# Patient Record
Sex: Male | Born: 1935 | Hispanic: Yes | Marital: Married | Smoking: Never smoker
Health system: Southern US, Community
[De-identification: ages and names within clinical notes are randomized; demographics above are authoritative.]

## PROBLEM LIST (undated history)

## (undated) DIAGNOSIS — I509 Heart failure, unspecified: Secondary | ICD-10-CM

## (undated) DIAGNOSIS — N189 Chronic kidney disease, unspecified: Secondary | ICD-10-CM

## (undated) DIAGNOSIS — E871 Hypo-osmolality and hyponatremia: Secondary | ICD-10-CM

## (undated) DIAGNOSIS — I10 Essential (primary) hypertension: Secondary | ICD-10-CM

## (undated) DIAGNOSIS — I639 Cerebral infarction, unspecified: Secondary | ICD-10-CM

## (undated) HISTORY — PX: CARDIAC SURGERY: SHX584

## (undated) HISTORY — PX: BRAIN SURGERY: SHX531

## (undated) HISTORY — DX: Heart failure, unspecified: I50.9

## (undated) HISTORY — DX: Chronic kidney disease, unspecified: N18.9

## (undated) HISTORY — DX: Hypo-osmolality and hyponatremia: E87.1

---

## 2004-12-16 ENCOUNTER — Emergency Department: Payer: Self-pay | Admitting: Emergency Medicine

## 2005-06-05 ENCOUNTER — Emergency Department: Payer: Self-pay | Admitting: Emergency Medicine

## 2007-07-04 ENCOUNTER — Inpatient Hospital Stay: Payer: Self-pay | Admitting: Internal Medicine

## 2007-07-04 ENCOUNTER — Other Ambulatory Visit: Payer: Self-pay

## 2007-08-23 ENCOUNTER — Encounter: Payer: Self-pay | Admitting: Nurse Practitioner

## 2007-09-06 ENCOUNTER — Encounter: Payer: Self-pay | Admitting: Nurse Practitioner

## 2007-10-07 ENCOUNTER — Encounter: Payer: Self-pay | Admitting: Nurse Practitioner

## 2008-01-18 ENCOUNTER — Inpatient Hospital Stay: Payer: Self-pay | Admitting: Specialist

## 2008-01-18 ENCOUNTER — Other Ambulatory Visit: Payer: Self-pay

## 2008-03-29 ENCOUNTER — Ambulatory Visit: Payer: Self-pay | Admitting: Vascular Surgery

## 2008-04-04 ENCOUNTER — Inpatient Hospital Stay: Payer: Self-pay | Admitting: Vascular Surgery

## 2008-09-30 IMAGING — CR DG CHEST 2V
1 series · 2 of 2 positions shown · non-contrast
Comparison: none

REASON FOR EXAM: Hypoxia
COMMENTS:

[Series 1: view not recorded · 0.17mm/px · 2 of 2 slices shown]
[im 1/2]
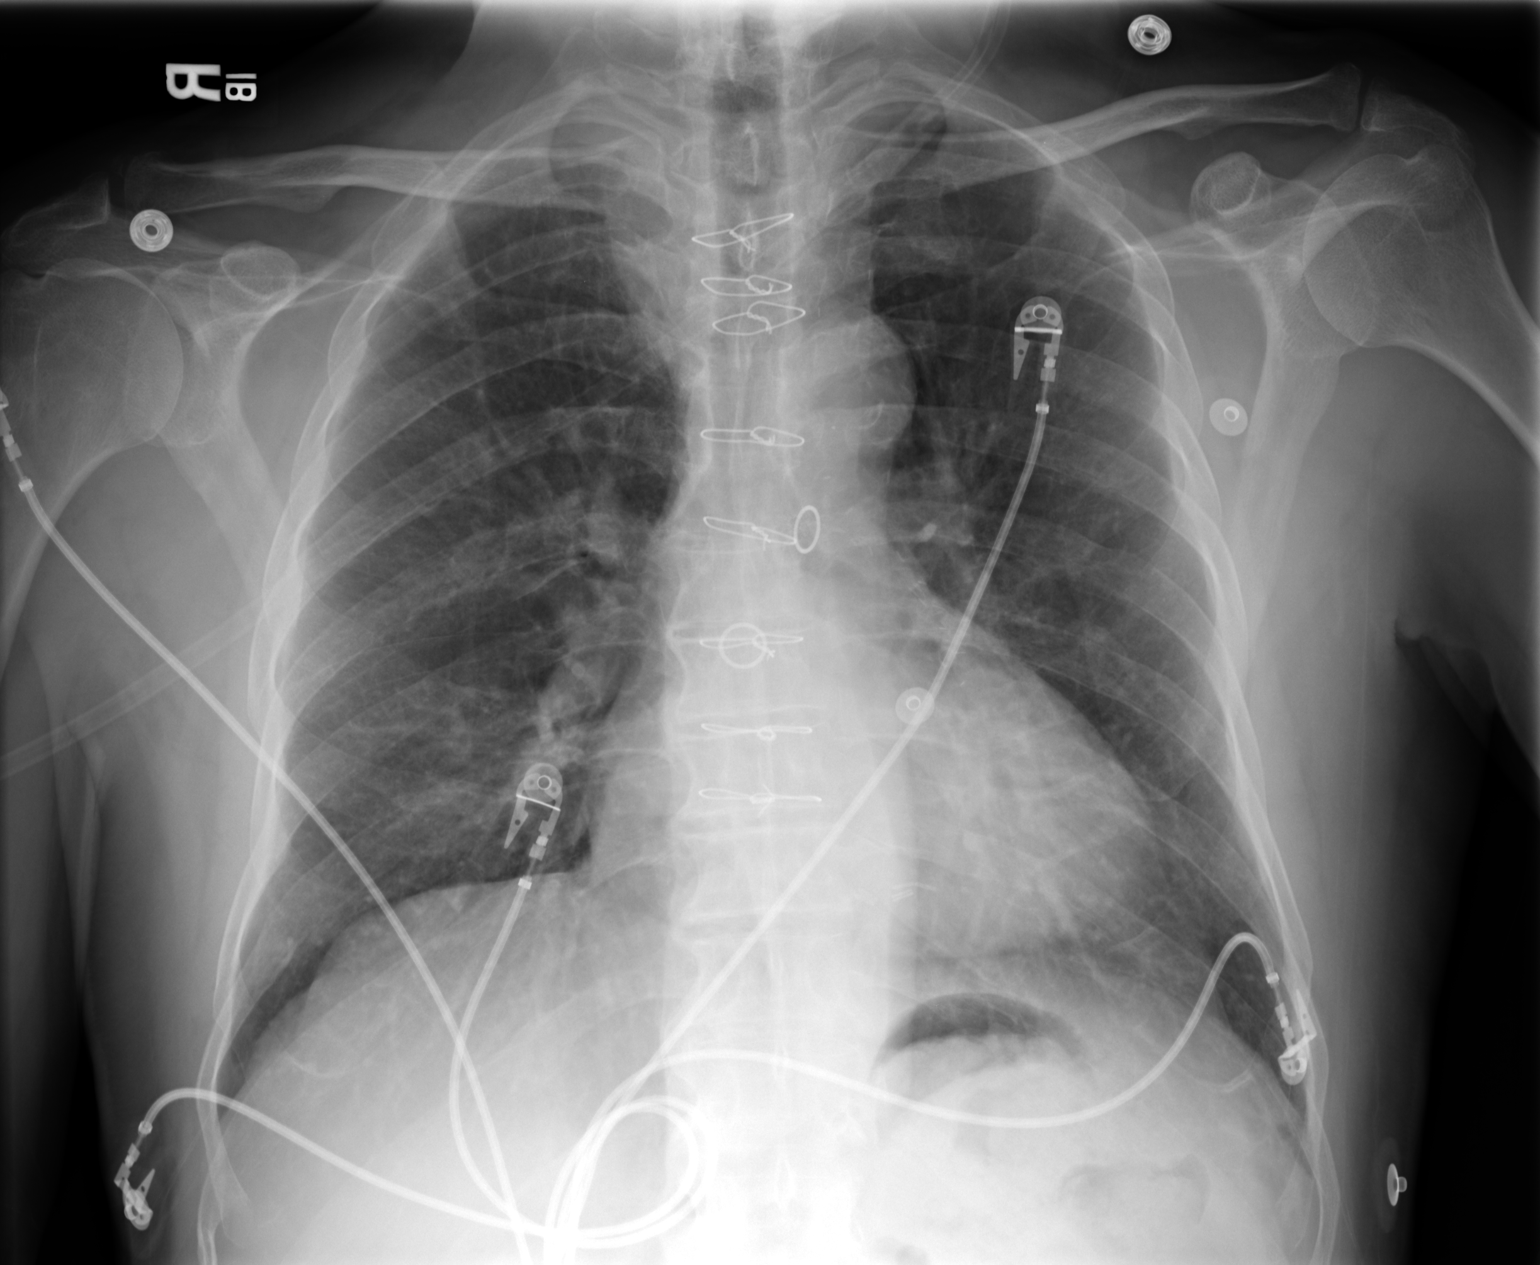
[im 2/2]
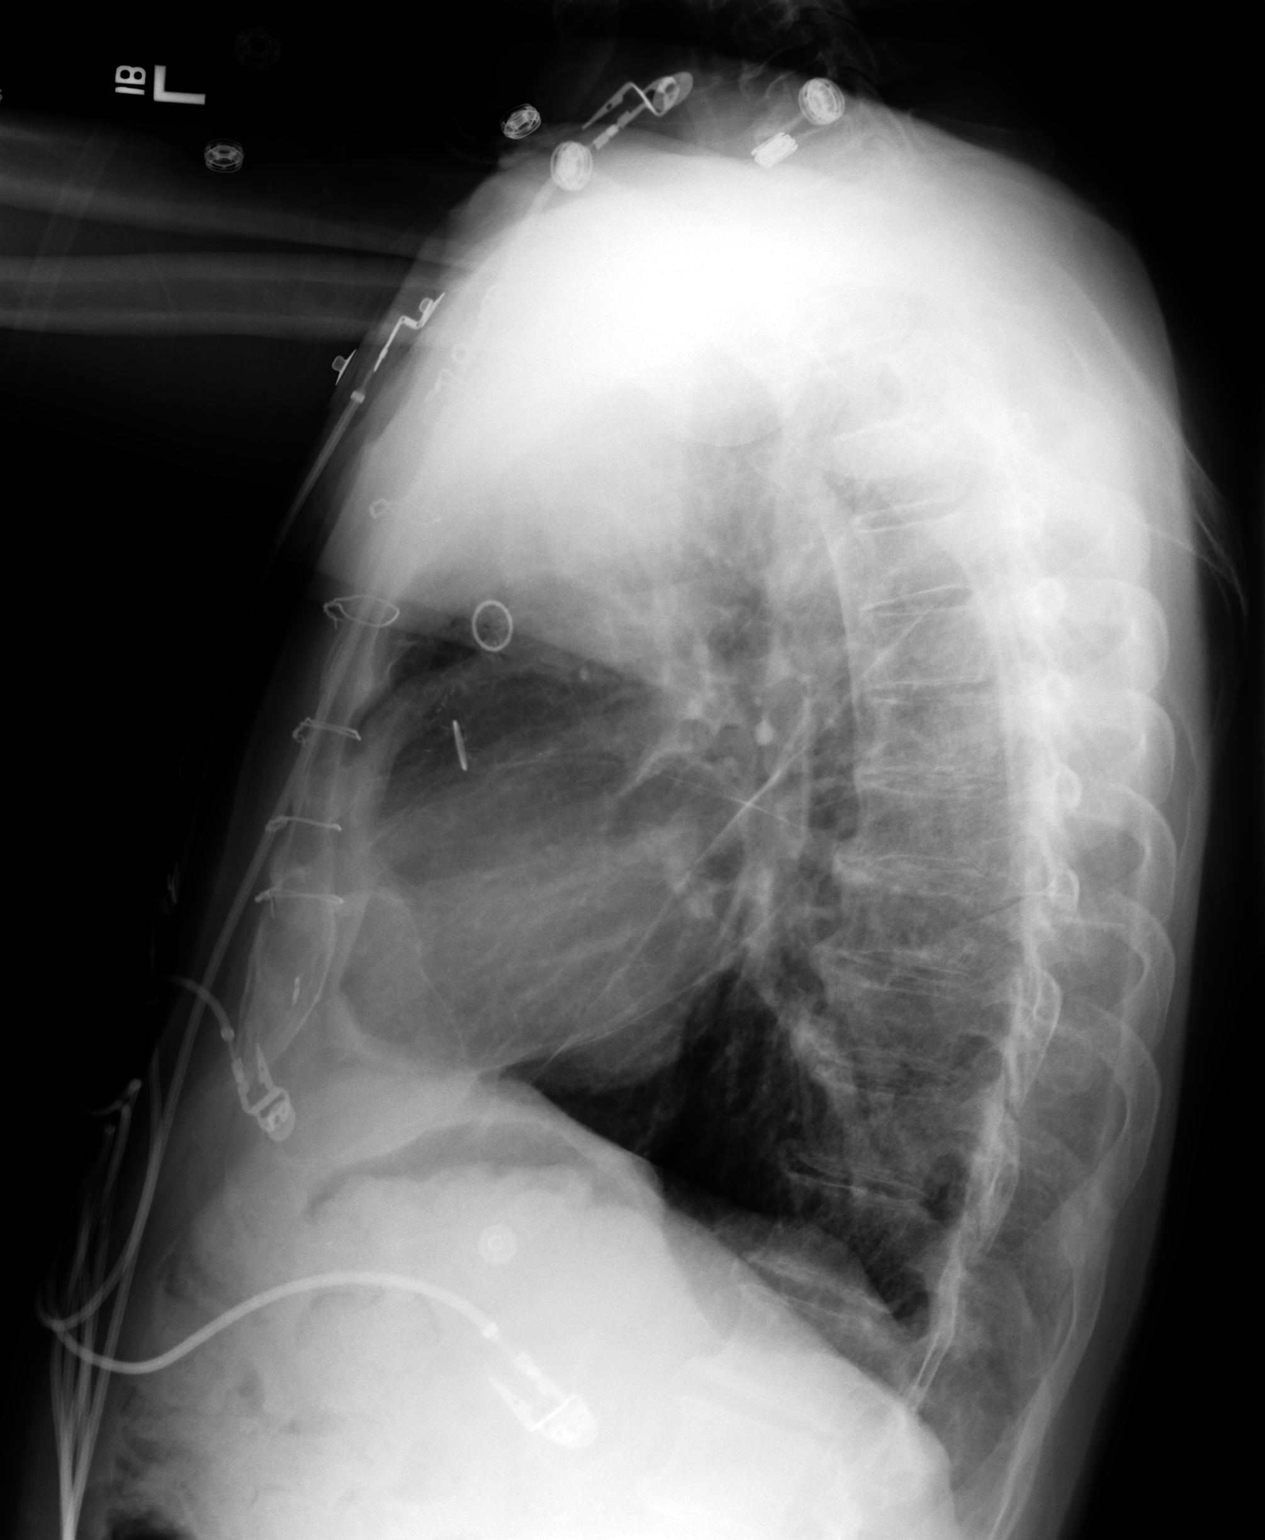

[2 of 2 positions shown; findings below may reference images not displayed]

PROCEDURE:     DXR - DXR CHEST PA (OR AP) AND LATERAL  - April 06, 2008  [DATE]

RESULT:     Comparison is made to a chest x-ray of 04/05/2008.

CABG changes are present. Cardiac monitoring electrodes are present. There
is no infiltrate, edema or effusion. There is no pneumothorax. There is
hyperinflation suggestive of COPD.
IMPRESSION: COPD. No acute cardiopulmonary disease.

## 2015-08-01 ENCOUNTER — Emergency Department
Admission: EM | Admit: 2015-08-01 | Discharge: 2015-08-01 | Disposition: A | Discharge status: Home / Self Care | Payer: Medicare Other | Attending: Emergency Medicine | Admitting: Emergency Medicine

## 2015-08-01 ENCOUNTER — Encounter: Payer: Self-pay | Admitting: *Deleted

## 2015-08-01 DIAGNOSIS — Z79899 Other long term (current) drug therapy: Secondary | ICD-10-CM | POA: Diagnosis not present

## 2015-08-01 DIAGNOSIS — F329 Major depressive disorder, single episode, unspecified: Secondary | ICD-10-CM | POA: Diagnosis not present

## 2015-08-01 DIAGNOSIS — I2510 Atherosclerotic heart disease of native coronary artery without angina pectoris: Secondary | ICD-10-CM

## 2015-08-01 DIAGNOSIS — Z599 Problem related to housing and economic circumstances, unspecified: Secondary | ICD-10-CM

## 2015-08-01 DIAGNOSIS — F419 Anxiety disorder, unspecified: Secondary | ICD-10-CM

## 2015-08-01 DIAGNOSIS — F32A Depression, unspecified: Secondary | ICD-10-CM

## 2015-08-01 DIAGNOSIS — F321 Major depressive disorder, single episode, moderate: Secondary | ICD-10-CM | POA: Diagnosis not present

## 2015-08-01 DIAGNOSIS — Z598 Other problems related to housing and economic circumstances: Secondary | ICD-10-CM

## 2015-08-01 DIAGNOSIS — I10 Essential (primary) hypertension: Secondary | ICD-10-CM | POA: Insufficient documentation

## 2015-08-01 DIAGNOSIS — R45851 Suicidal ideations: Secondary | ICD-10-CM | POA: Diagnosis present

## 2015-08-01 HISTORY — DX: Cerebral infarction, unspecified: I63.9

## 2015-08-01 HISTORY — DX: Essential (primary) hypertension: I10

## 2015-08-01 LAB — URINE DRUG SCREEN, QUALITATIVE (ARMC ONLY)
Amphetamines, Ur Screen: NOT DETECTED
BARBITURATES, UR SCREEN: NOT DETECTED
BENZODIAZEPINE, UR SCRN: NOT DETECTED
CANNABINOID 50 NG, UR ~~LOC~~: NOT DETECTED
Cocaine Metabolite,Ur ~~LOC~~: NOT DETECTED
MDMA (Ecstasy)Ur Screen: NOT DETECTED
Methadone Scn, Ur: NOT DETECTED
Opiate, Ur Screen: NOT DETECTED
Phencyclidine (PCP) Ur S: NOT DETECTED
TRICYCLIC, UR SCREEN: NOT DETECTED

## 2015-08-01 LAB — COMPREHENSIVE METABOLIC PANEL
ALT: 24 U/L (ref 17–63)
AST: 29 U/L (ref 15–41)
Albumin: 3.6 g/dL (ref 3.5–5.0)
Alkaline Phosphatase: 93 U/L (ref 38–126)
Anion gap: 8 (ref 5–15)
BUN: 12 mg/dL (ref 6–20)
CALCIUM: 8.5 mg/dL — AB (ref 8.9–10.3)
CHLORIDE: 105 mmol/L (ref 101–111)
CO2: 26 mmol/L (ref 22–32)
CREATININE: 0.96 mg/dL (ref 0.61–1.24)
Glucose, Bld: 168 mg/dL — ABNORMAL HIGH (ref 65–99)
Potassium: 4 mmol/L (ref 3.5–5.1)
Sodium: 139 mmol/L (ref 135–145)
TOTAL PROTEIN: 6.9 g/dL (ref 6.5–8.1)
Total Bilirubin: 0.6 mg/dL (ref 0.3–1.2)

## 2015-08-01 LAB — SALICYLATE LEVEL

## 2015-08-01 LAB — CBC
HCT: 41.7 % (ref 40.0–52.0)
Hemoglobin: 13.8 g/dL (ref 13.0–18.0)
MCH: 28.5 pg (ref 26.0–34.0)
MCHC: 33 g/dL (ref 32.0–36.0)
MCV: 86.2 fL (ref 80.0–100.0)
PLATELETS: 164 10*3/uL (ref 150–440)
RBC: 4.84 MIL/uL (ref 4.40–5.90)
RDW: 13.9 % (ref 11.5–14.5)
WBC: 6.6 10*3/uL (ref 3.8–10.6)

## 2015-08-01 LAB — ACETAMINOPHEN LEVEL: Acetaminophen (Tylenol), Serum: <10 ug/mL — ABNORMAL LOW (ref 10–30)

## 2015-08-01 LAB — ETHANOL

## 2015-08-01 MED ORDER — MIRTAZAPINE 15 MG PO TABS
15.0000 mg | ORAL_TABLET | Freq: Every day | ORAL | Status: DC
Start: 2015-08-01 — End: 2021-03-11

## 2015-08-01 NOTE — ED Notes (Signed)
Patient with no complaints at this time. Respirations even and unlabored. Skin warm/dry. Discharge instructions reviewed with patient at this time. Patient given opportunity to voice concerns/ask questions. Patient discharged at this time and left Emergency Department with steady gait.   

## 2015-08-01 NOTE — ED Provider Notes (Signed)
[facility] Emergency Department Provider Note  ____________________________________________  Time seen: Approximately 340 PM  I have reviewed the triage vital signs and the nursing notes.   HISTORY  Chief Complaint Suicidal  Patient is Spanish speaking only. Interpreter used.  HPI Daniel Bolton is a 81 y.o. male history of stroke, CAD and hypertension who is presenting today for anxiety and depression.He states that the root of his depression is that he doesn't think he has enough money. He also says that his family does not check in on him very much. He denies any hallucinations. When asked about his suicidality he gives a long description of his anxiety but does not address the suicidality directly. He denies any illicit drug use or drinking. He says that he also has trouble sleeping .  Past Medical History  Diagnosis Date  . Stroke ([place])   . Hypertension     Patient Active Problem List   Diagnosis Date Noted  . Moderate major depression, single episode ([place]) 08/01/2015  . Coronary artery disease 08/01/2015  . Financial problems 08/01/2015    Past Surgical History  Procedure Laterality Date  . Cardiac surgery      Current Outpatient Rx  Name  Route  Sig  Dispense  Refill  . mirtazapine (REMERON) 15 MG tablet   Oral   Take 1 tablet (15 mg total) by mouth at bedtime.   30 tablet   0     Allergies Review of patient's allergies indicates no known allergies.  No family history on file.  Social History Social History  Substance Use Topics  . Smoking status: Never Smoker   . Smokeless tobacco: None  . Alcohol Use: No    Review of Systems Constitutional: No fever/chills Eyes: No visual changes. ENT: No sore throat. Cardiovascular: Denies chest pain. Respiratory: Denies shortness of breath. Gastrointestinal: No abdominal pain.  No nausea, no vomiting.  No diarrhea.  No constipation. Genitourinary: Negative for  dysuria. Musculoskeletal: Negative for back pain. Skin: Negative for rash. Neurological: Negative for headaches, focal weakness or numbness.  10-point ROS otherwise negative.  ____________________________________________   PHYSICAL EXAM:  VITAL SIGNS: ED Triage Vitals  Enc Vitals Group     BP 08/01/15 1453 139/62 mmHg     Pulse Rate 08/01/15 1453 61     Resp 08/01/15 1453 16     Temp 08/01/15 1453 97.8 F (36.6 C)     Temp Source 08/01/15 1453 Oral     SpO2 08/01/15 1453 97 %     Weight --      Height --      Head Cir --      Peak Flow --      Pain Score --      Pain Loc --      Pain Edu? --      Excl. in [place]? --     Constitutional: Alert and oriented. Well appearing and in no acute distress. Eyes: Conjunctivae are normal. PERRL. EOMI. Head: Atraumatic. Nose: No congestion/rhinnorhea. Mouth/Throat: Mucous membranes are moist.  Oropharynx non-erythematous. Neck: No stridor.   Cardiovascular: Normal rate, regular rhythm. Grossly normal heart sounds.  Good peripheral circulation. Respiratory: Normal respiratory effort.  No retractions. Lungs CTAB. Gastrointestinal: Soft and nontender. No distention. No abdominal bruits. No CVA tenderness. Musculoskeletal: No lower extremity tenderness nor edema.  No joint effusions. Neurologic:  Normal speech and language. No gross focal neurologic deficits are appreciated. No gait instability. Skin:  Skin is warm, dry and  intact. No rash noted. Psychiatric: Mood and affect are normal. Speech and behavior are normal.  ____________________________________________   LABS (all labs ordered are listed, but only abnormal results are displayed)  Labs Reviewed  COMPREHENSIVE METABOLIC PANEL - Abnormal; Notable for the following:    Glucose, Bld 168 (*)    Calcium 8.5 (*)    All other components within normal limits  ACETAMINOPHEN LEVEL - Abnormal; Notable for the following:    Acetaminophen (Tylenol), Serum <10 (*)    All other  components within normal limits  ETHANOL  SALICYLATE LEVEL  CBC  URINE DRUG SCREEN, QUALITATIVE ([facility] ONLY)   ____________________________________________  EKG   ____________________________________________  RADIOLOGY   ____________________________________________   PROCEDURES   ____________________________________________   INITIAL IMPRESSION / ASSESSMENT AND PLAN / ED COURSE  Pertinent labs & imaging results that were available during my care of the patient were reviewed by me and considered in my medical decision making (see chart for details).  ----------------------------------------- 7:30 PM on 08/01/2015 -----------------------------------------  Patient has been seen and evaluated by the psychiatrist. The patient has no suicidal intent or homicidal intent at this time. He remains calm and without any acute psychosis. He'll be given a prescription for mirtazapine and follow-up with [facility]. Patient understands the plan and is one to comply. ____________________________________________   FINAL CLINICAL IMPRESSION(S) / ED DIAGNOSES  Anxiety and depression.    Orbie Pyo, MD 08/01/15 [phone]

## 2015-08-01 NOTE — ED Notes (Signed)

## 2015-08-01 NOTE — Consult Note (Signed)
[facility] Face-to-Face Psychiatry Consult   Reason for Consult:  Consult requested for this 81 year old man who came to the emergency room stating that he was very depressed Referring Physician:  Clearnce Hasten Patient Identification: Daniel Bolton MRN:  [id number] Principal Diagnosis: Moderate major depression, single episode [facility]) Diagnosis:   Patient Active Problem List   Diagnosis Date Noted  . Moderate major depression, single episode ([place]) [F32.1] 08/01/2015  . Coronary artery disease [I25.10] 08/01/2015  . Financial problems [Z59.8] 08/01/2015    Total Time spent with patient: 1 hour  Subjective:   Daniel Bolton is a 81 y.o. male patient admitted with "I am just feeling very bad".  HPI:  Information from the patient and the chart. Patient was interviewed with the assistance of a hospital employed Daniel Bolton language interpreter. Chart reviewed. Labs reviewed. Vital signs reviewed. This 81 year old man came to the emergency room stating that he was feeling depressed. He says he's been feeling down for about 15 days. He is sleeping poorly at night waking up frequently. Appetite has been normal. He is having trouble concentrating and not enjoying his usual daytime activities. He says he has had thoughts about killing himself but has not had any plan or intention and has never had any serious intent to harm himself. His primary stress is that he owes quite a bit of money and feels that it will be impossible for him to pay it. He doesn't go into the details of it but he seems very overwhelmed by this debt. He is not getting any psychiatric help right now. Daniel Bolton social history  Social history: Patient lives by himself. Speaks little or no [place]. Not currently employed. He says he has children who live nearby but apparently he has not seen fit to talk with them about his problems  Medical history: History of coronary artery disease. Status post bypass operation. Takes medicine for high blood  pressure and cholesterol.  Substance abuse history: Patient denies any use of alcohol or drugs area drank until about 20 years ago but never drank abusively.  Family history: Says that his father was also very depressed and nervous.  Past Psychiatric History: No past psychiatric history. Never been in a psychiatric hospital. Never been prescribed any medicine for psychiatric illness never seen a psychiatric provider. Doesn't describe previous episodes of depression  Risk to Self: Is patient at risk for suicide?: Yes Risk to Others:   Prior Inpatient Therapy:   Prior Outpatient Therapy:    Past Medical History:  Past Medical History  Diagnosis Date  . Stroke ([place])   . Hypertension     Past Surgical History  Procedure Laterality Date  . Cardiac surgery     Family History: No family history on file. Family Psychiatric  History: As noted above patient says that his father had depression as well does not report a family history of suicide Social History:  History  Alcohol Use No     History  Drug Use No    Social History   Social History  . Marital Status: Married    Spouse Name: N/A  . Number of Children: N/A  . Years of Education: N/A   Social History Main Topics  . Smoking status: Never Smoker   . Smokeless tobacco: None  . Alcohol Use: No  . Drug Use: No  . Sexual Activity: Not Asked   Other Topics Concern  . None   Social History Narrative  . None   Additional Social History:  Allergies:  No Known Allergies  Labs:  Results for orders placed or performed during the hospital encounter of 08/01/15 (from the past 48 hour(s))  Comprehensive metabolic panel     Status: Abnormal   Collection Time: 08/01/15  2:56 PM  Result Value Ref Range   Sodium 139 135 - 145 mmol/L   Potassium 4.0 3.5 - 5.1 mmol/L   Chloride 105 101 - 111 mmol/L   CO2 26 22 - 32 mmol/L   Glucose, Bld 168 (H) 65 - 99 mg/dL   BUN 12 6 - 20 mg/dL    Creatinine, Ser 0.96 0.61 - 1.24 mg/dL   Calcium 8.5 (L) 8.9 - 10.3 mg/dL   Total Protein 6.9 6.5 - 8.1 g/dL   Albumin 3.6 3.5 - 5.0 g/dL   AST 29 15 - 41 U/L   ALT 24 17 - 63 U/L   Alkaline Phosphatase 93 38 - 126 U/L   Total Bilirubin 0.6 0.3 - 1.2 mg/dL   GFR calc non Af Amer >60 >60 mL/min   GFR calc Af Amer >60 >60 mL/min    Comment: (NOTE) The eGFR has been calculated using the CKD EPI equation. This calculation has not been validated in all clinical situations. eGFR's persistently <60 mL/min signify possible Chronic Kidney Disease.    Anion gap 8 5 - 15  Ethanol (ETOH)     Status: None   Collection Time: 08/01/15  2:56 PM  Result Value Ref Range   Alcohol, Ethyl (B) <5 <5 mg/dL    Comment:        LOWEST DETECTABLE LIMIT FOR SERUM ALCOHOL IS 5 mg/dL FOR MEDICAL PURPOSES ONLY   Salicylate level     Status: None   Collection Time: 08/01/15  2:56 PM  Result Value Ref Range   Salicylate Lvl <5.4 2.8 - 30.0 mg/dL  Acetaminophen level     Status: Abnormal   Collection Time: 08/01/15  2:56 PM  Result Value Ref Range   Acetaminophen (Tylenol), Serum <10 (L) 10 - 30 ug/mL    Comment:        THERAPEUTIC CONCENTRATIONS VARY SIGNIFICANTLY. A RANGE OF 10-30 ug/mL MAY BE AN EFFECTIVE CONCENTRATION FOR MANY PATIENTS. HOWEVER, SOME ARE BEST TREATED AT CONCENTRATIONS OUTSIDE THIS RANGE. ACETAMINOPHEN CONCENTRATIONS >150 ug/mL AT 4 HOURS AFTER INGESTION AND >50 ug/mL AT 12 HOURS AFTER INGESTION ARE OFTEN ASSOCIATED WITH TOXIC REACTIONS.   CBC     Status: None   Collection Time: 08/01/15  2:56 PM  Result Value Ref Range   WBC 6.6 3.8 - 10.6 K/uL   RBC 4.84 4.40 - 5.90 MIL/uL   Hemoglobin 13.8 13.0 - 18.0 g/dL   HCT 41.7 40.0 - 52.0 %   MCV 86.2 80.0 - 100.0 fL   MCH 28.5 26.0 - 34.0 pg   MCHC 33.0 32.0 - 36.0 g/dL   RDW 13.9 11.5 - 14.5 %   Platelets 164 150 - 440 K/uL  Urine Drug Screen, Qualitative ([facility] only)     Status: None   Collection Time: 08/01/15  2:56 PM   Result Value Ref Range   Tricyclic, Ur Screen NONE DETECTED NONE DETECTED   Amphetamines, Ur Screen NONE DETECTED NONE DETECTED   MDMA (Ecstasy)Ur Screen NONE DETECTED NONE DETECTED   Cocaine Metabolite,Ur [place] NONE DETECTED NONE DETECTED   Opiate, Ur Screen NONE DETECTED NONE DETECTED   Phencyclidine (PCP) Ur S NONE DETECTED NONE DETECTED   Cannabinoid 50 Ng, Ur [place] NONE DETECTED NONE DETECTED   Barbiturates,  Ur Screen NONE DETECTED NONE DETECTED   Benzodiazepine, Ur Scrn NONE DETECTED NONE DETECTED   Methadone Scn, Ur NONE DETECTED NONE DETECTED    Comment: (NOTE) 387  Tricyclics, urine               Cutoff 1000 ng/mL 200  Amphetamines, urine             Cutoff 1000 ng/mL 300  MDMA (Ecstasy), urine           Cutoff 500 ng/mL 400  Cocaine Metabolite, urine       Cutoff 300 ng/mL 500  Opiate, urine                   Cutoff 300 ng/mL 600  Phencyclidine (PCP), urine      Cutoff 25 ng/mL 700  Cannabinoid, urine              Cutoff 50 ng/mL 800  Barbiturates, urine             Cutoff 200 ng/mL 900  Benzodiazepine, urine           Cutoff 200 ng/mL 1000 Methadone, urine                Cutoff 300 ng/mL 1100 1200 The urine drug screen provides only a preliminary, unconfirmed 1300 analytical test result and should not be used for non-medical 1400 purposes. Clinical consideration and professional judgment should 1500 be applied to any positive drug screen result due to possible 1600 interfering substances. A more specific alternate chemical method 1700 must be used in order to obtain a confirmed analytical result.  1800 Gas chromato graphy / mass spectrometry (GC/MS) is the preferred 1900 confirmatory method.     No current facility-administered medications for this encounter.   Current Outpatient Prescriptions  Medication Sig Dispense Refill  . mirtazapine (REMERON) 15 MG tablet Take 1 tablet (15 mg total) by mouth at bedtime. 30 tablet 0    Musculoskeletal: Strength & Muscle Tone:  within normal limits Gait & Station: normal Patient leans: N/A  Psychiatric Specialty Exam: Review of Systems  Constitutional: Negative.   HENT: Negative.   Eyes: Negative.   Respiratory: Negative.   Cardiovascular: Negative.   Gastrointestinal: Negative.   Musculoskeletal: Negative.   Skin: Negative.   Neurological: Negative.   Psychiatric/Behavioral: Positive for depression and suicidal ideas. Negative for hallucinations, memory loss and substance abuse. The patient is nervous/anxious and has insomnia.     Blood pressure 139/62, pulse 61, temperature 97.8 F (36.6 C), temperature source Oral, resp. rate 16, SpO2 97 %.There is no height or weight on file to calculate BMI.  General Appearance: Disheveled  Eye Sport and exercise psychologist::  Fair  Speech:  Normal Rate  Volume:  Normal  Mood:  Dysphoric  Affect:  Anxious  Thought Process:  Goal Directed  Orientation:  Full (Time, Place, and Person)  Thought Content:  Negative  Suicidal Thoughts:  Yes.  without intent/plan  Homicidal Thoughts:  No  Memory:  Immediate;   Fair Recent;   Fair Remote;   Fair  Judgement:  Fair  Insight:  Fair  Psychomotor Activity:  Normal  Concentration:  Fair  Recall:  [employer] of Knowledge:Fair  Language: Fair  Akathisia:  No  Handed:  Right  AIMS (if indicated):     Assets:  Communication Skills Desire for Improvement Housing  ADL's:  Intact  Cognition: WNL  Sleep:      Treatment Plan Summary: Medication management and Plan 81 year old  man who presents with multiple symptoms of depression for the last 2 weeks. Suicidal thoughts but is very clear that he has no intention or plan of acting on them. No sign of psychosis. Symptoms could be a major depression or could be more of a adjustment disorder. In any case patient does not appear to meet commitment criteria and does not require inpatient hospitalization. I proposed to him that I give him a prescription for mirtazapine 15 mg at night to help with his  sleep and mood. Patient was educated to stay on the medicine regularly. Furthermore he will be referred to a local mental health provider where he can get assistance in the Spanish language. Patient expressed understanding of the plan. Encouragement and supportive counseling completed. Case reviewed with emergency room provider.  Disposition: No evidence of imminent risk to self or others at present.   Patient does not meet criteria for psychiatric inpatient admission. Supportive therapy provided about ongoing stressors. Discussed crisis plan, support from social network, calling 911, coming to the Emergency Department, and calling Suicide Hotline.  Kynli Chou 08/01/2015 7:25 PM

## 2015-08-01 NOTE — ED Notes (Signed)
Assessment completed with interpreter present

## 2015-08-01 NOTE — ED Notes (Signed)
BEHAVIORAL HEALTH ROUNDING Patient sleeping: No. Patient alert and oriented: yes Behavior appropriate: Yes.  ; If no, describe:  Nutrition and fluids offered: yes Toileting and hygiene offered: Yes  Sitter present: q15 minute observations and security camera monitoring Law enforcement present: Yes  ODS  

## 2015-08-01 NOTE — ED Notes (Signed)
Interpreter paged.

## 2015-08-01 NOTE — ED Notes (Signed)
md Clapacs is consulting at this time  [place] interpreter is at the bedside also

## 2015-08-01 NOTE — ED Notes (Signed)
Supper provided along with an extra drink  Pt observed with no unusual behavior  Appropriate to stimulation  No verbalized needs or concerns at this time  NAD assessed  Continue to monitor 

## 2015-08-01 NOTE — ED Notes (Signed)
Pt reports depression, sad for several days, possibly because he owes money and doesn't have money to pay back to bank. Has had suicidal thoughts, but denies plan at this time.

## 2015-08-01 NOTE — ED Notes (Signed)
He has ambulated to the BR

## 2016-12-28 ENCOUNTER — Emergency Department: Payer: Medicare Other

## 2016-12-28 ENCOUNTER — Emergency Department
Admission: EM | Admit: 2016-12-28 | Discharge: 2016-12-29 | Disposition: A | Discharge status: Home / Self Care | Payer: Medicare Other | Attending: Emergency Medicine | Admitting: Emergency Medicine

## 2016-12-28 ENCOUNTER — Encounter: Payer: Self-pay | Admitting: Emergency Medicine

## 2016-12-28 DIAGNOSIS — J111 Influenza due to unidentified influenza virus with other respiratory manifestations: Secondary | ICD-10-CM | POA: Diagnosis not present

## 2016-12-28 DIAGNOSIS — I10 Essential (primary) hypertension: Secondary | ICD-10-CM | POA: Insufficient documentation

## 2016-12-28 DIAGNOSIS — J189 Pneumonia, unspecified organism: Secondary | ICD-10-CM | POA: Insufficient documentation

## 2016-12-28 DIAGNOSIS — I2510 Atherosclerotic heart disease of native coronary artery without angina pectoris: Secondary | ICD-10-CM | POA: Insufficient documentation

## 2016-12-28 DIAGNOSIS — Z79899 Other long term (current) drug therapy: Secondary | ICD-10-CM | POA: Insufficient documentation

## 2016-12-28 DIAGNOSIS — R509 Fever, unspecified: Secondary | ICD-10-CM

## 2016-12-28 LAB — CBC WITH DIFFERENTIAL/PLATELET
Basophils Absolute: 0 10*3/uL (ref 0–0.1)
Basophils Relative: 0 %
Eosinophils Absolute: 0 10*3/uL (ref 0–0.7)
Eosinophils Relative: 0 %
HEMATOCRIT: 40.6 % (ref 40.0–52.0)
Hemoglobin: 13.7 g/dL (ref 13.0–18.0)
Lymphocytes Relative: 11 %
Lymphs Abs: 1.1 10*3/uL (ref 1.0–3.6)
MCH: 28.2 pg (ref 26.0–34.0)
MCHC: 33.8 g/dL (ref 32.0–36.0)
MCV: 83.4 fL (ref 80.0–100.0)
MONO ABS: 0.7 10*3/uL (ref 0.2–1.0)
MONOS PCT: 7 %
NEUTROS ABS: 8.1 10*3/uL — AB (ref 1.4–6.5)
Neutrophils Relative %: 82 %
Platelets: 175 10*3/uL (ref 150–440)
RBC: 4.87 MIL/uL (ref 4.40–5.90)
RDW: 14.4 % (ref 11.5–14.5)
WBC: 9.9 10*3/uL (ref 3.8–10.6)

## 2016-12-28 LAB — LACTIC ACID, PLASMA: Lactic Acid, Venous: 1.9 mmol/L (ref 0.5–1.9)

## 2016-12-28 MED ORDER — IPRATROPIUM-ALBUTEROL 0.5-2.5 (3) MG/3ML IN SOLN
3.0000 mL | Freq: Once | RESPIRATORY_TRACT | Status: AC
  Administered 2016-12-28: 3 mL via RESPIRATORY_TRACT
  Filled 2016-12-28: qty 3

## 2016-12-28 MED ORDER — ACETAMINOPHEN 500 MG PO TABS
1000.0000 mg | ORAL_TABLET | Freq: Once | ORAL | Status: AC
  Administered 2016-12-28: 1000 mg via ORAL
  Filled 2016-12-28: qty 2

## 2016-12-28 MED ORDER — HYDROCOD POLST-CPM POLST ER 10-8 MG/5ML PO SUER
5.0000 mL | Freq: Once | ORAL | Status: AC
  Administered 2016-12-28: 5 mL via ORAL
  Filled 2016-12-28: qty 5

## 2016-12-28 NOTE — ED Provider Notes (Signed)
[facility] Emergency Department Provider Note   ____________________________________________   First MD Initiated Contact with Patient 12/28/16 2318     (approximate)  I have reviewed the triage vital signs and the nursing notes.   HISTORY  Chief Complaint Fever  History obtained via Spanish interpreter  HPI Daniel Bolton is a 83 y.o. male brought to the ED from home via EMS with a chief complaint of fever. Patient reports a one-day history of fever, chills, myalgias, cough, posttussive emesis. Family member had flu earlier in the week. Denies associated headache, neck pain, chest pain, shortness of breath, abdominal pain, diarrhea. Denies recent travel or trauma. Nothing makes his symptoms better or worse.   Past Medical History:  Diagnosis Date  . Hypertension   . Stroke [facility])     Patient Active Problem List   Diagnosis Date Noted  . Moderate major depression, single episode ([place]) 08/01/2015  . Coronary artery disease 08/01/2015  . Financial problems 08/01/2015    Past Surgical History:  Procedure Laterality Date  . CARDIAC SURGERY      Prior to Admission medications   Medication Sig Start Date End Date Taking? Authorizing Provider  amoxicillin (AMOXIL) 500 MG capsule Take 1 capsule (500 mg total) by mouth 3 (three) times daily. 12/29/16   Paulette Blanch, MD  azithromycin (ZITHROMAX) 250 MG tablet Take 1 tablet (250 mg total) by mouth daily. 12/29/16   Paulette Blanch, MD  chlorpheniramine-HYDROcodone (TUSSIONEX PENNKINETIC ER) 10-8 MG/5ML SUER Take 5 mLs by mouth 2 (two) times daily. 12/29/16   Paulette Blanch, MD  mirtazapine (REMERON) 15 MG tablet Take 1 tablet (15 mg total) by mouth at bedtime. 08/01/15   Gonzella Lex, MD  oseltamivir (TAMIFLU) 75 MG capsule Take 1 capsule (75 mg total) by mouth 2 (two) times daily. 12/29/16   Paulette Blanch, MD    Allergies Patient has no known allergies.  No family history on file.  Social  History Social History  Substance Use Topics  . Smoking status: Never Smoker  . Smokeless tobacco: Never Used  . Alcohol use No    Review of Systems  Constitutional: Positive for fever/chills. Eyes: No visual changes. ENT: No sore throat. Cardiovascular: Denies chest pain. Respiratory: Positive for cough. Denies shortness of breath. Gastrointestinal: No abdominal pain.  Positive for posttussive emesis.  No diarrhea.  No constipation. Genitourinary: Negative for dysuria. Musculoskeletal: Negative for back pain. Skin: Negative for rash. Neurological: Negative for headaches, focal weakness or numbness.  10-point ROS otherwise negative.  ____________________________________________   PHYSICAL EXAM:  VITAL SIGNS: ED Triage Vitals  Enc Vitals Group     BP 12/28/16 2259 (!) 165/68     Pulse Rate 12/28/16 2259 94     Resp 12/28/16 2259 16     Temp 12/28/16 2259 (!) 101.6 F (38.7 C)     Temp Source 12/28/16 2259 Oral     SpO2 12/28/16 2259 95 %     Weight --      Height --      Head Circumference --      Peak Flow --      Pain Score 12/28/16 2304 0     Pain Loc --      Pain Edu? --      Excl. in [place]? --     Constitutional: Alert and oriented. Weak-appearing and in no acute distress. Eyes: Conjunctivae are normal. PERRL. EOMI. Head: Atraumatic. Nose: No congestion/rhinnorhea. Mouth/Throat: Mucous membranes  are moist.  Oropharynx non-erythematous. Neck: No stridor.  Supple neck without meningismus. Cardiovascular: Normal rate, regular rhythm. Grossly normal heart sounds.  Good peripheral circulation. Respiratory: Normal respiratory effort.  No retractions. Lungs diminished bibasilarly. Gastrointestinal: Soft and nontender. No distention. No abdominal bruits. No CVA tenderness. Musculoskeletal: No lower extremity tenderness nor edema.  No joint effusions. Neurologic:  Normal speech and language. No gross focal neurologic deficits are appreciated. No gait  instability. Skin:  Skin is warm, dry and intact. No rash noted. No petechiae. Psychiatric: Mood and affect are normal. Speech and behavior are normal.  ____________________________________________   LABS (all labs ordered are listed, but only abnormal results are displayed)  Labs Reviewed  CBC WITH DIFFERENTIAL/PLATELET - Abnormal; Notable for the following:       Result Value   Neutro Abs 8.1 (*)    All other components within normal limits  BASIC METABOLIC PANEL - Abnormal; Notable for the following:    Sodium 133 (*)    Glucose, Bld 156 (*)    Creatinine, Ser 1.28 (*)    Calcium 8.4 (*)    GFR calc non Af Amer 51 (*)    GFR calc Af Amer 59 (*)    All other components within normal limits  TROPONIN I - Abnormal; Notable for the following:    Troponin I 0.03 (*)    All other components within normal limits  URINALYSIS, COMPLETE (UACMP) WITH MICROSCOPIC - Abnormal; Notable for the following:    Color, Urine YELLOW (*)    APPearance CLEAR (*)    Hgb urine dipstick SMALL (*)    Protein, ur 100 (*)    Squamous Epithelial / LPF 0-5 (*)    All other components within normal limits  INFLUENZA PANEL BY PCR (TYPE A & B) - Abnormal; Notable for the following:    Influenza A By PCR POSITIVE (*)    All other components within normal limits  CULTURE, BLOOD (ROUTINE X 2)  CULTURE, BLOOD (ROUTINE X 2)  URINE CULTURE  LACTIC ACID, PLASMA  LACTIC ACID, PLASMA   ____________________________________________  EKG  ED ECG REPORT I, Khoen Genet J, the attending physician, personally viewed and interpreted this ECG.   Date: 12/28/2016  EKG Time: 2301  Rate: 94  Rhythm: normal EKG, normal sinus rhythm  Axis: Normal  Intervals:none  ST&T Change: Nonspecific  ____________________________________________  RADIOLOGY  Chest 2 view interpreted per Dr. Radene Knee: Peribronchial thickening noted. Bibasilar airspace opacities raise  question for pneumonia, given the patient's symptoms.  Cardiomegaly.   ____________________________________________   PROCEDURES  Procedure(s) performed: None  Procedures  Critical Care performed: No  ____________________________________________   INITIAL IMPRESSION / ASSESSMENT AND PLAN / ED COURSE  Pertinent labs & imaging results that were available during my care of the patient were reviewed by me and considered in my medical decision making (see chart for details).  83 year old male who presents with fever, body aches, cough and posttussive emesis. Symptoms clinically are consistent with community-acquired pneumonia. Patient does not meet sepsis criteria. Will initiate IV fluid resuscitation, nebulizer treatments, screening labwork including influenza swab, chest x-ray and reassess.  Clinical Course as of Dec 30 614  Mon Dec 29, 2016  0046 IV Rocephin and azithromycin ordered for community-acquired pneumonia seen on chest x-ray. Patient denies TB exposure. Awaiting influenza swab. Noted lactic acid is normal.  [JS]  0345 Patient ambulated with pulse oximeter and remained at 95%. He is not tachypneic. Will recheck temperature and repeat troponin.  [JS]  6568  Patient afebrile. Repeat troponin unchanged. Will discharge home with prescriptions for Tamiflu and antibiotics, and patient will follow-up with his PCP in 2 days. Discussed with patient and family and given strict return precautions. All verbalize understanding and agree with plan of care.  [JS]    Clinical Course User Index [JS] Paulette Blanch, MD     ____________________________________________   FINAL CLINICAL IMPRESSION(S) / ED DIAGNOSES  Final diagnoses:  Fever, unspecified fever cause  Community acquired pneumonia, unspecified laterality  Influenza      NEW MEDICATIONS STARTED DURING THIS VISIT:  New Prescriptions   AMOXICILLIN (AMOXIL) 500 MG CAPSULE    Take 1 capsule (500 mg total) by mouth 3 (three) times daily.   AZITHROMYCIN (ZITHROMAX) 250 MG TABLET     Take 1 tablet (250 mg total) by mouth daily.   CHLORPHENIRAMINE-HYDROCODONE (TUSSIONEX PENNKINETIC ER) 10-8 MG/5ML SUER    Take 5 mLs by mouth 2 (two) times daily.   OSELTAMIVIR (TAMIFLU) 75 MG CAPSULE    Take 1 capsule (75 mg total) by mouth 2 (two) times daily.     Note:  This document was prepared using Dragon voice recognition software and may include unintentional dictation errors.    Paulette Blanch, MD 12/29/16 [phone]

## 2016-12-28 NOTE — ED Triage Notes (Signed)
Fever that started yesterday. Family member had the flu earlier in the week. Febrile. vss otherwise stable

## 2016-12-28 NOTE — ED Notes (Signed)
Interpreter at bedside during triage

## 2016-12-29 DIAGNOSIS — J189 Pneumonia, unspecified organism: Secondary | ICD-10-CM | POA: Diagnosis not present

## 2016-12-29 LAB — URINALYSIS, COMPLETE (UACMP) WITH MICROSCOPIC
Bacteria, UA: NONE SEEN
Bilirubin Urine: NEGATIVE
GLUCOSE, UA: NEGATIVE mg/dL
Ketones, ur: NEGATIVE mg/dL
Leukocytes, UA: NEGATIVE
NITRITE: NEGATIVE
PROTEIN: 100 mg/dL — AB
Specific Gravity, Urine: 1.018 (ref 1.005–1.030)
pH: 5 (ref 5.0–8.0)

## 2016-12-29 LAB — TROPONIN I
Troponin I: 0.03 ng/mL (ref <0.03)
Troponin I: 0.03 ng/mL (ref <0.03)

## 2016-12-29 LAB — BASIC METABOLIC PANEL
Anion gap: 9 (ref 5–15)
BUN: 16 mg/dL (ref 6–20)
CALCIUM: 8.4 mg/dL — AB (ref 8.9–10.3)
CO2: 23 mmol/L (ref 22–32)
CREATININE: 1.28 mg/dL — AB (ref 0.61–1.24)
Chloride: 101 mmol/L (ref 101–111)
GFR calc Af Amer: 59 mL/min — ABNORMAL LOW (ref >60)
GFR calc non Af Amer: 51 mL/min — ABNORMAL LOW (ref >60)
Glucose, Bld: 156 mg/dL — ABNORMAL HIGH (ref 65–99)
Potassium: 3.6 mmol/L (ref 3.5–5.1)
Sodium: 133 mmol/L — ABNORMAL LOW (ref 135–145)

## 2016-12-29 LAB — INFLUENZA PANEL BY PCR (TYPE A & B)
Influenza A By PCR: POSITIVE — AB
Influenza B By PCR: NEGATIVE

## 2016-12-29 MED ORDER — DEXTROSE 5 % IV SOLN: 1.0000 g | Freq: Once | INTRAVENOUS | Status: DC

## 2016-12-29 MED ORDER — IBUPROFEN 600 MG PO TABS
600.0000 mg | ORAL_TABLET | Freq: Once | ORAL | Status: AC
  Administered 2016-12-29: 600 mg via ORAL

## 2016-12-29 MED ORDER — AZITHROMYCIN 500 MG IV SOLR
500.0000 mg | Freq: Once | INTRAVENOUS | Status: AC
  Administered 2016-12-29: 500 mg via INTRAVENOUS
  Filled 2016-12-29: qty 500

## 2016-12-29 MED ORDER — IBUPROFEN 600 MG PO TABS
ORAL_TABLET | ORAL | Status: AC
  Administered 2016-12-29: 600 mg via ORAL
  Filled 2016-12-29: qty 1

## 2016-12-29 MED ORDER — CEFTRIAXONE SODIUM-DEXTROSE 1-3.74 GM-% IV SOLR
1.0000 g | Freq: Once | INTRAVENOUS | Status: AC
  Administered 2016-12-29: 1 g via INTRAVENOUS
  Filled 2016-12-29: qty 50

## 2016-12-29 MED ORDER — HYDROCOD POLST-CPM POLST ER 10-8 MG/5ML PO SUER: 5.0000 mL | Freq: Two times a day (BID) | ORAL | 0 refills | Status: DC

## 2016-12-29 MED ORDER — AZITHROMYCIN 250 MG PO TABS: 250.0000 mg | ORAL_TABLET | Freq: Every day | ORAL | 0 refills | Status: DC

## 2016-12-29 MED ORDER — IBUPROFEN 400 MG PO TABS
200.0000 mg | ORAL_TABLET | Freq: Once | ORAL | Status: AC
  Administered 2016-12-29: 200 mg via ORAL
  Filled 2016-12-29: qty 1

## 2016-12-29 MED ORDER — OSELTAMIVIR PHOSPHATE 75 MG PO CAPS: 75.0000 mg | ORAL_CAPSULE | Freq: Two times a day (BID) | ORAL | 0 refills | Status: DC

## 2016-12-29 MED ORDER — AMOXICILLIN 500 MG PO CAPS: 500.0000 mg | ORAL_CAPSULE | Freq: Three times a day (TID) | ORAL | 0 refills | Status: DC

## 2016-12-29 NOTE — ED Notes (Signed)
Pt. Was walked around in room, pt. O2 sats stayed greater than 94.

## 2016-12-29 NOTE — ED Notes (Signed)
Pt. States he started to have fever, chills and cough that started last night.  Pt. States roommate started having symptoms before him.

## 2016-12-29 NOTE — Discharge Instructions (Signed)
1. Take Tamiflu as prescribed for influenza. 2. Take antibiotics as prescribed (azithromycin 250 mg daily 4 days, amoxicillin 500 mg 3 times daily 7 days). 3. You may take cough medicine as needed (Tussionex). 4. Return to the ER for worsening symptoms, persistent vomiting, difficulty breathing or other concerns.

## 2016-12-29 NOTE — ED Notes (Signed)
Used interpreter on a stick to review d/c instructions with patient and family. Reviewed d/c instructions, follow-up care, and prescriptions with patient. Pt verbalized understanding

## 2016-12-30 LAB — URINE CULTURE: Culture: NO GROWTH

## 2017-01-03 LAB — CULTURE, BLOOD (ROUTINE X 2)
CULTURE: NO GROWTH
CULTURE: NO GROWTH
SPECIAL REQUESTS: ADEQUATE
SPECIAL REQUESTS: ADEQUATE

## 2019-06-24 DIAGNOSIS — N400 Enlarged prostate without lower urinary tract symptoms: Secondary | ICD-10-CM | POA: Diagnosis not present

## 2019-06-24 DIAGNOSIS — H269 Unspecified cataract: Secondary | ICD-10-CM | POA: Diagnosis not present

## 2019-06-24 DIAGNOSIS — N529 Male erectile dysfunction, unspecified: Secondary | ICD-10-CM | POA: Diagnosis not present

## 2019-06-24 DIAGNOSIS — R200 Anesthesia of skin: Secondary | ICD-10-CM | POA: Diagnosis not present

## 2019-06-24 DIAGNOSIS — I10 Essential (primary) hypertension: Secondary | ICD-10-CM | POA: Diagnosis not present

## 2019-06-24 DIAGNOSIS — Z0000 Encounter for general adult medical examination without abnormal findings: Secondary | ICD-10-CM | POA: Diagnosis not present

## 2019-06-24 DIAGNOSIS — E785 Hyperlipidemia, unspecified: Secondary | ICD-10-CM | POA: Diagnosis not present

## 2019-06-24 DIAGNOSIS — I2510 Atherosclerotic heart disease of native coronary artery without angina pectoris: Secondary | ICD-10-CM | POA: Diagnosis not present

## 2019-06-24 DIAGNOSIS — E538 Deficiency of other specified B group vitamins: Secondary | ICD-10-CM | POA: Diagnosis not present

## 2019-08-10 DIAGNOSIS — I6521 Occlusion and stenosis of right carotid artery: Secondary | ICD-10-CM | POA: Diagnosis not present

## 2019-08-10 DIAGNOSIS — I10 Essential (primary) hypertension: Secondary | ICD-10-CM | POA: Diagnosis not present

## 2019-08-10 DIAGNOSIS — I25810 Atherosclerosis of coronary artery bypass graft(s) without angina pectoris: Secondary | ICD-10-CM | POA: Diagnosis not present

## 2019-08-10 DIAGNOSIS — E782 Mixed hyperlipidemia: Secondary | ICD-10-CM | POA: Diagnosis not present

## 2020-01-25 DIAGNOSIS — I25810 Atherosclerosis of coronary artery bypass graft(s) without angina pectoris: Secondary | ICD-10-CM | POA: Diagnosis not present

## 2020-01-25 DIAGNOSIS — Z7902 Long term (current) use of antithrombotics/antiplatelets: Secondary | ICD-10-CM | POA: Diagnosis not present

## 2020-01-25 DIAGNOSIS — E782 Mixed hyperlipidemia: Secondary | ICD-10-CM | POA: Diagnosis not present

## 2020-01-25 DIAGNOSIS — Z79899 Other long term (current) drug therapy: Secondary | ICD-10-CM | POA: Diagnosis not present

## 2020-01-25 DIAGNOSIS — Z87891 Personal history of nicotine dependence: Secondary | ICD-10-CM | POA: Diagnosis not present

## 2020-01-25 DIAGNOSIS — I10 Essential (primary) hypertension: Secondary | ICD-10-CM | POA: Diagnosis not present

## 2020-01-25 DIAGNOSIS — N401 Enlarged prostate with lower urinary tract symptoms: Secondary | ICD-10-CM | POA: Diagnosis not present

## 2020-01-25 DIAGNOSIS — N3943 Post-void dribbling: Secondary | ICD-10-CM | POA: Diagnosis not present

## 2020-01-25 DIAGNOSIS — E538 Deficiency of other specified B group vitamins: Secondary | ICD-10-CM | POA: Diagnosis not present

## 2020-02-06 DIAGNOSIS — I10 Essential (primary) hypertension: Secondary | ICD-10-CM | POA: Diagnosis not present

## 2020-02-06 DIAGNOSIS — I6521 Occlusion and stenosis of right carotid artery: Secondary | ICD-10-CM | POA: Diagnosis not present

## 2020-02-06 DIAGNOSIS — E782 Mixed hyperlipidemia: Secondary | ICD-10-CM | POA: Diagnosis not present

## 2020-02-06 DIAGNOSIS — I25810 Atherosclerosis of coronary artery bypass graft(s) without angina pectoris: Secondary | ICD-10-CM | POA: Diagnosis not present

## 2020-03-16 DIAGNOSIS — H40013 Open angle with borderline findings, low risk, bilateral: Secondary | ICD-10-CM | POA: Diagnosis not present

## 2020-03-16 DIAGNOSIS — H35039 Hypertensive retinopathy, unspecified eye: Secondary | ICD-10-CM | POA: Diagnosis not present

## 2020-03-16 DIAGNOSIS — H2513 Age-related nuclear cataract, bilateral: Secondary | ICD-10-CM | POA: Diagnosis not present

## 2020-04-20 ENCOUNTER — Other Ambulatory Visit
Admission: RE | Admit: 2020-04-20 | Discharge: 2020-04-20 | Disposition: A | Discharge status: Home / Self Care | Payer: Medicare HMO | Source: Ambulatory Visit | Attending: Pediatrics | Admitting: Pediatrics

## 2020-04-20 DIAGNOSIS — R05 Cough: Secondary | ICD-10-CM | POA: Diagnosis not present

## 2020-04-20 DIAGNOSIS — R9389 Abnormal findings on diagnostic imaging of other specified body structures: Secondary | ICD-10-CM | POA: Diagnosis not present

## 2020-04-20 DIAGNOSIS — Z03818 Encounter for observation for suspected exposure to other biological agents ruled out: Secondary | ICD-10-CM | POA: Diagnosis not present

## 2020-04-20 DIAGNOSIS — J40 Bronchitis, not specified as acute or chronic: Secondary | ICD-10-CM | POA: Diagnosis not present

## 2020-04-20 DIAGNOSIS — I517 Cardiomegaly: Secondary | ICD-10-CM | POA: Diagnosis not present

## 2020-04-20 DIAGNOSIS — R7989 Other specified abnormal findings of blood chemistry: Secondary | ICD-10-CM | POA: Diagnosis not present

## 2020-04-20 DIAGNOSIS — I10 Essential (primary) hypertension: Secondary | ICD-10-CM | POA: Diagnosis not present

## 2020-04-20 LAB — BRAIN NATRIURETIC PEPTIDE: B Natriuretic Peptide: 1006.7 pg/mL — ABNORMAL HIGH (ref 0.0–100.0)

## 2020-04-25 DIAGNOSIS — E782 Mixed hyperlipidemia: Secondary | ICD-10-CM | POA: Diagnosis not present

## 2020-04-25 DIAGNOSIS — I25810 Atherosclerosis of coronary artery bypass graft(s) without angina pectoris: Secondary | ICD-10-CM | POA: Diagnosis not present

## 2020-04-25 DIAGNOSIS — I6521 Occlusion and stenosis of right carotid artery: Secondary | ICD-10-CM | POA: Diagnosis not present

## 2020-04-25 DIAGNOSIS — I10 Essential (primary) hypertension: Secondary | ICD-10-CM | POA: Diagnosis not present

## 2020-06-22 DIAGNOSIS — I25810 Atherosclerosis of coronary artery bypass graft(s) without angina pectoris: Secondary | ICD-10-CM | POA: Diagnosis not present

## 2020-08-08 DIAGNOSIS — I6521 Occlusion and stenosis of right carotid artery: Secondary | ICD-10-CM | POA: Diagnosis not present

## 2020-08-08 DIAGNOSIS — I25810 Atherosclerosis of coronary artery bypass graft(s) without angina pectoris: Secondary | ICD-10-CM | POA: Diagnosis not present

## 2020-08-08 DIAGNOSIS — I10 Essential (primary) hypertension: Secondary | ICD-10-CM | POA: Diagnosis not present

## 2020-08-08 DIAGNOSIS — E782 Mixed hyperlipidemia: Secondary | ICD-10-CM | POA: Diagnosis not present

## 2020-09-25 ENCOUNTER — Encounter: Payer: Self-pay | Admitting: Emergency Medicine

## 2020-09-25 ENCOUNTER — Other Ambulatory Visit: Payer: Self-pay

## 2020-09-25 ENCOUNTER — Emergency Department
Admission: EM | Admit: 2020-09-25 | Discharge: 2020-09-25 | Disposition: A | Discharge status: Home / Self Care | Payer: Medicare HMO | Attending: Emergency Medicine | Admitting: Emergency Medicine

## 2020-09-25 DIAGNOSIS — J392 Other diseases of pharynx: Secondary | ICD-10-CM | POA: Insufficient documentation

## 2020-09-25 DIAGNOSIS — Z5321 Procedure and treatment not carried out due to patient leaving prior to being seen by health care provider: Secondary | ICD-10-CM | POA: Insufficient documentation

## 2020-09-25 DIAGNOSIS — R059 Cough, unspecified: Secondary | ICD-10-CM | POA: Insufficient documentation

## 2020-09-25 DIAGNOSIS — I10 Essential (primary) hypertension: Secondary | ICD-10-CM | POA: Diagnosis not present

## 2020-09-25 LAB — CBC
HCT: 41 % (ref 39.0–52.0)
Hemoglobin: 13.5 g/dL (ref 13.0–17.0)
MCH: 28 pg (ref 26.0–34.0)
MCHC: 32.9 g/dL (ref 30.0–36.0)
MCV: 85.1 fL (ref 80.0–100.0)
Platelets: 146 10*3/uL — ABNORMAL LOW (ref 150–400)
RBC: 4.82 MIL/uL (ref 4.22–5.81)
RDW: 13.7 % (ref 11.5–15.5)
WBC: 7.6 10*3/uL (ref 4.0–10.5)
nRBC: 0 % (ref 0.0–0.2)

## 2020-09-25 LAB — BASIC METABOLIC PANEL
Anion gap: 9 (ref 5–15)
BUN: 30 mg/dL — ABNORMAL HIGH (ref 8–23)
CO2: 24 mmol/L (ref 22–32)
Calcium: 8.9 mg/dL (ref 8.9–10.3)
Chloride: 104 mmol/L (ref 98–111)
Creatinine, Ser: 2.52 mg/dL — ABNORMAL HIGH (ref 0.61–1.24)
GFR, Estimated: 24 mL/min — ABNORMAL LOW (ref >60)
Glucose, Bld: 94 mg/dL (ref 70–99)
Potassium: 4.2 mmol/L (ref 3.5–5.1)
Sodium: 137 mmol/L (ref 135–145)

## 2020-09-25 LAB — TROPONIN I (HIGH SENSITIVITY)
Troponin I (High Sensitivity): 34 ng/L — ABNORMAL HIGH (ref <18)
Troponin I (High Sensitivity): 37 ng/L — ABNORMAL HIGH (ref <18)

## 2020-09-25 NOTE — ED Triage Notes (Signed)
Patient to ER for c/o HTN and cough. Patient is unsure of how high BP is. Denies chest pain. +Itching in throat. Denies fevers.

## 2020-09-25 NOTE — ED Notes (Signed)
Pt left Luanne offered pt food and drink. Pt stated he was hungry and was leaving.

## 2020-10-01 DIAGNOSIS — I517 Cardiomegaly: Secondary | ICD-10-CM | POA: Diagnosis not present

## 2020-10-01 DIAGNOSIS — I10 Essential (primary) hypertension: Secondary | ICD-10-CM | POA: Diagnosis not present

## 2020-10-01 DIAGNOSIS — J811 Chronic pulmonary edema: Secondary | ICD-10-CM | POA: Diagnosis not present

## 2020-10-01 DIAGNOSIS — R059 Cough, unspecified: Secondary | ICD-10-CM | POA: Diagnosis not present

## 2020-10-19 DIAGNOSIS — I10 Essential (primary) hypertension: Secondary | ICD-10-CM | POA: Diagnosis not present

## 2020-10-19 DIAGNOSIS — R059 Cough, unspecified: Secondary | ICD-10-CM | POA: Diagnosis not present

## 2020-10-24 DIAGNOSIS — R059 Cough, unspecified: Secondary | ICD-10-CM | POA: Diagnosis not present

## 2020-10-24 DIAGNOSIS — R29898 Other symptoms and signs involving the musculoskeletal system: Secondary | ICD-10-CM | POA: Diagnosis not present

## 2020-10-24 DIAGNOSIS — I10 Essential (primary) hypertension: Secondary | ICD-10-CM | POA: Diagnosis not present

## 2020-10-30 DIAGNOSIS — R748 Abnormal levels of other serum enzymes: Secondary | ICD-10-CM | POA: Diagnosis not present

## 2020-10-30 DIAGNOSIS — R29898 Other symptoms and signs involving the musculoskeletal system: Secondary | ICD-10-CM | POA: Diagnosis not present

## 2020-11-06 DIAGNOSIS — I10 Essential (primary) hypertension: Secondary | ICD-10-CM | POA: Diagnosis not present

## 2020-11-06 DIAGNOSIS — R748 Abnormal levels of other serum enzymes: Secondary | ICD-10-CM | POA: Diagnosis not present

## 2020-11-06 DIAGNOSIS — K759 Inflammatory liver disease, unspecified: Secondary | ICD-10-CM | POA: Diagnosis not present

## 2020-11-19 DIAGNOSIS — I10 Essential (primary) hypertension: Secondary | ICD-10-CM | POA: Diagnosis not present

## 2020-11-19 DIAGNOSIS — R630 Anorexia: Secondary | ICD-10-CM | POA: Diagnosis not present

## 2020-12-11 ENCOUNTER — Other Ambulatory Visit: Payer: Self-pay

## 2020-12-11 ENCOUNTER — Emergency Department: Payer: Medicare HMO

## 2020-12-11 ENCOUNTER — Emergency Department
Admission: EM | Admit: 2020-12-11 | Discharge: 2020-12-11 | Disposition: A | Discharge status: Home / Self Care | Payer: Medicare HMO | Attending: Emergency Medicine | Admitting: Emergency Medicine

## 2020-12-11 ENCOUNTER — Encounter: Payer: Self-pay | Admitting: Emergency Medicine

## 2020-12-11 DIAGNOSIS — R1012 Left upper quadrant pain: Secondary | ICD-10-CM | POA: Insufficient documentation

## 2020-12-11 DIAGNOSIS — K920 Hematemesis: Secondary | ICD-10-CM | POA: Diagnosis not present

## 2020-12-11 DIAGNOSIS — Z8673 Personal history of transient ischemic attack (TIA), and cerebral infarction without residual deficits: Secondary | ICD-10-CM | POA: Insufficient documentation

## 2020-12-11 DIAGNOSIS — K2961 Other gastritis with bleeding: Secondary | ICD-10-CM | POA: Diagnosis not present

## 2020-12-11 DIAGNOSIS — K2900 Acute gastritis without bleeding: Secondary | ICD-10-CM

## 2020-12-11 DIAGNOSIS — I10 Essential (primary) hypertension: Secondary | ICD-10-CM | POA: Diagnosis not present

## 2020-12-11 DIAGNOSIS — N289 Disorder of kidney and ureter, unspecified: Secondary | ICD-10-CM | POA: Diagnosis not present

## 2020-12-11 DIAGNOSIS — K2960 Other gastritis without bleeding: Secondary | ICD-10-CM | POA: Diagnosis not present

## 2020-12-11 DIAGNOSIS — N189 Chronic kidney disease, unspecified: Secondary | ICD-10-CM

## 2020-12-11 DIAGNOSIS — R531 Weakness: Secondary | ICD-10-CM | POA: Diagnosis not present

## 2020-12-11 DIAGNOSIS — N179 Acute kidney failure, unspecified: Secondary | ICD-10-CM | POA: Diagnosis not present

## 2020-12-11 LAB — URINALYSIS, COMPLETE (UACMP) WITH MICROSCOPIC
Bacteria, UA: NONE SEEN
Bilirubin Urine: NEGATIVE
Glucose, UA: NEGATIVE mg/dL
Hgb urine dipstick: NEGATIVE
Ketones, ur: 5 mg/dL — AB
Leukocytes,Ua: NEGATIVE
Nitrite: NEGATIVE
Protein, ur: NEGATIVE mg/dL
Specific Gravity, Urine: 1.005 (ref 1.005–1.030)
Squamous Epithelial / LPF: NONE SEEN (ref 0–5)
pH: 5 (ref 5.0–8.0)

## 2020-12-11 LAB — CBC
HCT: 34.2 % — ABNORMAL LOW (ref 39.0–52.0)
Hemoglobin: 11.3 g/dL — ABNORMAL LOW (ref 13.0–17.0)
MCH: 28.2 pg (ref 26.0–34.0)
MCHC: 33 g/dL (ref 30.0–36.0)
MCV: 85.3 fL (ref 80.0–100.0)
Platelets: 182 10*3/uL (ref 150–400)
RBC: 4.01 MIL/uL — ABNORMAL LOW (ref 4.22–5.81)
RDW: 15.3 % (ref 11.5–15.5)
WBC: 6.8 10*3/uL (ref 4.0–10.5)
nRBC: 0 % (ref 0.0–0.2)

## 2020-12-11 LAB — COMPREHENSIVE METABOLIC PANEL
ALT: 27 U/L (ref 0–44)
AST: 32 U/L (ref 15–41)
Albumin: 3.4 g/dL — ABNORMAL LOW (ref 3.5–5.0)
Alkaline Phosphatase: 121 U/L (ref 38–126)
Anion gap: 9 (ref 5–15)
BUN: 49 mg/dL — ABNORMAL HIGH (ref 8–23)
CO2: 22 mmol/L (ref 22–32)
Calcium: 8.9 mg/dL (ref 8.9–10.3)
Chloride: 102 mmol/L (ref 98–111)
Creatinine, Ser: 2.55 mg/dL — ABNORMAL HIGH (ref 0.61–1.24)
GFR, Estimated: 24 mL/min — ABNORMAL LOW (ref >60)
Glucose, Bld: 102 mg/dL — ABNORMAL HIGH (ref 70–99)
Potassium: 4.4 mmol/L (ref 3.5–5.1)
Sodium: 133 mmol/L — ABNORMAL LOW (ref 135–145)
Total Bilirubin: 0.7 mg/dL (ref 0.3–1.2)
Total Protein: 7.3 g/dL (ref 6.5–8.1)

## 2020-12-11 LAB — PROTIME-INR
INR: 1.1 (ref 0.8–1.2)
Prothrombin Time: 13.3 seconds (ref 11.4–15.2)

## 2020-12-11 LAB — LIPASE, BLOOD: Lipase: 32 U/L (ref 11–51)

## 2020-12-11 LAB — CK: Total CK: 140 U/L (ref 49–397)

## 2020-12-11 MED ORDER — IOHEXOL 9 MG/ML PO SOLN
500.0000 mL | ORAL | Status: AC
  Administered 2020-12-11 (×2): 500 mL via ORAL

## 2020-12-11 MED ORDER — FAMOTIDINE 40 MG PO TABS: 40.0000 mg | ORAL_TABLET | Freq: Every evening | ORAL | 1 refills | Status: DC

## 2020-12-11 MED ORDER — FUROSEMIDE 10 MG/ML IJ SOLN
20.0000 mg | Freq: Once | INTRAMUSCULAR | Status: AC
  Administered 2020-12-11: 20 mg via INTRAVENOUS
  Filled 2020-12-11: qty 4

## 2020-12-11 MED ORDER — SODIUM CHLORIDE 0.9 % IV BOLUS
1000.0000 mL | Freq: Once | INTRAVENOUS | Status: AC
  Administered 2020-12-11: 1000 mL via INTRAVENOUS

## 2020-12-11 MED ORDER — ALUM & MAG HYDROXIDE-SIMETH 200-200-20 MG/5ML PO SUSP
15.0000 mL | Freq: Once | ORAL | Status: AC
  Administered 2020-12-11: 15 mL via ORAL
  Filled 2020-12-11: qty 30

## 2020-12-11 MED ORDER — FAMOTIDINE 20 MG PO TABS
20.0000 mg | ORAL_TABLET | Freq: Once | ORAL | Status: AC
  Administered 2020-12-11: 20 mg via ORAL
  Filled 2020-12-11: qty 1

## 2020-12-11 MED ORDER — SUCRALFATE 1 GM/10ML PO SUSP: 1.0000 g | Freq: Four times a day (QID) | ORAL | 1 refills | Status: DC

## 2020-12-11 NOTE — ED Notes (Signed)
Pt to ED with family member stating that last night he vomited "pure blood" 4 times and then after that was "spitting blood" but not vomiting. Denies blood in stool. Not vomiting at this time. Pt states that before hematemesis started last night, he accidentally drank 2 sips of rubbing alcohol by accident because he mistook it for water and after this was when hematemesis started. Pt has medication bottles with him. Significant medications noted: clopidrogel '75mg'$  1 tab/day, furosemide, ramipril, metoprolol.  Pt in NAD at this time.

## 2020-12-11 NOTE — ED Notes (Signed)
Pt ate [place] sandwich tray and drank water. Denies difficulty swallowing or pain.

## 2020-12-11 NOTE — ED Triage Notes (Signed)
Triage through video interpretor.  Patient states "I feel a little bit shaky but I usually take pills for sleep."  States last night took pills for sleep and took a sip of water, that was alcohol, not water, so patient tried to spit out pills.  This morning he woke up feeling shaky and patient tried to vomit, but couldn't and then noticed to be 'spitting up blood'.  Denies c/o pain.  AAOx3.  Skin warm and dry. NAD  Patient is HOH.

## 2020-12-11 NOTE — ED Provider Notes (Signed)
[facility] Emergency Department Provider Note   ____________________________________________   Event Date/Time   First MD Initiated Contact with Patient 12/11/20 1203     (approximate)  I have reviewed the triage vital signs and the nursing notes.   HISTORY  Chief Complaint Hematemesis  Spanish interpreter video utilized  HPI Daniel Bolton is a 87 y.o. male here for evaluation as he spit up small amounts of blood this morning   Reports he took a sleep medicine last night and normally drinks water with it, but he had a bottle of 70% isopropyl alcohol at the bedside when she accidentally took about 1 or 2 sips out of and swallowed.  He went to bed and slept, this morning woke up and felt like he had a little bit of discomfort in his stomach slight nausea and he spit up a small amount of saliva and a couple small dots of blood.  This is gone away he feels better now symptoms improving.  He has felt a little bit fatigued as of late, and has been seeing his doctor for concerns with his kidneys.  Sees Dr. Rosalene Billings  No recent illnesses.  No fevers or chills.  No chest pain or trouble breathing.  Symptoms of discomfort in his stomach seem to largely have improved.  No vomiting.  No diarrhea.  No black or bloody stools.  Past Medical History:  Diagnosis Date  . Hypertension   . Stroke [facility])     Patient Active Problem List   Diagnosis Date Noted  . Moderate major depression, single episode ([place]) 08/01/2015  . Coronary artery disease 08/01/2015  . Financial problems 08/01/2015    Past Surgical History:  Procedure Laterality Date  . BRAIN SURGERY    . CARDIAC SURGERY      Prior to Admission medications   Medication Sig Start Date End Date Taking? Authorizing Provider  amoxicillin (AMOXIL) 500 MG capsule Take 1 capsule (500 mg total) by mouth 3 (three) times daily. 12/29/16   Paulette Blanch, MD  azithromycin (ZITHROMAX) 250 MG tablet Take 1  tablet (250 mg total) by mouth daily. 12/29/16   Paulette Blanch, MD  chlorpheniramine-HYDROcodone (TUSSIONEX PENNKINETIC ER) 10-8 MG/5ML SUER Take 5 mLs by mouth 2 (two) times daily. 12/29/16   Paulette Blanch, MD  mirtazapine (REMERON) 15 MG tablet Take 1 tablet (15 mg total) by mouth at bedtime. 08/01/15   Clapacs, Madie Reno, MD  oseltamivir (TAMIFLU) 75 MG capsule Take 1 capsule (75 mg total) by mouth 2 (two) times daily. 12/29/16   Paulette Blanch, MD    Allergies Patient has no known allergies.  No family history on file.  Social History Social History   Tobacco Use  . Smoking status: Never Smoker  . Smokeless tobacco: Never Used  Substance Use Topics  . Alcohol use: No  . Drug use: No    Review of Systems Constitutional: No fever/chills Eyes: No visual changes. ENT: No sore throat.  Had a small amount of blood in his saliva when he spit up a little bit this morning.  That is improved. Cardiovascular: Denies chest pain. Respiratory: Denies shortness of breath. Gastrointestinal: No abdominal pain.  Had some abdominal pain in his left upper abdomen.  Seems to be improved.  Patient reports he is feeling pretty well right now would actually like to eat something feels like he could go have a hamburger. Genitourinary: Negative for dysuria. Musculoskeletal: Negative for back pain. Skin: Negative for  rash. Neurological: Negative for headaches, areas of focal weakness or numbness.    ____________________________________________   PHYSICAL EXAM:  VITAL SIGNS: ED Triage Vitals  Enc Vitals Group     BP 12/11/20 1000 (!) 180/71     Pulse Rate 12/11/20 1000 61     Resp 12/11/20 1000 14     Temp 12/11/20 1000 (!) 97.5 F (36.4 C)     Temp Source 12/11/20 1000 Oral     SpO2 12/11/20 1000 99 %     Weight 12/11/20 0956 139 lb 15.9 oz (63.5 kg)     Height 12/11/20 0956 6' 0.5" (1.842 m)     Head Circumference --      Peak Flow --      Pain Score 12/11/20 0956 0     Pain Loc --      Pain  Edu? --      Excl. in [place]? --     Constitutional: Alert and oriented. Well appearing and in no acute distress. Eyes: Conjunctivae are normal. Head: Atraumatic. Nose: No congestion/rhinnorhea. Mouth/Throat: Mucous membranes are moist.  No erythema or lesions noted in the oral cavity or posterior pharynx.  No trouble swallowing.  No edema. Neck: No stridor.  Cardiovascular: Normal rate, regular rhythm. Grossly normal heart sounds.  Good peripheral circulation. Respiratory: Normal respiratory effort.  No retractions. Lungs CTAB. Gastrointestinal: Soft and he does report a mild tenderness in the left upper quadrant.  There is no rebound or guarding.  No distention.. No distention. Musculoskeletal: No lower extremity tenderness nor edema. Neurologic:  Normal speech and language. No gross focal neurologic deficits are appreciated.  Skin:  Skin is warm, dry and intact. No rash noted. Psychiatric: Mood and affect are normal. Speech and behavior are normal.  ____________________________________________   LABS (all labs ordered are listed, but only abnormal results are displayed)  Labs Reviewed  COMPREHENSIVE METABOLIC PANEL - Abnormal; Notable for the following components:      Result Value   Sodium 133 (*)    Glucose, Bld 102 (*)    BUN 49 (*)    Creatinine, Ser 2.55 (*)    Albumin 3.4 (*)    GFR, Estimated 24 (*)    All other components within normal limits  CBC - Abnormal; Notable for the following components:   RBC 4.01 (*)    Hemoglobin 11.3 (*)    HCT 34.2 (*)    All other components within normal limits  URINALYSIS, COMPLETE (UACMP) WITH MICROSCOPIC - Abnormal; Notable for the following components:   Color, Urine STRAW (*)    APPearance CLEAR (*)    Ketones, ur 5 (*)    All other components within normal limits  LIPASE, BLOOD  PROTIME-INR  CK   ____________________________________________  EKG   ____________________________________________  RADIOLOGY  CT imaging  ordered due to the what appears to be some mild AKI.  Have ordered this to further evaluate for obstructive pathology, not so much because of concern regarding his left upper abdominal tenderness which I suspect is mild gastritis.   ____________________________________________   PROCEDURES  Procedure(s) performed: None  Procedures  Critical Care performed: No  ____________________________________________   INITIAL IMPRESSION / ASSESSMENT AND PLAN / ED COURSE  Pertinent labs & imaging results that were available during my care of the patient were reviewed by me and considered in my medical decision making (see chart for details).   Patient is for evaluation of spitting up a small amount of blood this morning.  He does not describe vomiting any large amounts of blood or hematemesis.  I suspect he may have had some mild gastritis associated with drinking a couple sips of isopropyl alcohol.  He is fully awake and alert without distress feels improved wants to eat a hamburger now.  Does have some mild AKI, and I have reviewed his records and he has on previous checks had elevated creatinines, though this downtrend it somewhat through his primary care notes.  He evidently recently had some rhabdo myelolysis as well.  We will check a CT scan to evaluate for obstructive acute renal pathology, but suspect patient likely will be able to be discharged home with careful return precautions after accidental ingestion of small amount of isopropyl alcohol.  Clinical Course as of 12/11/20 1503  Tue Dec 11, 2020  1205 Interpreter paged.  Patient ambulatory without distress at this time [MQ]  1251 Video interprester utilized throughout [MQ]    Clinical Course User Index [MQ] Delman Kitten, MD   No cardiac or pulmonary symptoms.  Normal mental status.  He is in no distress.  Symptoms largely improved   ----------------------------------------- 3:02 PM on  12/11/2020 -----------------------------------------  Thus far patient's work-up very reassuring.  He does have a mild elevation of his creatinine 2.5 approximating his previous check in the [facility] health system, but elevated compared to his last PCP check.  Will hydrate, awaiting CT to evaluate for acute intra-abdominal pathology or obstructive pathology/renal disease.  Ongoing care assigned to Dr. Leory Plowman her, follow-up on pending CT imaging and patient reassessment.  Suspect likely be able to be discharged thereafter if reassuring work-up to follow-up with primary care. ____________________________________________   FINAL CLINICAL IMPRESSION(S) / ED DIAGNOSES  Final diagnoses:  Other acute gastritis, presence of bleeding unspecified  Acute on chronic renal insufficiency        Note:  This document was prepared using Dragon voice recognition software and may include unintentional dictation errors       Delman Kitten, MD 12/11/20 1504

## 2020-12-11 NOTE — ED Notes (Signed)
EDP at bedside examining and questioning pt. Friend at bedside with pt.

## 2020-12-17 DIAGNOSIS — R059 Cough, unspecified: Secondary | ICD-10-CM | POA: Diagnosis not present

## 2020-12-17 DIAGNOSIS — K2971 Gastritis, unspecified, with bleeding: Secondary | ICD-10-CM | POA: Diagnosis not present

## 2021-01-02 DIAGNOSIS — I5022 Chronic systolic (congestive) heart failure: Secondary | ICD-10-CM | POA: Diagnosis not present

## 2021-01-09 ENCOUNTER — Other Ambulatory Visit: Payer: Self-pay | Admitting: Nephrology

## 2021-01-09 DIAGNOSIS — I509 Heart failure, unspecified: Secondary | ICD-10-CM | POA: Diagnosis not present

## 2021-01-09 DIAGNOSIS — R609 Edema, unspecified: Secondary | ICD-10-CM | POA: Diagnosis not present

## 2021-01-09 DIAGNOSIS — N184 Chronic kidney disease, stage 4 (severe): Secondary | ICD-10-CM | POA: Diagnosis not present

## 2021-01-09 DIAGNOSIS — D631 Anemia in chronic kidney disease: Secondary | ICD-10-CM | POA: Diagnosis not present

## 2021-01-09 DIAGNOSIS — R809 Proteinuria, unspecified: Secondary | ICD-10-CM

## 2021-01-09 DIAGNOSIS — E785 Hyperlipidemia, unspecified: Secondary | ICD-10-CM | POA: Diagnosis not present

## 2021-01-09 DIAGNOSIS — I639 Cerebral infarction, unspecified: Secondary | ICD-10-CM | POA: Diagnosis not present

## 2021-01-09 DIAGNOSIS — R8290 Unspecified abnormal findings in urine: Secondary | ICD-10-CM

## 2021-01-09 DIAGNOSIS — I10 Essential (primary) hypertension: Secondary | ICD-10-CM | POA: Diagnosis not present

## 2021-01-09 DIAGNOSIS — D638 Anemia in other chronic diseases classified elsewhere: Secondary | ICD-10-CM

## 2021-01-14 DIAGNOSIS — I5022 Chronic systolic (congestive) heart failure: Secondary | ICD-10-CM | POA: Diagnosis not present

## 2021-01-17 DIAGNOSIS — E782 Mixed hyperlipidemia: Secondary | ICD-10-CM | POA: Diagnosis not present

## 2021-01-17 DIAGNOSIS — N400 Enlarged prostate without lower urinary tract symptoms: Secondary | ICD-10-CM | POA: Diagnosis not present

## 2021-01-17 DIAGNOSIS — I25810 Atherosclerosis of coronary artery bypass graft(s) without angina pectoris: Secondary | ICD-10-CM | POA: Diagnosis not present

## 2021-01-17 DIAGNOSIS — I10 Essential (primary) hypertension: Secondary | ICD-10-CM | POA: Diagnosis not present

## 2021-03-10 ENCOUNTER — Emergency Department: Payer: Medicare HMO

## 2021-03-10 ENCOUNTER — Inpatient Hospital Stay
Admission: EM | Admit: 2021-03-10 | Discharge: 2021-03-14 | DRG: 291 | Disposition: A | Discharge status: Home / Self Care | Payer: Medicare HMO | Attending: Family Medicine | Admitting: Family Medicine

## 2021-03-10 DIAGNOSIS — N1832 Chronic kidney disease, stage 3b: Secondary | ICD-10-CM | POA: Diagnosis present

## 2021-03-10 DIAGNOSIS — I5041 Acute combined systolic (congestive) and diastolic (congestive) heart failure: Secondary | ICD-10-CM

## 2021-03-10 DIAGNOSIS — I130 Hypertensive heart and chronic kidney disease with heart failure and stage 1 through stage 4 chronic kidney disease, or unspecified chronic kidney disease: Principal | ICD-10-CM | POA: Diagnosis present

## 2021-03-10 DIAGNOSIS — Z951 Presence of aortocoronary bypass graft: Secondary | ICD-10-CM

## 2021-03-10 DIAGNOSIS — I083 Combined rheumatic disorders of mitral, aortic and tricuspid valves: Secondary | ICD-10-CM | POA: Diagnosis present

## 2021-03-10 DIAGNOSIS — I161 Hypertensive emergency: Secondary | ICD-10-CM | POA: Diagnosis present

## 2021-03-10 DIAGNOSIS — Z79899 Other long term (current) drug therapy: Secondary | ICD-10-CM

## 2021-03-10 DIAGNOSIS — J9601 Acute respiratory failure with hypoxia: Secondary | ICD-10-CM | POA: Diagnosis present

## 2021-03-10 DIAGNOSIS — Z20822 Contact with and (suspected) exposure to covid-19: Secondary | ICD-10-CM | POA: Diagnosis present

## 2021-03-10 DIAGNOSIS — I2510 Atherosclerotic heart disease of native coronary artery without angina pectoris: Secondary | ICD-10-CM | POA: Diagnosis present

## 2021-03-10 DIAGNOSIS — Z7902 Long term (current) use of antithrombotics/antiplatelets: Secondary | ICD-10-CM

## 2021-03-10 DIAGNOSIS — I5023 Acute on chronic systolic (congestive) heart failure: Secondary | ICD-10-CM | POA: Diagnosis present

## 2021-03-10 DIAGNOSIS — Z23 Encounter for immunization: Secondary | ICD-10-CM

## 2021-03-10 DIAGNOSIS — J449 Chronic obstructive pulmonary disease, unspecified: Secondary | ICD-10-CM | POA: Diagnosis present

## 2021-03-10 DIAGNOSIS — N179 Acute kidney failure, unspecified: Secondary | ICD-10-CM | POA: Diagnosis present

## 2021-03-10 DIAGNOSIS — I160 Hypertensive urgency: Secondary | ICD-10-CM | POA: Diagnosis present

## 2021-03-10 DIAGNOSIS — D631 Anemia in chronic kidney disease: Secondary | ICD-10-CM | POA: Diagnosis present

## 2021-03-10 DIAGNOSIS — D649 Anemia, unspecified: Secondary | ICD-10-CM | POA: Diagnosis present

## 2021-03-10 DIAGNOSIS — N184 Chronic kidney disease, stage 4 (severe): Secondary | ICD-10-CM | POA: Diagnosis present

## 2021-03-10 DIAGNOSIS — Z8673 Personal history of transient ischemic attack (TIA), and cerebral infarction without residual deficits: Secondary | ICD-10-CM

## 2021-03-10 DIAGNOSIS — E876 Hypokalemia: Secondary | ICD-10-CM | POA: Diagnosis not present

## 2021-03-10 DIAGNOSIS — I509 Heart failure, unspecified: Secondary | ICD-10-CM | POA: Diagnosis not present

## 2021-03-10 DIAGNOSIS — I5043 Acute on chronic combined systolic (congestive) and diastolic (congestive) heart failure: Secondary | ICD-10-CM | POA: Diagnosis present

## 2021-03-10 MED ORDER — FUROSEMIDE 10 MG/ML IJ SOLN
40.0000 mg | Freq: Once | INTRAMUSCULAR | Status: AC
  Administered 2021-03-11: 40 mg via INTRAVENOUS
  Filled 2021-03-10: qty 4

## 2021-03-10 NOTE — ED Triage Notes (Signed)
Cough and sob x 1 day, general weakness, cardiac history

## 2021-03-11 ENCOUNTER — Encounter: Payer: Self-pay | Admitting: Internal Medicine

## 2021-03-11 DIAGNOSIS — I161 Hypertensive emergency: Secondary | ICD-10-CM | POA: Diagnosis present

## 2021-03-11 DIAGNOSIS — J449 Chronic obstructive pulmonary disease, unspecified: Secondary | ICD-10-CM | POA: Diagnosis present

## 2021-03-11 DIAGNOSIS — N184 Chronic kidney disease, stage 4 (severe): Secondary | ICD-10-CM | POA: Diagnosis present

## 2021-03-11 DIAGNOSIS — Z8673 Personal history of transient ischemic attack (TIA), and cerebral infarction without residual deficits: Secondary | ICD-10-CM | POA: Diagnosis not present

## 2021-03-11 DIAGNOSIS — N1832 Chronic kidney disease, stage 3b: Secondary | ICD-10-CM | POA: Diagnosis present

## 2021-03-11 DIAGNOSIS — E876 Hypokalemia: Secondary | ICD-10-CM | POA: Diagnosis not present

## 2021-03-11 DIAGNOSIS — I5023 Acute on chronic systolic (congestive) heart failure: Secondary | ICD-10-CM

## 2021-03-11 DIAGNOSIS — Z951 Presence of aortocoronary bypass graft: Secondary | ICD-10-CM | POA: Diagnosis not present

## 2021-03-11 DIAGNOSIS — Z7902 Long term (current) use of antithrombotics/antiplatelets: Secondary | ICD-10-CM | POA: Diagnosis not present

## 2021-03-11 DIAGNOSIS — D631 Anemia in chronic kidney disease: Secondary | ICD-10-CM | POA: Diagnosis present

## 2021-03-11 DIAGNOSIS — I130 Hypertensive heart and chronic kidney disease with heart failure and stage 1 through stage 4 chronic kidney disease, or unspecified chronic kidney disease: Secondary | ICD-10-CM | POA: Diagnosis present

## 2021-03-11 DIAGNOSIS — J9601 Acute respiratory failure with hypoxia: Secondary | ICD-10-CM | POA: Diagnosis present

## 2021-03-11 DIAGNOSIS — I160 Hypertensive urgency: Secondary | ICD-10-CM | POA: Diagnosis present

## 2021-03-11 DIAGNOSIS — I509 Heart failure, unspecified: Secondary | ICD-10-CM | POA: Diagnosis present

## 2021-03-11 DIAGNOSIS — I5043 Acute on chronic combined systolic (congestive) and diastolic (congestive) heart failure: Secondary | ICD-10-CM | POA: Diagnosis present

## 2021-03-11 DIAGNOSIS — Z79899 Other long term (current) drug therapy: Secondary | ICD-10-CM | POA: Diagnosis not present

## 2021-03-11 DIAGNOSIS — I2510 Atherosclerotic heart disease of native coronary artery without angina pectoris: Secondary | ICD-10-CM | POA: Diagnosis present

## 2021-03-11 DIAGNOSIS — Z23 Encounter for immunization: Secondary | ICD-10-CM | POA: Diagnosis present

## 2021-03-11 DIAGNOSIS — D649 Anemia, unspecified: Secondary | ICD-10-CM | POA: Diagnosis not present

## 2021-03-11 DIAGNOSIS — Z20822 Contact with and (suspected) exposure to covid-19: Secondary | ICD-10-CM | POA: Diagnosis present

## 2021-03-11 DIAGNOSIS — I083 Combined rheumatic disorders of mitral, aortic and tricuspid valves: Secondary | ICD-10-CM | POA: Diagnosis present

## 2021-03-11 DIAGNOSIS — N179 Acute kidney failure, unspecified: Secondary | ICD-10-CM | POA: Diagnosis present

## 2021-03-11 LAB — BASIC METABOLIC PANEL
Anion gap: 7 (ref 5–15)
Anion gap: 7 (ref 5–15)
BUN: 32 mg/dL — ABNORMAL HIGH (ref 8–23)
BUN: 35 mg/dL — ABNORMAL HIGH (ref 8–23)
CO2: 25 mmol/L (ref 22–32)
CO2: 26 mmol/L (ref 22–32)
Calcium: 8.1 mg/dL — ABNORMAL LOW (ref 8.9–10.3)
Calcium: 8.5 mg/dL — ABNORMAL LOW (ref 8.9–10.3)
Chloride: 102 mmol/L (ref 98–111)
Chloride: 104 mmol/L (ref 98–111)
Creatinine, Ser: 1.46 mg/dL — ABNORMAL HIGH (ref 0.61–1.24)
Creatinine, Ser: 1.55 mg/dL — ABNORMAL HIGH (ref 0.61–1.24)
GFR, Estimated: 44 mL/min — ABNORMAL LOW (ref >60)
GFR, Estimated: 47 mL/min — ABNORMAL LOW (ref >60)
Glucose, Bld: 107 mg/dL — ABNORMAL HIGH (ref 70–99)
Glucose, Bld: 121 mg/dL — ABNORMAL HIGH (ref 70–99)
Potassium: 3.5 mmol/L (ref 3.5–5.1)
Potassium: 3.9 mmol/L (ref 3.5–5.1)
Sodium: 135 mmol/L (ref 135–145)
Sodium: 136 mmol/L (ref 135–145)

## 2021-03-11 LAB — CBC WITH DIFFERENTIAL/PLATELET
Abs Immature Granulocytes: 0.04 10*3/uL (ref 0.00–0.07)
Basophils Absolute: 0.1 10*3/uL (ref 0.0–0.1)
Basophils Relative: 1 %
Eosinophils Absolute: 0.2 10*3/uL (ref 0.0–0.5)
Eosinophils Relative: 2 %
HCT: 35.1 % — ABNORMAL LOW (ref 39.0–52.0)
Hemoglobin: 11.3 g/dL — ABNORMAL LOW (ref 13.0–17.0)
Immature Granulocytes: 0 %
Lymphocytes Relative: 16 %
Lymphs Abs: 1.8 10*3/uL (ref 0.7–4.0)
MCH: 28.2 pg (ref 26.0–34.0)
MCHC: 32.2 g/dL (ref 30.0–36.0)
MCV: 87.5 fL (ref 80.0–100.0)
Monocytes Absolute: 0.9 10*3/uL (ref 0.1–1.0)
Monocytes Relative: 8 %
Neutro Abs: 7.9 10*3/uL — ABNORMAL HIGH (ref 1.7–7.7)
Neutrophils Relative %: 73 %
Platelets: 180 10*3/uL (ref 150–400)
RBC: 4.01 MIL/uL — ABNORMAL LOW (ref 4.22–5.81)
RDW: 13.7 % (ref 11.5–15.5)
WBC: 10.8 10*3/uL — ABNORMAL HIGH (ref 4.0–10.5)
nRBC: 0 % (ref 0.0–0.2)

## 2021-03-11 LAB — CBC
HCT: 32.6 % — ABNORMAL LOW (ref 39.0–52.0)
Hemoglobin: 10.8 g/dL — ABNORMAL LOW (ref 13.0–17.0)
MCH: 28.9 pg (ref 26.0–34.0)
MCHC: 33.1 g/dL (ref 30.0–36.0)
MCV: 87.2 fL (ref 80.0–100.0)
Platelets: 187 10*3/uL (ref 150–400)
RBC: 3.74 MIL/uL — ABNORMAL LOW (ref 4.22–5.81)
RDW: 13.9 % (ref 11.5–15.5)
WBC: 11.8 10*3/uL — ABNORMAL HIGH (ref 4.0–10.5)
nRBC: 0 % (ref 0.0–0.2)

## 2021-03-11 LAB — PROCALCITONIN: Procalcitonin: <0.1 ng/mL

## 2021-03-11 LAB — RESP PANEL BY RT-PCR (FLU A&B, COVID) ARPGX2
Influenza A by PCR: NEGATIVE
Influenza B by PCR: NEGATIVE
SARS Coronavirus 2 by RT PCR: NEGATIVE

## 2021-03-11 LAB — MAGNESIUM: Magnesium: 1.8 mg/dL (ref 1.7–2.4)

## 2021-03-11 LAB — BRAIN NATRIURETIC PEPTIDE: B Natriuretic Peptide: 2626.3 pg/mL — ABNORMAL HIGH (ref 0.0–100.0)

## 2021-03-11 LAB — TSH: TSH: 1.41 u[IU]/mL (ref 0.350–4.500)

## 2021-03-11 LAB — TROPONIN I (HIGH SENSITIVITY)
Troponin I (High Sensitivity): 46 ng/L — ABNORMAL HIGH (ref <18)
Troponin I (High Sensitivity): 51 ng/L — ABNORMAL HIGH (ref <18)
Troponin I (High Sensitivity): 57 ng/L — ABNORMAL HIGH (ref <18)

## 2021-03-11 MED ORDER — HEPARIN SODIUM (PORCINE) 5000 UNIT/ML IJ SOLN
5000.0000 [IU] | Freq: Three times a day (TID) | INTRAMUSCULAR | Status: DC
  Administered 2021-03-11 – 2021-03-14 (×10): 5000 [IU] via SUBCUTANEOUS
  Filled 2021-03-11 (×10): qty 1

## 2021-03-11 MED ORDER — ACETAMINOPHEN 650 MG RE SUPP: 650.0000 mg | Freq: Four times a day (QID) | RECTAL | Status: DC | PRN

## 2021-03-11 MED ORDER — ATORVASTATIN CALCIUM 20 MG PO TABS
40.0000 mg | ORAL_TABLET | Freq: Every day | ORAL | Status: DC
  Administered 2021-03-11 – 2021-03-14 (×4): 40 mg via ORAL
  Filled 2021-03-11 (×3): qty 2

## 2021-03-11 MED ORDER — FUROSEMIDE 10 MG/ML IJ SOLN
60.0000 mg | Freq: Two times a day (BID) | INTRAMUSCULAR | Status: DC
  Administered 2021-03-11 – 2021-03-12 (×3): 60 mg via INTRAVENOUS
  Filled 2021-03-11 (×2): qty 8
  Filled 2021-03-11: qty 6

## 2021-03-11 MED ORDER — CLOPIDOGREL BISULFATE 75 MG PO TABS
75.0000 mg | ORAL_TABLET | Freq: Every day | ORAL | Status: DC
  Administered 2021-03-11 – 2021-03-14 (×4): 75 mg via ORAL
  Filled 2021-03-11 (×4): qty 1

## 2021-03-11 MED ORDER — NITROGLYCERIN IN D5W 200-5 MCG/ML-% IV SOLN
0.0000 ug/min | INTRAVENOUS | Status: DC
  Administered 2021-03-11: 5 ug/min via INTRAVENOUS
  Administered 2021-03-11: 40 ug/min via INTRAVENOUS
  Filled 2021-03-11 (×2): qty 250

## 2021-03-11 MED ORDER — RAMIPRIL 2.5 MG PO CAPS
2.5000 mg | ORAL_CAPSULE | Freq: Two times a day (BID) | ORAL | Status: DC
  Administered 2021-03-11 – 2021-03-14 (×6): 2.5 mg via ORAL
  Filled 2021-03-11 (×8): qty 1

## 2021-03-11 MED ORDER — RAMIPRIL 2.5 MG PO CAPS
2.5000 mg | ORAL_CAPSULE | Freq: Every day | ORAL | Status: DC
  Administered 2021-03-11: 2.5 mg via ORAL
  Filled 2021-03-11: qty 1

## 2021-03-11 MED ORDER — ALBUTEROL SULFATE (2.5 MG/3ML) 0.083% IN NEBU: 2.5000 mg | INHALATION_SOLUTION | Freq: Four times a day (QID) | RESPIRATORY_TRACT | Status: DC | PRN

## 2021-03-11 MED ORDER — METOPROLOL SUCCINATE ER 50 MG PO TB24
50.0000 mg | ORAL_TABLET | Freq: Every day | ORAL | Status: DC
  Administered 2021-03-11 – 2021-03-14 (×4): 50 mg via ORAL
  Filled 2021-03-11 (×4): qty 1

## 2021-03-11 MED ORDER — ASPIRIN EC 81 MG PO TBEC
81.0000 mg | DELAYED_RELEASE_TABLET | Freq: Every day | ORAL | Status: DC
  Administered 2021-03-11 – 2021-03-14 (×4): 81 mg via ORAL
  Filled 2021-03-11 (×4): qty 1

## 2021-03-11 MED ORDER — POTASSIUM CHLORIDE CRYS ER 20 MEQ PO TBCR
40.0000 meq | EXTENDED_RELEASE_TABLET | Freq: Once | ORAL | Status: AC
  Administered 2021-03-11: 40 meq via ORAL

## 2021-03-11 MED ORDER — ALBUTEROL SULFATE HFA 108 (90 BASE) MCG/ACT IN AERS: 2.0000 | INHALATION_SPRAY | Freq: Four times a day (QID) | RESPIRATORY_TRACT | Status: DC | PRN

## 2021-03-11 MED ORDER — HYDRALAZINE HCL 25 MG PO TABS
25.0000 mg | ORAL_TABLET | Freq: Three times a day (TID) | ORAL | Status: DC
  Administered 2021-03-11 – 2021-03-14 (×9): 25 mg via ORAL
  Filled 2021-03-11 (×9): qty 1

## 2021-03-11 MED ORDER — ACETAMINOPHEN 325 MG PO TABS: 650.0000 mg | ORAL_TABLET | Freq: Four times a day (QID) | ORAL | Status: DC | PRN

## 2021-03-11 NOTE — Progress Notes (Signed)
[facility] NAME: Roth Hopf    MR#:  [id number]  DATE OF BIRTH:  Jan 09, 1934  SUBJECTIVE:   Via Spanish video interpreter. Patient son in the room came in with increasing shortness of breath for one day along with leg swelling. Patient sitting at the edge of the bed eating his lunch. Tells me he feels a lot better. His sats are hundred percent on 4 L nasal cannula oxygen. Denies any chest pain. REVIEW OF SYSTEMS:   Review of Systems  Constitutional: Negative for chills, fever and weight loss.  HENT: Negative for ear discharge, ear pain and nosebleeds.   Eyes: Negative for blurred vision, pain and discharge.  Respiratory: Positive for cough and shortness of breath. Negative for sputum production, wheezing and stridor.   Cardiovascular: Positive for leg swelling. Negative for chest pain, palpitations, orthopnea and PND.  Gastrointestinal: Negative for abdominal pain, diarrhea, nausea and vomiting.  Genitourinary: Negative for frequency and urgency.  Musculoskeletal: Negative for back pain and joint pain.  Neurological: Negative for sensory change, speech change, focal weakness and weakness.  Psychiatric/Behavioral: Negative for depression and hallucinations. The patient is not nervous/anxious.    Tolerating Diet:yes Tolerating PT: self ambulatory  DRUG ALLERGIES:  No Known Allergies  VITALS:  Blood pressure (!) 155/63, pulse 64, temperature 98.8 F (37.1 C), temperature source Oral, resp. rate (!) 28, SpO2 97 %.  PHYSICAL EXAMINATION:   Physical Exam  GENERAL:  87 y.o.-year-old patient lying in the bed with no acute distress.  HEENT: Head atraumatic, normocephalic. Oropharynx and nasopharynx clear.  NECK:  Supple, + jugular venous distention. No thyroid enlargement, no tenderness.  LUNGS: Normal breath sounds bilaterally, no wheezing, ++ rales No use of accessory muscles of respiration.  CARDIOVASCULAR: S1, S2  normal. No murmurs, rubs, or gallops.  ABDOMEN: Soft, nontender, nondistended. Bowel sounds present. No organomegaly or mass.  EXTREMITIES: No cyanosis, clubbing or edema b/l.    NEUROLOGIC: non focal PSYCHIATRIC:  patient is alert and oriented x 3.  SKIN: per RN  LABORATORY PANEL:  CBC Recent Labs  Lab 03/11/21 0517  WBC 11.8*  HGB 10.8*  HCT 32.6*  PLT 187    Chemistries  Recent Labs  Lab 03/11/21 0517  NA 136  K 3.5  CL 104  CO2 25  GLUCOSE 107*  BUN 32*  CREATININE 1.46*  CALCIUM 8.1*  MG 1.8   Cardiac Enzymes No results for input(s): TROPONINI in the last 168 hours. RADIOLOGY:  DG Chest Portable 1 View  Result Date: 03/11/2021 CLINICAL DATA:  Cough and shortness of breath. EXAM: PORTABLE CHEST 1 VIEW COMPARISON:  December 28, 2016 FINDINGS: Multiple sternal wires are seen. Moderate to marked severity areas of atelectasis and/or infiltrate are seen within the bilateral lung bases and mid left lung. A small right pleural effusion is present. No pneumothorax is identified. The cardiac silhouette is mildly enlarged. Moderate severity calcification of the aortic arch is seen. Degenerative changes seen throughout the thoracic spine. IMPRESSION: 1. Cardiomegaly with moderate to marked severity areas of bilateral atelectasis and/or infiltrate. 2. Small right pleural effusion. Electronically Signed   By: Virgina Norfolk M.D.   On: 03/11/2021 00:03   ASSESSMENT AND PLAN:  Amadi Sibayan is a 87 y.o. male with history of CAD status post CABG, history of chronic systolic heart failure last EF measured in April 2022 had shown improved EF with history of chronic kidney disease stage IV and anemia and  COPD presents to the ER with complaints of increasing shortness of breath since last night.  Acute on chronic diastolic CHF  --last EF measured was 50 to 55% with moderate aortic/mitral/tricuspid valve regurgitation done in April 2022 at [facility] cardiology clinic. --  Patient has been  given Lasix 40 mg IV I have placed patient on 60 mg IV every 12 and patient is also on nitroglycerin infusion.  -- Closely follow intake output metabolic panel daily weights.  -- Patient on ACE inhibitor.  Hypertensive urgency likely contributing to patient's symptoms for which patient has been started on nitroglycerin infusion.  -- Patient is also on Lasix.   --On ramipril and metoprolol as outpatient which will be continued.   --6/6--add po hydralazine and try wean nitro gtt down  Chest pain history of CAD status post CABG.   --Will trend cardiac markers -- continue antiplatelet agents statins and beta-blockers for now.   --At the time of my exam patient chest pain-free.  Chronically disease stage IV -- creatinine appears to be at baseline (1.5).  Closely monitor given that patient is on IV Lasix and on ACE inhibitor. -- Consider nephrology consultation if needed  Normocytic normochromic anemia likely from renal disease hemoglobin appears to be at baseline when compared to recent past.  Follow CBC.  History of COPD presently not actively wheezing.      Procedures: Family communication : son in the room Consults : CODE STATUS: full DVT Prophylaxis : heparin Level of care: Progressive Cardiac Status is: Inpatient  Remains inpatient appropriate because:Inpatient level of care appropriate due to severity of illness   Dispo: The patient is from: Home              Anticipated d/c is to: Home              Patient currently is not medically stable to d/c.   Difficult to place patient No        TOTAL TIME TAKING CARE OF THIS PATIENT: *35 minutes.  >50% time spent on counselling and coordination of care  Note: This dictation was prepared with Dragon dictation along with smaller phrase technology. Any transcriptional errors that result from this process are unintentional.  Fritzi Mandes M.D    [facility]   CC: Primary care physician; [name], MDPatient  ID: Edgardo Roys, male   DOB: 1934/06/17, 87 y.o.   MRN: [id number]

## 2021-03-11 NOTE — ED Notes (Signed)
Per MD will titrate nitro down

## 2021-03-11 NOTE — H&P (Signed)
History and Physical    Daniel Bolton [id number] DOB: 03-20-1934 DOA: 03/10/2021  PCP: [name], MD  Patient coming from: Home.  Chief Complaint: Shortness of breath.  Spanish translation used.  HPI: Daniel Bolton is a 87 y.o. male with history of CAD status post CABG, history of chronic systolic heart failure last EF measured in April 2022 had shown improved EF with history of chronic kidney disease stage IV and anemia and COPD presents to the ER with complaints of increasing shortness of breath since last night.  Patient states he has been compliant with his medications.  I do not see any diuretic in his medication box.  Patient's shortness of breath started acutely last night and has been associated with some retrosternal chest pressure.  Has been having some nonproductive cough and shortness of breath is present even at rest increased on lying down and movements.  Has bilateral lower extremity edema.  ED Course: In the ER patient is found to have bilateral lower extremity edema with elevated JVD and hypoxic requiring 2 L oxygen with chest x-ray shows pleural effusion with possible infiltrates procalcitonin levels were negative and COVID test was negative.  BNP was 2600, high sensitive troponin was 46.  Blood pressure systolic was more than [id number].  Patient was started on Lasix 40 mg IV and nitroglycerin infusion.  On exam patient is still short of breath I placed patient on an additional 60 of Lasix.  Review of Systems: As per HPI, rest all negative.   Past Medical History:  Diagnosis Date  . Hypertension   . Stroke [facility])     Past Surgical History:  Procedure Laterality Date  . BRAIN SURGERY    . CARDIAC SURGERY       reports that he has never smoked. He has never used smokeless tobacco. He reports that he does not drink alcohol and does not use drugs.  No Known Allergies  History reviewed. No pertinent family history.  Prior to Admission medications    Medication Sig Start Date End Date Taking? Authorizing Provider  albuterol (VENTOLIN HFA) 108 (90 Base) MCG/ACT inhaler Inhale 2 puffs into the lungs every 6 (six) hours as needed for wheezing or shortness of breath.   Yes [provider]  clopidogrel (PLAVIX) 75 MG tablet Take 75 mg by mouth daily.   Yes [provider]  furosemide (LASIX) 20 MG tablet Take 20 mg by mouth daily.   Yes [provider]  metoprolol succinate (TOPROL-XL) 50 MG 24 hr tablet Take 50 mg by mouth daily. Take with or immediately following a meal.   Yes [provider]  ramipril (ALTACE) 2.5 MG capsule Take 2.5 mg by mouth daily.   Yes [provider]  simvastatin (ZOCOR) 80 MG tablet Take 80 mg by mouth daily.   Yes [provider]    Physical Exam: Constitutional: Moderately built and nourished. Vitals:   03/10/21 2333 03/11/21 0125 03/11/21 0130  BP: (!) 201/106 (!) 222/98 (!) 191/101  Pulse: 80 88 80  Resp: 20 (!) 28 (!) 21  Temp: 98.8 F (37.1 C)    TempSrc: Oral    SpO2: 96% 94% 92%   Eyes: Anicteric no pallor. ENMT: No discharge from the ears eyes nose and mouth. Neck: Elevated JVD no mass felt. Respiratory: No rhonchi or crepitations. Cardiovascular: S1-S2 heard. Abdomen: Soft nontender bowel sounds present. Musculoskeletal: Bilateral lower extremity edema present. Skin: No rash. Neurologic: Alert awake oriented to time place and person.  Moves all extremities. Psychiatric: Appears normal.  Normal affect.   Labs on Admission: I have personally reviewed following labs and imaging studies  CBC: Recent Labs  Lab 03/10/21 2342  WBC 10.8*  NEUTROABS 7.9*  HGB 11.3*  HCT 35.1*  MCV 87.5  PLT 99991111   Basic Metabolic Panel: Recent Labs  Lab 03/10/21 2342  NA 135  K 3.9  CL 102  CO2 26  GLUCOSE 121*  BUN 35*  CREATININE 1.55*  CALCIUM 8.5*   GFR: CrCl cannot be calculated (Unknown ideal weight.). Liver Function Tests: No results  for input(s): AST, ALT, ALKPHOS, BILITOT, PROT, ALBUMIN in the last 168 hours. No results for input(s): LIPASE, AMYLASE in the last 168 hours. No results for input(s): AMMONIA in the last 168 hours. Coagulation Profile: No results for input(s): INR, PROTIME in the last 168 hours. Cardiac Enzymes: No results for input(s): CKTOTAL, CKMB, CKMBINDEX, TROPONINI in the last 168 hours. BNP (last 3 results) No results for input(s): PROBNP in the last 8760 hours. HbA1C: No results for input(s): HGBA1C in the last 72 hours. CBG: No results for input(s): GLUCAP in the last 168 hours. Lipid Profile: No results for input(s): CHOL, HDL, LDLCALC, TRIG, CHOLHDL, LDLDIRECT in the last 72 hours. Thyroid Function Tests: No results for input(s): TSH, T4TOTAL, FREET4, T3FREE, THYROIDAB in the last 72 hours. Anemia Panel: No results for input(s): VITAMINB12, FOLATE, FERRITIN, TIBC, IRON, RETICCTPCT in the last 72 hours. Urine analysis:    Component Value Date/Time   COLORURINE STRAW (A) 12/11/2020 1015   APPEARANCEUR CLEAR (A) 12/11/2020 1015   LABSPEC 1.005 12/11/2020 1015   PHURINE 5.0 12/11/2020 1015   GLUCOSEU NEGATIVE 12/11/2020 1015   HGBUR NEGATIVE 12/11/2020 1015   BILIRUBINUR NEGATIVE 12/11/2020 1015   KETONESUR 5 (A) 12/11/2020 1015   PROTEINUR NEGATIVE 12/11/2020 1015   NITRITE NEGATIVE 12/11/2020 1015   LEUKOCYTESUR NEGATIVE 12/11/2020 1015   Sepsis Labs: '@LABRCNTIP'$ (procalcitonin:4,lacticidven:4) ) Recent Results (from the past 240 hour(s))  Resp Panel by RT-PCR (Flu A&B, Covid) Nasopharyngeal Swab     Status: None   Collection Time: 03/10/21 11:42 PM   Specimen: Nasopharyngeal Swab; Nasopharyngeal(NP) swabs in vial transport medium  Result Value Ref Range Status   SARS Coronavirus 2 by RT PCR NEGATIVE NEGATIVE Final    Comment: (NOTE) SARS-CoV-2 target nucleic acids are NOT DETECTED.  The SARS-CoV-2 RNA is generally detectable in upper respiratory specimens during the acute  phase of infection. The lowest concentration of SARS-CoV-2 viral copies this assay can detect is 138 copies/mL. A negative result does not preclude SARS-Cov-2 infection and should not be used as the sole basis for treatment or other patient management decisions. A negative result may occur with  improper specimen collection/handling, submission of specimen other than nasopharyngeal swab, presence of viral mutation(s) within the areas targeted by this assay, and inadequate number of viral copies(<138 copies/mL). A negative result must be combined with clinical observations, patient history, and epidemiological information. The expected result is Negative.  Fact Sheet for Patients:  EntrepreneurPulse.com.au  Fact Sheet for Healthcare Providers:  IncredibleEmployment.be  This test is no t yet approved or cleared by the [place] FDA and  has been authorized for detection and/or diagnosis of SARS-CoV-2 by FDA under an Emergency Use Authorization (EUA). This EUA will remain  in effect (meaning this test can be used) for the duration of the COVID-19 declaration under Section 564(b)(1) of the Act, 21 U.S.C.section 360bbb-3(b)(1), unless the authorization is terminated  or revoked sooner.  Influenza A by PCR NEGATIVE NEGATIVE Final   Influenza B by PCR NEGATIVE NEGATIVE Final    Comment: (NOTE) The Xpert Xpress SARS-CoV-2/FLU/RSV plus assay is intended as an aid in the diagnosis of influenza from Nasopharyngeal swab specimens and should not be used as a sole basis for treatment. Nasal washings and aspirates are unacceptable for Xpert Xpress SARS-CoV-2/FLU/RSV testing.  Fact Sheet for Patients: EntrepreneurPulse.com.au  Fact Sheet for Healthcare Providers: IncredibleEmployment.be  This test is not yet approved or cleared by the [place] FDA and has been authorized for detection and/or diagnosis of  SARS-CoV-2 by FDA under an Emergency Use Authorization (EUA). This EUA will remain in effect (meaning this test can be used) for the duration of the COVID-19 declaration under Section 564(b)(1) of the Act, 21 U.S.C. section 360bbb-3(b)(1), unless the authorization is terminated or revoked.  Performed at [facility], [address], [place], [place] [address]      Radiological Exams on Admission: DG Chest Portable 1 View  Result Date: 03/11/2021 CLINICAL DATA:  Cough and shortness of breath. EXAM: PORTABLE CHEST 1 VIEW COMPARISON:  December 28, 2016 FINDINGS: Multiple sternal wires are seen. Moderate to marked severity areas of atelectasis and/or infiltrate are seen within the bilateral lung bases and mid left lung. A small right pleural effusion is present. No pneumothorax is identified. The cardiac silhouette is mildly enlarged. Moderate severity calcification of the aortic arch is seen. Degenerative changes seen throughout the thoracic spine. IMPRESSION: 1. Cardiomegaly with moderate to marked severity areas of bilateral atelectasis and/or infiltrate. 2. Small right pleural effusion. Electronically Signed   By: Virgina Norfolk M.D.   On: 03/11/2021 00:03    EKG: Independently reviewed.  Normal sinus with PVCs.  Assessment/Plan Principal Problem:   Acute on chronic systolic CHF (congestive heart failure) (HCC) Active Problems:   Hypertensive urgency   CKD (chronic kidney disease), stage IV (HCC)   Normochromic normocytic anemia   Acute CHF (congestive heart failure) ([place])    1. Acute on chronic CHF last EF measured was 50 to 55% with moderate aortic/mitral/tricuspid valve regurgitation done in April 2022 at [facility] cardiology clinic.  Patient has been given Lasix 40 mg IV I have placed patient on 60 mg IV every 12 and patient is also on nitroglycerin infusion.  Closely follow intake output metabolic panel daily weights.  Patient on ACE inhibitor. 2. Hypertensive urgency likely  contributing to patient's symptoms for which patient has been started on nitroglycerin infusion.  Patient is also on Lasix.  On ramipril and metoprolol as outpatient which will be continued.  Follow blood pressure trends. 3. Chest pain history of CAD status post CABG.  Will trend cardiac markers continue antiplatelet agents statins and beta-blockers for now.  At the time of my exam patient chest pain-free. 4. Chronically disease stage IV creatinine appears to be at baseline.  Closely monitor given that patient is on IV Lasix and on ACE inhibitor. 5. Normocytic normochromic anemia likely from renal disease hemoglobin appears to be at baseline when compared to recent past.  Follow CBC. 6. History of COPD presently not actively wheezing. 7. Recent accidental ingestion of isopropyl alcohol about 2 months ago.  Given that patient has hypertensive urgency with CHF symptoms will need close monitoring for further management and inpatient status.   DVT prophylaxis: Heparin. Code Status: Full code. Family Communication: Patient's son. Disposition Plan: Home. Consults called: We will consult cardiology. Admission status: Inpatient.   Rise Patience MD [facility] Pager  [place] [phone].  If 7PM-7AM, please contact night-coverage [email or web address] Password [email or web address]  03/11/2021, 1:58 AM

## 2021-03-11 NOTE — ED Provider Notes (Signed)
[facility] Emergency Department Provider Note  ____________________________________________  Time seen: Approximately 1:31 AM  I have reviewed the triage vital signs and the nursing notes.   HISTORY  Chief Complaint Shortness of Breath (Sob, cough x 1 day )   HPI Daniel Bolton is a 87 y.o. male with a history of hypertension, CHF with EF of 50 to 55%, CAD on Plavix who presents for evaluation of shortness of breath.  Patient has had several days of progressively worsening orthopnea, bilateral lower extremity swelling, cough productive of clear phlegm.  Over the last 24 hours has had progressively worsening shortness of breath.  He complains of feeling very weak.  No fever or chills, no sore throat, no chest pain, no vomiting or diarrhea.  He endorses compliance with his medications.   Past Medical History:  Diagnosis Date  . Hypertension   . Stroke [facility])     Patient Active Problem List   Diagnosis Date Noted  . Acute on chronic systolic CHF (congestive heart failure) ([place]) 03/11/2021  . Moderate major depression, single episode ([place]) 08/01/2015  . Coronary artery disease 08/01/2015  . Financial problems 08/01/2015    Past Surgical History:  Procedure Laterality Date  . BRAIN SURGERY    . CARDIAC SURGERY      Prior to Admission medications   Medication Sig Start Date End Date Taking? Authorizing Provider  albuterol (VENTOLIN HFA) 108 (90 Base) MCG/ACT inhaler Inhale 2 puffs into the lungs every 6 (six) hours as needed for wheezing or shortness of breath.   Yes [provider]  clopidogrel (PLAVIX) 75 MG tablet Take 75 mg by mouth daily.   Yes [provider]  furosemide (LASIX) 20 MG tablet Take 20 mg by mouth daily.   Yes [provider]  metoprolol succinate (TOPROL-XL) 50 MG 24 hr tablet Take 50 mg by mouth daily. Take with or immediately following a meal.   Yes [provider]  ramipril (ALTACE) 2.5 MG  capsule Take 2.5 mg by mouth daily.   Yes [provider]  simvastatin (ZOCOR) 80 MG tablet Take 80 mg by mouth daily.   Yes [provider]    Allergies Patient has no known allergies.  History reviewed. No pertinent family history.  Social History Social History   Tobacco Use  . Smoking status: Never Smoker  . Smokeless tobacco: Never Used  Substance Use Topics  . Alcohol use: No  . Drug use: No    Review of Systems  Constitutional: Negative for fever. Eyes: Negative for visual changes. ENT: Negative for sore throat. Neck: No neck pain  Cardiovascular: Negative for chest pain. + orthopnea Respiratory: + shortness of breath, cough Gastrointestinal: Negative for abdominal pain, vomiting or diarrhea. Genitourinary: Negative for dysuria. Musculoskeletal: Negative for back pain. + b/l leg swelling Skin: Negative for rash. Neurological: Negative for headaches, weakness or numbness. Psych: No SI or HI  ____________________________________________   PHYSICAL EXAM:  VITAL SIGNS: ED Triage Vitals [03/10/21 2333]  Enc Vitals Group     BP (!) 201/106     Pulse Rate 80     Resp 20     Temp 98.8 F (37.1 C)     Temp Source Oral     SpO2 96 %     Weight      Height      Head Circumference      Peak Flow      Pain Score 0     Pain  Loc      Pain Edu?      Excl. in [place]?     Constitutional: Alert and oriented, mild respiratory distress.  HEENT:      Head: Normocephalic and atraumatic.         Eyes: Conjunctivae are normal. Sclera is non-icteric.       Mouth/Throat: Mucous membranes are moist.       Neck: Supple with no signs of meningismus. Cardiovascular: Regular rate and rhythm. No murmurs, gallops, or rubs. 2+ symmetrical distal pulses are present in all extremities.  Respiratory: Increased work of breathing, hypoxic to 87% on room air requiring 2 L of oxygen, crackles bilaterally with some expiratory wheezes on the left.  Gastrointestinal: Soft,  non tender, and non distended with positive bowel sounds. No rebound or guarding. Genitourinary: No CVA tenderness. Musculoskeletal: 2+ pitting edema bilaterally Neurologic: Normal speech and language. Face is symmetric. Moving all extremities. No gross focal neurologic deficits are appreciated. Skin: Skin is warm, dry and intact. No rash noted. Psychiatric: Mood and affect are normal. Speech and behavior are normal.  ____________________________________________   LABS (all labs ordered are listed, but only abnormal results are displayed)  Labs Reviewed  CBC WITH DIFFERENTIAL/PLATELET - Abnormal; Notable for the following components:      Result Value   WBC 10.8 (*)    RBC 4.01 (*)    Hemoglobin 11.3 (*)    HCT 35.1 (*)    Neutro Abs 7.9 (*)    All other components within normal limits  BASIC METABOLIC PANEL - Abnormal; Notable for the following components:   Glucose, Bld 121 (*)    BUN 35 (*)    Creatinine, Ser 1.55 (*)    Calcium 8.5 (*)    GFR, Estimated 44 (*)    All other components within normal limits  TROPONIN I (HIGH SENSITIVITY) - Abnormal; Notable for the following components:   Troponin I (High Sensitivity) 46 (*)    All other components within normal limits  RESP PANEL BY RT-PCR (FLU A&B, COVID) ARPGX2  BRAIN NATRIURETIC PEPTIDE  PROCALCITONIN  TROPONIN I (HIGH SENSITIVITY)   ____________________________________________  EKG  ED ECG REPORT I, Rudene Re, the attending physician, personally viewed and interpreted this ECG.  Sinus rhythm with a rate of 76, frequent PVCs, LVH with no ST elevations. ____________________________________________  RADIOLOGY  I have personally reviewed the images performed during this visit and I agree with the Radiologist's read.   Interpretation by Radiologist:  DG Chest Portable 1 View  Result Date: 03/11/2021 CLINICAL DATA:  Cough and shortness of breath. EXAM: PORTABLE CHEST 1 VIEW COMPARISON:  December 28, 2016  FINDINGS: Multiple sternal wires are seen. Moderate to marked severity areas of atelectasis and/or infiltrate are seen within the bilateral lung bases and mid left lung. A small right pleural effusion is present. No pneumothorax is identified. The cardiac silhouette is mildly enlarged. Moderate severity calcification of the aortic arch is seen. Degenerative changes seen throughout the thoracic spine. IMPRESSION: 1. Cardiomegaly with moderate to marked severity areas of bilateral atelectasis and/or infiltrate. 2. Small right pleural effusion. Electronically Signed   By: Virgina Norfolk M.D.   On: 03/11/2021 00:03     ____________________________________________   PROCEDURES  Procedure(s) performed:yes .1-3 Lead EKG Interpretation Performed by: Rudene Re, MD Authorized by: Rudene Re, MD     Interpretation: abnormal     ECG rate assessment: normal     Rhythm: sinus rhythm     Ectopy: PVCs  Conduction: normal     Critical Care performed: yes  CRITICAL CARE Performed by: Rudene Re  ?  Total critical care time: 35 min  Critical care time was exclusive of separately billable procedures and treating other patients.  Critical care was necessary to treat or prevent imminent or life-threatening deterioration.  Critical care was time spent personally by me on the following activities: development of treatment plan with patient and/or surrogate as well as nursing, discussions with consultants, evaluation of patient's response to treatment, examination of patient, obtaining history from patient or surrogate, ordering and performing treatments and interventions, ordering and review of laboratory studies, ordering and review of radiographic studies, pulse oximetry and re-evaluation of patient's condition.  ____________________________________________   INITIAL IMPRESSION / ASSESSMENT AND PLAN / ED COURSE  87 y.o. male with a history of hypertension, CHF with EF  of 50 to 55%, CAD on Plavix who presents for evaluation of shortness of breath.  Patient arrives in mild respiratory distress, hypoxic requiring 2 L of oxygen, looks grossly volume overloaded with elevated JVD, pitting edema, crackles bilaterally and wheezing.  Differential diagnosis includes acute CHF exacerbation, flash pulmonary edema, CAD, hypertensive emergency, pneumonia, flu, COVID.  Chest x-ray visualized by me showing cardiomegaly with edema and effusion, confirmed by radiology.  COVID and flu negative.  Troponin slightly elevated at 46 but not too far from patient's baseline which is usually in the mid to upper 30s.  He continues to deny chest pain and EKG shows no signs of acute ischemia.  Creatinine seems to be improved when compared to prior blood work from March at 1.55.  Patient given 40 mg of IV Lasix.  Started on nitro drip for severely elevated blood pressure to help reduce afterload.  BNP and procalcitonin are pending.  If Pro-Cal is elevated will cover with antibiotics as well.  Will admit to the hospitalist service for acute respiratory failure from CHF exacerbation and hypertensive emergency.  Patient placed on telemetry for close monitoring of cardiorespiratory status.  History gathered from patient and he is family member who is at bedside.  Plan discussed with both of them.     _____________________________________________ Please note:  Patient was evaluated in Emergency Department today for the symptoms described in the history of present illness. Patient was evaluated in the context of the global COVID-19 pandemic, which necessitated consideration that the patient might be at risk for infection with the SARS-CoV-2 virus that causes COVID-19. Institutional protocols and algorithms that pertain to the evaluation of patients at risk for COVID-19 are in a state of rapid change based on information released by regulatory bodies including the CDC and federal and state organizations.  These policies and algorithms were followed during the patient's care in the ED.  Some ED evaluations and interventions may be delayed as a result of limited staffing during the pandemic.   [place] Controlled Substance Database was reviewed by me. ____________________________________________   FINAL CLINICAL IMPRESSION(S) / ED DIAGNOSES   Final diagnoses:  Acute respiratory failure with hypoxia (HCC)  Acute on chronic congestive heart failure, unspecified heart failure type [facility])  Hypertensive emergency      NEW MEDICATIONS STARTED DURING THIS VISIT:  ED Discharge Orders    None       Note:  This document was prepared using Dragon voice recognition software and may include unintentional dictation errors.    Alfred Levins, [place], MD 03/11/21 [phone]

## 2021-03-11 NOTE — ED Notes (Signed)
Pt assisted to the bathroom.

## 2021-03-12 ENCOUNTER — Other Ambulatory Visit: Payer: Self-pay

## 2021-03-12 MED ORDER — FUROSEMIDE 10 MG/ML IJ SOLN
20.0000 mg | Freq: Two times a day (BID) | INTRAMUSCULAR | Status: DC
  Administered 2021-03-13: 20 mg via INTRAVENOUS
  Filled 2021-03-12: qty 2

## 2021-03-12 MED ORDER — PNEUMOCOCCAL VAC POLYVALENT 25 MCG/0.5ML IJ INJ
0.5000 mL | INJECTION | INTRAMUSCULAR | Status: AC
  Administered 2021-03-13: 0.5 mL via INTRAMUSCULAR
  Filled 2021-03-12: qty 0.5

## 2021-03-12 NOTE — Progress Notes (Signed)
CSW consulted for heart failure screen, completed by heart failure RN on this day.   CSW will continue to follow for potential discharge planning needs.   [place], [place]

## 2021-03-12 NOTE — Progress Notes (Signed)
[facility] [place] NAME: Daniel Bolton    MR#:  [id number]  DATE OF BIRTH:  15-Dec-1933  SUBJECTIVE:   Via Spanish video interpreter. Patient feels a whole lot better. He is breathing easily. His Nitro drip is weaned off. Blood pressure much improved. Complains of urinating a lot and cramping in his legs. REVIEW OF SYSTEMS:   Review of Systems  Constitutional: Negative for chills, fever and weight loss.  HENT: Negative for ear discharge, ear pain and nosebleeds.   Eyes: Negative for blurred vision, pain and discharge.  Respiratory: Positive for cough and shortness of breath. Negative for sputum production, wheezing and stridor.   Cardiovascular: Positive for leg swelling. Negative for chest pain, palpitations, orthopnea and PND.  Gastrointestinal: Negative for abdominal pain, diarrhea, nausea and vomiting.  Genitourinary: Negative for frequency and urgency.  Musculoskeletal: Negative for back pain and joint pain.  Neurological: Negative for sensory change, speech change, focal weakness and weakness.  Psychiatric/Behavioral: Negative for depression and hallucinations. The patient is not nervous/anxious.    Tolerating Diet:yes Tolerating PT: self ambulatory  DRUG ALLERGIES:  No Known Allergies  VITALS:  Blood pressure (!) 133/57, pulse (!) 59, temperature 97.7 F (36.5 C), resp. rate 17, weight 54.8 kg, SpO2 98 %.  PHYSICAL EXAMINATION:   Physical Exam  GENERAL:  87 y.o.-year-old patient lying in the bed with no acute distress.  HEENT: Head atraumatic, normocephalic. Oropharynx and nasopharynx clear.  NECK:  Supple, no  jugular venous distention. No thyroid enlargement, no tenderness.  LUNGS: Normal breath sounds bilaterally, no wheezing, + rales No use of accessory muscles of respiration.  CARDIOVASCULAR: S1, S2 normal. No murmurs, rubs, or gallops.  ABDOMEN: Soft, nontender, nondistended. Bowel sounds present. No  organomegaly or mass.  EXTREMITIES: No cyanosis, clubbing or edema b/l.    NEUROLOGIC: non focal PSYCHIATRIC:  patient is alert and oriented x 3.  SKIN: per RN  LABORATORY PANEL:  CBC Recent Labs  Lab 03/11/21 0517  WBC 11.8*  HGB 10.8*  HCT 32.6*  PLT 187    Chemistries  Recent Labs  Lab 03/11/21 0517  NA 136  K 3.5  CL 104  CO2 25  GLUCOSE 107*  BUN 32*  CREATININE 1.46*  CALCIUM 8.1*  MG 1.8   Cardiac Enzymes No results for input(s): TROPONINI in the last 168 hours. RADIOLOGY:  DG Chest Portable 1 View  Result Date: 03/11/2021 CLINICAL DATA:  Cough and shortness of breath. EXAM: PORTABLE CHEST 1 VIEW COMPARISON:  December 28, 2016 FINDINGS: Multiple sternal wires are seen. Moderate to marked severity areas of atelectasis and/or infiltrate are seen within the bilateral lung bases and mid left lung. A small right pleural effusion is present. No pneumothorax is identified. The cardiac silhouette is mildly enlarged. Moderate severity calcification of the aortic arch is seen. Degenerative changes seen throughout the thoracic spine. IMPRESSION: 1. Cardiomegaly with moderate to marked severity areas of bilateral atelectasis and/or infiltrate. 2. Small right pleural effusion. Electronically Signed   By: Virgina Norfolk M.D.   On: 03/11/2021 00:03   ASSESSMENT AND PLAN:  Daniel Bolton is a 87 y.o. male with history of CAD status post CABG, history of chronic systolic heart failure last EF measured in April 2022 had shown improved EF with history of chronic kidney disease stage IV and anemia and COPD presents to the ER with complaints of increasing shortness of breath since last night.  Acute on chronic diastolic CHF  --  last EF measured was 50 to 55% with moderate aortic/mitral/tricuspid valve regurgitation done in April 2022 at [facility] cardiology clinic. --   now off nitroglycerin infusion.  -- IV Lasix 60 BID-- still 20 mg IV BID since patient is improved clinically having a  lot of leg cramps -- Closely follow intake output metabolic panel daily weights.  -- Patient on ACE inhibitor.  Hypertensive urgency likely contributing to patient's symptoms for which patient has been started on nitroglycerin infusion.  -- Patient is also on Lasix.   --On ramipril and metoprolol as outpatient which will be continued.   --6/6--add po hydralazine and try wean nitro gtt down --6/7-- Nitro drip off. Blood pressure much improved  Chest pain history of CAD status post CABG.   -- continue antiplatelet agents statins and beta-blockers for now.   --At the time of my exam patient chest pain-free.  Chronically disease stage IV -- creatinine appears to be at baseline (1.5).  Closely monitor given that patient is on IV Lasix and on ACE inhibitor. -- Consider nephrology consultation if needed  Normocytic normochromic anemia likely from renal disease hemoglobin appears to be at baseline when compared to recent past.  Follow CBC.  History of COPD presently not actively wheezing.    Procedures: Family communication : son 6/6 Consults : none CODE STATUS: full DVT Prophylaxis : heparin subcu Level of care: Progressive Cardiac Status is: Inpatient  Remains inpatient appropriate because:Inpatient level of care appropriate due to severity of illness   Dispo: The patient is from: Home              Anticipated d/c is to: Home              Patient currently is not medically stable to d/c.   Difficult to place patient No   Overall clinically improving. Discharge tomorrow with oral diuretics and cardiac meds if continues to show improvement.     TOTAL TIME TAKING CARE OF THIS PATIENT: *35 minutes.  >50% time spent on counselling and coordination of care  Note: This dictation was prepared with Dragon dictation along with smaller phrase technology. Any transcriptional errors that result from this process are unintentional.  Fritzi Mandes M.D    [facility]    CC: Primary care physician; [name], MDPatient ID: Daniel Bolton, male   DOB: 05-04-1934, 87 y.o.   MRN: [id number]

## 2021-03-12 NOTE — Consult Note (Signed)
   Heart Failure Nurse Navigator Note  HFpEF 50-55%  He presented to the ED with complaints of increasing SOB and lower extremity edema.  Co morbidities:  Coronary artery disease/CABG Chronic kidney disease stage IV Anemia COPD  Labs:  Sodium 136, potassium 3.5, chloride 104, CO2 25, BUN 32, creatinine 1.46 Intake 240 mL Output 2450 mL Weight 54.8 kg Blood pressure 133/57  Assessment:  General- he is awake and alert,c/o  leg cramps, lying supine.  Cardiac-heart tones are regular.  Chest-  Musculoskeletal-  No lower extremity edema noted.     Initial meeting with patient today.  Through Spanish interpreter [phone] was able to speak with patient today.  At that visit he complained about leg cramps, needing  to be redirected several times and as he wanted to discuss his insurance with [facility] and wanting  more services to help him home.  He states that he tried to change a lightbulb in the bathroom the other day and fell between the stool and bath tub.  He states that he lives alone, in a  mobile home.  And he does the cooking.  States that he eats things like pork and chicken.  He does admit to using salt at the table and through the interpreter told him that that should be something that should be removed from the table.  States that he takes his medications as ordered.  Spoke with patients nurse regarding him needing to be redirected and she states it is better when family members are present as they keep him on track.  He was given the living with heart failure handbook,(spanish version)  instructed on follow up appointment in the outpatient heart failure clinic.  Pricilla Riffle RN [facility]

## 2021-03-12 NOTE — Plan of Care (Signed)

## 2021-03-12 NOTE — Plan of Care (Signed)
  Problem: Clinical Measurements: Goal: Will remain free from infection Outcome: Progressing   Problem: Activity: Goal: Risk for activity intolerance will decrease Outcome: Progressing   Problem: Coping: Goal: Level of anxiety will decrease Outcome: Progressing   

## 2021-03-13 LAB — BASIC METABOLIC PANEL
Anion gap: 11 (ref 5–15)
BUN: 36 mg/dL — ABNORMAL HIGH (ref 8–23)
CO2: 26 mmol/L (ref 22–32)
Calcium: 8.5 mg/dL — ABNORMAL LOW (ref 8.9–10.3)
Chloride: 97 mmol/L — ABNORMAL LOW (ref 98–111)
Creatinine, Ser: 1.44 mg/dL — ABNORMAL HIGH (ref 0.61–1.24)
GFR, Estimated: 48 mL/min — ABNORMAL LOW (ref >60)
Glucose, Bld: 85 mg/dL (ref 70–99)
Potassium: 3.1 mmol/L — ABNORMAL LOW (ref 3.5–5.1)
Sodium: 134 mmol/L — ABNORMAL LOW (ref 135–145)

## 2021-03-13 LAB — CBC
HCT: 31.7 % — ABNORMAL LOW (ref 39.0–52.0)
Hemoglobin: 10.6 g/dL — ABNORMAL LOW (ref 13.0–17.0)
MCH: 28.4 pg (ref 26.0–34.0)
MCHC: 33.4 g/dL (ref 30.0–36.0)
MCV: 85 fL (ref 80.0–100.0)
Platelets: 175 10*3/uL (ref 150–400)
RBC: 3.73 MIL/uL — ABNORMAL LOW (ref 4.22–5.81)
RDW: 13.8 % (ref 11.5–15.5)
WBC: 6.4 10*3/uL (ref 4.0–10.5)
nRBC: 0 % (ref 0.0–0.2)

## 2021-03-13 MED ORDER — POTASSIUM CHLORIDE CRYS ER 20 MEQ PO TBCR
40.0000 meq | EXTENDED_RELEASE_TABLET | Freq: Two times a day (BID) | ORAL | Status: DC
  Administered 2021-03-13 – 2021-03-14 (×2): 40 meq via ORAL
  Filled 2021-03-13 (×2): qty 2

## 2021-03-13 MED ORDER — FUROSEMIDE 10 MG/ML IJ SOLN
60.0000 mg | Freq: Two times a day (BID) | INTRAMUSCULAR | Status: DC
  Administered 2021-03-13: 60 mg via INTRAVENOUS
  Filled 2021-03-13: qty 6

## 2021-03-13 MED ORDER — POTASSIUM CHLORIDE CRYS ER 20 MEQ PO TBCR: 20.0000 meq | EXTENDED_RELEASE_TABLET | Freq: Two times a day (BID) | ORAL | Status: DC

## 2021-03-13 MED ORDER — POTASSIUM CHLORIDE CRYS ER 20 MEQ PO TBCR
40.0000 meq | EXTENDED_RELEASE_TABLET | Freq: Once | ORAL | Status: AC
  Administered 2021-03-13: 40 meq via ORAL
  Filled 2021-03-13: qty 2

## 2021-03-13 NOTE — Progress Notes (Signed)
[facility] PROGRESS NOTE    Daniel Bolton  [id number] DOB: 10/28/1933 DOA: 03/10/2021 PCP: [name], MD      Brief Narrative:  [name] is a 87 y.o. M with CHF EF 35-40% recently recovered to 50%, CKD IIIb baseline Cr 1.6-1.9, hx stroke without residuals, and HTN who presented with     Past Medical History:  Diagnosis Date  . Hypertension   . Stroke [facility])       Assessment & Plan:  Acute on chronic systolic and diastolic CHF Prior Q000111Q, most recent echo EF up to 50%, also grade I DD.  Here presented with shortness of breath, leg swelling, cardiomegaly on chest x-ray, and bilateral infiltrates as well as pleural effusion.  Started on IV Lasix, diuresed only 600 yesterday, net negative 2.8L.  Cr stable, K lowering. Today, desatted to 85% with ambulating and had severe dyspnea with walking just 20 feet.  -Continue Furosemide incresae back to 60 mg IV twice a day  -Start K supplement -Strict I/Os, daily weights, telemetry  -Daily monitoring renal function   -Continue ACEi, hydralazine, metoprolol   Cerebrovascular disease, secondary prevention Hypertension Hypertensive urgency With CHF and severe range hypertension, blood pressure controlled, blood pressure is now normal. -Continue Plavix, aspirin, statin - Continue ramipril, metoprolol, hydralazine  CKD IIIb CKD IV ruled out, his more recent elevated Creatinines were transient, his baseline appears to be 1.5-1.9  Anemia of chronic kidney disease Hemoglobin stable  hypoKalemia - Supplement potassium       Disposition: Status is: Inpatient  Remains inpatient appropriate because:IV treatments appropriate due to intensity of illness or inability to take PO   Dispo: The patient is from: Home              Anticipated d/c is to: Home              Patient currently is not medically stable to d/c.   Difficult to place patient No    Admitted Due to CHF flare, now  improving but still very symptomatic.  Continue IV Lasix   Level of care: Med-Surg       MDM: The below labs and imaging reports were reviewed and summarized above.  Medication management as above.  Sev exac chron illness, acute illness (CHF)   DVT prophylaxis: heparin injection 5,000 Units Start: 03/11/21 0600  Code Status: FULL Family Communication: None present           Subjective: Still short of breath.  Leg swelling resolved.  Orthopnea better.  No confusion, no fever, no chest pain.  Objective: Vitals:   03/13/21 1033 03/13/21 1058 03/13/21 1200 03/13/21 1402  BP:   (!) 135/57 (!) 120/45  Pulse: 61 (!) 58 (!) 58 (!) 57  Resp:   16   Temp:   97.7 F (36.5 C)   TempSrc:      SpO2: 99% 99% 98% 95%  Weight:        Intake/Output Summary (Last 24 hours) at 03/13/2021 1458 Last data filed at 03/13/2021 1408 Gross per 24 hour  Intake 1334.51 ml  Output 1150 ml  Net 184.51 ml   Filed Weights   03/12/21 0500 03/13/21 0500  Weight: 54.8 kg 55.8 kg    Examination: General appearance:  adult male, alert and in no acute distress.   HEENT: Anicteric, conjunctiva pink, lids and lashes normal. No nasal deformity, discharge, epistaxis.   Skin: Warm and dry.  no jaundice.  No suspicious rashes or lesions.  Cardiac: RRR, nl Q000111Q, soft systolic murmur.  Capillary refill is brisk.  JVP normal.  No LE edema.  Radial pulses 2+ and symmetric. Respiratory: Normal respiratory rate and rhythm.  Rales at bases.  No wheezing. Abdomen: Abdomen soft.  No TTP. No ascites, distension, hepatosplenomegaly.   MSK: No deformities or effusions. Neuro: Awake and alert.  EOMI, moves all extremities. Speech fluent.    Psych: Sensorium intact and responding to questions, attention normal. Affect normal.  Judgment and insight appear normal.    Data Reviewed: I have personally reviewed following labs and imaging studies:  CBC: Recent Labs  Lab 03/10/21 2342 03/11/21 0517  03/13/21 0517  WBC 10.8* 11.8* 6.4  NEUTROABS 7.9*  --   --   HGB 11.3* 10.8* 10.6*  HCT 35.1* 32.6* 31.7*  MCV 87.5 87.2 85.0  PLT 180 187 0000000   Basic Metabolic Panel: Recent Labs  Lab 03/10/21 2342 03/11/21 0517 03/13/21 0517  NA 135 136 134*  K 3.9 3.5 3.1*  CL 102 104 97*  CO2 '26 25 26  '$ GLUCOSE 121* 107* 85  BUN 35* 32* 36*  CREATININE 1.55* 1.46* 1.44*  CALCIUM 8.5* 8.1* 8.5*  MG  --  1.8  --    GFR: Estimated Creatinine Clearance: 29.6 mL/min (A) (by C-G formula based on SCr of 1.44 mg/dL (H)). Liver Function Tests: No results for input(s): AST, ALT, ALKPHOS, BILITOT, PROT, ALBUMIN in the last 168 hours. No results for input(s): LIPASE, AMYLASE in the last 168 hours. No results for input(s): AMMONIA in the last 168 hours. Coagulation Profile: No results for input(s): INR, PROTIME in the last 168 hours. Cardiac Enzymes: No results for input(s): CKTOTAL, CKMB, CKMBINDEX, TROPONINI in the last 168 hours. BNP (last 3 results) No results for input(s): PROBNP in the last 8760 hours. HbA1C: No results for input(s): HGBA1C in the last 72 hours. CBG: No results for input(s): GLUCAP in the last 168 hours. Lipid Profile: No results for input(s): CHOL, HDL, LDLCALC, TRIG, CHOLHDL, LDLDIRECT in the last 72 hours. Thyroid Function Tests: Recent Labs    03/11/21 0517  TSH 1.410   Anemia Panel: No results for input(s): VITAMINB12, FOLATE, FERRITIN, TIBC, IRON, RETICCTPCT in the last 72 hours. Urine analysis:    Component Value Date/Time   COLORURINE STRAW (A) 12/11/2020 1015   APPEARANCEUR CLEAR (A) 12/11/2020 1015   LABSPEC 1.005 12/11/2020 1015   PHURINE 5.0 12/11/2020 1015   GLUCOSEU NEGATIVE 12/11/2020 1015   HGBUR NEGATIVE 12/11/2020 1015   BILIRUBINUR NEGATIVE 12/11/2020 1015   KETONESUR 5 (A) 12/11/2020 1015   PROTEINUR NEGATIVE 12/11/2020 1015   NITRITE NEGATIVE 12/11/2020 1015   LEUKOCYTESUR NEGATIVE 12/11/2020 1015   Sepsis  Labs: '@LABRCNTIP'$ (procalcitonin:4,lacticacidven:4)  ) Recent Results (from the past 240 hour(s))  Resp Panel by RT-PCR (Flu A&B, Covid) Nasopharyngeal Swab     Status: None   Collection Time: 03/10/21 11:42 PM   Specimen: Nasopharyngeal Swab; Nasopharyngeal(NP) swabs in vial transport medium  Result Value Ref Range Status   SARS Coronavirus 2 by RT PCR NEGATIVE NEGATIVE Final    Comment: (NOTE) SARS-CoV-2 target nucleic acids are NOT DETECTED.  The SARS-CoV-2 RNA is generally detectable in upper respiratory specimens during the acute phase of infection. The lowest concentration of SARS-CoV-2 viral copies this assay can detect is 138 copies/mL. A negative result does not preclude SARS-Cov-2 infection and should not be used as the sole basis for treatment or other patient management decisions. A negative result may occur with  improper  specimen collection/handling, submission of specimen other than nasopharyngeal swab, presence of viral mutation(s) within the areas targeted by this assay, and inadequate number of viral copies(<138 copies/mL). A negative result must be combined with clinical observations, patient history, and epidemiological information. The expected result is Negative.  Fact Sheet for Patients:  EntrepreneurPulse.com.au  Fact Sheet for Healthcare Providers:  IncredibleEmployment.be  This test is no t yet approved or cleared by the [place] FDA and  has been authorized for detection and/or diagnosis of SARS-CoV-2 by FDA under an Emergency Use Authorization (EUA). This EUA will remain  in effect (meaning this test can be used) for the duration of the COVID-19 declaration under Section 564(b)(1) of the Act, 21 U.S.C.section 360bbb-3(b)(1), unless the authorization is terminated  or revoked sooner.       Influenza A by PCR NEGATIVE NEGATIVE Final   Influenza B by PCR NEGATIVE NEGATIVE Final    Comment: (NOTE) The Xpert  Xpress SARS-CoV-2/FLU/RSV plus assay is intended as an aid in the diagnosis of influenza from Nasopharyngeal swab specimens and should not be used as a sole basis for treatment. Nasal washings and aspirates are unacceptable for Xpert Xpress SARS-CoV-2/FLU/RSV testing.  Fact Sheet for Patients: EntrepreneurPulse.com.au  Fact Sheet for Healthcare Providers: IncredibleEmployment.be  This test is not yet approved or cleared by the [place] FDA and has been authorized for detection and/or diagnosis of SARS-CoV-2 by FDA under an Emergency Use Authorization (EUA). This EUA will remain in effect (meaning this test can be used) for the duration of the COVID-19 declaration under Section 564(b)(1) of the Act, 21 U.S.C. section 360bbb-3(b)(1), unless the authorization is terminated or revoked.  Performed at [facility], [address] [place], [place] [address]          Radiology Studies: No results found.      Scheduled Meds: . aspirin EC  81 mg Oral Daily  . atorvastatin  40 mg Oral Daily  . clopidogrel  75 mg Oral Daily  . furosemide  20 mg Intravenous BID  . heparin  5,000 Units Subcutaneous Q8H  . hydrALAZINE  25 mg Oral Q8H  . metoprolol succinate  50 mg Oral Q breakfast  . [START ON 03/14/2021] potassium chloride  20 mEq Oral BID  . ramipril  2.5 mg Oral BID   Continuous Infusions:   LOS: 2 days    Time spent: 35 minutes    Edwin Dada, MD [facility] 03/13/2021, 2:58 PM     Please page though [place] or Epic secure chat:  For [employer], Adult nurse

## 2021-03-14 DIAGNOSIS — D649 Anemia, unspecified: Secondary | ICD-10-CM

## 2021-03-14 DIAGNOSIS — N184 Chronic kidney disease, stage 4 (severe): Secondary | ICD-10-CM

## 2021-03-14 LAB — BASIC METABOLIC PANEL
Anion gap: 9 (ref 5–15)
BUN: 39 mg/dL — ABNORMAL HIGH (ref 8–23)
CO2: 26 mmol/L (ref 22–32)
Calcium: 8.4 mg/dL — ABNORMAL LOW (ref 8.9–10.3)
Chloride: 100 mmol/L (ref 98–111)
Creatinine, Ser: 1.7 mg/dL — ABNORMAL HIGH (ref 0.61–1.24)
GFR, Estimated: 39 mL/min — ABNORMAL LOW (ref >60)
Glucose, Bld: 87 mg/dL (ref 70–99)
Potassium: 3.9 mmol/L (ref 3.5–5.1)
Sodium: 135 mmol/L (ref 135–145)

## 2021-03-14 LAB — CBC
HCT: 32.2 % — ABNORMAL LOW (ref 39.0–52.0)
Hemoglobin: 10.8 g/dL — ABNORMAL LOW (ref 13.0–17.0)
MCH: 28.5 pg (ref 26.0–34.0)
MCHC: 33.5 g/dL (ref 30.0–36.0)
MCV: 85 fL (ref 80.0–100.0)
Platelets: 191 10*3/uL (ref 150–400)
RBC: 3.79 MIL/uL — ABNORMAL LOW (ref 4.22–5.81)
RDW: 13.8 % (ref 11.5–15.5)
WBC: 6.5 10*3/uL (ref 4.0–10.5)
nRBC: 0 % (ref 0.0–0.2)

## 2021-03-14 MED ORDER — HYDRALAZINE HCL 25 MG PO TABS
25.0000 mg | ORAL_TABLET | Freq: Three times a day (TID) | ORAL | 0 refills | Status: DC
Start: 2021-03-14 — End: 2021-04-14

## 2021-03-14 MED ORDER — HYDRALAZINE HCL 25 MG PO TABS: 25.0000 mg | ORAL_TABLET | Freq: Three times a day (TID) | ORAL | 3 refills | Status: DC

## 2021-03-14 MED ORDER — ASPIRIN 81 MG PO TBEC: 81.0000 mg | DELAYED_RELEASE_TABLET | Freq: Every day | ORAL | 11 refills | Status: DC

## 2021-03-14 MED ORDER — FUROSEMIDE 20 MG PO TABS: 20.0000 mg | ORAL_TABLET | Freq: Every day | ORAL | 0 refills | Status: DC

## 2021-03-14 NOTE — Discharge Summary (Signed)
Physician Discharge Summary  Daniel Bolton [place] [id number] DOB: 05/18/1934 DOA: 03/10/2021  PCP: [name], MD  Admit date: 03/10/2021 Discharge date: 03/14/2021  Admitted From: Home  Disposition:  Home   Recommendations for Outpatient Follow-up:  Follow up with Dr. Ubaldo Glassing or HF clinic in 1-2 weeks Please obtain BMP in one week     Home Health: None  Equipment/Devices: None new  Discharge Condition: Good  CODE STATUS: FULL Diet recommendation: Cardiac, low sodium  Brief/Interim Summary: Daniel Bolton is a 87 y.o. M with CHF EF 35-40% recently recovered to 50%, CAD s/p CABG, CKD IIIb baseline Cr 1.6-1.9, hx stroke without residuals, and HTN who presented with 1 to 2 days increase shortness of breath, as well as nonproductive cough, and chest pressure and lower extremity edema.     PRINCIPAL HOSPITAL DIAGNOSIS: Hypertensive emergency causing acute on chronic systolic and diastolic CHF    Discharge Diagnoses:   Acute on chronic systolic and diastolic CHF Prior Q000111Q, most recent echo EF up to 50%, also grade I DD.   Here presented with shortness of breath, leg swelling, cardiomegaly on chest x-ray, and bilateral infiltrates as well as pleural effusion.   Started on IV Lasix, diuresed 3.4L to weight 55.2kg.  Cr slight bump and IV Lasix stopped.   Discharged to resume home diuretics, antihypertensives.  Get BMP in 1 week.      Cerebrovascular disease, secondary prevention Hypertension Hypertensive emergency He presented with severe range hypertension, symptoms of CHF.  Blood pressure was controlled, he was resumed on ramipril, metoprolol, and started on new hydralazine and his blood pressures were well controlled.    Plavix and statin continued.  Aspirin started.    CKD IIIb CKD IV ruled out, his more recent elevated Creatinines were transient, his baseline appears to be 1.5-1.9   Anemia of chronic kidney disease No change from baseline.    Hypokalemia Repleted             Discharge Instructions  Discharge Instructions     (HEART FAILURE PATIENTS) Call MD:  Anytime you have any of the following symptoms: 1) 3 pound weight gain in 24 hours or 5 pounds in 1 week 2) shortness of breath, with or without a dry hacking cough 3) swelling in the hands, feet or stomach 4) if you have to sleep on extra pillows at night in order to breathe.   Complete by: As directed    AMB referral to CHF clinic   Complete by: As directed    Diet - low sodium heart healthy   Complete by: As directed    Discharge instructions   Complete by: As directed    From Dr. Loleta Books: You were admitted for heart failure and severe high blood pressure. You should take the new blood pressure medicine hydralazine 25 mg three times daily  You should continue your normal metoprolol and ramipril  Go see Dr. Ubaldo Glassing as soon as you can get an appointment  Weigh yourself every day.    If your weight ever increases more than 5 lbs from your normal weight, call Dr. Ubaldo Glassing right away.  Start taking baby aspirin, this is good for your heart.   Increase activity slowly   Complete by: As directed       Allergies as of 03/14/2021   No Known Allergies      Medication List     TAKE these medications    albuterol 108 (90 Base) MCG/ACT inhaler Commonly known as: VENTOLIN HFA Inhale  2 puffs into the lungs every 6 (six) hours as needed for wheezing or shortness of breath.   aspirin 81 MG EC tablet Take 1 tablet (81 mg total) by mouth daily. Swallow whole.   clopidogrel 75 MG tablet Commonly known as: PLAVIX Take 75 mg by mouth daily.   furosemide 20 MG tablet Commonly known as: LASIX Take 20 mg by mouth daily.   hydrALAZINE 25 MG tablet Commonly known as: APRESOLINE Take 1 tablet (25 mg total) by mouth every 8 (eight) hours.   metoprolol succinate 50 MG 24 hr tablet Commonly known as: TOPROL-XL Take 50 mg by mouth daily. Take with or immediately  following a meal.   ramipril 2.5 MG capsule Commonly known as: ALTACE Take 2.5 mg by mouth daily.   simvastatin 80 MG tablet Commonly known as: ZOCOR Take 80 mg by mouth daily.        Follow-up Information     Fath, Javier Docker, MD. Schedule an appointment as soon as possible for a visit in 1 week(s).   Specialty: Cardiology Contact information: [place] [address] [phone]                No Known Allergies     Procedures/Studies: DG Chest Portable 1 View  Result Date: 03/11/2021 CLINICAL DATA:  Cough and shortness of breath. EXAM: PORTABLE CHEST 1 VIEW COMPARISON:  December 28, 2016 FINDINGS: Multiple sternal wires are seen. Moderate to marked severity areas of atelectasis and/or infiltrate are seen within the bilateral lung bases and mid left lung. A small right pleural effusion is present. No pneumothorax is identified. The cardiac silhouette is mildly enlarged. Moderate severity calcification of the aortic arch is seen. Degenerative changes seen throughout the thoracic spine. IMPRESSION: 1. Cardiomegaly with moderate to marked severity areas of bilateral atelectasis and/or infiltrate. 2. Small right pleural effusion. Electronically Signed   By: Virgina Norfolk M.D.   On: 03/11/2021 00:03      Subjective: Patient still has a dry cough, no headache, chest pain, significant dyspnea with exertion, leg swelling, fever.  Discharge Exam: Vitals:   03/13/21 1947 03/14/21 0620  BP: (!) 154/59 (!) 140/56  Pulse: 65 (!) 58  Resp: 18 18  Temp: 98.1 F (36.7 C) 98.9 F (37.2 C)  SpO2: 93% 91%   Vitals:   03/13/21 1636 03/13/21 1642 03/13/21 1947 03/14/21 0620  BP:  (!) 160/60 (!) 154/59 (!) 140/56  Pulse: (!) 56 (!) 59 65 (!) 58  Resp:  (!) '21 18 18  '$ Temp:   98.1 F (36.7 C) 98.9 F (37.2 C)  TempSrc:      SpO2: 96% 91% 93% 91%  Weight:    55.2 kg  Height:    6' (1.829 m)    General: Pt is alert, awake, not in acute  distress Cardiovascular: RRR, nl S1-S2, no murmurs appreciated.   No LE edema.   Respiratory: Normal respiratory rate and rhythm.  CTAB without rales or wheezes. Abdominal: Abdomen soft and non-tender.  No distension or HSM.   Neuro/Psych: Strength symmetric in upper and lower extremities.  Judgment and insight appear normal.   The results of significant diagnostics from this hospitalization (including imaging, microbiology, ancillary and laboratory) are listed below for reference.     Microbiology: Recent Results (from the past 240 hour(s))  Resp Panel by RT-PCR (Flu A&B, Covid) Nasopharyngeal Swab     Status: None   Collection Time: 03/10/21 11:42 PM   Specimen: Nasopharyngeal Swab; Nasopharyngeal(NP)  swabs in vial transport medium  Result Value Ref Range Status   SARS Coronavirus 2 by RT PCR NEGATIVE NEGATIVE Final    Comment: (NOTE) SARS-CoV-2 target nucleic acids are NOT DETECTED.  The SARS-CoV-2 RNA is generally detectable in upper respiratory specimens during the acute phase of infection. The lowest concentration of SARS-CoV-2 viral copies this assay can detect is 138 copies/mL. A negative result does not preclude SARS-Cov-2 infection and should not be used as the sole basis for treatment or other patient management decisions. A negative result may occur with  improper specimen collection/handling, submission of specimen other than nasopharyngeal swab, presence of viral mutation(s) within the areas targeted by this assay, and inadequate number of viral copies(<138 copies/mL). A negative result must be combined with clinical observations, patient history, and epidemiological information. The expected result is Negative.  Fact Sheet for Patients:  EntrepreneurPulse.com.au  Fact Sheet for Healthcare Providers:  IncredibleEmployment.be  This test is no t yet approved or cleared by the [place] FDA and  has been authorized for  detection and/or diagnosis of SARS-CoV-2 by FDA under an Emergency Use Authorization (EUA). This EUA will remain  in effect (meaning this test can be used) for the duration of the COVID-19 declaration under Section 564(b)(1) of the Act, 21 U.S.C.section 360bbb-3(b)(1), unless the authorization is terminated  or revoked sooner.       Influenza A by PCR NEGATIVE NEGATIVE Final   Influenza B by PCR NEGATIVE NEGATIVE Final    Comment: (NOTE) The Xpert Xpress SARS-CoV-2/FLU/RSV plus assay is intended as an aid in the diagnosis of influenza from Nasopharyngeal swab specimens and should not be used as a sole basis for treatment. Nasal washings and aspirates are unacceptable for Xpert Xpress SARS-CoV-2/FLU/RSV testing.  Fact Sheet for Patients: EntrepreneurPulse.com.au  Fact Sheet for Healthcare Providers: IncredibleEmployment.be  This test is not yet approved or cleared by the [place] FDA and has been authorized for detection and/or diagnosis of SARS-CoV-2 by FDA under an Emergency Use Authorization (EUA). This EUA will remain in effect (meaning this test can be used) for the duration of the COVID-19 declaration under Section 564(b)(1) of the Act, 21 U.S.C. section 360bbb-3(b)(1), unless the authorization is terminated or revoked.  Performed at [facility], [address] [place], [place] [address]      Labs: BNP (last 3 results) Recent Labs    04/20/20 1515 03/10/21 2342  BNP 1,006.7* 123456*   Basic Metabolic Panel: Recent Labs  Lab 03/10/21 2342 03/11/21 0517 03/13/21 0517 03/14/21 0615  NA 135 136 134* 135  K 3.9 3.5 3.1* 3.9  CL 102 104 97* 100  CO2 '26 25 26 26  '$ GLUCOSE 121* 107* 85 87  BUN 35* 32* 36* 39*  CREATININE 1.55* 1.46* 1.44* 1.70*  CALCIUM 8.5* 8.1* 8.5* 8.4*  MG  --  1.8  --   --    Liver Function Tests: No results for input(s): AST, ALT, ALKPHOS, BILITOT, PROT, ALBUMIN in the last 168  hours. No results for input(s): LIPASE, AMYLASE in the last 168 hours. No results for input(s): AMMONIA in the last 168 hours. CBC: Recent Labs  Lab 03/10/21 2342 03/11/21 0517 03/13/21 0517 03/14/21 0615  WBC 10.8* 11.8* 6.4 6.5  NEUTROABS 7.9*  --   --   --   HGB 11.3* 10.8* 10.6* 10.8*  HCT 35.1* 32.6* 31.7* 32.2*  MCV 87.5 87.2 85.0 85.0  PLT 180 187 175 191   Cardiac Enzymes: No results for input(s): CKTOTAL,  CKMB, CKMBINDEX, TROPONINI in the last 168 hours. BNP: Invalid input(s): POCBNP CBG: No results for input(s): GLUCAP in the last 168 hours. D-Dimer No results for input(s): DDIMER in the last 72 hours. Hgb A1c No results for input(s): HGBA1C in the last 72 hours. Lipid Profile No results for input(s): CHOL, HDL, LDLCALC, TRIG, CHOLHDL, LDLDIRECT in the last 72 hours. Thyroid function studies No results for input(s): TSH, T4TOTAL, T3FREE, THYROIDAB in the last 72 hours.  Invalid input(s): FREET3 Anemia work up No results for input(s): VITAMINB12, FOLATE, FERRITIN, TIBC, IRON, RETICCTPCT in the last 72 hours. Urinalysis    Component Value Date/Time   COLORURINE STRAW (A) 12/11/2020 1015   APPEARANCEUR CLEAR (A) 12/11/2020 1015   LABSPEC 1.005 12/11/2020 1015   PHURINE 5.0 12/11/2020 1015   GLUCOSEU NEGATIVE 12/11/2020 1015   HGBUR NEGATIVE 12/11/2020 1015   [place] NEGATIVE 12/11/2020 1015   KETONESUR 5 (A) 12/11/2020 1015   PROTEINUR NEGATIVE 12/11/2020 1015   NITRITE NEGATIVE 12/11/2020 1015   LEUKOCYTESUR NEGATIVE 12/11/2020 1015   Sepsis Labs Invalid input(s): PROCALCITONIN,  WBC,  LACTICIDVEN Microbiology Recent Results (from the past 240 hour(s))  Resp Panel by RT-PCR (Flu A&B, Covid) Nasopharyngeal Swab     Status: None   Collection Time: 03/10/21 11:42 PM   Specimen: Nasopharyngeal Swab; Nasopharyngeal(NP) swabs in vial transport medium  Result Value Ref Range Status   SARS Coronavirus 2 by RT PCR NEGATIVE NEGATIVE Final    Comment:  (NOTE) SARS-CoV-2 target nucleic acids are NOT DETECTED.  The SARS-CoV-2 RNA is generally detectable in upper respiratory specimens during the acute phase of infection. The lowest concentration of SARS-CoV-2 viral copies this assay can detect is 138 copies/mL. A negative result does not preclude SARS-Cov-2 infection and should not be used as the sole basis for treatment or other patient management decisions. A negative result may occur with  improper specimen collection/handling, submission of specimen other than nasopharyngeal swab, presence of viral mutation(s) within the areas targeted by this assay, and inadequate number of viral copies(<138 copies/mL). A negative result must be combined with clinical observations, patient history, and epidemiological information. The expected result is Negative.  Fact Sheet for Patients:  EntrepreneurPulse.com.au  Fact Sheet for Healthcare Providers:  IncredibleEmployment.be  This test is no t yet approved or cleared by the [place] FDA and  has been authorized for detection and/or diagnosis of SARS-CoV-2 by FDA under an Emergency Use Authorization (EUA). This EUA will remain  in effect (meaning this test can be used) for the duration of the COVID-19 declaration under Section 564(b)(1) of the Act, 21 U.S.C.section 360bbb-3(b)(1), unless the authorization is terminated  or revoked sooner.       Influenza A by PCR NEGATIVE NEGATIVE Final   Influenza B by PCR NEGATIVE NEGATIVE Final    Comment: (NOTE) The Xpert Xpress SARS-CoV-2/FLU/RSV plus assay is intended as an aid in the diagnosis of influenza from Nasopharyngeal swab specimens and should not be used as a sole basis for treatment. Nasal washings and aspirates are unacceptable for Xpert Xpress SARS-CoV-2/FLU/RSV testing.  Fact Sheet for Patients: EntrepreneurPulse.com.au  Fact Sheet for Healthcare  Providers: IncredibleEmployment.be  This test is not yet approved or cleared by the [place] FDA and has been authorized for detection and/or diagnosis of SARS-CoV-2 by FDA under an Emergency Use Authorization (EUA). This EUA will remain in effect (meaning this test can be used) for the duration of the COVID-19 declaration under Section 564(b)(1) of the Act, 21 U.S.C. section 360bbb-3(b)(1),  unless the authorization is terminated or revoked.  Performed at [facility], [address] [place], [place] [address]      Time coordinating discharge: 35 minutes The [place] controlled substances registry was reviewed for this patient     30 Day Unplanned Readmission Risk Score    Flowsheet Row ED to Hosp-Admission (Current) from 03/10/2021 in [facility]  30 Day Unplanned Readmission Risk Score (%) 17.04 Filed at 03/14/2021 0400       This score is the patient's risk of an unplanned readmission within 30 days of being discharged (0 -100%). The score is based on dignosis, age, lab data, medications, orders, and past utilization.   Low:  0-14.9   Medium: 15-21.9   High: 22-29.9   Extreme: 30 and above             SIGNED:   Edwin Dada, MD  [facility] 03/14/2021, 8:08 AM

## 2021-03-21 ENCOUNTER — Inpatient Hospital Stay
Admission: EM | Admit: 2021-03-21 | Discharge: 2021-03-25 | DRG: 291 | Disposition: A | Discharge status: Home / Self Care | Payer: Medicare Other | Attending: Internal Medicine | Admitting: Internal Medicine

## 2021-03-21 ENCOUNTER — Other Ambulatory Visit: Payer: Self-pay

## 2021-03-21 DIAGNOSIS — E861 Hypovolemia: Secondary | ICD-10-CM | POA: Diagnosis not present

## 2021-03-21 DIAGNOSIS — N179 Acute kidney failure, unspecified: Secondary | ICD-10-CM | POA: Diagnosis present

## 2021-03-21 DIAGNOSIS — F321 Major depressive disorder, single episode, moderate: Secondary | ICD-10-CM | POA: Diagnosis not present

## 2021-03-21 DIAGNOSIS — Z7902 Long term (current) use of antithrombotics/antiplatelets: Secondary | ICD-10-CM

## 2021-03-21 DIAGNOSIS — I5043 Acute on chronic combined systolic (congestive) and diastolic (congestive) heart failure: Secondary | ICD-10-CM | POA: Diagnosis present

## 2021-03-21 DIAGNOSIS — E785 Hyperlipidemia, unspecified: Secondary | ICD-10-CM | POA: Diagnosis not present

## 2021-03-21 DIAGNOSIS — I509 Heart failure, unspecified: Secondary | ICD-10-CM

## 2021-03-21 DIAGNOSIS — Z7982 Long term (current) use of aspirin: Secondary | ICD-10-CM | POA: Diagnosis not present

## 2021-03-21 DIAGNOSIS — Z20822 Contact with and (suspected) exposure to covid-19: Secondary | ICD-10-CM | POA: Diagnosis present

## 2021-03-21 DIAGNOSIS — Z951 Presence of aortocoronary bypass graft: Secondary | ICD-10-CM | POA: Diagnosis not present

## 2021-03-21 DIAGNOSIS — Z79899 Other long term (current) drug therapy: Secondary | ICD-10-CM

## 2021-03-21 DIAGNOSIS — I2510 Atherosclerotic heart disease of native coronary artery without angina pectoris: Secondary | ICD-10-CM | POA: Diagnosis not present

## 2021-03-21 DIAGNOSIS — I10 Essential (primary) hypertension: Secondary | ICD-10-CM

## 2021-03-21 DIAGNOSIS — I160 Hypertensive urgency: Secondary | ICD-10-CM | POA: Diagnosis not present

## 2021-03-21 DIAGNOSIS — K5900 Constipation, unspecified: Secondary | ICD-10-CM | POA: Diagnosis not present

## 2021-03-21 DIAGNOSIS — J810 Acute pulmonary edema: Secondary | ICD-10-CM

## 2021-03-21 DIAGNOSIS — K219 Gastro-esophageal reflux disease without esophagitis: Secondary | ICD-10-CM | POA: Diagnosis present

## 2021-03-21 DIAGNOSIS — R059 Cough, unspecified: Secondary | ICD-10-CM | POA: Diagnosis present

## 2021-03-21 DIAGNOSIS — I130 Hypertensive heart and chronic kidney disease with heart failure and stage 1 through stage 4 chronic kidney disease, or unspecified chronic kidney disease: Principal | ICD-10-CM | POA: Diagnosis present

## 2021-03-21 DIAGNOSIS — I69331 Monoplegia of upper limb following cerebral infarction affecting right dominant side: Secondary | ICD-10-CM | POA: Diagnosis not present

## 2021-03-21 DIAGNOSIS — E871 Hypo-osmolality and hyponatremia: Secondary | ICD-10-CM | POA: Diagnosis not present

## 2021-03-21 DIAGNOSIS — D649 Anemia, unspecified: Secondary | ICD-10-CM | POA: Diagnosis present

## 2021-03-21 DIAGNOSIS — N184 Chronic kidney disease, stage 4 (severe): Secondary | ICD-10-CM | POA: Diagnosis present

## 2021-03-21 DIAGNOSIS — R0602 Shortness of breath: Secondary | ICD-10-CM

## 2021-03-21 DIAGNOSIS — I5033 Acute on chronic diastolic (congestive) heart failure: Secondary | ICD-10-CM

## 2021-03-21 DIAGNOSIS — E876 Hypokalemia: Secondary | ICD-10-CM | POA: Diagnosis present

## 2021-03-21 NOTE — ED Provider Notes (Signed)
[facility] Emergency Department Provider Note   ____________________________________________   Event Date/Time   First MD Initiated Contact with Patient 03/21/21 2342     (approximate)  I have reviewed the triage vital signs and the nursing notes.   HISTORY  Chief Complaint Hypertension and Cough    HPI Daniel Bolton is a 87 y.o. male with the below stated past medical history presents via EMS from home complaining of cough for the last 1 month with associated shortness of breath.  Patient states that he was admitted approximately 1 week ago for similar symptoms.  Patient denies any exacerbating or relieving factors.  Patient states that he is taking his medications on time and as prescribed.  Patient currently denies any vision changes, tinnitus, difficulty speaking, facial droop, sore throat, chest pain, abdominal pain, nausea/vomiting/diarrhea, dysuria, or weakness/numbness/paresthesias in any extremity         Past Medical History:  Diagnosis Date   Hypertension    Stroke [facility])     Patient Active Problem List   Diagnosis Date Noted   Acute on chronic systolic CHF (congestive heart failure) ([place]) 03/11/2021   Hypertensive urgency 03/11/2021   CKD (chronic kidney disease), stage IV ([place]) 03/11/2021   Normochromic normocytic anemia 03/11/2021   Acute CHF (congestive heart failure) ([place]) 03/11/2021   Moderate major depression, single episode ([place]) 08/01/2015   Coronary artery disease 08/01/2015   Financial problems 08/01/2015    Past Surgical History:  Procedure Laterality Date   BRAIN SURGERY     CARDIAC SURGERY      Prior to Admission medications   Medication Sig Start Date End Date Taking? Authorizing Provider  albuterol (VENTOLIN HFA) 108 (90 Base) MCG/ACT inhaler Inhale 2 puffs into the lungs every 6 (six) hours as needed for wheezing or shortness of breath.    [provider]  aspirin EC 81 MG EC tablet Take 1 tablet  (81 mg total) by mouth daily. Swallow whole. 03/14/21   Danford, Suann Larry, MD  clopidogrel (PLAVIX) 75 MG tablet Take 75 mg by mouth daily.    [provider]  furosemide (LASIX) 20 MG tablet Take 1 tablet (20 mg total) by mouth daily. 03/14/21   Danford, Suann Larry, MD  hydrALAZINE (APRESOLINE) 25 MG tablet Take 1 tablet (25 mg total) by mouth every 8 (eight) hours. 03/14/21   Danford, Suann Larry, MD  metoprolol succinate (TOPROL-XL) 50 MG 24 hr tablet Take 50 mg by mouth daily. Take with or immediately following a meal.    [provider]  ramipril (ALTACE) 2.5 MG capsule Take 2.5 mg by mouth daily.    [provider]  simvastatin (ZOCOR) 80 MG tablet Take 80 mg by mouth daily.    [provider]    Allergies Patient has no known allergies.  History reviewed. No pertinent family history.  Social History Social History   Tobacco Use   Smoking status: Never   Smokeless tobacco: Never  Substance Use Topics   Alcohol use: No   Drug use: No    Review of Systems Constitutional: No fever/chills Eyes: No visual changes. ENT: No sore throat.  Endorses cough x1 month Cardiovascular: Denies chest pain. Respiratory: Endorses shortness of breath. Gastrointestinal: No abdominal pain.  No nausea, no vomiting.  No diarrhea. Genitourinary: Negative for dysuria. Musculoskeletal: Negative for acute arthralgias Skin: Negative for rash. Neurological: Negative for headaches, weakness/numbness/paresthesias in any extremity Psychiatric: Negative for suicidal ideation/homicidal ideation   ____________________________________________   PHYSICAL  EXAM:  VITAL SIGNS: ED Triage Vitals  Enc Vitals Group     BP 03/21/21 2349 (!) 184/75     Pulse Rate 03/21/21 2349 62     Resp 03/21/21 2349 (!) 22     Temp 03/21/21 2349 98 F (36.7 C)     Temp Source 03/21/21 2349 Oral     SpO2 03/21/21 2349 98 %     Weight 03/21/21 2351 121 lb 4.1 oz (55 kg)      Height 03/21/21 2351 '5\' 9"'$  (1.753 m)     Head Circumference --      Peak Flow --      Pain Score --      Pain Loc --      Pain Edu? --      Excl. in JA[place]? --    Constitutional: Alert and oriented. Well appearing and in no acute distress. Eyes: Conjunctivae are normal. PERRL. Head: Atraumatic. Nose: No congestion/rhinnorhea. Mouth/Throat: Mucous membranes are moist. Neck: No stridor Cardiovascular: Grossly normal heart sounds.  Good peripheral circulation. Respiratory: Increased respiratory effort.  Rales over bilateral lung bases.  No retractions. Gastrointestinal: Soft and nontender. No distention. Musculoskeletal: No obvious deformities Neurologic:  Normal speech and language. No gross focal neurologic deficits are appreciated. Skin:  Skin is warm and dry. No rash noted. Psychiatric: Mood and affect are normal. Speech and behavior are normal.  ____________________________________________   LABS (all labs ordered are listed, but only abnormal results are displayed)  Labs Reviewed  COMPREHENSIVE METABOLIC PANEL - Abnormal; Notable for the following components:      Result Value   Sodium 125 (*)    Chloride 96 (*)    Glucose, Bld 104 (*)    BUN 31 (*)    Creatinine, Ser 1.28 (*)    Calcium 8.3 (*)    Albumin 3.4 (*)    GFR, Estimated 55 (*)    All other components within normal limits  BRAIN NATRIURETIC PEPTIDE - Abnormal; Notable for the following components:   B Natriuretic Peptide 1,439.0 (*)    All other components within normal limits  CBC WITH DIFFERENTIAL/PLATELET - Abnormal; Notable for the following components:   RBC 3.94 (*)    Hemoglobin 11.0 (*)    HCT 33.7 (*)    All other components within normal limits  RESP PANEL BY RT-PCR (FLU A&B, COVID) ARPGX2  TROPONIN I (HIGH SENSITIVITY)   ____________________________________________   EKG   ED ECG REPORT I, Naaman Plummer, the attending physician, personally viewed and interpreted this ECG.   Date:  03/21/2021 EKG Time: 2347 Rate: 62 Rhythm: normal sinus rhythm QRS Axis: normal Intervals: normal ST/T Wave abnormalities: normal Narrative Interpretation: no evidence of acute ischemia   ____________________________________________   RADIOLOGY   ED MD interpretation: 2 view x-ray of the chest shows cardiomegaly with bilateral pulmonary infiltrates and small effusions consistent with CHF exacerbation   Official radiology report(s): DG Chest 2 View   Result Date: 03/22/2021 CLINICAL DATA:  Shortness of breath and cough for 1 month. EXAM: CHEST - 2 VIEW COMPARISON:  03/10/2021 FINDINGS: Postoperative changes in the mediastinum. Cardiac enlargement with diffuse bilateral airspace and interstitial infiltrates, likely edema. Small bilateral pleural effusions. Similar appearance to previous study. Calcification of the aorta. No pneumothorax. IMPRESSION: Cardiac enlargement with bilateral pulmonary infiltrates and small effusions. Electronically Signed   By: Lucienne Capers M.D.   On: 03/22/2021 00:18     ____________________________________________     PROCEDURES   Procedure(s) performed (including  Critical Care):   Procedures     ____________________________________________     INITIAL IMPRESSION / ASSESSMENT AND PLAN / ED COURSE   As part of my medical decision making, I reviewed the following data within the [place] notes reviewed and incorporated, Labs reviewed, EKG interpreted, Old chart reviewed, Radiograph reviewed and Notes from prior ED visits reviewed and incorporated          + dyspnea +LE edema Denies Non adherence to medication regimen  Workup: ECG, CBC, BMP, Troponin, BNP, CXR Findings: EKG: No STEMI and no evidence of Brugadas sign, delta wave, epsilon wave, significantly prolonged QTc, or malignant arrhythmia. BNP: 1439 CXR: Cardiomegaly with bilateral traits concerning for pulmonary edema and CHF exacerbation Based on history,  exam and findings, presentation most consistent with acute on chronic heart failure. Low suspicion for PNA, ACS, tamponade, aortic dissection. Interventions: Diuresis  Disposition (Stable but not significantly improved): Admit to medicine for further monitoring and for improvement of medication regimen to control symptoms.    FINAL CLINICAL IMPRESSION(S) / ED DIAGNOSES  Final diagnoses:  Shortness of breath  Acute on chronic congestive heart failure, unspecified heart failure type ([place])  Acute pulmonary edema ([place])  Hyponatremia     ED Discharge Orders     None        Note:  This document was prepared using Dragon voice recognition software and may include unintentional dictation errors.    Naaman Plummer, MD 03/22/21 0120

## 2021-03-21 NOTE — ED Triage Notes (Signed)
Pt biba c/o of generalized sickness consisting of cough for 1 week. Pt was recently d/c from here 1 week ago and dx with CHF. Pt is also complaining of hypertension and mildly short of breath.    [employer] interpreter [phone] used

## 2021-03-22 ENCOUNTER — Other Ambulatory Visit: Payer: Self-pay

## 2021-03-22 ENCOUNTER — Emergency Department: Payer: Medicare Other

## 2021-03-22 DIAGNOSIS — I160 Hypertensive urgency: Secondary | ICD-10-CM | POA: Diagnosis not present

## 2021-03-22 DIAGNOSIS — N184 Chronic kidney disease, stage 4 (severe): Secondary | ICD-10-CM | POA: Diagnosis not present

## 2021-03-22 DIAGNOSIS — E876 Hypokalemia: Secondary | ICD-10-CM | POA: Diagnosis not present

## 2021-03-22 DIAGNOSIS — K219 Gastro-esophageal reflux disease without esophagitis: Secondary | ICD-10-CM | POA: Diagnosis not present

## 2021-03-22 DIAGNOSIS — Z79899 Other long term (current) drug therapy: Secondary | ICD-10-CM | POA: Diagnosis not present

## 2021-03-22 DIAGNOSIS — Z7902 Long term (current) use of antithrombotics/antiplatelets: Secondary | ICD-10-CM | POA: Diagnosis not present

## 2021-03-22 DIAGNOSIS — I2510 Atherosclerotic heart disease of native coronary artery without angina pectoris: Secondary | ICD-10-CM | POA: Diagnosis not present

## 2021-03-22 DIAGNOSIS — E861 Hypovolemia: Secondary | ICD-10-CM | POA: Diagnosis not present

## 2021-03-22 DIAGNOSIS — I10 Essential (primary) hypertension: Secondary | ICD-10-CM

## 2021-03-22 DIAGNOSIS — K5900 Constipation, unspecified: Secondary | ICD-10-CM | POA: Diagnosis not present

## 2021-03-22 DIAGNOSIS — D649 Anemia, unspecified: Secondary | ICD-10-CM | POA: Diagnosis not present

## 2021-03-22 DIAGNOSIS — E871 Hypo-osmolality and hyponatremia: Secondary | ICD-10-CM

## 2021-03-22 DIAGNOSIS — I130 Hypertensive heart and chronic kidney disease with heart failure and stage 1 through stage 4 chronic kidney disease, or unspecified chronic kidney disease: Secondary | ICD-10-CM | POA: Diagnosis not present

## 2021-03-22 DIAGNOSIS — E785 Hyperlipidemia, unspecified: Secondary | ICD-10-CM | POA: Diagnosis not present

## 2021-03-22 DIAGNOSIS — I5043 Acute on chronic combined systolic (congestive) and diastolic (congestive) heart failure: Secondary | ICD-10-CM | POA: Diagnosis present

## 2021-03-22 DIAGNOSIS — R059 Cough, unspecified: Secondary | ICD-10-CM | POA: Diagnosis not present

## 2021-03-22 DIAGNOSIS — I69331 Monoplegia of upper limb following cerebral infarction affecting right dominant side: Secondary | ICD-10-CM | POA: Diagnosis not present

## 2021-03-22 DIAGNOSIS — F321 Major depressive disorder, single episode, moderate: Secondary | ICD-10-CM | POA: Diagnosis not present

## 2021-03-22 DIAGNOSIS — Z7982 Long term (current) use of aspirin: Secondary | ICD-10-CM | POA: Diagnosis not present

## 2021-03-22 DIAGNOSIS — Z20822 Contact with and (suspected) exposure to covid-19: Secondary | ICD-10-CM | POA: Diagnosis not present

## 2021-03-22 DIAGNOSIS — Z951 Presence of aortocoronary bypass graft: Secondary | ICD-10-CM | POA: Diagnosis not present

## 2021-03-22 DIAGNOSIS — I5033 Acute on chronic diastolic (congestive) heart failure: Secondary | ICD-10-CM

## 2021-03-22 LAB — CBC WITH DIFFERENTIAL/PLATELET
Abs Immature Granulocytes: 0.02 10*3/uL (ref 0.00–0.07)
Basophils Absolute: 0 10*3/uL (ref 0.0–0.1)
Basophils Relative: 1 %
Eosinophils Absolute: 0.2 10*3/uL (ref 0.0–0.5)
Eosinophils Relative: 2 %
HCT: 33.7 % — ABNORMAL LOW (ref 39.0–52.0)
Hemoglobin: 11 g/dL — ABNORMAL LOW (ref 13.0–17.0)
Immature Granulocytes: 0 %
Lymphocytes Relative: 20 %
Lymphs Abs: 1.5 10*3/uL (ref 0.7–4.0)
MCH: 27.9 pg (ref 26.0–34.0)
MCHC: 32.6 g/dL (ref 30.0–36.0)
MCV: 85.5 fL (ref 80.0–100.0)
Monocytes Absolute: 0.6 10*3/uL (ref 0.1–1.0)
Monocytes Relative: 8 %
Neutro Abs: 5.1 10*3/uL (ref 1.7–7.7)
Neutrophils Relative %: 69 %
Platelets: 244 10*3/uL (ref 150–400)
RBC: 3.94 MIL/uL — ABNORMAL LOW (ref 4.22–5.81)
RDW: 13.5 % (ref 11.5–15.5)
WBC: 7.4 10*3/uL (ref 4.0–10.5)
nRBC: 0 % (ref 0.0–0.2)

## 2021-03-22 LAB — COMPREHENSIVE METABOLIC PANEL
ALT: 30 U/L (ref 0–44)
AST: 32 U/L (ref 15–41)
Albumin: 3.4 g/dL — ABNORMAL LOW (ref 3.5–5.0)
Alkaline Phosphatase: 110 U/L (ref 38–126)
Anion gap: 7 (ref 5–15)
BUN: 31 mg/dL — ABNORMAL HIGH (ref 8–23)
CO2: 22 mmol/L (ref 22–32)
Calcium: 8.3 mg/dL — ABNORMAL LOW (ref 8.9–10.3)
Chloride: 96 mmol/L — ABNORMAL LOW (ref 98–111)
Creatinine, Ser: 1.28 mg/dL — ABNORMAL HIGH (ref 0.61–1.24)
GFR, Estimated: 55 mL/min — ABNORMAL LOW (ref >60)
Glucose, Bld: 104 mg/dL — ABNORMAL HIGH (ref 70–99)
Potassium: 4 mmol/L (ref 3.5–5.1)
Sodium: 125 mmol/L — ABNORMAL LOW (ref 135–145)
Total Bilirubin: 1 mg/dL (ref 0.3–1.2)
Total Protein: 7.3 g/dL (ref 6.5–8.1)

## 2021-03-22 LAB — TROPONIN I (HIGH SENSITIVITY)
Troponin I (High Sensitivity): 16 ng/L (ref <18)
Troponin I (High Sensitivity): 17 ng/L (ref <18)

## 2021-03-22 LAB — BASIC METABOLIC PANEL
Anion gap: 8 (ref 5–15)
BUN: 31 mg/dL — ABNORMAL HIGH (ref 8–23)
CO2: 23 mmol/L (ref 22–32)
Calcium: 8.6 mg/dL — ABNORMAL LOW (ref 8.9–10.3)
Chloride: 97 mmol/L — ABNORMAL LOW (ref 98–111)
Creatinine, Ser: 1.28 mg/dL — ABNORMAL HIGH (ref 0.61–1.24)
GFR, Estimated: 55 mL/min — ABNORMAL LOW (ref >60)
Glucose, Bld: 94 mg/dL (ref 70–99)
Potassium: 3.7 mmol/L (ref 3.5–5.1)
Sodium: 128 mmol/L — ABNORMAL LOW (ref 135–145)

## 2021-03-22 LAB — CBC
HCT: 33.9 % — ABNORMAL LOW (ref 39.0–52.0)
Hemoglobin: 11.2 g/dL — ABNORMAL LOW (ref 13.0–17.0)
MCH: 28.1 pg (ref 26.0–34.0)
MCHC: 33 g/dL (ref 30.0–36.0)
MCV: 85 fL (ref 80.0–100.0)
Platelets: 250 10*3/uL (ref 150–400)
RBC: 3.99 MIL/uL — ABNORMAL LOW (ref 4.22–5.81)
RDW: 13.4 % (ref 11.5–15.5)
WBC: 6.5 10*3/uL (ref 4.0–10.5)
nRBC: 0 % (ref 0.0–0.2)

## 2021-03-22 LAB — RESP PANEL BY RT-PCR (FLU A&B, COVID) ARPGX2
Influenza A by PCR: NEGATIVE
Influenza B by PCR: NEGATIVE
SARS Coronavirus 2 by RT PCR: NEGATIVE

## 2021-03-22 LAB — TSH: TSH: 1.813 u[IU]/mL (ref 0.350–4.500)

## 2021-03-22 LAB — BRAIN NATRIURETIC PEPTIDE: B Natriuretic Peptide: 1439 pg/mL — ABNORMAL HIGH (ref 0.0–100.0)

## 2021-03-22 MED ORDER — FUROSEMIDE 10 MG/ML IJ SOLN
40.0000 mg | Freq: Once | INTRAMUSCULAR | Status: AC
  Administered 2021-03-22: 40 mg via INTRAVENOUS
  Filled 2021-03-22: qty 4

## 2021-03-22 MED ORDER — RAMIPRIL 5 MG PO CAPS
5.0000 mg | ORAL_CAPSULE | Freq: Every day | ORAL | Status: DC
  Administered 2021-03-22: 5 mg via ORAL
  Filled 2021-03-22: qty 1

## 2021-03-22 MED ORDER — FUROSEMIDE 10 MG/ML IJ SOLN
40.0000 mg | Freq: Two times a day (BID) | INTRAMUSCULAR | Status: DC
  Administered 2021-03-22 – 2021-03-24 (×5): 40 mg via INTRAVENOUS
  Filled 2021-03-22 (×5): qty 4

## 2021-03-22 MED ORDER — METOPROLOL SUCCINATE ER 50 MG PO TB24
50.0000 mg | ORAL_TABLET | Freq: Every day | ORAL | Status: DC
  Administered 2021-03-22 – 2021-03-25 (×4): 50 mg via ORAL
  Filled 2021-03-22 (×4): qty 1

## 2021-03-22 MED ORDER — TRAZODONE HCL 50 MG PO TABS
25.0000 mg | ORAL_TABLET | Freq: Every evening | ORAL | Status: DC | PRN
Start: 2021-03-22 — End: 2021-03-25

## 2021-03-22 MED ORDER — RAMIPRIL 10 MG PO CAPS
10.0000 mg | ORAL_CAPSULE | Freq: Every day | ORAL | Status: DC
  Administered 2021-03-23 – 2021-03-25 (×3): 10 mg via ORAL
  Filled 2021-03-22 (×3): qty 1

## 2021-03-22 MED ORDER — HYDRALAZINE HCL 25 MG PO TABS
25.0000 mg | ORAL_TABLET | Freq: Three times a day (TID) | ORAL | Status: DC
  Administered 2021-03-22 – 2021-03-25 (×9): 25 mg via ORAL
  Filled 2021-03-22 (×9): qty 1

## 2021-03-22 MED ORDER — PANTOPRAZOLE SODIUM 40 MG PO TBEC
40.0000 mg | DELAYED_RELEASE_TABLET | Freq: Every day | ORAL | Status: DC
  Administered 2021-03-22 – 2021-03-25 (×4): 40 mg via ORAL
  Filled 2021-03-22 (×4): qty 1

## 2021-03-22 MED ORDER — ALBUTEROL SULFATE HFA 108 (90 BASE) MCG/ACT IN AERS: 2.0000 | INHALATION_SPRAY | Freq: Four times a day (QID) | RESPIRATORY_TRACT | Status: DC | PRN

## 2021-03-22 MED ORDER — ACETAMINOPHEN 325 MG PO TABS
650.0000 mg | ORAL_TABLET | Freq: Four times a day (QID) | ORAL | Status: DC | PRN
  Administered 2021-03-25: 650 mg via ORAL
  Filled 2021-03-22 (×2): qty 2

## 2021-03-22 MED ORDER — MAGNESIUM HYDROXIDE 400 MG/5ML PO SUSP: 30.0000 mL | Freq: Every day | ORAL | Status: DC | PRN

## 2021-03-22 MED ORDER — ASPIRIN EC 81 MG PO TBEC
81.0000 mg | DELAYED_RELEASE_TABLET | Freq: Every day | ORAL | Status: DC
  Administered 2021-03-22 – 2021-03-25 (×4): 81 mg via ORAL
  Filled 2021-03-22 (×4): qty 1

## 2021-03-22 MED ORDER — ACETAMINOPHEN 650 MG RE SUPP: 650.0000 mg | Freq: Four times a day (QID) | RECTAL | Status: DC | PRN

## 2021-03-22 MED ORDER — ENOXAPARIN SODIUM 40 MG/0.4ML IJ SOSY
40.0000 mg | PREFILLED_SYRINGE | INTRAMUSCULAR | Status: DC
  Administered 2021-03-22 – 2021-03-25 (×4): 40 mg via SUBCUTANEOUS
  Filled 2021-03-22 (×4): qty 0.4

## 2021-03-22 MED ORDER — ALBUTEROL SULFATE (2.5 MG/3ML) 0.083% IN NEBU: 2.5000 mg | INHALATION_SOLUTION | Freq: Four times a day (QID) | RESPIRATORY_TRACT | Status: DC | PRN

## 2021-03-22 MED ORDER — ONDANSETRON HCL 4 MG PO TABS: 4.0000 mg | ORAL_TABLET | Freq: Four times a day (QID) | ORAL | Status: DC | PRN

## 2021-03-22 MED ORDER — CLOPIDOGREL BISULFATE 75 MG PO TABS
75.0000 mg | ORAL_TABLET | Freq: Every day | ORAL | Status: DC
  Administered 2021-03-22 – 2021-03-25 (×4): 75 mg via ORAL
  Filled 2021-03-22 (×4): qty 1

## 2021-03-22 MED ORDER — ONDANSETRON HCL 4 MG/2ML IJ SOLN: 4.0000 mg | Freq: Four times a day (QID) | INTRAMUSCULAR | Status: DC | PRN

## 2021-03-22 MED ORDER — ATORVASTATIN CALCIUM 20 MG PO TABS
40.0000 mg | ORAL_TABLET | Freq: Every day | ORAL | Status: DC
  Administered 2021-03-22 – 2021-03-25 (×4): 40 mg via ORAL
  Filled 2021-03-22 (×4): qty 2

## 2021-03-22 NOTE — ED Notes (Signed)
Patient c/o leg cramps. Refuses Tylenol. MD Roosevelt Locks made aware this AM through private message patient would like something for cough and leg cramps

## 2021-03-22 NOTE — ED Notes (Signed)
Pt's family updated on pt's plan of care.

## 2021-03-22 NOTE — H&P (Signed)
[place]   PATIENT NAME: Daniel Bolton    MR#:  [id number]  DATE OF BIRTH:  Jan 08, 1934  DATE OF ADMISSION:  03/21/2021  PRIMARY CARE PHYSICIAN: [name], MD   Patient is coming from: Home  REQUESTING/REFERRING PHYSICIAN: Naaman Plummer, MD  CHIEF COMPLAINT:   Chief Complaint  Patient presents with  . Hypertension  . Cough  All history was obtained via [place] translator as the patient is only Spanish-speaking.  HISTORY OF PRESENT ILLNESS:  Daniel Bolton is a 87 y.o. Hispanic male with medical history significant for hypertension, CHF with previous EF of 50 to 55%, valvular regurgitations and CVA, who presented to the emergency room with acute onset of worsening dyspnea and dry cough for the last week with associated generalized weakness.  He has been compliant with his Lasix but has not been watching his fluid intake.  He admitted to orthopnea and paroxysmal nocturnal dyspnea as well as dyspnea on exertion.  He denied any worsening lower extremity edema.  He has been having mild chest pain only with cough.  He denies any wheezing.  No dysuria or oliguria or flank pain however he has been having hesitancy with his BPH.  No fever or chills.  No headache or dizziness or blurred vision.  He has residual right hand weakness from previous stroke.  No new focal muscle weakness.  ED Course: When he came to the ER, blood pressure was 184/75 with respiratory rate of 22.  Labs revealed a BUN of 31 and creatinine 1.28 but better than previous levels 7 days ago, sodium of 125 compared to 135 then and albumin 3.4.  BNP was significantly elevated at 1439 and high-sensitivity troponin I was 16.  CBC showed anemia with hemoglobin of 11 hematocrit 33.7 close to his baseline.  Influenza antigens and COVID-19 PCR came back negative.  EKG as reviewed by me : Sinus rhythm with a rate of 62 and suspected LVH with T wave inversion laterally. Imaging: Two-view chest x-ray  showed cardiomegaly with bilateral pulmonary infiltrates and small effusions.  The patient was given 40 mg of IV Lasix.  He will be admitted to a progressive unit bed for further evaluation and management. PAST MEDICAL HISTORY:   Past Medical History:  Diagnosis Date  . Hypertension   . Stroke [facility])   History of systolic CHF with last EF improving to 50 to 55% in April/22.  PAST SURGICAL HISTORY:   Past Surgical History:  Procedure Laterality Date  . BRAIN SURGERY    . CARDIAC SURGERY      SOCIAL HISTORY:   Social History   Tobacco Use  . Smoking status: Never  . Smokeless tobacco: Never  Substance Use Topics  . Alcohol use: No    FAMILY HISTORY:  History reviewed. No pertinent family history.  He denies any familial diseases.  DRUG ALLERGIES:  No Known Allergies  REVIEW OF SYSTEMS:   ROS As per history of present illness. All pertinent systems were reviewed above. Constitutional, HEENT, cardiovascular, respiratory, GI, GU, musculoskeletal, neuro, psychiatric, endocrine, integumentary and hematologic systems were reviewed and are otherwise negative/unremarkable except for positive findings mentioned above in the HPI.   MEDICATIONS AT HOME:   Prior to Admission medications   Medication Sig Start Date End Date Taking? Authorizing Provider  albuterol (VENTOLIN HFA) 108 (90 Base) MCG/ACT inhaler Inhale 2 puffs into the lungs every 6 (six) hours as needed for wheezing or shortness of breath.  [provider]  aspirin EC 81 MG EC tablet Take 1 tablet (81 mg total) by mouth daily. Swallow whole. 03/14/21   Danford, Suann Larry, MD  clopidogrel (PLAVIX) 75 MG tablet Take 75 mg by mouth daily.    [provider]  furosemide (LASIX) 20 MG tablet Take 1 tablet (20 mg total) by mouth daily. 03/14/21   Danford, Suann Larry, MD  hydrALAZINE (APRESOLINE) 25 MG tablet Take 1 tablet (25 mg total) by mouth every 8 (eight) hours. 03/14/21   Danford, Suann Larry, MD   metoprolol succinate (TOPROL-XL) 50 MG 24 hr tablet Take 50 mg by mouth daily. Take with or immediately following a meal.    [provider]  ramipril (ALTACE) 2.5 MG capsule Take 2.5 mg by mouth daily.    [provider]  simvastatin (ZOCOR) 80 MG tablet Take 80 mg by mouth daily.    [provider]      VITAL SIGNS:  Blood pressure (!) 184/75, pulse 62, temperature 98 F (36.7 C), temperature source Oral, resp. rate (!) 22, height '5\' 9"'$  (1.753 m), weight 55 kg, SpO2 98 %.  PHYSICAL EXAMINATION:  Physical Exam  GENERAL:  87 y.o.-year-old Hispanic male patient lying in the bed with mild respiratory distress with conversational dyspnea. EYES: Pupils equal, round, reactive to light and accommodation. No scleral icterus. Extraocular muscles intact.  HEENT: Head atraumatic, normocephalic. Oropharynx and nasopharynx clear.  NECK:  Supple, no jugular venous distention. No thyroid enlargement, no tenderness.  LUNGS: Diminished bibasilar breath sounds with mild bibasilar rales. CARDIOVASCULAR: Regular rate and rhythm, S1, S2 normal. No murmurs, rubs, or gallops.  ABDOMEN: Soft, nondistended, nontender. Bowel sounds present. No organomegaly or mass.  EXTREMITIES: No pedal edema, cyanosis, or clubbing.  NEUROLOGIC: Cranial nerves II through XII are intact. Muscle strength 5/5 in all extremities. Sensation intact. Gait not checked.  PSYCHIATRIC: The patient is alert and oriented x 3.  Normal affect and good eye contact. SKIN: No obvious rash, lesion, or ulcer.   LABORATORY PANEL:   CBC Recent Labs  Lab 03/21/21 2350  WBC 7.4  HGB 11.0*  HCT 33.7*  PLT 244   ------------------------------------------------------------------------------------------------------------------  Chemistries  Recent Labs  Lab 03/21/21 2350  NA 125*  K 4.0  CL 96*  CO2 22  GLUCOSE 104*  BUN 31*  CREATININE 1.28*  CALCIUM 8.3*  AST 32  ALT 30  ALKPHOS 110  BILITOT 1.0    ------------------------------------------------------------------------------------------------------------------  Cardiac Enzymes No results for input(s): TROPONINI in the last 168 hours. ------------------------------------------------------------------------------------------------------------------  RADIOLOGY:  DG Chest 2 View  Result Date: 03/22/2021 CLINICAL DATA:  Shortness of breath and cough for 1 month. EXAM: CHEST - 2 VIEW COMPARISON:  03/10/2021 FINDINGS: Postoperative changes in the mediastinum. Cardiac enlargement with diffuse bilateral airspace and interstitial infiltrates, likely edema. Small bilateral pleural effusions. Similar appearance to previous study. Calcification of the aorta. No pneumothorax. IMPRESSION: Cardiac enlargement with bilateral pulmonary infiltrates and small effusions. Electronically Signed   By: Lucienne Capers M.D.   On: 03/22/2021 00:18      IMPRESSION AND PLAN:   1.  Acute on chronic CHF with history of systolic CHF.  Most recent EF was 50 to 55% in April/2022. - The patient will be admitted to a progressive unit bed. - We will continue diuresis with IV Lasix. - We will follow serial troponin I's. - We will obtain a cardiology consultation in a.m. - I notified Dr. Clayborn Bigness with about the patient. - We will continue  beta-blocker therapy as well as ACE inhibitor therapy with ramipril and vasodilator therapy with hydralazine.  2.  Essential hypertension. - We will continue Toprol-XL and ramipril as well as hydralazine.  3.  Dyslipidemia. - We will continue statin therapy.  4.  Coronary artery disease status post CABG. - We will continue aspirin and Plavix.  5.  GERD. - We will resume PPI therapy.  DVT prophylaxis: Lovenox. Code Status: full code. Family Communication:  The plan of care was discussed in details with the patient (and his daughter who was with him in the room). I answered all questions. The patient agreed to proceed with  the above mentioned plan. Further management will depend upon hospital course. Disposition Plan: Back to previous home environment Consults called: Cardiology All the records are reviewed and case discussed with ED provider.  Status is: Inpatient  Remains inpatient appropriate because:Ongoing diagnostic testing needed not appropriate for outpatient work up, Unsafe d/c plan, IV treatments appropriate due to intensity of illness or inability to take PO, and Inpatient level of care appropriate due to severity of illness  Dispo: The patient is from: Home              Anticipated d/c is to: Home              Patient currently is not medically stable to d/c.   Difficult to place patient No   TOTAL TIME TAKING CARE OF THIS PATIENT: 55 minutes.    Christel Mormon M.D on 03/22/2021 at 1:31 AM  [facility]   From 7 PM-7 AM, contact night-coverage [email or web address]  CC: Primary care physician; [name], MD

## 2021-03-22 NOTE — Consult Note (Signed)
CARDIOLOGY CONSULT NOTE               Patient ID: Daniel Bolton MRN: [id number] DOB/AGE: 12/29/1933 87 y.o.  Admit date: 03/21/2021 Referring Physician Dr. Eugenie Norrie hospitalist Primary Physician [name] primary Primary Cardiologist unknown Reason for Consultation hypertensive urgency coronary artery disease congestive heart failure  HPI: Patient is a 87 year old Hispanic male Spanish-speaking presents with hypertension congestive heart failure 87 year old Hispanic male with reasonably preserved left ventricular function 50 to 55% with some valve regurgitation CVA some residual hemiparesis who presented with worsening dyspnea and dry cough as well as hypertensive urgency did not complain much in the way of chest pain or angina he was at rest not very active states to been compliant with his Lasix and his fluid intake before he presented with worsening dyspnea symptoms.  In the emergency room his blood pressure was [id number] systolic and his BNP was [id number] EKG was nondiagnostic so patient was admitted for heart failure symptoms 87 year old EKG also showed bilateral pulmonary infiltrates and small effusions.  Cardiology was then consulted to help with heart failure management patient was given 40 of IV Lasix in the emergency room which seemed to help her symptoms  Review of systems complete and found to be negative unless listed above     Past Medical History:  Diagnosis Date   Hypertension    Stroke [facility])     Past Surgical History:  Procedure Laterality Date   [place]      (Not in a hospital admission)  Social History   Socioeconomic History   Marital status: Married    Spouse name: Not on file   Number of children: Not on file   Years of education: Not on file   Highest education level: Not on file  Occupational History   Not on file  Tobacco Use   Smoking status: Never   Smokeless tobacco: Never  Substance and Sexual Activity   Alcohol use: No   Drug use: No   Sexual  activity: Not on file  Other Topics Concern   Not on file  Social History Narrative   Not on file   Social Determinants of Health   Financial Resource Strain: Not on file  Food Insecurity: Not on file  Transportation Needs: Not on file  Physical Activity: Not on file  Stress: Not on file  Social Connections: Not on file  Intimate Partner Violence: Not on file    History reviewed. No pertinent family history.    Review of systems complete and found to be negative unless listed above      PHYSICAL EXAM  General: Well developed, well nourished, in no acute distress HEENT:  Normocephalic and atramatic Neck:  No JVD.  Lungs: Clear bilaterally to auscultation and percussion. Heart: HRRR . Normal S1 and S2 without gallops or murmurs.  Abdomen: Bowel sounds are positive, abdomen soft and non-tender  Msk:  Back normal, normal gait. Normal strength and tone for age. Extremities: No clubbing, cyanosis or edema.   Neuro: Alert and oriented X 3. Psych:  Good affect, responds appropriately  Labs:   Lab Results  Component Value Date   WBC 6.5 03/22/2021   HGB 11.2 (L) 03/22/2021   HCT 33.9 (L) 03/22/2021   MCV 85.0 03/22/2021   PLT 250 03/22/2021    Recent Labs  Lab 03/21/21 2350 03/22/21 0321  NA 125* 128*  K 4.0 3.7  CL 96* 97*  CO2 22 23  BUN 31*  31*  CREATININE 1.28* 1.28*  CALCIUM 8.3* 8.6*  PROT 7.3  --   BILITOT 1.0  --   ALKPHOS 110  --   ALT 30  --   AST 32  --   GLUCOSE 104* 94   Lab Results  Component Value Date   CKTOTAL 140 12/11/2020   TROPONINI 0.03 (HH) 12/29/2016   No results found for: CHOL No results found for: HDL No results found for: LDLCALC No results found for: TRIG No results found for: CHOLHDL No results found for: LDLDIRECT    Radiology: DG Chest 2 View  Result Date: 03/22/2021 CLINICAL DATA:  Shortness of breath and cough for 1 month. EXAM: CHEST - 2 VIEW COMPARISON:  03/10/2021 FINDINGS: Postoperative changes in the  mediastinum. Cardiac enlargement with diffuse bilateral airspace and interstitial infiltrates, likely edema. Small bilateral pleural effusions. Similar appearance to previous study. Calcification of the aorta. No pneumothorax. IMPRESSION: Cardiac enlargement with bilateral pulmonary infiltrates and small effusions. Electronically Signed   By: Lucienne Capers M.D.   On: 03/22/2021 00:18   DG Chest Portable 1 View  Result Date: 03/11/2021 CLINICAL DATA:  Cough and shortness of breath. EXAM: PORTABLE CHEST 1 VIEW COMPARISON:  December 28, 2016 FINDINGS: Multiple sternal wires are seen. Moderate to marked severity areas of atelectasis and/or infiltrate are seen within the bilateral lung bases and mid left lung. A small right pleural effusion is present. No pneumothorax is identified. The cardiac silhouette is mildly enlarged. Moderate severity calcification of the aortic arch is seen. Degenerative changes seen throughout the thoracic spine. IMPRESSION: 1. Cardiomegaly with moderate to marked severity areas of bilateral atelectasis and/or infiltrate. 2. Small right pleural effusion. Electronically Signed   By: Virgina Norfolk M.D.   On: 03/11/2021 00:03    EKG: Sinus rhythm evidence of LVH nonspecific ST-T wave changes rate of 70  ASSESSMENT AND PLAN:  Hypertension Cough Congestive heart failure Multivessel coronary disease Coronary artery bypass surgery Hemiparesis Renal insufficiency Abnormal EKG GERD . Plan Agree with admit to telemetry Recommend supplemental oxygen as necessary Proceed with IV diuresis with Lasix Continue ACE or ARB with beta-blocker therapy for heart failure Pursue stricter blood pressure control Continue aspirin Plavix for coronary artery disease Recommend Protonix therapy for reflux type symptoms Maintain statin therapy for coronary bypass surgery secondary prevention No invasive procedures planned at this point  Signed: Yolonda Kida MD, 03/22/2021, 9:07  AM

## 2021-03-22 NOTE — ED Notes (Signed)
Patient cannot sit still in bed due to severe leg cramps. MD Marko Stai for medicine to relieve pain

## 2021-03-22 NOTE — ED Notes (Signed)
Informed RN bed assigned 

## 2021-03-22 NOTE — Progress Notes (Signed)
PROGRESS NOTE    Daniel Bolton  [id number] DOB: [birth year] DOA: 03/21/2021 PCP: [name], MD   Chief complaint.  Shortness of breath. Brief Narrative:  Daniel Bolton is a 87 y.o. Hispanic male with medical history significant for hypertension, CHF with previous EF of 50 to 55%, valvular regurgitations and CVA, who presented to the emergency room with acute onset of worsening dyspnea and dry cough for the last week with associated generalized weakness. Chest x-ray showed bilateral lower lobe infiltrates with pleural effusion.  BNP profoundly elevated, he also has significant hyponatremia. He was started on IV Lasix for acute exacerbation of congestive heart failure.  Per record, his most recent ejection fraction was between 50 and 55%.  Assessment & Plan:   Active Problems:   * No active hospital problems. *  #1. Acute on chronic diastolic congestive heart failure. Patient had a history of systolic congestive heart failure, however, recent echocardiogram showed normal ejection fraction.  Repeat echocardiogram is pending. Cardiology consult is obtained. I will continue IV Lasix for now. Patient short of breath is improving today, he may be able to discharge tomorrow.  2.  Essential hypertension. Continue metoprolol, increase dose of ramipril l as patient blood pressure has been running high.  #3.  Hyponatremia with hypervolemia. Continue IV Lasix, follow BMP.  3.  Coronary disease  No recurrence of angina.     DVT prophylaxis: Lovenox  Code Status: full Family Communication:  Disposition Plan:    Status is: Inpatient  Remains inpatient appropriate because:Inpatient level of care appropriate due to severity of illness  Dispo: The patient is from: Home              Anticipated d/c is to: Home              Patient currently is not medically stable to d/c.   Difficult to place patient No        I/O last 3 completed shifts: In: -  Out: 1300  [Urine:1300] Total I/O In: -  Out: 2435 [Urine:2435]     Consultants:  card  Procedures: none  Antimicrobials: None   Subjective: Patient was assisted from virtual interpreter. Patient still complaining short of breath with exertion, cough, nonproductive. He has some leg cramping since arriving the hospital. No abdominal pain or nausea vomiting. No fever chills pain No dysuria hematuria.  Objective: Vitals:   03/22/21 1030 03/22/21 1100 03/22/21 1130 03/22/21 1330  BP: (!) 172/67 (!) 163/75 (!) 182/73 (!) 187/76  Pulse: (!) 57 (!) 58 (!) 56 (!) 59  Resp: (!) 23 (!) 24 18 (!) 24  Temp:      TempSrc:      SpO2: 96% 94% 91% 96%  Weight:      Height:        Intake/Output Summary (Last 24 hours) at 03/22/2021 1435 Last data filed at 03/22/2021 1350 Gross per 24 hour  Intake --  Output 3735 ml  Net -3735 ml   Filed Weights   03/21/21 2351  Weight: 55 kg    Examination:  General exam: Appears calm and comfortable  Respiratory system: Decreased breathing sounds. Respiratory effort normal. Cardiovascular system: S1 & S2 heard, RRR. No JVD, murmurs, rubs, gallops or clicks. No pedal edema. Gastrointestinal system: Abdomen is nondistended, soft and nontender. No organomegaly or masses felt. Normal bowel sounds heard. Central nervous system: Alert and oriented. No focal neurological deficits. Extremities: Symmetric 5 x 5 power. Skin: No rashes, lesions or ulcers Psychiatry: Judgement  and insight appear normal. Mood & affect appropriate.     Data Reviewed: I have personally reviewed following labs and imaging studies  CBC: Recent Labs  Lab 03/21/21 2350 03/22/21 0321  WBC 7.4 6.5  NEUTROABS 5.1  --   HGB 11.0* 11.2*  HCT 33.7* 33.9*  MCV 85.5 85.0  PLT 244 [id number]   Basic Metabolic Panel: Recent Labs  Lab 03/21/21 2350 03/22/21 0321  NA 125* 128*  K 4.0 3.7  CL 96* 97*  CO2 22 23  GLUCOSE 104* 94  BUN 31* 31*  CREATININE 1.28* 1.28*  CALCIUM 8.3*  8.6*   GFR: Estimated Creatinine Clearance: 32.8 mL/min (A) (by C-G formula based on SCr of 1.28 mg/dL (H)). Liver Function Tests: Recent Labs  Lab 03/21/21 2350  AST 32  ALT 30  ALKPHOS 110  BILITOT 1.0  PROT 7.3  ALBUMIN 3.4*   No results for input(s): LIPASE, AMYLASE in the last 168 hours. No results for input(s): AMMONIA in the last 168 hours. Coagulation Profile: No results for input(s): INR, PROTIME in the last 168 hours. Cardiac Enzymes: No results for input(s): CKTOTAL, CKMB, CKMBINDEX, TROPONINI in the last 168 hours. BNP (last 3 results) No results for input(s): PROBNP in the last 8760 hours. HbA1C: No results for input(s): HGBA1C in the last 72 hours. CBG: No results for input(s): GLUCAP in the last 168 hours. Lipid Profile: No results for input(s): CHOL, HDL, LDLCALC, TRIG, CHOLHDL, LDLDIRECT in the last 72 hours. Thyroid Function Tests: Recent Labs    03/22/21 0213  TSH 1.813   Anemia Panel: No results for input(s): VITAMINB12, FOLATE, FERRITIN, TIBC, IRON, RETICCTPCT in the last 72 hours. Sepsis Labs: No results for input(s): PROCALCITON, LATICACIDVEN in the last 168 hours.  Recent Results (from the past 240 hour(s))  Resp Panel by RT-PCR (Flu A&B, Covid) Nasopharyngeal Swab     Status: None   Collection Time: 03/21/21 11:56 PM   Specimen: Nasopharyngeal Swab; Nasopharyngeal(NP) swabs in vial transport medium  Result Value Ref Range Status   SARS Coronavirus 2 by RT PCR NEGATIVE NEGATIVE Final    Comment: (NOTE) SARS-CoV-2 target nucleic acids are NOT DETECTED.  The SARS-CoV-2 RNA is generally detectable in upper respiratory specimens during the acute phase of infection. The lowest concentration of SARS-CoV-2 viral copies this assay can detect is 138 copies/mL. A negative result does not preclude SARS-Cov-2 infection and should not be used as the sole basis for treatment or other patient management decisions. A negative result may occur with   improper specimen collection/handling, submission of specimen other than nasopharyngeal swab, presence of viral mutation(s) within the areas targeted by this assay, and inadequate number of viral copies(<138 copies/mL). A negative result must be combined with clinical observations, patient history, and epidemiological information. The expected result is Negative.  Fact Sheet for Patients:  EntrepreneurPulse.com.au  Fact Sheet for Healthcare Providers:  IncredibleEmployment.be  This test is no t yet approved or cleared by the [place] FDA and  has been authorized for detection and/or diagnosis of SARS-CoV-2 by FDA under an Emergency Use Authorization (EUA). This EUA will remain  in effect (meaning this test can be used) for the duration of the COVID-19 declaration under Section 564(b)(1) of the Act, 21 U.S.C.section 360bbb-3(b)(1), unless the authorization is terminated  or revoked sooner.       Influenza A by PCR NEGATIVE NEGATIVE Final   Influenza B by PCR NEGATIVE NEGATIVE Final    Comment: (NOTE) The Xpert Xpress SARS-CoV-2/FLU/RSV plus  assay is intended as an aid in the diagnosis of influenza from Nasopharyngeal swab specimens and should not be used as a sole basis for treatment. Nasal washings and aspirates are unacceptable for Xpert Xpress SARS-CoV-2/FLU/RSV testing.  Fact Sheet for Patients: EntrepreneurPulse.com.au  Fact Sheet for Healthcare Providers: IncredibleEmployment.be  This test is not yet approved or cleared by the [place] FDA and has been authorized for detection and/or diagnosis of SARS-CoV-2 by FDA under an Emergency Use Authorization (EUA). This EUA will remain in effect (meaning this test can be used) for the duration of the COVID-19 declaration under Section 564(b)(1) of the Act, 21 U.S.C. section 360bbb-3(b)(1), unless the authorization is terminated  or revoked.  Performed at [facility], [address], [place], [place] [address]          Radiology Studies: DG Chest 2 View  Result Date: 03/22/2021 CLINICAL DATA:  Shortness of breath and cough for 1 month. EXAM: CHEST - 2 VIEW COMPARISON:  03/10/2021 FINDINGS: Postoperative changes in the mediastinum. Cardiac enlargement with diffuse bilateral airspace and interstitial infiltrates, likely edema. Small bilateral pleural effusions. Similar appearance to previous study. Calcification of the aorta. No pneumothorax. IMPRESSION: Cardiac enlargement with bilateral pulmonary infiltrates and small effusions. Electronically Signed   By: Lucienne Capers M.D.   On: 03/22/2021 00:18        Scheduled Meds:  aspirin EC  81 mg Oral Daily   atorvastatin  40 mg Oral Daily   clopidogrel  75 mg Oral Daily   enoxaparin (LOVENOX) injection  40 mg Subcutaneous Q24H   furosemide  40 mg Intravenous Q12H   hydrALAZINE  25 mg Oral Q8H   metoprolol succinate  50 mg Oral Q breakfast   pantoprazole  40 mg Oral Daily   [START ON 03/23/2021] ramipril  10 mg Oral Daily   Continuous Infusions:   LOS: 0 days    Time spent: no charge    Sharen Hones, MD [facility]   To contact the attending provider between 7A-7P or the covering provider during after hours 7P-7A, please log into the web site [email or web address] and access using universal [place] password for that web site. If you do not have the password, please call the hospital operator.  03/22/2021, 2:35 PM

## 2021-03-23 DIAGNOSIS — I130 Hypertensive heart and chronic kidney disease with heart failure and stage 1 through stage 4 chronic kidney disease, or unspecified chronic kidney disease: Secondary | ICD-10-CM | POA: Diagnosis not present

## 2021-03-23 DIAGNOSIS — I5043 Acute on chronic combined systolic (congestive) and diastolic (congestive) heart failure: Secondary | ICD-10-CM | POA: Diagnosis not present

## 2021-03-23 DIAGNOSIS — I10 Essential (primary) hypertension: Secondary | ICD-10-CM

## 2021-03-23 DIAGNOSIS — I5033 Acute on chronic diastolic (congestive) heart failure: Secondary | ICD-10-CM

## 2021-03-23 DIAGNOSIS — N184 Chronic kidney disease, stage 4 (severe): Secondary | ICD-10-CM

## 2021-03-23 LAB — BASIC METABOLIC PANEL
Anion gap: 7 (ref 5–15)
BUN: 30 mg/dL — ABNORMAL HIGH (ref 8–23)
CO2: 27 mmol/L (ref 22–32)
Calcium: 8.7 mg/dL — ABNORMAL LOW (ref 8.9–10.3)
Chloride: 98 mmol/L (ref 98–111)
Creatinine, Ser: 1.27 mg/dL — ABNORMAL HIGH (ref 0.61–1.24)
GFR, Estimated: 55 mL/min — ABNORMAL LOW (ref >60)
Glucose, Bld: 84 mg/dL (ref 70–99)
Potassium: 3.2 mmol/L — ABNORMAL LOW (ref 3.5–5.1)
Sodium: 132 mmol/L — ABNORMAL LOW (ref 135–145)

## 2021-03-23 LAB — MAGNESIUM: Magnesium: 1.9 mg/dL (ref 1.7–2.4)

## 2021-03-23 MED ORDER — POTASSIUM CHLORIDE 10 MEQ/100ML IV SOLN
10.0000 meq | INTRAVENOUS | Status: AC
  Administered 2021-03-23 (×3): 10 meq via INTRAVENOUS
  Filled 2021-03-23 (×3): qty 100

## 2021-03-23 MED ORDER — GUAIFENESIN-DM 100-10 MG/5ML PO SYRP
5.0000 mL | ORAL_SOLUTION | ORAL | Status: DC | PRN
  Administered 2021-03-23 – 2021-03-24 (×2): 5 mL via ORAL
  Filled 2021-03-23 (×2): qty 5

## 2021-03-23 MED ORDER — POTASSIUM CHLORIDE CRYS ER 20 MEQ PO TBCR
40.0000 meq | EXTENDED_RELEASE_TABLET | Freq: Once | ORAL | Status: AC
  Administered 2021-03-23: 40 meq via ORAL
  Filled 2021-03-23: qty 2

## 2021-03-23 NOTE — Progress Notes (Signed)
Cardiology progress note [facility] Cardiology    SUBJECTIVE: Denies any chest pain mildly short of breath no fever chills or sweats no nausea vomiting or diarrhea   Vitals:   03/23/21 0504 03/23/21 0734 03/23/21 1142 03/23/21 1623  BP:  (!) 153/60 (!) 165/81 (!) 164/72  Pulse:  (!) 57 (!) 53 (!) 56  Resp:  '17 18 17  '$ Temp:  97.9 F (36.6 C) 97.7 F (36.5 C) 97.6 F (36.4 C)  TempSrc:  Oral Oral   SpO2:  100% 100% 98%  Weight: 55.2 kg     Height:         Intake/Output Summary (Last 24 hours) at 03/23/2021 2332 Last data filed at 03/23/2021 2118 Gross per 24 hour  Intake 1326.59 ml  Output 3965 ml  Net -2638.41 ml      PHYSICAL EXAM  General: Well developed, well nourished, in no acute distress HEENT:  Normocephalic and atramatic Neck:  No JVD.  Lungs: Clear bilaterally to auscultation and percussion. Heart: Bradycardia. Normal S1 and S2 without gallops or murmurs.  Abdomen: Bowel sounds are positive, abdomen soft and non-tender  Msk:  Back normal, normal gait. Normal strength and tone for age. Extremities: No clubbing, cyanosis or edema.   Neuro: Alert and oriented X 3. Psych:  Good affect, responds appropriately   LABS: Basic Metabolic Panel: Recent Labs    03/22/21 0321 03/23/21 0445  NA 128* 132*  K 3.7 3.2*  CL 97* 98  CO2 23 27  GLUCOSE 94 84  BUN 31* 30*  CREATININE 1.28* 1.27*  CALCIUM 8.6* 8.7*  MG  --  1.9   Liver Function Tests: Recent Labs    03/21/21 2350  AST 32  ALT 30  ALKPHOS 110  BILITOT 1.0  PROT 7.3  ALBUMIN 3.4*   No results for input(s): LIPASE, AMYLASE in the last 72 hours. CBC: Recent Labs    03/21/21 2350 03/22/21 0321  WBC 7.4 6.5  NEUTROABS 5.1  --   HGB 11.0* 11.2*  HCT 33.7* 33.9*  MCV 85.5 85.0  PLT 244 250   Cardiac Enzymes: No results for input(s): CKTOTAL, CKMB, CKMBINDEX, TROPONINI in the last 72 hours. BNP: Invalid input(s): POCBNP D-Dimer: No results for input(s): DDIMER in the last 72  hours. Hemoglobin A1C: No results for input(s): HGBA1C in the last 72 hours. Fasting Lipid Panel: No results for input(s): CHOL, HDL, LDLCALC, TRIG, CHOLHDL, LDLDIRECT in the last 72 hours. Thyroid Function Tests: Recent Labs    03/22/21 0213  TSH 1.813   Anemia Panel: No results for input(s): VITAMINB12, FOLATE, FERRITIN, TIBC, IRON, RETICCTPCT in the last 72 hours.  DG Chest 2 View  Result Date: 03/22/2021 CLINICAL DATA:  Shortness of breath and cough for 1 month. EXAM: CHEST - 2 VIEW COMPARISON:  03/10/2021 FINDINGS: Postoperative changes in the mediastinum. Cardiac enlargement with diffuse bilateral airspace and interstitial infiltrates, likely edema. Small bilateral pleural effusions. Similar appearance to previous study. Calcification of the aorta. No pneumothorax. IMPRESSION: Cardiac enlargement with bilateral pulmonary infiltrates and small effusions. Electronically Signed   By: Lucienne Capers M.D.   On: 03/22/2021 00:18     Echo low normal left ventricular function at 50%  TELEMETRY: Normal sinus rhythm rate of 55:  ASSESSMENT AND PLAN:  Active Problems:   Moderate major depression, single episode (HCC)   CKD (chronic kidney disease), stage IV (HCC)   Essential hypertension   Acute on chronic diastolic CHF (congestive heart failure) (HCC)   Hyponatremia  1.  Diastolic congestive heart failure we will continue Lasix therapy supplemental oxygen as necessary Continue beta-blocker ACE inhibitor diuretic Have the patient follow-up with nephrology in the future Mild bradycardia probably related to beta-blockers no direct therapy indicated Continue to correct electrolytes hyponatremia hypokalemia Multivessel coronary disease no recent anginal type symptoms Hypertension management with metoprolol ramipril consider adding amlodipine if further blood pressure support necessary   Yolonda Kida, MD 03/23/2021 11:32 PM

## 2021-03-23 NOTE — Progress Notes (Signed)
PROGRESS NOTE    Daniel Bolton  [id number] DOB: 10/06/1934 DOA: 03/21/2021 PCP: [name], MD   Chief complaint.  Shortness of breath. Brief Narrative:   Daniel Bolton is a 87 y.o. [name] with medical history significant for hypertension, CHF with previous EF of 50 to 55%, valvular regurgitations and CVA, who presented to the emergency room with acute onset of worsening dyspnea and dry cough for the last week with associated generalized weakness. Chest x-ray showed bilateral lower lobe infiltrates with pleural effusion.  BNP profoundly elevated, he also has significant hyponatremia. He was started on IV Lasix for acute exacerbation of congestive heart failure.  Per record, his most recent ejection fraction was between 50 and 55%.  Assessment & Plan:   Active Problems:   Moderate major depression, single episode (HCC)   CKD (chronic kidney disease), stage IV (HCC)   Essential hypertension   Acute on chronic diastolic CHF (congestive heart failure) (HCC)   Hyponatremia  #1.  Acute on chronic diastolic congestive heart failure. Patient condition is gradually improving, renal function stable, I will continue IV Lasix for 24 hours, anticipating discharge home tomorrow.  2.  Essential hypertension. Continue metoprolol and ramipril.  3.  Hyponatremia with hypervolemia. Sodium level better, continue IV Lasix.  3.  Coronary artery disease. Stable.  #4.  Hypokalemia. Repleted.    DVT prophylaxis: Lovenox Code Status: full Family Communication:  Disposition Plan:    Status is: Inpatient  Remains inpatient appropriate because:Inpatient level of care appropriate due to severity of illness  Dispo: The patient is from: Home              Anticipated d/c is to: Home              Patient currently is not medically stable to d/c.   Difficult to place patient No        I/O last 3 completed shifts: In: -  Out: 5600 [Urine:5600] Total I/O In:  565.3 [P.O.:480; IV Piggyback:85.3] Out: 42 [Urine:925]     Consultants:  none  Procedures: none  Antimicrobials:None  Subjective: Patient slept well last night, still has cough, short of breath improving. Leg cramping is also better today. No chest pain or palpitation. No fever or chills. No dysuria hematuria.  Objective: Vitals:   03/23/21 0459 03/23/21 0504 03/23/21 0734 03/23/21 1142  BP: (!) 147/62  (!) 153/60 (!) 165/81  Pulse: (!) 56  (!) 57 (!) 53  Resp:   17 18  Temp:   97.9 F (36.6 C) 97.7 F (36.5 C)  TempSrc:   Oral   SpO2: 97%  100% 100%  Weight:  55.2 kg    Height:        Intake/Output Summary (Last 24 hours) at 03/23/2021 1223 Last data filed at 03/23/2021 1140 Gross per 24 hour  Intake 565.33 ml  Output 3200 ml  Net -2634.67 ml   Filed Weights   03/21/21 2351 03/22/21 1755 03/23/21 0504  Weight: 55 kg 55.7 kg 55.2 kg    Examination:  General exam: Appears calm and comfortable  Respiratory system: A few crackles in the base. Respiratory effort normal. Cardiovascular system: S1 & S2 heard, RRR. No JVD, murmurs, rubs, gallops or clicks. No pedal edema. Gastrointestinal system: Abdomen is nondistended, soft and nontender. No organomegaly or masses felt. Normal bowel sounds heard. Central nervous system: Alert and oriented. No focal neurological deficits. Extremities: Symmetric 5 x 5 power. Skin: No rashes, lesions or ulcers Psychiatry: Judgement and  insight appear normal. Mood & affect appropriate.     Data Reviewed: I have personally reviewed following labs and imaging studies  CBC: Recent Labs  Lab 03/21/21 2350 03/22/21 0321  WBC 7.4 6.5  NEUTROABS 5.1  --   HGB 11.0* 11.2*  HCT 33.7* 33.9*  MCV 85.5 85.0  PLT 244 [id number]   Basic Metabolic Panel: Recent Labs  Lab 03/21/21 2350 03/22/21 0321 03/23/21 0445  NA 125* 128* 132*  K 4.0 3.7 3.2*  CL 96* 97* 98  CO2 '22 23 27  '$ GLUCOSE 104* 94 84  BUN 31* 31* 30*  CREATININE 1.28*  1.28* 1.27*  CALCIUM 8.3* 8.6* 8.7*  MG  --   --  1.9   GFR: Estimated Creatinine Clearance: 33.2 mL/min (A) (by C-G formula based on SCr of 1.27 mg/dL (H)). Liver Function Tests: Recent Labs  Lab 03/21/21 2350  AST 32  ALT 30  ALKPHOS 110  BILITOT 1.0  PROT 7.3  ALBUMIN 3.4*   No results for input(s): LIPASE, AMYLASE in the last 168 hours. No results for input(s): AMMONIA in the last 168 hours. Coagulation Profile: No results for input(s): INR, PROTIME in the last 168 hours. Cardiac Enzymes: No results for input(s): CKTOTAL, CKMB, CKMBINDEX, TROPONINI in the last 168 hours. BNP (last 3 results) No results for input(s): PROBNP in the last 8760 hours. HbA1C: No results for input(s): HGBA1C in the last 72 hours. CBG: No results for input(s): GLUCAP in the last 168 hours. Lipid Profile: No results for input(s): CHOL, HDL, LDLCALC, TRIG, CHOLHDL, LDLDIRECT in the last 72 hours. Thyroid Function Tests: Recent Labs    03/22/21 0213  TSH 1.813   Anemia Panel: No results for input(s): VITAMINB12, FOLATE, FERRITIN, TIBC, IRON, RETICCTPCT in the last 72 hours. Sepsis Labs: No results for input(s): PROCALCITON, LATICACIDVEN in the last 168 hours.  Recent Results (from the past 240 hour(s))  Resp Panel by RT-PCR (Flu A&B, Covid) Nasopharyngeal Swab     Status: None   Collection Time: 03/21/21 11:56 PM   Specimen: Nasopharyngeal Swab; Nasopharyngeal(NP) swabs in vial transport medium  Result Value Ref Range Status   SARS Coronavirus 2 by RT PCR NEGATIVE NEGATIVE Final    Comment: (NOTE) SARS-CoV-2 target nucleic acids are NOT DETECTED.  The SARS-CoV-2 RNA is generally detectable in upper respiratory specimens during the acute phase of infection. The lowest concentration of SARS-CoV-2 viral copies this assay can detect is 138 copies/mL. A negative result does not preclude SARS-Cov-2 infection and should not be used as the sole basis for treatment or other patient management  decisions. A negative result may occur with  improper specimen collection/handling, submission of specimen other than nasopharyngeal swab, presence of viral mutation(s) within the areas targeted by this assay, and inadequate number of viral copies(<138 copies/mL). A negative result must be combined with clinical observations, patient history, and epidemiological information. The expected result is Negative.  Fact Sheet for Patients:  EntrepreneurPulse.com.au  Fact Sheet for Healthcare Providers:  IncredibleEmployment.be  This test is no t yet approved or cleared by the [place] FDA and  has been authorized for detection and/or diagnosis of SARS-CoV-2 by FDA under an Emergency Use Authorization (EUA). This EUA will remain  in effect (meaning this test can be used) for the duration of the COVID-19 declaration under Section 564(b)(1) of the Act, 21 U.S.C.section 360bbb-3(b)(1), unless the authorization is terminated  or revoked sooner.       Influenza A by PCR NEGATIVE NEGATIVE Final  Influenza B by PCR NEGATIVE NEGATIVE Final    Comment: (NOTE) The Xpert Xpress SARS-CoV-2/FLU/RSV plus assay is intended as an aid in the diagnosis of influenza from Nasopharyngeal swab specimens and should not be used as a sole basis for treatment. Nasal washings and aspirates are unacceptable for Xpert Xpress SARS-CoV-2/FLU/RSV testing.  Fact Sheet for Patients: EntrepreneurPulse.com.au  Fact Sheet for Healthcare Providers: IncredibleEmployment.be  This test is not yet approved or cleared by the [place] FDA and has been authorized for detection and/or diagnosis of SARS-CoV-2 by FDA under an Emergency Use Authorization (EUA). This EUA will remain in effect (meaning this test can be used) for the duration of the COVID-19 declaration under Section 564(b)(1) of the Act, 21 U.S.C. section 360bbb-3(b)(1), unless the  authorization is terminated or revoked.  Performed at [facility], [address], [place], [place] [address]          Radiology Studies: DG Chest 2 View  Result Date: 03/22/2021 CLINICAL DATA:  Shortness of breath and cough for 1 month. EXAM: CHEST - 2 VIEW COMPARISON:  03/10/2021 FINDINGS: Postoperative changes in the mediastinum. Cardiac enlargement with diffuse bilateral airspace and interstitial infiltrates, likely edema. Small bilateral pleural effusions. Similar appearance to previous study. Calcification of the aorta. No pneumothorax. IMPRESSION: Cardiac enlargement with bilateral pulmonary infiltrates and small effusions. Electronically Signed   By: Lucienne Capers M.D.   On: 03/22/2021 00:18        Scheduled Meds:  aspirin EC  81 mg Oral Daily   atorvastatin  40 mg Oral Daily   clopidogrel  75 mg Oral Daily   enoxaparin (LOVENOX) injection  40 mg Subcutaneous Q24H   furosemide  40 mg Intravenous Q12H   hydrALAZINE  25 mg Oral Q8H   metoprolol succinate  50 mg Oral Q breakfast   pantoprazole  40 mg Oral Daily   ramipril  10 mg Oral Daily   Continuous Infusions:   LOS: 1 day    Time spent: 28 minutes    Sharen Hones, MD [facility]   To contact the attending provider between 7A-7P or the covering provider during after hours 7P-7A, please log into the web site [email or web address] and access using universal [place] password for that web site. If you do not have the password, please call the hospital operator.  03/23/2021, 12:23 PM

## 2021-03-24 DIAGNOSIS — N184 Chronic kidney disease, stage 4 (severe): Secondary | ICD-10-CM | POA: Diagnosis not present

## 2021-03-24 DIAGNOSIS — E871 Hypo-osmolality and hyponatremia: Secondary | ICD-10-CM | POA: Diagnosis not present

## 2021-03-24 DIAGNOSIS — I5033 Acute on chronic diastolic (congestive) heart failure: Secondary | ICD-10-CM | POA: Diagnosis not present

## 2021-03-24 DIAGNOSIS — I5043 Acute on chronic combined systolic (congestive) and diastolic (congestive) heart failure: Secondary | ICD-10-CM | POA: Diagnosis not present

## 2021-03-24 DIAGNOSIS — I130 Hypertensive heart and chronic kidney disease with heart failure and stage 1 through stage 4 chronic kidney disease, or unspecified chronic kidney disease: Secondary | ICD-10-CM | POA: Diagnosis not present

## 2021-03-24 LAB — BASIC METABOLIC PANEL
Anion gap: 7 (ref 5–15)
BUN: 30 mg/dL — ABNORMAL HIGH (ref 8–23)
CO2: 28 mmol/L (ref 22–32)
Calcium: 8.9 mg/dL (ref 8.9–10.3)
Chloride: 100 mmol/L (ref 98–111)
Creatinine, Ser: 1.37 mg/dL — ABNORMAL HIGH (ref 0.61–1.24)
GFR, Estimated: 51 mL/min — ABNORMAL LOW (ref >60)
Glucose, Bld: 92 mg/dL (ref 70–99)
Potassium: 3.9 mmol/L (ref 3.5–5.1)
Sodium: 135 mmol/L (ref 135–145)

## 2021-03-24 LAB — MAGNESIUM: Magnesium: 1.9 mg/dL (ref 1.7–2.4)

## 2021-03-24 LAB — GLUCOSE, CAPILLARY: Glucose-Capillary: 103 mg/dL — ABNORMAL HIGH (ref 70–99)

## 2021-03-24 MED ORDER — POLYETHYLENE GLYCOL 3350 17 G PO PACK
17.0000 g | PACK | Freq: Every day | ORAL | Status: DC
  Administered 2021-03-25: 17 g via ORAL
  Filled 2021-03-24: qty 1

## 2021-03-24 MED ORDER — TORSEMIDE 20 MG PO TABS
20.0000 mg | ORAL_TABLET | Freq: Two times a day (BID) | ORAL | Status: DC
  Administered 2021-03-24 – 2021-03-25 (×2): 20 mg via ORAL
  Filled 2021-03-24 (×2): qty 1

## 2021-03-24 NOTE — Plan of Care (Signed)
  Problem: Education: Goal: Knowledge of General Education information will improve Description: Including pain rating scale, medication(s)/side effects and non-pharmacologic comfort measures Outcome: Progressing   Problem: Clinical Measurements: Goal: Respiratory complications will improve Outcome: Progressing   Problem: Safety: Goal: Ability to remain free from injury will improve Outcome: Progressing   

## 2021-03-24 NOTE — Progress Notes (Signed)
Cardiology progress note [facility] Cardiology    SUBJECTIVE: Patient states he feels reasonably well denies any pain shortness of breath is improved no leg edema   Vitals:   03/24/21 0212 03/24/21 0437 03/24/21 0737 03/24/21 1125  BP: (!) 143/58 (!) 147/73 (!) 148/62 (!) 152/63  Pulse: (!) 56 (!) 55 (!) 55 (!) 57  Resp: '18 15 17 18  '$ Temp: 98.5 F (36.9 C)  97.8 F (36.6 C) (!) 97.5 F (36.4 C)  TempSrc:    Oral  SpO2: 96% 97% 95% 100%  Weight:      Height:         Intake/Output Summary (Last 24 hours) at 03/24/2021 1257 Last data filed at 03/24/2021 1125 Gross per 24 hour  Intake 1001.26 ml  Output 3350 ml  Net -2348.74 ml      PHYSICAL EXAM  General: Well developed, well nourished, in no acute distress HEENT:  Normocephalic and atramatic Neck:  No JVD.  Lungs: Clear bilaterally to auscultation and percussion. Heart: HRRR . Normal S1 and S2 without gallops or murmurs.  Abdomen: Bowel sounds are positive, abdomen soft and non-tender  Msk:  Back normal, normal gait. Normal strength and tone for age. Extremities: No clubbing, cyanosis or edema.   Neuro: Alert and oriented X 3. Psych:  Good affect, responds appropriately   LABS: Basic Metabolic Panel: Recent Labs    03/23/21 0445 03/24/21 0550  NA 132* 135  K 3.2* 3.9  CL 98 100  CO2 27 28  GLUCOSE 84 92  BUN 30* 30*  CREATININE 1.27* 1.37*  CALCIUM 8.7* 8.9  MG 1.9 1.9   Liver Function Tests: Recent Labs    03/21/21 2350  AST 32  ALT 30  ALKPHOS 110  BILITOT 1.0  PROT 7.3  ALBUMIN 3.4*   No results for input(s): LIPASE, AMYLASE in the last 72 hours. CBC: Recent Labs    03/21/21 2350 03/22/21 0321  WBC 7.4 6.5  NEUTROABS 5.1  --   HGB 11.0* 11.2*  HCT 33.7* 33.9*  MCV 85.5 85.0  PLT 244 250   Cardiac Enzymes: No results for input(s): CKTOTAL, CKMB, CKMBINDEX, TROPONINI in the last 72 hours. BNP: Invalid input(s): POCBNP D-Dimer: No results for input(s): DDIMER in the last 72  hours. Hemoglobin A1C: No results for input(s): HGBA1C in the last 72 hours. Fasting Lipid Panel: No results for input(s): CHOL, HDL, LDLCALC, TRIG, CHOLHDL, LDLDIRECT in the last 72 hours. Thyroid Function Tests: Recent Labs    03/22/21 0213  TSH 1.813   Anemia Panel: No results for input(s): VITAMINB12, FOLATE, FERRITIN, TIBC, IRON, RETICCTPCT in the last 72 hours.  No results found.   Echo low normal EF 50 to 55%  TELEMETRY: Normal sinus rhythm 60  ASSESSMENT AND PLAN:  Active Problems:   Moderate major depression, single episode (HCC)   CKD (chronic kidney disease), stage IV (HCC)   Essential hypertension   Acute on chronic diastolic CHF (congestive heart failure) (HCC)   Hyponatremia   1.  Continue diuresis for diastolic heart failure Continue blood pressure management and control Maintain metoprolol ramipril Lasix Correct hypokalemia Have patient follow-up with nephrology for renal insufficiency Follow-up with cardiology and heart failure clinic as an outpatient   Yolonda Kida, MD, 03/24/2021 12:57 PM

## 2021-03-24 NOTE — Plan of Care (Signed)
  Problem: Clinical Measurements: Goal: Will remain free from infection Outcome: Progressing   Problem: Activity: Goal: Risk for activity intolerance will decrease Outcome: Progressing   Problem: Nutrition: Goal: Adequate nutrition will be maintained Outcome: Progressing   Problem: Clinical Measurements: Goal: Will remain free from infection Outcome: Progressing   Problem: Activity: Goal: Risk for activity intolerance will decrease Outcome: Progressing   Problem: Nutrition: Goal: Adequate nutrition will be maintained Outcome: Progressing

## 2021-03-24 NOTE — Progress Notes (Signed)
PROGRESS NOTE    Daniel Bolton  [id number] DOB: Aug 12, 1934 DOA: 03/21/2021 PCP: [name], MD   Chief complaint.  Shortness of breath. Brief Narrative:  Daniel Bolton is a 87 y.o. Hispanic male with medical history significant for hypertension, CHF with previous EF of 50 to 55%, valvular regurgitations and CVA, who presented to the emergency room with acute onset of worsening dyspnea and dry cough for the last week with associated generalized weakness. Chest x-ray showed bilateral lower lobe infiltrates with pleural effusion.  BNP profoundly elevated, he also has significant hyponatremia. He was started on IV Lasix for acute exacerbation of congestive heart failure.  Per record, his most recent ejection fraction was between 50 and 55%.   Assessment & Plan:   Active Problems:   Moderate major depression, single episode (HCC)   CKD (chronic kidney disease), stage IV (HCC)   Essential hypertension   Acute on chronic diastolic CHF (congestive heart failure) (HCC)   Hyponatremia  #1.  Acute on chronic diastolic congestive heart failure. Condition improving, will change diuretics to oral torsemide 20 mg twice a day.  2.  Essential hypertension. Continue metoprolol and  3.  Hyponatremia with hypovolemia. Hypokalemia Sodium level getting better. Repleted potassium.  4.  Coronary disease. Stable.  5.  Constipation. Start MiraLAX.  #6.  Chronic kidney disease stage IV. Slightly worsening renal function today, discontinue IV Lasix.  Recheck a BMP tomorrow.      DVT prophylaxis: Lovenox Code Status: Full Family Communication:  Disposition Plan:    Status is: Inpatient  Remains inpatient appropriate because:Inpatient level of care appropriate due to severity of illness  Dispo: The patient is from: Home              Anticipated d/c is to: Home              Patient currently is not medically stable to d/c.   Difficult to place patient No         I/O last 3 completed shifts: In: 1326.6 [P.O.:1080; IV Piggyback:246.6] Out: [id number] [Urine:5140] Total I/O In: 240 [P.O.:240] Out: 400 [Urine:400]     Consultants:  card  Procedures: none  Antimicrobials: None  Subjective: Patient short of breath much improved.  He slept well last night without any short of breath.  He has not been requiring any oxygen. Is complaining of headache, constipation.  He feels he is not ready to discharge, states that he will come back to the hospital if discharged today. No abdominal pain or nausea vomiting. No fever or chills.   Objective: Vitals:   03/23/21 1623 03/24/21 0212 03/24/21 0437 03/24/21 0737  BP: (!) 164/72 (!) 143/58 (!) 147/73 (!) 148/62  Pulse: (!) 56 (!) 56 (!) 55 (!) 55  Resp: '17 18 15 17  '$ Temp: 97.6 F (36.4 C) 98.5 F (36.9 C)  97.8 F (36.6 C)  TempSrc:      SpO2: 98% 96% 97% 95%  Weight:      Height:        Intake/Output Summary (Last 24 hours) at 03/24/2021 1052 Last data filed at 03/24/2021 1010 Gross per 24 hour  Intake 1001.26 ml  Output 3450 ml  Net -2448.74 ml   Filed Weights   03/21/21 2351 03/22/21 1755 03/23/21 0504  Weight: 55 kg 55.7 kg 55.2 kg    Examination:  General exam: Appears calm and comfortable  Respiratory system: Clear to auscultation. Respiratory effort normal. Cardiovascular system: S1 & S2 heard, RRR. No JVD,  murmurs, rubs, gallops or clicks. No pedal edema. Gastrointestinal system: Abdomen is nondistended, soft and nontender. No organomegaly or masses felt. Normal bowel sounds heard. Central nervous system: Alert and oriented. No focal neurological deficits. Extremities: Symmetric 5 x 5 power. Skin: No rashes, lesions or ulcers Psychiatry: Judgement and insight appear normal. Mood & affect appropriate.     Data Reviewed: I have personally reviewed following labs and imaging studies  CBC: Recent Labs  Lab 03/21/21 2350 03/22/21 0321  WBC 7.4 6.5  NEUTROABS 5.1  --    HGB 11.0* 11.2*  HCT 33.7* 33.9*  MCV 85.5 85.0  PLT 244 [id number]   Basic Metabolic Panel: Recent Labs  Lab 03/21/21 2350 03/22/21 0321 03/23/21 0445 03/24/21 0550  NA 125* 128* 132* 135  K 4.0 3.7 3.2* 3.9  CL 96* 97* 98 100  CO2 '22 23 27 28  '$ GLUCOSE 104* 94 84 92  BUN 31* 31* 30* 30*  CREATININE 1.28* 1.28* 1.27* 1.37*  CALCIUM 8.3* 8.6* 8.7* 8.9  MG  --   --  1.9 1.9   GFR: Estimated Creatinine Clearance: 30.8 mL/min (A) (by C-G formula based on SCr of 1.37 mg/dL (H)). Liver Function Tests: Recent Labs  Lab 03/21/21 2350  AST 32  ALT 30  ALKPHOS 110  BILITOT 1.0  PROT 7.3  ALBUMIN 3.4*   No results for input(s): LIPASE, AMYLASE in the last 168 hours. No results for input(s): AMMONIA in the last 168 hours. Coagulation Profile: No results for input(s): INR, PROTIME in the last 168 hours. Cardiac Enzymes: No results for input(s): CKTOTAL, CKMB, CKMBINDEX, TROPONINI in the last 168 hours. BNP (last 3 results) No results for input(s): PROBNP in the last 8760 hours. HbA1C: No results for input(s): HGBA1C in the last 72 hours. CBG: Recent Labs  Lab 03/24/21 0436  GLUCAP 103*   Lipid Profile: No results for input(s): CHOL, HDL, LDLCALC, TRIG, CHOLHDL, LDLDIRECT in the last 72 hours. Thyroid Function Tests: Recent Labs    03/22/21 0213  TSH 1.813   Anemia Panel: No results for input(s): VITAMINB12, FOLATE, FERRITIN, TIBC, IRON, RETICCTPCT in the last 72 hours. Sepsis Labs: No results for input(s): PROCALCITON, LATICACIDVEN in the last 168 hours.  Recent Results (from the past 240 hour(s))  Resp Panel by RT-PCR (Flu A&B, Covid) Nasopharyngeal Swab     Status: None   Collection Time: 03/21/21 11:56 PM   Specimen: Nasopharyngeal Swab; Nasopharyngeal(NP) swabs in vial transport medium  Result Value Ref Range Status   SARS Coronavirus 2 by RT PCR NEGATIVE NEGATIVE Final    Comment: (NOTE) SARS-CoV-2 target nucleic acids are NOT DETECTED.  The SARS-CoV-2 RNA  is generally detectable in upper respiratory specimens during the acute phase of infection. The lowest concentration of SARS-CoV-2 viral copies this assay can detect is 138 copies/mL. A negative result does not preclude SARS-Cov-2 infection and should not be used as the sole basis for treatment or other patient management decisions. A negative result may occur with  improper specimen collection/handling, submission of specimen other than nasopharyngeal swab, presence of viral mutation(s) within the areas targeted by this assay, and inadequate number of viral copies(<138 copies/mL). A negative result must be combined with clinical observations, patient history, and epidemiological information. The expected result is Negative.  Fact Sheet for Patients:  EntrepreneurPulse.com.au  Fact Sheet for Healthcare Providers:  IncredibleEmployment.be  This test is no t yet approved or cleared by the [place] FDA and  has been authorized for detection and/or diagnosis  of SARS-CoV-2 by FDA under an Emergency Use Authorization (EUA). This EUA will remain  in effect (meaning this test can be used) for the duration of the COVID-19 declaration under Section 564(b)(1) of the Act, 21 U.S.C.section 360bbb-3(b)(1), unless the authorization is terminated  or revoked sooner.       Influenza A by PCR NEGATIVE NEGATIVE Final   Influenza B by PCR NEGATIVE NEGATIVE Final    Comment: (NOTE) The Xpert Xpress SARS-CoV-2/FLU/RSV plus assay is intended as an aid in the diagnosis of influenza from Nasopharyngeal swab specimens and should not be used as a sole basis for treatment. Nasal washings and aspirates are unacceptable for Xpert Xpress SARS-CoV-2/FLU/RSV testing.  Fact Sheet for Patients: EntrepreneurPulse.com.au  Fact Sheet for Healthcare Providers: IncredibleEmployment.be  This test is not yet approved or cleared by the  [place] FDA and has been authorized for detection and/or diagnosis of SARS-CoV-2 by FDA under an Emergency Use Authorization (EUA). This EUA will remain in effect (meaning this test can be used) for the duration of the COVID-19 declaration under Section 564(b)(1) of the Act, 21 U.S.C. section 360bbb-3(b)(1), unless the authorization is terminated or revoked.  Performed at [facility], [address], [place], [place] [address]          Radiology Studies: No results found.      Scheduled Meds:  aspirin EC  81 mg Oral Daily   atorvastatin  40 mg Oral Daily   clopidogrel  75 mg Oral Daily   enoxaparin (LOVENOX) injection  40 mg Subcutaneous Q24H   hydrALAZINE  25 mg Oral Q8H   metoprolol succinate  50 mg Oral Q breakfast   pantoprazole  40 mg Oral Daily   polyethylene glycol  17 g Oral Daily   ramipril  10 mg Oral Daily   torsemide  20 mg Oral BID   Continuous Infusions:   LOS: 2 days    Time spent: 28 minutes    Sharen Hones, MD [facility]   To contact the attending provider between 7A-7P or the covering provider during after hours 7P-7A, please log into the web site [email or web address] and access using universal [place] password for that web site. If you do not have the password, please call the hospital operator.  03/24/2021, 10:52 AM

## 2021-03-25 DIAGNOSIS — I5033 Acute on chronic diastolic (congestive) heart failure: Secondary | ICD-10-CM | POA: Diagnosis not present

## 2021-03-25 DIAGNOSIS — E871 Hypo-osmolality and hyponatremia: Secondary | ICD-10-CM | POA: Diagnosis not present

## 2021-03-25 DIAGNOSIS — I5043 Acute on chronic combined systolic (congestive) and diastolic (congestive) heart failure: Secondary | ICD-10-CM | POA: Diagnosis not present

## 2021-03-25 DIAGNOSIS — I130 Hypertensive heart and chronic kidney disease with heart failure and stage 1 through stage 4 chronic kidney disease, or unspecified chronic kidney disease: Secondary | ICD-10-CM | POA: Diagnosis not present

## 2021-03-25 DIAGNOSIS — N184 Chronic kidney disease, stage 4 (severe): Secondary | ICD-10-CM | POA: Diagnosis not present

## 2021-03-25 LAB — BASIC METABOLIC PANEL
Anion gap: 8 (ref 5–15)
BUN: 39 mg/dL — ABNORMAL HIGH (ref 8–23)
CO2: 30 mmol/L (ref 22–32)
Calcium: 8.7 mg/dL — ABNORMAL LOW (ref 8.9–10.3)
Chloride: 97 mmol/L — ABNORMAL LOW (ref 98–111)
Creatinine, Ser: 1.69 mg/dL — ABNORMAL HIGH (ref 0.61–1.24)
GFR, Estimated: 39 mL/min — ABNORMAL LOW (ref >60)
Glucose, Bld: 92 mg/dL (ref 70–99)
Potassium: 3.5 mmol/L (ref 3.5–5.1)
Sodium: 135 mmol/L (ref 135–145)

## 2021-03-25 LAB — MAGNESIUM: Magnesium: 1.9 mg/dL (ref 1.7–2.4)

## 2021-03-25 MED ORDER — POTASSIUM CHLORIDE 20 MEQ PO PACK
40.0000 meq | PACK | Freq: Once | ORAL | Status: AC
  Administered 2021-03-25: 40 meq via ORAL
  Filled 2021-03-25: qty 2

## 2021-03-25 MED ORDER — PANTOPRAZOLE SODIUM 40 MG PO TBEC: 40.0000 mg | DELAYED_RELEASE_TABLET | Freq: Every day | ORAL | 0 refills | Status: DC

## 2021-03-25 NOTE — Progress Notes (Signed)
Cardiology progress note [facility] Cardiology    SUBJECTIVE: Patient denies any worsening shortness of breath denies any chest pain no fever no leg edema feels reasonably well   Vitals:   03/24/21 1125 03/24/21 1602 03/24/21 2012 03/25/21 0428  BP: (!) 152/63 (!) 130/58 (!) 146/70 124/61  Pulse: (!) 57 (!) 54 (!) 59 (!) 57  Resp: '18 17 20 20  '$ Temp: (!) 97.5 F (36.4 C) 97.8 F (36.6 C) (!) 97.5 F (36.4 C) 98.4 F (36.9 C)  TempSrc: Oral  Oral   SpO2: 100% 97% 100% 99%  Weight:      Height:         Intake/Output Summary (Last 24 hours) at 03/25/2021 [id number] Last data filed at 03/25/2021 0429 Gross per 24 hour  Intake 720 ml  Output 1600 ml  Net -880 ml      PHYSICAL EXAM  General: Well developed, well nourished, in no acute distress HEENT:  Normocephalic and atramatic Neck:  No JVD.  Lungs: Clear bilaterally to auscultation and percussion. Heart: HRRR . Normal S1 and S2 without gallops or murmurs.  Abdomen: Bowel sounds are positive, abdomen soft and non-tender  Msk:  Back normal, normal gait. Normal strength and tone for age. Extremities: No clubbing, cyanosis or edema.   Neuro: Alert and oriented X 3. Psych:  Good affect, responds appropriately   LABS: Basic Metabolic Panel: Recent Labs    03/24/21 0550 03/25/21 0448  NA 135 135  K 3.9 3.5  CL 100 97*  CO2 28 30  GLUCOSE 92 92  BUN 30* 39*  CREATININE 1.37* 1.69*  CALCIUM 8.9 8.7*  MG 1.9 1.9   Liver Function Tests: No results for input(s): AST, ALT, ALKPHOS, BILITOT, PROT, ALBUMIN in the last 72 hours. No results for input(s): LIPASE, AMYLASE in the last 72 hours. CBC: No results for input(s): WBC, NEUTROABS, HGB, HCT, MCV, PLT in the last 72 hours. Cardiac Enzymes: No results for input(s): CKTOTAL, CKMB, CKMBINDEX, TROPONINI in the last 72 hours. BNP: Invalid input(s): POCBNP D-Dimer: No results for input(s): DDIMER in the last 72 hours. Hemoglobin A1C: No results for input(s): HGBA1C in the last 72  hours. Fasting Lipid Panel: No results for input(s): CHOL, HDL, LDLCALC, TRIG, CHOLHDL, LDLDIRECT in the last 72 hours. Thyroid Function Tests: No results for input(s): TSH, T4TOTAL, T3FREE, THYROIDAB in the last 72 hours.  Invalid input(s): FREET3 Anemia Panel: No results for input(s): VITAMINB12, FOLATE, FERRITIN, TIBC, IRON, RETICCTPCT in the last 72 hours.  No results found.   Echo preserved left ventricular function EF around 50%  TELEMETRY: Normal sinus rhythm:  ASSESSMENT AND PLAN:  Active Problems:   Moderate major depression, single episode (HCC)   CKD (chronic kidney disease), stage IV (HCC)   Essential hypertension   Acute on chronic diastolic CHF (congestive heart failure) ([place])   Hyponatremia    Plan Agree with current medical therapy Agree with diuresis for congestive heart failure Follow-up with nephrology for renal insufficiency Continue hypertension management and control Diuretics supplemental oxygen blood pressure control for treatment of shortness of breath    Yolonda Kida, MD, 03/25/2021 6:59 AM

## 2021-03-25 NOTE — Progress Notes (Signed)
   Heart Failure Nurse Navigator Note  Met with patient today, he is currently lying in bed.  Discussed taking care of himself and heart failure with the language line interpreter.  Discussed weighing daily, recording (Which he was not doing)  What to report to doctor.  Fluid restriction, which includes any liquid that he swallows. Also discussed 2000 mg sodium restriction -He states he had not been told that by his doctors.    Stressed reading labels.  Spoke with his nurse Page and asked that she give him printed info on low sodium diet.  He has appointment to follow up in heart failure clinic at 11 AM June 27.  Malyia Moro RN [facility]FN

## 2021-03-25 NOTE — Care Management Important Message (Signed)
Important Message  Patient Details  Name: [name] MRN: [id number] Date of Birth: Nov 06, 1933   Medicare Important Message Given:  Yes  Reviewed Medicare IM with assistance of [employer].  Left both English and Spanish versions to reference.     [name] 03/25/2021, 11:51 AM

## 2021-03-25 NOTE — Plan of Care (Signed)
  Problem: Health Behavior/Discharge Planning: Goal: Ability to manage health-related needs will improve Outcome: Progressing   Problem: Clinical Measurements: Goal: Will remain free from infection Outcome: Progressing Goal: Diagnostic test results will improve Outcome: Progressing   

## 2021-03-25 NOTE — Discharge Summary (Signed)
Physician Discharge Summary  Patient ID: Daniel Bolton MRN: [id number] DOB/AGE: 85-Feb-1935 85 y.o.  Admit date: 03/21/2021 Discharge date: 03/25/2021  Admission Diagnoses:  Discharge Diagnoses:  Active Problems:   Moderate major depression, single episode (HCC)   CKD (chronic kidney disease), stage IV ([place])   Essential hypertension   Acute on chronic diastolic CHF (congestive heart failure) ([place])   Hyponatremia   Discharged Condition: good  Hospital Course:   Daniel Bolton is a 87 y.o. Hispanic male with medical history significant for hypertension, CHF with previous EF of 50 to 55%, valvular regurgitations and CVA, who presented to the emergency room with acute onset of worsening dyspnea and dry cough for the last week with associated generalized weakness. Chest x-ray showed bilateral lower lobe infiltrates with pleural effusion.  BNP profoundly elevated, he also has significant hyponatremia. He was started on IV Lasix for acute exacerbation of congestive heart failure.  Per record, his most recent ejection fraction was between 50 and 55%.  #1.  Acute on chronic diastolic congestive heart failure. Patient doing well today, symptom essentially resolved.  At this point, patient is medically stable to be discharged.  Patient be followed by his cardiologist in the near future.  He states that he has appointment with his own cardiologist.  2.  Essential hypertension. Continue metoprolol and  3.  Hyponatremia with hypovolemia. Hypokalemia Sodium level getting better. Conditions all improved.  Patient potassium is 3.5 today, give additional 40 mEq of oral potassium before discharge.  4.  Coronary disease. Stable.  5.  Constipation. Had  bowel movement.   #6.  Chronic kidney disease stage IV. Slightly worsening renal function after Lasix.  Patient has appointment with his nephrologist on 6/30.   Consults: cardiology  Significant Diagnostic Studies:  CHEST - 2  VIEW   COMPARISON:  03/10/2021   FINDINGS: Postoperative changes in the mediastinum. Cardiac enlargement with diffuse bilateral airspace and interstitial infiltrates, likely edema. Small bilateral pleural effusions. Similar appearance to previous study. Calcification of the aorta. No pneumothorax.   IMPRESSION: Cardiac enlargement with bilateral pulmonary infiltrates and small effusions.     Electronically Signed   By: Lucienne Capers M.D.   On: 03/22/2021 00:18       Treatments: IV lasix  Discharge Exam: Blood pressure (!) 121/57, pulse (!) 57, temperature 97.9 F (36.6 C), resp. rate 18, height '5\' 9"'$  (1.753 m), weight 55.2 kg, SpO2 99 %. General appearance: alert and cooperative Resp: clear to auscultation bilaterally Cardio: regular rate and rhythm, S1, S2 normal, no murmur, click, rub or gallop GI: soft, non-tender; bowel sounds normal; no masses,  no organomegaly Extremities: extremities normal, atraumatic, no cyanosis or edema  Disposition: Discharge disposition: 01-Home or Self Care       Discharge Instructions     Diet - low sodium heart healthy   Complete by: As directed    Increase activity slowly   Complete by: As directed       Allergies as of 03/25/2021   No Known Allergies      Medication List     STOP taking these medications    omeprazole 20 MG capsule Commonly known as: PRILOSEC Replaced by: pantoprazole 40 MG tablet       TAKE these medications    albuterol 108 (90 Base) MCG/ACT inhaler Commonly known as: VENTOLIN HFA Inhale 2 puffs into the lungs every 6 (six) hours as needed for wheezing or shortness of breath.   aspirin 81 MG EC tablet Take  1 tablet (81 mg total) by mouth daily. Swallow whole.   clopidogrel 75 MG tablet Commonly known as: PLAVIX Take 75 mg by mouth daily.   furosemide 20 MG tablet Commonly known as: LASIX Take 1 tablet (20 mg total) by mouth daily.   hydrALAZINE 25 MG tablet Commonly known as:  APRESOLINE Take 1 tablet (25 mg total) by mouth every 8 (eight) hours.   metoprolol succinate 50 MG 24 hr tablet Commonly known as: TOPROL-XL Take 50 mg by mouth daily. Take with or immediately following a meal.   pantoprazole 40 MG tablet Commonly known as: PROTONIX Take 1 tablet (40 mg total) by mouth daily. Start taking on: March 26, 2021 Replaces: omeprazole 20 MG capsule   ramipril 2.5 MG capsule Commonly known as: ALTACE Take 2.5 mg by mouth daily.   simvastatin 80 MG tablet Commonly known as: ZOCOR Take 80 mg by mouth daily.       ASK your doctor about these medications    potassium chloride 8 MEQ tablet Commonly known as: KLOR-CON Take 8 mEq by mouth daily.        Follow-up Information     [name], MD Follow up in 1 week(s).   Specialty: Family Medicine Contact information: [address] [place] [address] [phone]                32 minutes Signed: Sharen Hones 03/25/2021, 9:51 AM

## 2021-03-29 NOTE — Progress Notes (Deleted)
   Patient ID: Daniel Bolton, male    DOB: May 03, 1934, 87 y.o.   MRN: [id number]  [facility] interpreter present during entire visit  HPI  Daniel Bolton is a 87 y/o male with a history of  Echo report from 01/14/21 reviewed and showed an EF of 50% along with mild LVH.   Admitted 03/21/21 due to acute on chronic HF. Initially given IV lasix with transition to oral diuretics. Cardiology consult obtained. Electrolytes corrected.      Discharged after 4 days. Admitted 03/10/21 due to acute on chronic HF along with HTN emergency. Initially given IV lasix then it was held due to creatinine bump. Resumed home oral diuretics. Discharged after 4 days.   He presents today for his initial visit with a chief complaint of   Review of Systems    Physical Exam    Assessment & Plan:  1: Chronic heart failure with preserved ejection fraction with structural changes (mild LVH)- - NYHA class - saw cardiology (Fath) 01/17/21 - saw pulmonology Lanney Gins) 02/22/21 - BNP 03/21/21 was 1439.0  2: HTN with CKD- - BP - saw PCP (Bronstein) 12/17/20 - BMP 03/25/21 reviewed and showed sodium 135, potassium 3.5, creatinine 1.69 and GFR 39 - saw nephrology Lanora Manis) 01/09/21: returns 04/04/21

## 2021-04-01 ENCOUNTER — Ambulatory Visit: Payer: Medicare Other | Admitting: Family

## 2021-04-01 ENCOUNTER — Telehealth: Payer: Self-pay | Admitting: Family

## 2021-04-01 NOTE — Telephone Encounter (Signed)
Patient did not show for his Heart Failure Clinic appointment on 04/01/21. Will attempt to reschedule.

## 2021-04-09 ENCOUNTER — Emergency Department: Payer: Medicare HMO

## 2021-04-09 ENCOUNTER — Encounter: Payer: Self-pay | Admitting: *Deleted

## 2021-04-09 ENCOUNTER — Other Ambulatory Visit: Payer: Self-pay

## 2021-04-09 DIAGNOSIS — Z6822 Body mass index (BMI) 22.0-22.9, adult: Secondary | ICD-10-CM

## 2021-04-09 DIAGNOSIS — T501X5A Adverse effect of loop [high-ceiling] diuretics, initial encounter: Secondary | ICD-10-CM | POA: Diagnosis not present

## 2021-04-09 DIAGNOSIS — E876 Hypokalemia: Secondary | ICD-10-CM | POA: Diagnosis not present

## 2021-04-09 DIAGNOSIS — E43 Unspecified severe protein-calorie malnutrition: Secondary | ICD-10-CM | POA: Diagnosis present

## 2021-04-09 DIAGNOSIS — R600 Localized edema: Secondary | ICD-10-CM | POA: Diagnosis not present

## 2021-04-09 DIAGNOSIS — I5043 Acute on chronic combined systolic (congestive) and diastolic (congestive) heart failure: Secondary | ICD-10-CM | POA: Diagnosis present

## 2021-04-09 DIAGNOSIS — E785 Hyperlipidemia, unspecified: Secondary | ICD-10-CM | POA: Diagnosis present

## 2021-04-09 DIAGNOSIS — K219 Gastro-esophageal reflux disease without esophagitis: Secondary | ICD-10-CM | POA: Diagnosis present

## 2021-04-09 DIAGNOSIS — I2510 Atherosclerotic heart disease of native coronary artery without angina pectoris: Secondary | ICD-10-CM | POA: Diagnosis present

## 2021-04-09 DIAGNOSIS — I130 Hypertensive heart and chronic kidney disease with heart failure and stage 1 through stage 4 chronic kidney disease, or unspecified chronic kidney disease: Principal | ICD-10-CM | POA: Diagnosis present

## 2021-04-09 DIAGNOSIS — D6489 Other specified anemias: Secondary | ICD-10-CM | POA: Diagnosis present

## 2021-04-09 DIAGNOSIS — Z951 Presence of aortocoronary bypass graft: Secondary | ICD-10-CM

## 2021-04-09 DIAGNOSIS — Z8673 Personal history of transient ischemic attack (TIA), and cerebral infarction without residual deficits: Secondary | ICD-10-CM

## 2021-04-09 DIAGNOSIS — N184 Chronic kidney disease, stage 4 (severe): Secondary | ICD-10-CM | POA: Diagnosis present

## 2021-04-09 DIAGNOSIS — E871 Hypo-osmolality and hyponatremia: Secondary | ICD-10-CM | POA: Diagnosis present

## 2021-04-09 DIAGNOSIS — Z7982 Long term (current) use of aspirin: Secondary | ICD-10-CM

## 2021-04-09 DIAGNOSIS — Z20822 Contact with and (suspected) exposure to covid-19: Secondary | ICD-10-CM | POA: Diagnosis present

## 2021-04-09 DIAGNOSIS — Z79899 Other long term (current) drug therapy: Secondary | ICD-10-CM

## 2021-04-09 DIAGNOSIS — Z7902 Long term (current) use of antithrombotics/antiplatelets: Secondary | ICD-10-CM

## 2021-04-09 LAB — CBC
HCT: 28.7 % — ABNORMAL LOW (ref 39.0–52.0)
Hemoglobin: 10.1 g/dL — ABNORMAL LOW (ref 13.0–17.0)
MCH: 28.6 pg (ref 26.0–34.0)
MCHC: 35.2 g/dL (ref 30.0–36.0)
MCV: 81.3 fL (ref 80.0–100.0)
Platelets: 147 10*3/uL — ABNORMAL LOW (ref 150–400)
RBC: 3.53 MIL/uL — ABNORMAL LOW (ref 4.22–5.81)
RDW: 13.3 % (ref 11.5–15.5)
WBC: 8.6 10*3/uL (ref 4.0–10.5)
nRBC: 0 % (ref 0.0–0.2)

## 2021-04-09 NOTE — ED Triage Notes (Signed)
Pt son is armanda [phone], please call when pt gets to room

## 2021-04-09 NOTE — ED Triage Notes (Signed)
Via spanish interpreter, has been coughing, abdomina/hands/feet swelling. Hx of CHF. Taking medications as prescribed, but they are making him dizzy. Having pain in shoulder and neck. Sometimes short of breath.

## 2021-04-10 ENCOUNTER — Telehealth: Payer: Self-pay

## 2021-04-10 ENCOUNTER — Inpatient Hospital Stay
Admission: EM | Admit: 2021-04-10 | Discharge: 2021-04-14 | DRG: 291 | Disposition: A | Discharge status: Home Health | Payer: Medicare HMO | Attending: Hospitalist | Admitting: Hospitalist

## 2021-04-10 ENCOUNTER — Encounter: Payer: Self-pay | Admitting: Family Medicine

## 2021-04-10 ENCOUNTER — Inpatient Hospital Stay (HOSPITAL_COMMUNITY)
Admit: 2021-04-10 | Discharge: 2021-04-10 | Disposition: A | Discharge status: Home / Self Care | Payer: Medicare HMO | Attending: Family Medicine | Admitting: Family Medicine

## 2021-04-10 DIAGNOSIS — I2510 Atherosclerotic heart disease of native coronary artery without angina pectoris: Secondary | ICD-10-CM | POA: Diagnosis present

## 2021-04-10 DIAGNOSIS — Z8673 Personal history of transient ischemic attack (TIA), and cerebral infarction without residual deficits: Secondary | ICD-10-CM | POA: Diagnosis not present

## 2021-04-10 DIAGNOSIS — I5041 Acute combined systolic (congestive) and diastolic (congestive) heart failure: Secondary | ICD-10-CM | POA: Diagnosis not present

## 2021-04-10 DIAGNOSIS — Z79899 Other long term (current) drug therapy: Secondary | ICD-10-CM | POA: Diagnosis not present

## 2021-04-10 DIAGNOSIS — E785 Hyperlipidemia, unspecified: Secondary | ICD-10-CM | POA: Diagnosis present

## 2021-04-10 DIAGNOSIS — E871 Hypo-osmolality and hyponatremia: Secondary | ICD-10-CM | POA: Diagnosis present

## 2021-04-10 DIAGNOSIS — Z6822 Body mass index (BMI) 22.0-22.9, adult: Secondary | ICD-10-CM | POA: Diagnosis not present

## 2021-04-10 DIAGNOSIS — R600 Localized edema: Secondary | ICD-10-CM | POA: Diagnosis present

## 2021-04-10 DIAGNOSIS — I5033 Acute on chronic diastolic (congestive) heart failure: Secondary | ICD-10-CM | POA: Diagnosis not present

## 2021-04-10 DIAGNOSIS — K219 Gastro-esophageal reflux disease without esophagitis: Secondary | ICD-10-CM | POA: Diagnosis present

## 2021-04-10 DIAGNOSIS — I5043 Acute on chronic combined systolic (congestive) and diastolic (congestive) heart failure: Secondary | ICD-10-CM | POA: Diagnosis present

## 2021-04-10 DIAGNOSIS — E43 Unspecified severe protein-calorie malnutrition: Secondary | ICD-10-CM | POA: Diagnosis present

## 2021-04-10 DIAGNOSIS — E876 Hypokalemia: Secondary | ICD-10-CM | POA: Diagnosis not present

## 2021-04-10 DIAGNOSIS — I5031 Acute diastolic (congestive) heart failure: Secondary | ICD-10-CM | POA: Diagnosis not present

## 2021-04-10 DIAGNOSIS — I130 Hypertensive heart and chronic kidney disease with heart failure and stage 1 through stage 4 chronic kidney disease, or unspecified chronic kidney disease: Secondary | ICD-10-CM | POA: Diagnosis present

## 2021-04-10 DIAGNOSIS — Z7982 Long term (current) use of aspirin: Secondary | ICD-10-CM | POA: Diagnosis not present

## 2021-04-10 DIAGNOSIS — T501X5A Adverse effect of loop [high-ceiling] diuretics, initial encounter: Secondary | ICD-10-CM | POA: Diagnosis not present

## 2021-04-10 DIAGNOSIS — I5021 Acute systolic (congestive) heart failure: Secondary | ICD-10-CM | POA: Diagnosis not present

## 2021-04-10 DIAGNOSIS — Z7902 Long term (current) use of antithrombotics/antiplatelets: Secondary | ICD-10-CM | POA: Diagnosis not present

## 2021-04-10 DIAGNOSIS — Z20822 Contact with and (suspected) exposure to covid-19: Secondary | ICD-10-CM | POA: Diagnosis present

## 2021-04-10 DIAGNOSIS — I10 Essential (primary) hypertension: Secondary | ICD-10-CM

## 2021-04-10 DIAGNOSIS — D6489 Other specified anemias: Secondary | ICD-10-CM | POA: Diagnosis present

## 2021-04-10 DIAGNOSIS — Z951 Presence of aortocoronary bypass graft: Secondary | ICD-10-CM | POA: Diagnosis not present

## 2021-04-10 DIAGNOSIS — N184 Chronic kidney disease, stage 4 (severe): Secondary | ICD-10-CM | POA: Diagnosis present

## 2021-04-10 LAB — ECHOCARDIOGRAM COMPLETE
AR max vel: 2.43 cm2
AV Area VTI: 2.47 cm2
AV Area mean vel: 2.14 cm2
AV Mean grad: 3 mmHg
AV Peak grad: 5.4 mmHg
Ao pk vel: 1.16 m/s
Area-P 1/2: 2.85 cm2
Calc EF: 27.1 %
MV M vel: 5.63 m/s
MV Peak grad: 126.8 mmHg
MV VTI: 1.61 cm2
P 1/2 time: 649 msec
Radius: 0.4 cm
S' Lateral: 4.2 cm
Single Plane A2C EF: 29.6 %
Single Plane A4C EF: 27.3 %

## 2021-04-10 LAB — TROPONIN I (HIGH SENSITIVITY)
Troponin I (High Sensitivity): 15 ng/L (ref <18)
Troponin I (High Sensitivity): 15 ng/L (ref <18)

## 2021-04-10 LAB — BASIC METABOLIC PANEL
Anion gap: 6 (ref 5–15)
Anion gap: 7 (ref 5–15)
BUN: 31 mg/dL — ABNORMAL HIGH (ref 8–23)
BUN: 32 mg/dL — ABNORMAL HIGH (ref 8–23)
CO2: 25 mmol/L (ref 22–32)
CO2: 29 mmol/L (ref 22–32)
Calcium: 8.2 mg/dL — ABNORMAL LOW (ref 8.9–10.3)
Calcium: 8.4 mg/dL — ABNORMAL LOW (ref 8.9–10.3)
Chloride: 80 mmol/L — ABNORMAL LOW (ref 98–111)
Chloride: 81 mmol/L — ABNORMAL LOW (ref 98–111)
Creatinine, Ser: 1.4 mg/dL — ABNORMAL HIGH (ref 0.61–1.24)
Creatinine, Ser: 1.45 mg/dL — ABNORMAL HIGH (ref 0.61–1.24)
GFR, Estimated: 47 mL/min — ABNORMAL LOW (ref >60)
GFR, Estimated: 49 mL/min — ABNORMAL LOW (ref >60)
Glucose, Bld: 111 mg/dL — ABNORMAL HIGH (ref 70–99)
Glucose, Bld: 99 mg/dL (ref 70–99)
Potassium: 3.6 mmol/L (ref 3.5–5.1)
Potassium: 4 mmol/L (ref 3.5–5.1)
Sodium: 113 mmol/L — CL (ref 135–145)
Sodium: 115 mmol/L — CL (ref 135–145)

## 2021-04-10 LAB — CBC
HCT: 28.2 % — ABNORMAL LOW (ref 39.0–52.0)
Hemoglobin: 10 g/dL — ABNORMAL LOW (ref 13.0–17.0)
MCH: 28.6 pg (ref 26.0–34.0)
MCHC: 35.5 g/dL (ref 30.0–36.0)
MCV: 80.6 fL (ref 80.0–100.0)
Platelets: 139 10*3/uL — ABNORMAL LOW (ref 150–400)
RBC: 3.5 MIL/uL — ABNORMAL LOW (ref 4.22–5.81)
RDW: 13.3 % (ref 11.5–15.5)
WBC: 7.5 10*3/uL (ref 4.0–10.5)
nRBC: 0 % (ref 0.0–0.2)

## 2021-04-10 LAB — RESP PANEL BY RT-PCR (FLU A&B, COVID) ARPGX2
Influenza A by PCR: NEGATIVE
Influenza B by PCR: NEGATIVE
SARS Coronavirus 2 by RT PCR: NEGATIVE

## 2021-04-10 LAB — TSH: TSH: 1.476 u[IU]/mL (ref 0.350–4.500)

## 2021-04-10 LAB — BRAIN NATRIURETIC PEPTIDE: B Natriuretic Peptide: 1726.5 pg/mL — ABNORMAL HIGH (ref 0.0–100.0)

## 2021-04-10 LAB — OSMOLALITY: Osmolality: 252 mOsm/kg — ABNORMAL LOW (ref 275–295)

## 2021-04-10 MED ORDER — CLOPIDOGREL BISULFATE 75 MG PO TABS
75.0000 mg | ORAL_TABLET | Freq: Every day | ORAL | Status: DC
  Administered 2021-04-10 – 2021-04-14 (×5): 75 mg via ORAL
  Filled 2021-04-10 (×5): qty 1

## 2021-04-10 MED ORDER — ONDANSETRON HCL 4 MG/2ML IJ SOLN: 4.0000 mg | Freq: Four times a day (QID) | INTRAMUSCULAR | Status: DC | PRN

## 2021-04-10 MED ORDER — METOPROLOL SUCCINATE ER 50 MG PO TB24
50.0000 mg | ORAL_TABLET | Freq: Every day | ORAL | Status: DC
  Administered 2021-04-11: 50 mg via ORAL
  Filled 2021-04-10 (×2): qty 1

## 2021-04-10 MED ORDER — PERFLUTREN LIPID MICROSPHERE
1.0000 mL | INTRAVENOUS | Status: AC | PRN
  Administered 2021-04-10: 2 mL via INTRAVENOUS
  Filled 2021-04-10: qty 10

## 2021-04-10 MED ORDER — FUROSEMIDE 10 MG/ML IJ SOLN
40.0000 mg | Freq: Two times a day (BID) | INTRAMUSCULAR | Status: DC
  Administered 2021-04-10 – 2021-04-11 (×3): 40 mg via INTRAVENOUS
  Filled 2021-04-10 (×3): qty 4

## 2021-04-10 MED ORDER — ASPIRIN EC 81 MG PO TBEC
81.0000 mg | DELAYED_RELEASE_TABLET | Freq: Every day | ORAL | Status: DC
  Administered 2021-04-10 – 2021-04-14 (×5): 81 mg via ORAL
  Filled 2021-04-10 (×5): qty 1

## 2021-04-10 MED ORDER — ALBUTEROL SULFATE (2.5 MG/3ML) 0.083% IN NEBU: 2.5000 mg | INHALATION_SOLUTION | Freq: Four times a day (QID) | RESPIRATORY_TRACT | Status: DC | PRN

## 2021-04-10 MED ORDER — ACETAMINOPHEN 650 MG RE SUPP: 650.0000 mg | Freq: Four times a day (QID) | RECTAL | Status: DC | PRN

## 2021-04-10 MED ORDER — ATORVASTATIN CALCIUM 20 MG PO TABS
40.0000 mg | ORAL_TABLET | Freq: Every day | ORAL | Status: DC
  Administered 2021-04-10 – 2021-04-14 (×5): 40 mg via ORAL
  Filled 2021-04-10 (×5): qty 2

## 2021-04-10 MED ORDER — ONDANSETRON HCL 4 MG PO TABS: 4.0000 mg | ORAL_TABLET | Freq: Four times a day (QID) | ORAL | Status: DC | PRN

## 2021-04-10 MED ORDER — ENOXAPARIN SODIUM 40 MG/0.4ML IJ SOSY
40.0000 mg | PREFILLED_SYRINGE | INTRAMUSCULAR | Status: DC
  Administered 2021-04-10 – 2021-04-13 (×4): 40 mg via SUBCUTANEOUS
  Filled 2021-04-10 (×4): qty 0.4

## 2021-04-10 MED ORDER — ACETAMINOPHEN 325 MG PO TABS: 650.0000 mg | ORAL_TABLET | Freq: Four times a day (QID) | ORAL | Status: DC | PRN

## 2021-04-10 MED ORDER — PANTOPRAZOLE SODIUM 40 MG PO TBEC
40.0000 mg | DELAYED_RELEASE_TABLET | Freq: Every day | ORAL | Status: DC
  Administered 2021-04-10 – 2021-04-14 (×5): 40 mg via ORAL
  Filled 2021-04-10 (×5): qty 1

## 2021-04-10 MED ORDER — MAGNESIUM HYDROXIDE 400 MG/5ML PO SUSP: 30.0000 mL | Freq: Every day | ORAL | Status: DC | PRN

## 2021-04-10 MED ORDER — FUROSEMIDE 10 MG/ML IJ SOLN
60.0000 mg | Freq: Once | INTRAMUSCULAR | Status: AC
  Administered 2021-04-10: 60 mg via INTRAVENOUS
  Filled 2021-04-10: qty 8

## 2021-04-10 MED ORDER — TRAZODONE HCL 50 MG PO TABS: 25.0000 mg | ORAL_TABLET | Freq: Every evening | ORAL | Status: DC | PRN

## 2021-04-10 NOTE — ED Notes (Signed)
Pt. Settled into hospital bed, warm blankets and pillow provided. Pt. Updated on POC, and introduced to this RN via interpreter, Odin. Pt. Expressed desire for water and milk, and received it.

## 2021-04-10 NOTE — H&P (Addendum)
[place]   PATIENT NAME: Daniel Bolton    MR#:  [id number]  DATE OF BIRTH:  [birth year]  DATE OF ADMISSION:  04/10/2021  PRIMARY CARE PHYSICIAN: [name], MD   Patient is coming from: Home  REQUESTING/REFERRING PHYSICIAN: Vladimir Crofts, MD  CHIEF COMPLAINT:   Chief Complaint  Patient presents with  . Foot Swelling  The patient is Spanish-speaking only.  Information below was obtained by Spanish interpreter.  HISTORY OF PRESENT ILLNESS:  Daniel Bolton is a 87 y.o. Hispanic male with medical history significant for hypertension, CHF with previous EF of 50 to 55%, valvular regurgitations and CVA, who presented to the emergency room with acute onset of` bilateral lower extremity edema as well as associated dyspnea and cough.  He was recently admitted for acute CHF and hyponatremia.  For the last 1 to 2 weeks his lower extremity edema has been worsening.  He has been experiencing orthopnea and mild productive cough.  No chest pain or palpitations.  No nausea or vomiting or abdominal pain.  No headache or dizziness or blurred vision.  He has been compliant with his Lasix and the rest of his medications. ED Course: When he came to the ER blood pressure was 147/72 with a heart rate of 57 and otherwise normal vital signs.  Labs revealed significant elevated BNP of 1726.5 and a BUN of 31 with creatinine 1.45 compared to 1.69 on 6/20 with calcium of 8.2 and sodium of 113 compared to 135 then.  CBC showed anemia slightly worse than previous levels and thrombocytopenia of 147 compared to 250 on 6/17.COVID-19 PCR and influenza antigens came back negative.    Serum osmolality was 252 and high-sensitivity troponin I was 15. EKG as reviewed by me : Showed sinus bradycardia with rate 56 with premature atrial complexes and bigeminy, LVH with repolarization abnormality. Imaging: Two-view chest x-ray showed CHF with increased left pleural effusion and pulmonary edema since prior  exam.   The patient was given 60 mg of IV Lasix.  He will be admitted to a progressive unit bed for further evaluation and management.  Past Medical History:  Diagnosis Date  . Hypertension   . Stroke [facility])     PAST SURGICAL HISTORY:   Past Surgical History:  Procedure Laterality Date  . BRAIN SURGERY    . CARDIAC SURGERY      SOCIAL HISTORY:   Social History   Tobacco Use  . Smoking status: Never  . Smokeless tobacco: Never  Substance Use Topics  . Alcohol use: No    FAMILY HISTORY:  No family history on file. No pertinent familial diseases. DRUG ALLERGIES:  No Known Allergies  REVIEW OF SYSTEMS:   ROS As per history of present illness. All pertinent systems were reviewed above. Constitutional, HEENT, cardiovascular, respiratory, GI, GU, musculoskeletal, neuro, psychiatric, endocrine, integumentary and hematologic systems were reviewed and are otherwise negative/unremarkable except for positive findings mentioned above in the HPI.   MEDICATIONS AT HOME:   Prior to Admission medications   Medication Sig Start Date End Date Taking? Authorizing Provider  albuterol (VENTOLIN HFA) 108 (90 Base) MCG/ACT inhaler Inhale 2 puffs into the lungs every 6 (six) hours as needed for wheezing or shortness of breath.    [provider]  aspirin EC 81 MG EC tablet Take 1 tablet (81 mg total) by mouth daily. Swallow whole. 03/14/21   Danford, Suann Larry, MD  clopidogrel (PLAVIX) 75 MG tablet Take 75 mg by mouth  daily.    [provider]  furosemide (LASIX) 20 MG tablet Take 1 tablet (20 mg total) by mouth daily. 03/14/21   Danford, Suann Larry, MD  hydrALAZINE (APRESOLINE) 25 MG tablet Take 1 tablet (25 mg total) by mouth every 8 (eight) hours. 03/14/21   Danford, Suann Larry, MD  metoprolol succinate (TOPROL-XL) 50 MG 24 hr tablet Take 50 mg by mouth daily. Take with or immediately following a meal.    [provider]  pantoprazole (PROTONIX) 40 MG  tablet Take 1 tablet (40 mg total) by mouth daily. 03/26/21   Sharen Hones, MD  potassium chloride (KLOR-CON) 8 MEQ tablet Take 8 mEq by mouth daily. Patient not taking: No sig reported 01/02/21   [provider]  ramipril (ALTACE) 2.5 MG capsule Take 2.5 mg by mouth daily.    [provider]  simvastatin (ZOCOR) 80 MG tablet Take 80 mg by mouth daily.    [provider]      VITAL SIGNS:  Blood pressure (!) 169/65, pulse (!) 56, temperature (!) 97.4 F (36.3 C), temperature source Oral, resp. rate 19, SpO2 95 %.  PHYSICAL EXAMINATION:  Physical Exam  GENERAL:  87 y.o.-year-old Hispanic male patient lying in the bed with no acute distress.  EYES: Pupils equal, round, reactive to light and accommodation. No scleral icterus. Extraocular muscles intact.  HEENT: Head atraumatic, normocephalic. Oropharynx and nasopharynx clear.  NECK:  Supple, no jugular venous distention. No thyroid enlargement, no tenderness.  LUNGS: Diminished bibasilar breath sounds with bibasilar rales. CARDIOVASCULAR: Regular rate and rhythm, S1, S2 normal. No murmurs, rubs, or gallops.  ABDOMEN: Soft, nondistended, nontender. Bowel sounds present. No organomegaly or mass.  EXTREMITIES: 5 5 NEUROLOGIC: Cranial nerves II through XII are intact. Muscle strength 5/5 in all extremities. Sensation intact. Gait not checked.  PSYCHIATRIC: The patient is alert and oriented x 3.  Normal affect and good eye contact. SKIN: No obvious rash, lesion, or ulcer.   LABORATORY PANEL:   CBC Recent Labs  Lab 04/09/21 2340  WBC 8.6  HGB 10.1*  HCT 28.7*  PLT 147*   ------------------------------------------------------------------------------------------------------------------  Chemistries  Recent Labs  Lab 04/09/21 2340  NA 113*  K 4.0  CL 81*  CO2 25  GLUCOSE 99  BUN 31*  CREATININE 1.45*  CALCIUM 8.2*    ------------------------------------------------------------------------------------------------------------------  Cardiac Enzymes No results for input(s): TROPONINI in the last 168 hours. ------------------------------------------------------------------------------------------------------------------  RADIOLOGY:  DG Chest 2 View  Result Date: 04/09/2021 CLINICAL DATA:  Cough.  CHF.  Abdominal and extremity swelling. EXAM: CHEST - 2 VIEW COMPARISON:  Most recent radiograph 03/22/2021 FINDINGS: Post median sternotomy and CABG. Unchanged cardiomegaly. Aortic atherosclerosis. Increasing left pleural effusion with stable right pleural effusion. Increasing fluid in the fissures. Increasing pulmonary edema. No pneumothorax. Stable osseous structures. IMPRESSION: CHF. Increasing left pleural effusion and pulmonary edema since prior exam. Electronically Signed   By: Keith Rake M.D.   On: 04/09/2021 23:53      IMPRESSION AND PLAN:  Active Problems:   * No active hospital problems. *  1.  Acute on chronic CHF with history of systolic CHF.  Most recent EF was 50 to 55% in April/2022.  The patient has associated hyponatremia likely due to volume overload. - The patient will be admitted to a progressive unit bed. - We will continue diuresis with IV Lasix. - We will follow serial troponin I's. - We will obtain a cardiology consultation in a.m. - I notified Dr. Clayborn Bigness with about  the patient. - We will continue beta-blocker therapy as well as ACE inhibitor therapy with ramipril and vasodilator therapy with hydralazine. - We will check hyponatremia work-up and monitor sodium with diuresis.  2.  Essential hypertension. - We will continue Toprol-XL and ramipril as well as hydralazine.  3.  Dyslipidemia. - We will continue statin therapy.  4.  Coronary artery disease status post CABG. - We will continue aspirin and Plavix.  5.  GERD. - We will resume PPI therapy.   DVT prophylaxis:  Lovenox. Code Status: full code. Family Communication:  The plan of care was discussed in details with the patient (and his daughter who was with him in the room). I answered all questions. The patient agreed to proceed with the above mentioned plan. Further management will depend upon hospital course. Disposition Plan: Back to previous home environment Consults called: Cardiology All the records are reviewed and case discussed with ED provider. Status is: Inpatient  Remains inpatient appropriate because:Ongoing diagnostic testing needed not appropriate for outpatient work up, Unsafe d/c plan, IV treatments appropriate due to intensity of illness or inability to take PO, and Inpatient level of care appropriate due to severity of illness  Dispo: The patient is from: Home              Anticipated d/c is to: Home              Patient currently is not medically stable to d/c.   Difficult to place patient No   TOTAL TIME TAKING CARE OF THIS PATIENT: 55 minutes.    Christel Mormon M.D on 04/10/2021 at 3:30 AM  [facility]   From 7 PM-7 AM, contact night-coverage [email or web address]  CC: Primary care physician; [name], MD

## 2021-04-10 NOTE — ED Provider Notes (Signed)
[facility] Emergency Department Provider Note ____________________________________________   Event Date/Time   First MD Initiated Contact with Patient 04/10/21 0125     (approximate)  I have reviewed the triage vital signs and the nursing notes.  HISTORY  Chief Complaint Foot Swelling   HPI Daniel Bolton is a 87 y.o. malewho presents to the ED for evaluation of lower extremity swelling, shortness of breath and cough.  Chart review indicates recent medical admission about 3 weeks ago for similar presentation.  CHF exacerbation and hyponatremia managed medically.  Patient presents to the ED today for evaluation of 1-2 weeks of worsening lower extremity swelling, shortness of breath, orthopnea and minimally productive cough.  He further reports increasing dizziness, stumbling gait and generalized weakness.  Denies any syncopal episodes, head trauma, fever, abdominal pain, emesis, diarrhea or dysuria.  He reports compliance with his Lasix and other medications.  Spanish interpreter utilized for history and physical   Past Medical History:  Diagnosis Date   Hypertension    Stroke [facility])     Patient Active Problem List   Diagnosis Date Noted   Essential hypertension 03/22/2021   Acute on chronic diastolic CHF (congestive heart failure) ([place]) 03/22/2021   Hyponatremia 03/22/2021   Acute on chronic systolic CHF (congestive heart failure) ([place]) 03/11/2021   Hypertensive urgency 03/11/2021   CKD (chronic kidney disease), stage IV ([place]) 03/11/2021   Normochromic normocytic anemia 03/11/2021   Acute CHF (congestive heart failure) ([place]) 03/11/2021   Moderate major depression, single episode ([place]) 08/01/2015   Coronary artery disease 08/01/2015   Financial problems 08/01/2015    Past Surgical History:  Procedure Laterality Date   BRAIN SURGERY     CARDIAC SURGERY      Prior to Admission medications   Medication Sig Start Date End Date Taking?  Authorizing Provider  albuterol (VENTOLIN HFA) 108 (90 Base) MCG/ACT inhaler Inhale 2 puffs into the lungs every 6 (six) hours as needed for wheezing or shortness of breath.    [provider]  aspirin EC 81 MG EC tablet Take 1 tablet (81 mg total) by mouth daily. Swallow whole. 03/14/21   Danford, Suann Larry, MD  clopidogrel (PLAVIX) 75 MG tablet Take 75 mg by mouth daily.    [provider]  furosemide (LASIX) 20 MG tablet Take 1 tablet (20 mg total) by mouth daily. 03/14/21   Danford, Suann Larry, MD  hydrALAZINE (APRESOLINE) 25 MG tablet Take 1 tablet (25 mg total) by mouth every 8 (eight) hours. 03/14/21   Danford, Suann Larry, MD  metoprolol succinate (TOPROL-XL) 50 MG 24 hr tablet Take 50 mg by mouth daily. Take with or immediately following a meal.    [provider]  pantoprazole (PROTONIX) 40 MG tablet Take 1 tablet (40 mg total) by mouth daily. 03/26/21   Sharen Hones, MD  potassium chloride (KLOR-CON) 8 MEQ tablet Take 8 mEq by mouth daily. Patient not taking: No sig reported 01/02/21   [provider]  ramipril (ALTACE) 2.5 MG capsule Take 2.5 mg by mouth daily.    [provider]  simvastatin (ZOCOR) 80 MG tablet Take 80 mg by mouth daily.    [provider]    Allergies Patient has no known allergies.  No family history on file.  Social History Social History   Tobacco Use   Smoking status: Never   Smokeless tobacco: Never  Substance Use Topics   Alcohol use: No   Drug use: No  Review of Systems  Constitutional: No fever/chills.  Positive generalized weakness Eyes: No visual changes. ENT: No sore throat. Cardiovascular: Denies chest pain. Respiratory: Positive for shortness of breath and cough. Gastrointestinal: No abdominal pain.  No nausea, no vomiting.  No diarrhea.  No constipation. Genitourinary: Negative for dysuria. Musculoskeletal: Negative for back pain. Skin: Negative for rash. Neurological:  Negative for headaches, focal weakness or numbness.  ____________________________________________   PHYSICAL EXAM:  VITAL SIGNS: Vitals:   04/10/21 0100 04/10/21 0115  BP: (!) 171/78   Pulse: (!) 53 (!) 52  Resp: (!) 26 20  Temp:    SpO2: 96% 94%    Constitutional: Alert and oriented. Well appearing and in no acute distress. Eyes: Conjunctivae are normal. PERRL. EOMI. Head: Atraumatic. Nose: No congestion/rhinnorhea. Mouth/Throat: Mucous membranes are moist.  Oropharynx non-erythematous. Neck: No stridor. No cervical spine tenderness to palpation. Cardiovascular: Normal rate, regular rhythm. Grossly normal heart sounds.  Good peripheral circulation. Respiratory: Minimal tachypnea to the low/mid 20s, no further evidence of distress.  Bibasilar crackles are present with decreased breath sounds to the left base.  No wheezing or focal features otherwise. Gastrointestinal: Soft , nontender to palpation. No CVA tenderness. Musculoskeletal:  No joint effusions. No signs of acute trauma. Pitting edema symmetrically to bilateral lower extremities without overlying skin changes or signs of trauma. Neurologic:  Normal speech and language. No gross focal neurologic deficits are appreciated.  Skin:  Skin is warm, dry and intact. No rash noted. Psychiatric: Mood and affect are normal. Speech and behavior are normal. ____________________________________________   LABS (all labs ordered are listed, but only abnormal results are displayed)  Labs Reviewed  BASIC METABOLIC PANEL - Abnormal; Notable for the following components:      Result Value   Sodium 113 (*)    Chloride 81 (*)    BUN 31 (*)    Creatinine, Ser 1.45 (*)    Calcium 8.2 (*)    GFR, Estimated 47 (*)    All other components within normal limits  CBC - Abnormal; Notable for the following components:   RBC 3.53 (*)    Hemoglobin 10.1 (*)    HCT 28.7 (*)    Platelets 147 (*)    All other components within normal limits   BRAIN NATRIURETIC PEPTIDE - Abnormal; Notable for the following components:   B Natriuretic Peptide 1,726.5 (*)    All other components within normal limits  RESP PANEL BY RT-PCR (FLU A&B, COVID) ARPGX2  OSMOLALITY  TROPONIN I (HIGH SENSITIVITY)  TROPONIN I (HIGH SENSITIVITY)   ____________________________________________  12 Lead EKG  Sinus rhythm, rate of 56 bpm.  Normal axis and intervals.  No STEMI. ____________________________________________  RADIOLOGY  ED MD interpretation: 2 view CXR reviewed by me with pulmonary vascular congestion and left-sided small pleural effusion.  No infiltration.  Official radiology report(s): DG Chest 2 View  Result Date: 04/09/2021 CLINICAL DATA:  Cough.  CHF.  Abdominal and extremity swelling. EXAM: CHEST - 2 VIEW COMPARISON:  Most recent radiograph 03/22/2021 FINDINGS: Post median sternotomy and CABG. Unchanged cardiomegaly. Aortic atherosclerosis. Increasing left pleural effusion with stable right pleural effusion. Increasing fluid in the fissures. Increasing pulmonary edema. No pneumothorax. Stable osseous structures. IMPRESSION: CHF. Increasing left pleural effusion and pulmonary edema since prior exam. Electronically Signed   By: Keith Rake M.D.   On: 04/09/2021 23:53    ____________________________________________   PROCEDURES and INTERVENTIONS  Procedure(s) performed (including Critical Care):  .1-3 Lead EKG Interpretation  Date/Time: 04/10/2021 1:49  AM Performed by: Vladimir Crofts, MD Authorized by: Vladimir Crofts, MD     Interpretation: normal     ECG rate:  58   ECG rate assessment: bradycardic     Rhythm: sinus bradycardia     Ectopy: none     Conduction: normal    Medications  furosemide (LASIX) injection 60 mg (60 mg Intravenous Given 04/10/21 0116)    ____________________________________________   MDM / ED COURSE   87 year old male with history of diastolic CHF presents to the ED with evidence of an exacerbation of  this, as well as fairly severe hyponatremia, requiring medical admission.  No hypoxia or distress, though he appears clearly volume overloaded on examination with pitting edema to the extremities and bibasilar crackles.  Blood work demonstrates hyponatremia with a sodium of 113.  He is no evidence of seizures, neurologic deficits.  And no current indications for hypertonic saline.  Further has increased BNP and congested CXR further suggestive of CHF exacerbation.  No evidence of ACS.  No evidence of CAP.  We will initiate diuresis, send some additional diagnostics for his hyponatremia such as serum osmolality, and discuss the case with hospitalist for admission.      ____________________________________________   FINAL CLINICAL IMPRESSION(S) / ED DIAGNOSES  Final diagnoses:  Hyponatremia  Acute on chronic diastolic congestive heart failure [facility])     ED Discharge Orders     None        Manjit Bufano   Note:  This document was prepared using Dragon voice recognition software and may include unintentional dictation errors.    Vladimir Crofts, MD 04/10/21 [phone]

## 2021-04-10 NOTE — ED Notes (Signed)
Critical call from lab Sodium 115, notified MD, will continue to monitor.

## 2021-04-10 NOTE — Progress Notes (Signed)
*  PRELIMINARY RESULTS* Echocardiogram 2D Echocardiogram has been performed.  Daniel Bolton 04/10/2021, 11:45 AM

## 2021-04-10 NOTE — Telephone Encounter (Signed)
  Heart Failure Nurse Navigator Note   Called to speak with patient's daughter Geraldo Pitter and she does not speak [place], so spoke with her daughter [name].  She states she goes to her grandfather's doctor's appointment with him and does the translation.  She states that he takes his medication bottles to appointments.  Patient lives by himself in a mobile home all his children are within 5 to 10 minutes of his home.  The daughters supply his breakfast, lunch and dinner.  She states that they have become more low-sodium conscious  and fix the foods  low sodium and have removed the saltshaker from his home.  He do not feel that he drinks over 64 ounces in a days time.  He does not have a scale to weigh himself, discussed with Ivar Drape that is an important part of his cares and he should be weighing himself daily  every morning after going to the bathroom and recording then reporting a 2 pound weight gain overnight or 5 pounds within the week.  They have set up his medications in his pillbox make sure that he takes them daily.  She had no further questions.  Pricilla Riffle RN [facility]

## 2021-04-10 NOTE — Consult Note (Signed)
[facility] Cardiology  CARDIOLOGY CONSULT NOTE  Patient ID: Daniel Bolton MRN: [id number] DOB/AGE: March 08, 1934 87 y.o.  Admit date: 04/10/2021 Referring Physician Billie Ruddy Primary Physician [facility] Primary Cardiologist Fath Reason for Consultation congestive heart failure  HPI: 87 year old Spanish-speaking gentleman referred for evaluation of congestive heart failure.  The patient was recently hospitalized 03/22/2021 with acute diastolic congestive heart failure.  Recent 2D echocardiogram 01/14/2021 revealed LVEF of 50 to 55% with moderate valvular insufficiencies.  The patient returns with 1 to 2-week history of worsening lower extremity edema with shortness of breath and cough.  The patient has a history of coronary artery disease, status post CABG times 12/2001 at [facility], currently denies chest pain.  High-sensitivity troponin normal (15 and 15 ).  In the emergency room ,blood pressure was 147/72 with a heart rate of 57. BNP was elevated to 1726.5.  Of note, patient hyponatremic, and according to outpatient notes, may have been on both furosemide and HCTZ.  ECG reveals sinus bradycardia at 56 bpm with left ventricular hypertrophy and nonspecific T wave abnormalities laterally.  Chest x-ray revealed CHF with increased left pleural effusion.  Patient was treated with intravenous furosemide 60 mg with modest diuresis with net output of 550 cc.  Patient currently laying flat in bed, denies chest pain, reports overall improvement of shortness of breath.  Review of systems complete and found to be negative unless listed above     Past Medical History:  Diagnosis Date   Hypertension    Stroke [facility])     Past Surgical History:  Procedure Laterality Date   [place]      (Not in a hospital admission)  Social History   Socioeconomic History   Marital status: Married    Spouse name: Not on file   Number of children: Not on file   Years of education: Not on file   Highest education  level: Not on file  Occupational History   Not on file  Tobacco Use   Smoking status: Never   Smokeless tobacco: Never  Substance and Sexual Activity   Alcohol use: No   Drug use: No   Sexual activity: Not on file  Other Topics Concern   Not on file  Social History Narrative   Not on file   Social Determinants of Health   Financial Resource Strain: Not on file  Food Insecurity: Not on file  Transportation Needs: Not on file  Physical Activity: Not on file  Stress: Not on file  Social Connections: Not on file  Intimate Partner Violence: Not on file    No family history on file.    Review of systems complete and found to be negative unless listed above      PHYSICAL EXAM  General: Well developed, well nourished, in no acute distress HEENT:  Normocephalic and atramatic Neck:  No JVD.  Lungs: Clear bilaterally to auscultation and percussion. Heart: HRRR . Normal S1 and S2 without gallops or murmurs.  Abdomen: Bowel sounds are positive, abdomen soft and non-tender  Msk:  Back normal, normal gait. Normal strength and tone for age. Extremities: No clubbing, cyanosis or edema.   Neuro: Alert and oriented X 3. Psych:  Good affect, responds appropriately  Labs:   Lab Results  Component Value Date   WBC 7.5 04/10/2021   HGB 10.0 (L) 04/10/2021   HCT 28.2 (L) 04/10/2021   MCV 80.6 04/10/2021   PLT 139 (L) 04/10/2021    Recent Labs  Lab 04/10/21 0416  NA 115*  K 3.6  CL 80*  CO2 29  BUN 32*  CREATININE 1.40*  CALCIUM 8.4*  GLUCOSE 111*   Lab Results  Component Value Date   CKTOTAL 140 12/11/2020   TROPONINI 0.03 ([place]) 12/29/2016   No results found for: CHOL No results found for: HDL No results found for: LDLCALC No results found for: TRIG No results found for: CHOLHDL No results found for: LDLDIRECT    Radiology: DG Chest 2 View  Result Date: 04/09/2021 CLINICAL DATA:  Cough.  CHF.  Abdominal and extremity swelling. EXAM: CHEST - 2 VIEW COMPARISON:   Most recent radiograph 03/22/2021 FINDINGS: Post median sternotomy and CABG. Unchanged cardiomegaly. Aortic atherosclerosis. Increasing left pleural effusion with stable right pleural effusion. Increasing fluid in the fissures. Increasing pulmonary edema. No pneumothorax. Stable osseous structures. IMPRESSION: CHF. Increasing left pleural effusion and pulmonary edema since prior exam. Electronically Signed   By: Keith Rake M.D.   On: 04/09/2021 23:53   DG Chest 2 View  Result Date: 03/22/2021 CLINICAL DATA:  Shortness of breath and cough for 1 month. EXAM: CHEST - 2 VIEW COMPARISON:  03/10/2021 FINDINGS: Postoperative changes in the mediastinum. Cardiac enlargement with diffuse bilateral airspace and interstitial infiltrates, likely edema. Small bilateral pleural effusions. Similar appearance to previous study. Calcification of the aorta. No pneumothorax. IMPRESSION: Cardiac enlargement with bilateral pulmonary infiltrates and small effusions. Electronically Signed   By: Lucienne Capers M.D.   On: 03/22/2021 00:18    EKG: Sinus bradycardia 56 bpm with LVH and nonspecific T wave abnormalities laterally  ASSESSMENT AND PLAN:   1.  Acute diastolic congestive heart failure, BNP 1726.5, chest x-ray revealing CHF with left pleural effusion, clinical improvement noted after initial diuresis 2.  Coronary artery disease, status post CABG x3, no chest pain, normal high-sensitivity troponin ( 15,15 ), nondiagnostic ECG 3.  Essential hypertension, blood pressure stable on metoprolol succinate and furosemide 4.  Chronic kidney disease, BUN and creatinine 31 and 1.4, respectively 5.  Hyponatremia, sodium 115, patient may have been on both furosemide and HCTZ as outpatient, sodium 135 on 03/25/2021 prior to starting HCTZ 6.  Chronic anemia  Recommendations  1.  Agree with overall current therapy 2.  Continue diuresis 3.  Carefully monitor renal status as well as sodium level 4.  Consider nephrology  consult 5.  Defer further cardiac diagnostics at this time 6.  Hold HCTZ  Signed: Isaias Cowman MD,PhD, [facility] 04/10/2021, 8:12 AM

## 2021-04-10 NOTE — ED Notes (Addendum)
Report called to elana rn cpod nurse   Pt moved to room 36

## 2021-04-10 NOTE — ED Notes (Signed)
Pt voided apprx 200cc urine

## 2021-04-11 DIAGNOSIS — I5021 Acute systolic (congestive) heart failure: Secondary | ICD-10-CM

## 2021-04-11 LAB — BASIC METABOLIC PANEL
Anion gap: 10 (ref 5–15)
BUN: 33 mg/dL — ABNORMAL HIGH (ref 8–23)
CO2: 31 mmol/L (ref 22–32)
Calcium: 8.5 mg/dL — ABNORMAL LOW (ref 8.9–10.3)
Chloride: 83 mmol/L — ABNORMAL LOW (ref 98–111)
Creatinine, Ser: 1.25 mg/dL — ABNORMAL HIGH (ref 0.61–1.24)
GFR, Estimated: 56 mL/min — ABNORMAL LOW (ref >60)
Glucose, Bld: 98 mg/dL (ref 70–99)
Potassium: 2.9 mmol/L — ABNORMAL LOW (ref 3.5–5.1)
Sodium: 124 mmol/L — ABNORMAL LOW (ref 135–145)

## 2021-04-11 LAB — CBC
HCT: 29.2 % — ABNORMAL LOW (ref 39.0–52.0)
Hemoglobin: 10.2 g/dL — ABNORMAL LOW (ref 13.0–17.0)
MCH: 28.2 pg (ref 26.0–34.0)
MCHC: 34.9 g/dL (ref 30.0–36.0)
MCV: 80.7 fL (ref 80.0–100.0)
Platelets: 152 10*3/uL (ref 150–400)
RBC: 3.62 MIL/uL — ABNORMAL LOW (ref 4.22–5.81)
RDW: 13.7 % (ref 11.5–15.5)
WBC: 6.7 10*3/uL (ref 4.0–10.5)
nRBC: 0 % (ref 0.0–0.2)

## 2021-04-11 LAB — NA AND K (SODIUM & POTASSIUM), RAND UR
Potassium Urine: 23 mmol/L
Sodium, Ur: 19 mmol/L

## 2021-04-11 LAB — MAGNESIUM: Magnesium: 1.9 mg/dL (ref 1.7–2.4)

## 2021-04-11 LAB — OSMOLALITY, URINE: Osmolality, Ur: 206 mOsm/kg — ABNORMAL LOW (ref 300–900)

## 2021-04-11 MED ORDER — POTASSIUM CHLORIDE CRYS ER 20 MEQ PO TBCR
40.0000 meq | EXTENDED_RELEASE_TABLET | ORAL | Status: AC
  Administered 2021-04-11 (×2): 40 meq via ORAL
  Filled 2021-04-11 (×2): qty 2

## 2021-04-11 MED ORDER — RAMIPRIL 5 MG PO CAPS
5.0000 mg | ORAL_CAPSULE | Freq: Every day | ORAL | Status: DC
  Administered 2021-04-11 – 2021-04-14 (×4): 5 mg via ORAL
  Filled 2021-04-11 (×4): qty 1

## 2021-04-11 NOTE — Progress Notes (Signed)
Patient in no distress sitting at bedside. Heart Failure video will be provided in spanish.

## 2021-04-11 NOTE — Plan of Care (Signed)
Pt has rested intermittently tonight.  He reports SHOB is improving as well as swelling in his legs.  Interpreter iPad utilized for translation without issues.  Ayesha Mohair BSN RN CMSRN   Problem: Education: Goal: Knowledge of General Education information will improve Description: Including pain rating scale, medication(s)/side effects and non-pharmacologic comfort measures Outcome: Progressing

## 2021-04-11 NOTE — Progress Notes (Signed)
PROGRESS NOTE    Daniel Bolton  [id number] DOB: 09-Mar-1934 DOA: 04/10/2021 PCP: [name], MD  [id number]   Assessment & Plan:   Active Problems:   * No active hospital problems. *   Daniel Bolton is a 87 y.o. Hispanic male with medical history significant for hypertension, CHF with previous EF of 50 to 55%, valvular regurgitations and CVA, who presented to the emergency room with acute onset of` bilateral lower extremity edema as well as associated dyspnea and cough.  He was recently admitted for acute CHF and hyponatremia.  For the last 1 to 2 weeks his lower extremity edema has been worsening.  He has been experiencing orthopnea and mild productive cough.   1.  Acute on chronic systolic CHF  Most recent EF was 50 to 55% in April/2022, however, EF worsened to 25-30% during this hospitalization. --start on IV lasix 40 mg BID and diuresing well. Plan: --IV lasix 40 mg daily --cont Toprol --resume ramipril  Hyponatremia, hypervolemia --Na 113 on presentation, likely due to hypervolemia, and also being on home HCTZ and lasix --Na improved to 124 this morning, by 9 Plan: --slow down diuresis to ensure no rapid correction of Na --hold home HCTZ and lasix  2.  Essential hypertension. --cont Toprol and IV lasix --resume ramipril --home torsemide and HCTZ on hold while on IV lasix  3.  Dyslipidemia. --cont statin  4.  Coronary artery disease status post CABG. --cont ASA and plavix  5.  GERD. --cont PPI  Hypokalemia --from aggressive diuresis --monitor and replete with oral potassium   DVT prophylaxis: Lovenox SQ Code Status: Full code  Family Communication:  Level of care: Progressive Cardiac Dispo:   The patient is from: home Anticipated d/c is to: home Anticipated d/c date is: 2-3 days Patient currently is not medically ready to d/c due to: on IV lasix, hyponatremia still severe   Subjective and Interval History:  Pt reported breathing  and leg swelling improved.     Objective: Vitals:   04/11/21 0721 04/11/21 1120 04/11/21 1534 04/11/21 1954  BP: (!) 126/48 (!) 168/70 (!) 160/64 (!) 151/58  Pulse: (!) 55 (!) 55 (!) 51 (!) 57  Resp: '19 19 19 19  '$ Temp: 98.2 F (36.8 C) 97.9 F (36.6 C) 98.2 F (36.8 C) (!) 97.5 F (36.4 C)  TempSrc: Oral Oral Oral Oral  SpO2: 98% 100% 100% 99%  Weight:      Height:        Intake/Output Summary (Last 24 hours) at 04/12/2021 0224 Last data filed at 04/11/2021 1954 Gross per 24 hour  Intake 1200 ml  Output 2100 ml  Net -900 ml   Filed Weights   04/10/21 1000 04/10/21 1226 04/11/21 0400  Weight: 63 kg 59.5 kg 59 kg    Examination:   Constitutional: NAD, alert, sitting at side of bed HEENT: conjunctivae and lids normal, EOMI CV: No cyanosis.   RESP: normal respiratory effort, on 2L Extremities: pitting edema in bilateral ankles SKIN: warm, dry Neuro: II - XII grossly intact.     Data Reviewed: I have personally reviewed following labs and imaging studies  CBC: Recent Labs  Lab 04/09/21 2340 04/10/21 0416 04/11/21 0538  WBC 8.6 7.5 6.7  HGB 10.1* 10.0* 10.2*  HCT 28.7* 28.2* 29.2*  MCV 81.3 80.6 80.7  PLT 147* 139* 0000000   Basic Metabolic Panel: Recent Labs  Lab 04/09/21 2340 04/10/21 0416 04/11/21 0538  NA 113* 115* 124*  K 4.0 3.6 2.9*  CL 81* 80* 83*  CO2 '25 29 31  '$ GLUCOSE 99 111* 98  BUN 31* 32* 33*  CREATININE 1.45* 1.40* 1.25*  CALCIUM 8.2* 8.4* 8.5*  MG  --   --  1.9   GFR: Estimated Creatinine Clearance: 34.8 mL/min (A) (by C-G formula based on SCr of 1.25 mg/dL (H)). Liver Function Tests: No results for input(s): AST, ALT, ALKPHOS, BILITOT, PROT, ALBUMIN in the last 168 hours. No results for input(s): LIPASE, AMYLASE in the last 168 hours. No results for input(s): AMMONIA in the last 168 hours. Coagulation Profile: No results for input(s): INR, PROTIME in the last 168 hours. Cardiac Enzymes: No results for input(s): CKTOTAL, CKMB,  CKMBINDEX, TROPONINI in the last 168 hours. BNP (last 3 results) No results for input(s): PROBNP in the last 8760 hours. HbA1C: No results for input(s): HGBA1C in the last 72 hours. CBG: No results for input(s): GLUCAP in the last 168 hours. Lipid Profile: No results for input(s): CHOL, HDL, LDLCALC, TRIG, CHOLHDL, LDLDIRECT in the last 72 hours. Thyroid Function Tests: Recent Labs    04/10/21 0416  TSH 1.476   Anemia Panel: No results for input(s): VITAMINB12, FOLATE, FERRITIN, TIBC, IRON, RETICCTPCT in the last 72 hours. Sepsis Labs: No results for input(s): PROCALCITON, LATICACIDVEN in the last 168 hours.  Recent Results (from the past 240 hour(s))  Resp Panel by RT-PCR (Flu A&B, Covid) Nasopharyngeal Swab     Status: None   Collection Time: 04/10/21  1:11 AM   Specimen: Nasopharyngeal Swab; Nasopharyngeal(NP) swabs in vial transport medium  Result Value Ref Range Status   SARS Coronavirus 2 by RT PCR NEGATIVE NEGATIVE Final    Comment: (NOTE) SARS-CoV-2 target nucleic acids are NOT DETECTED.  The SARS-CoV-2 RNA is generally detectable in upper respiratory specimens during the acute phase of infection. The lowest concentration of SARS-CoV-2 viral copies this assay can detect is 138 copies/mL. A negative result does not preclude SARS-Cov-2 infection and should not be used as the sole basis for treatment or other patient management decisions. A negative result may occur with  improper specimen collection/handling, submission of specimen other than nasopharyngeal swab, presence of viral mutation(s) within the areas targeted by this assay, and inadequate number of viral copies(<138 copies/mL). A negative result must be combined with clinical observations, patient history, and epidemiological information. The expected result is Negative.  Fact Sheet for Patients:  EntrepreneurPulse.com.au  Fact Sheet for Healthcare Providers:   IncredibleEmployment.be  This test is no t yet approved or cleared by the [place] FDA and  has been authorized for detection and/or diagnosis of SARS-CoV-2 by FDA under an Emergency Use Authorization (EUA). This EUA will remain  in effect (meaning this test can be used) for the duration of the COVID-19 declaration under Section 564(b)(1) of the Act, 21 U.S.C.section 360bbb-3(b)(1), unless the authorization is terminated  or revoked sooner.       Influenza A by PCR NEGATIVE NEGATIVE Final   Influenza B by PCR NEGATIVE NEGATIVE Final    Comment: (NOTE) The Xpert Xpress SARS-CoV-2/FLU/RSV plus assay is intended as an aid in the diagnosis of influenza from Nasopharyngeal swab specimens and should not be used as a sole basis for treatment. Nasal washings and aspirates are unacceptable for Xpert Xpress SARS-CoV-2/FLU/RSV testing.  Fact Sheet for Patients: EntrepreneurPulse.com.au  Fact Sheet for Healthcare Providers: IncredibleEmployment.be  This test is not yet approved or cleared by the [place] FDA and has been authorized for detection and/or diagnosis of SARS-CoV-2 by FDA  under an Emergency Use Authorization (EUA). This EUA will remain in effect (meaning this test can be used) for the duration of the COVID-19 declaration under Section 564(b)(1) of the Act, 21 U.S.C. section 360bbb-3(b)(1), unless the authorization is terminated or revoked.  Performed at [facility], [address], [place],  [address]       Radiology Studies: ECHOCARDIOGRAM COMPLETE  Result Date: 04/10/2021    ECHOCARDIOGRAM REPORT   Patient Name:   CLABON LURIA [facility] Date of Exam: 04/10/2021 Medical Rec #:  [id number]           Height:       69.0 in Accession #:    [id number]          Weight:       121.8 lb Date of Birth:  1934/04/16           BSA:          1.673 m Patient Age:    11 years            BP:           156/67 mmHg  Patient Gender: M                   HR:           49 bpm. Exam Location:  [facility] Procedure: 2D Echo, Color Doppler, Cardiac Doppler, Strain Analysis and            Intracardiac Opacification Agent Indications:     I50.31 CHF-Acute Diastolic  History:         Patient has no prior history of Echocardiogram examinations.                  Stroke; Risk Factors:Hypertension.  Sonographer:     Charmayne Sheer RDCS (AE) Referring Phys:  [id number] Arvella Merles MANSY Diagnosing Phys: Kate Sable MD  Sonographer Comments: Global longitudinal strain was attempted. IMPRESSIONS  1. Left ventricular ejection fraction, by estimation, is 25 to 30%. The left ventricle has severely decreased function. The left ventricle demonstrates global hypokinesis. There is mild left ventricular hypertrophy. Left ventricular diastolic parameters  are consistent with Grade II diastolic dysfunction (pseudonormalization). The average left ventricular global longitudinal strain is -9.4 %. The global longitudinal strain is abnormal.  2. Right ventricular systolic function is moderately reduced. The right ventricular size is normal.  3. Left atrial size was mild to moderately dilated.  4. Right atrial size was mildly dilated.  5. The mitral valve is normal in structure. Mild to moderate mitral valve regurgitation.  6. Tricuspid valve regurgitation is mild to moderate.  7. The aortic valve is tricuspid. Aortic valve regurgitation is mild. Mild aortic valve sclerosis is present, with no evidence of aortic valve stenosis.  8. The inferior vena cava is dilated in size with >50% respiratory variability, suggesting right atrial pressure of 8 mmHg. FINDINGS  Left Ventricle: Left ventricular ejection fraction, by estimation, is 25 to 30%. The left ventricle has severely decreased function. The left ventricle demonstrates global hypokinesis. Definity contrast agent was given IV to delineate the left ventricular endocardial borders. The average left ventricular global  longitudinal strain is -9.4 %. The global longitudinal strain is abnormal. The left ventricular internal cavity size was normal in size. There is mild left ventricular hypertrophy. Left  ventricular diastolic parameters are consistent with Grade II diastolic dysfunction (pseudonormalization). Right Ventricle: The right ventricular size is normal. No increase in right ventricular wall thickness. Right ventricular systolic  function is moderately reduced. Left Atrium: Left atrial size was mild to moderately dilated. Right Atrium: Right atrial size was mildly dilated. Pericardium: There is no evidence of pericardial effusion. Mitral Valve: The mitral valve is normal in structure. Mild to moderate mitral valve regurgitation. MV peak gradient, 4.8 mmHg. The mean mitral valve gradient is 1.0 mmHg. Tricuspid Valve: The tricuspid valve is normal in structure. Tricuspid valve regurgitation is mild to moderate. Aortic Valve: The aortic valve is tricuspid. Aortic valve regurgitation is mild. Aortic regurgitation PHT measures 649 msec. Mild aortic valve sclerosis is present, with no evidence of aortic valve stenosis. Aortic valve mean gradient measures 3.0 mmHg. Aortic valve peak gradient measures 5.4 mmHg. Aortic valve area, by VTI measures 2.47 cm. Pulmonic Valve: The pulmonic valve was normal in structure. Pulmonic valve regurgitation is mild. Aorta: The aortic root is normal in size and structure. Venous: The inferior vena cava is dilated in size with greater than 50% respiratory variability, suggesting right atrial pressure of 8 mmHg. IAS/Shunts: No atrial level shunt detected by color flow Doppler.  LEFT VENTRICLE PLAX 2D LVIDd:         4.90 cm      Diastology LVIDs:         4.20 cm      LV e' medial:    3.37 cm/s LV PW:         1.20 cm      LV E/e' medial:  26.7 LV IVS:        1.10 cm      LV e' lateral:   4.68 cm/s LVOT diam:     2.10 cm      LV E/e' lateral: 19.2 LV SV:         58 LV SV Index:   35           2D  Longitudinal Strain LVOT Area:     3.46 cm     2D Strain GLS Avg:     -9.4 %  LV Volumes (MOD) LV vol d, MOD A2C: 179.0 ml LV vol d, MOD A4C: 154.0 ml LV vol s, MOD A2C: 126.0 ml LV vol s, MOD A4C: 112.0 ml LV SV MOD A2C:     53.0 ml LV SV MOD A4C:     154.0 ml LV SV MOD BP:      44.8 ml RIGHT VENTRICLE RV Basal diam:  3.90 cm LEFT ATRIUM             Index       RIGHT ATRIUM           Index LA diam:        3.80 cm 2.27 cm/m  RA Area:     18.70 cm LA Vol (A2C):   76.2 ml 45.54 ml/m RA Volume:   54.30 ml  32.45 ml/m LA Vol (A4C):   65.4 ml 39.08 ml/m LA Biplane Vol: 70.6 ml 42.19 ml/m  AORTIC VALVE                   PULMONIC VALVE AV Area (Vmax):    2.43 cm    PV Vmax:       0.70 m/s AV Area (Vmean):   2.14 cm    PV Vmean:      48.000 cm/s AV Area (VTI):     2.47 cm    PV VTI:        0.143 m AV Vmax:  116.00 cm/s PV Peak grad:  1.9 mmHg AV Vmean:          77.200 cm/s PV Mean grad:  1.0 mmHg AV VTI:            0.236 m AV Peak Grad:      5.4 mmHg AV Mean Grad:      3.0 mmHg LVOT Vmax:         81.40 cm/s LVOT Vmean:        47.800 cm/s LVOT VTI:          0.168 m LVOT/AV VTI ratio: 0.71 AI PHT:            649 msec  AORTA Ao Root diam: 3.10 cm MITRAL VALVE                 TRICUSPID VALVE MV Area (PHT): 2.85 cm      TR Peak grad:   69.2 mmHg MV Area VTI:   1.61 cm      TR Vmax:        416.00 cm/s MV Peak grad:  4.8 mmHg MV Mean grad:  1.0 mmHg      SHUNTS MV Vmax:       1.09 m/s      Systemic VTI:  0.17 m MV Vmean:      54.0 cm/s     Systemic Diam: 2.10 cm MV Decel Time: 266 msec MR Peak grad:    126.8 mmHg MR Mean grad:    79.0 mmHg MR Vmax:         563.00 cm/s MR Vmean:        415.0 cm/s MR PISA:         1.01 cm MR PISA Eff ROA: 7 mm MR PISA Radius:  0.40 cm MV E velocity: 90.00 cm/s MV A velocity: 42.60 cm/s MV E/A ratio:  2.11 Kate Sable MD Electronically signed by Kate Sable MD Signature Date/Time: 04/10/2021/4:04:12 PM    Final      Scheduled Meds:  aspirin EC  81 mg Oral Daily    atorvastatin  40 mg Oral Daily   clopidogrel  75 mg Oral Daily   enoxaparin (LOVENOX) injection  40 mg Subcutaneous Q24H   metoprolol succinate  50 mg Oral Daily   pantoprazole  40 mg Oral Daily   ramipril  5 mg Oral Daily   Continuous Infusions:   LOS: 2 days     Enzo Bi, MD [facility] If 7PM-7AM, please contact night-coverage 04/12/2021, 2:24 AM

## 2021-04-11 NOTE — Progress Notes (Signed)
Daniel Bolton  SUBJECTIVE: Patient sitting on side of the bed, resting comfortably, denies chest pain, less shortness of breath   Vitals:   04/10/21 1935 04/10/21 2329 04/11/21 0400 04/11/21 0721  BP: (!) 162/61 (!) 147/59 (!) 143/53 (!) 126/48  Pulse: 60 63 (!) 58 (!) 55  Resp: '18 19 20 19  '$ Temp: 97.8 F (36.6 C) 98.8 F (37.1 C) 98.3 F (36.8 C) 98.2 F (36.8 C)  TempSrc: Oral Oral Oral Oral  SpO2: 98% 97% 95% 98%  Weight:   59 kg   Height:         Intake/Output Summary (Last 24 hours) at 04/11/2021 0748 Last data filed at 04/11/2021 0449 Gross per 24 hour  Intake 120 ml  Output 3875 ml  Net -3755 ml      PHYSICAL EXAM  General: Well developed, well nourished, in no acute distress HEENT:  Normocephalic and atramatic Neck:  No JVD.  Lungs: Clear bilaterally to auscultation and percussion. Heart: HRRR . Normal S1 and S2 without gallops or murmurs.  Abdomen: Bowel sounds are positive, abdomen soft and non-tender  Msk:  Back normal, normal gait. Normal strength and tone for age. Extremities: No clubbing, cyanosis or edema.   Neuro: Alert and oriented X 3. Psych:  Good affect, responds appropriately   LABS: Basic Metabolic Panel: Recent Labs    04/10/21 0416 04/11/21 0538  NA 115* 124*  K 3.6 2.9*  CL 80* 83*  CO2 29 31  GLUCOSE 111* 98  BUN 32* 33*  CREATININE 1.40* 1.25*  CALCIUM 8.4* 8.5*  MG  --  1.9   Liver Function Tests: No results for input(s): AST, ALT, ALKPHOS, BILITOT, PROT, ALBUMIN in the last 72 hours. No results for input(s): LIPASE, AMYLASE in the last 72 hours. CBC: Recent Labs    04/10/21 0416 04/11/21 0538  WBC 7.5 6.7  HGB 10.0* 10.2*  HCT 28.2* 29.2*  MCV 80.6 80.7  PLT 139* 152   Cardiac Enzymes: No results for input(s): CKTOTAL, CKMB, CKMBINDEX, TROPONINI in the last 72 hours. BNP: Invalid input(s): POCBNP D-Dimer: No results for input(s): DDIMER in the last 72 hours. Hemoglobin A1C: No results for input(s): HGBA1C in  the last 72 hours. Fasting Lipid Panel: No results for input(s): CHOL, HDL, LDLCALC, TRIG, CHOLHDL, LDLDIRECT in the last 72 hours. Thyroid Function Tests: Recent Labs    04/10/21 0416  TSH 1.476   Anemia Panel: No results for input(s): VITAMINB12, FOLATE, FERRITIN, TIBC, IRON, RETICCTPCT in the last 72 hours.  DG Chest 2 View  Result Date: 04/09/2021 CLINICAL DATA:  Cough.  CHF.  Abdominal and extremity swelling. EXAM: CHEST - 2 VIEW COMPARISON:  Most recent radiograph 03/22/2021 FINDINGS: Post median sternotomy and CABG. Unchanged cardiomegaly. Aortic atherosclerosis. Increasing left pleural effusion with stable right pleural effusion. Increasing fluid in the fissures. Increasing pulmonary edema. No pneumothorax. Stable osseous structures. IMPRESSION: CHF. Increasing left pleural effusion and pulmonary edema since prior exam. Electronically Signed   By: Keith Rake M.D.   On: 04/09/2021 23:53   ECHOCARDIOGRAM COMPLETE  Result Date: 04/10/2021    ECHOCARDIOGRAM REPORT   Patient Name:   Daniel Bolton [facility] Date of Exam: 04/10/2021 Medical Rec #:  [id number]           Height:       69.0 in Accession #:    [id number]          Weight:       121.8 lb Date of Birth:  1934-07-16  BSA:          1.673 m Patient Age:    87 years            BP:           156/67 mmHg Patient Gender: M                   HR:           49 bpm. Exam Location:  [facility] Procedure: 2D Echo, Color Doppler, Cardiac Doppler, Strain Analysis and            Intracardiac Opacification Agent Indications:     I50.31 CHF-Acute Diastolic  History:         Patient has no prior history of Echocardiogram examinations.                  Stroke; Risk Factors:Hypertension.  Sonographer:     Charmayne Sheer RDCS (AE) Referring Phys:  [id number] Arvella Merles MANSY Diagnosing Phys: Kate Sable MD  Sonographer Comments: Global longitudinal strain was attempted. IMPRESSIONS  1. Left ventricular ejection fraction, by estimation, is 25 to 30%. The left  ventricle has severely decreased function. The left ventricle demonstrates global hypokinesis. There is mild left ventricular hypertrophy. Left ventricular diastolic parameters  are consistent with Grade II diastolic dysfunction (pseudonormalization). The average left ventricular global longitudinal strain is -9.4 %. The global longitudinal strain is abnormal.  2. Right ventricular systolic function is moderately reduced. The right ventricular size is normal.  3. Left atrial size was mild to moderately dilated.  4. Right atrial size was mildly dilated.  5. The mitral valve is normal in structure. Mild to moderate mitral valve regurgitation.  6. Tricuspid valve regurgitation is mild to moderate.  7. The aortic valve is tricuspid. Aortic valve regurgitation is mild. Mild aortic valve sclerosis is present, with no evidence of aortic valve stenosis.  8. The inferior vena cava is dilated in size with >50% respiratory variability, suggesting right atrial pressure of 8 mmHg. FINDINGS  Left Ventricle: Left ventricular ejection fraction, by estimation, is 25 to 30%. The left ventricle has severely decreased function. The left ventricle demonstrates global hypokinesis. Definity contrast agent was given IV to delineate the left ventricular endocardial borders. The average left ventricular global longitudinal strain is -9.4 %. The global longitudinal strain is abnormal. The left ventricular internal cavity size was normal in size. There is mild left ventricular hypertrophy. Left  ventricular diastolic parameters are consistent with Grade II diastolic dysfunction (pseudonormalization). Right Ventricle: The right ventricular size is normal. No increase in right ventricular wall thickness. Right ventricular systolic function is moderately reduced. Left Atrium: Left atrial size was mild to moderately dilated. Right Atrium: Right atrial size was mildly dilated. Pericardium: There is no evidence of pericardial effusion. Mitral Valve:  The mitral valve is normal in structure. Mild to moderate mitral valve regurgitation. MV peak gradient, 4.8 mmHg. The mean mitral valve gradient is 1.0 mmHg. Tricuspid Valve: The tricuspid valve is normal in structure. Tricuspid valve regurgitation is mild to moderate. Aortic Valve: The aortic valve is tricuspid. Aortic valve regurgitation is mild. Aortic regurgitation PHT measures 649 msec. Mild aortic valve sclerosis is present, with no evidence of aortic valve stenosis. Aortic valve mean gradient measures 3.0 mmHg. Aortic valve peak gradient measures 5.4 mmHg. Aortic valve area, by VTI measures 2.47 cm. Pulmonic Valve: The pulmonic valve was normal in structure. Pulmonic valve regurgitation is mild. Aorta: The aortic root is normal in size and  structure. Venous: The inferior vena cava is dilated in size with greater than 50% respiratory variability, suggesting right atrial pressure of 8 mmHg. IAS/Shunts: No atrial level shunt detected by color flow Doppler.  LEFT VENTRICLE PLAX 2D LVIDd:         4.90 cm      Diastology LVIDs:         4.20 cm      LV e' medial:    3.37 cm/s LV PW:         1.20 cm      LV E/e' medial:  26.7 LV IVS:        1.10 cm      LV e' lateral:   4.68 cm/s LVOT diam:     2.10 cm      LV E/e' lateral: 19.2 LV SV:         58 LV SV Index:   35           2D Longitudinal Strain LVOT Area:     3.46 cm     2D Strain GLS Avg:     -9.4 %  LV Volumes (MOD) LV vol d, MOD A2C: 179.0 ml LV vol d, MOD A4C: 154.0 ml LV vol s, MOD A2C: 126.0 ml LV vol s, MOD A4C: 112.0 ml LV SV MOD A2C:     53.0 ml LV SV MOD A4C:     154.0 ml LV SV MOD BP:      44.8 ml RIGHT VENTRICLE RV Basal diam:  3.90 cm LEFT ATRIUM             Index       RIGHT ATRIUM           Index LA diam:        3.80 cm 2.27 cm/m  RA Area:     18.70 cm LA Vol (A2C):   76.2 ml 45.54 ml/m RA Volume:   54.30 ml  32.45 ml/m LA Vol (A4C):   65.4 ml 39.08 ml/m LA Biplane Vol: 70.6 ml 42.19 ml/m  AORTIC VALVE                   PULMONIC VALVE AV Area  (Vmax):    2.43 cm    PV Vmax:       0.70 m/s AV Area (Vmean):   2.14 cm    PV Vmean:      48.000 cm/s AV Area (VTI):     2.47 cm    PV VTI:        0.143 m AV Vmax:           116.00 cm/s PV Peak grad:  1.9 mmHg AV Vmean:          77.200 cm/s PV Mean grad:  1.0 mmHg AV VTI:            0.236 m AV Peak Grad:      5.4 mmHg AV Mean Grad:      3.0 mmHg LVOT Vmax:         81.40 cm/s LVOT Vmean:        47.800 cm/s LVOT VTI:          0.168 m LVOT/AV VTI ratio: 0.71 AI PHT:            649 msec  AORTA Ao Root diam: 3.10 cm MITRAL VALVE                 TRICUSPID VALVE MV Area (PHT):  2.85 cm      TR Peak grad:   69.2 mmHg MV Area VTI:   1.61 cm      TR Vmax:        416.00 cm/s MV Peak grad:  4.8 mmHg MV Mean grad:  1.0 mmHg      SHUNTS MV Vmax:       1.09 m/s      Systemic VTI:  0.17 m MV Vmean:      54.0 cm/s     Systemic Diam: 2.10 cm MV Decel Time: 266 msec MR Peak grad:    126.8 mmHg MR Mean grad:    79.0 mmHg MR Vmax:         563.00 cm/s MR Vmean:        415.0 cm/s MR PISA:         1.01 cm MR PISA Eff ROA: 7 mm MR PISA Radius:  0.40 cm MV E velocity: 90.00 cm/s MV A velocity: 42.60 cm/s MV E/A ratio:  2.11 Kate Sable MD Electronically signed by Kate Sable MD Signature Date/Time: 04/10/2021/4:04:12 PM    Final       TELEMETRY: Sinus bradycardia 59 bpm:  ASSESSMENT AND PLAN:  Active Problems:   * No active hospital problems. *    1.  Acute diastolic congestive heart failure, BNP 1726.5, chest x-ray revealing CHF with left pleural effusion, clinical improvement noted after initial diuresis 2.  Coronary artery disease, status post CABG x3, no chest pain, normal high-sensitivity troponin ( 15,15 ), nondiagnostic ECG 3.  Essential hypertension, blood pressure stable on metoprolol succinate and furosemide 4.  Chronic kidney disease, BUN and creatinine 33 and 1.25, respectively 5.  Hyponatremia, sodium 124, improved after initial diuresis, patient may have been on both furosemide and HCTZ as  outpatient, sodium 135 on 03/25/2021 prior to starting HCTZ 6.  Chronic anemia   Recommendations   1.  Agree with overall current therapy 2.  Continue diuresis 3.  Carefully monitor renal status as well as sodium level 4.  Defer further cardiac diagnostics at this time   Isaias Cowman, MD, PhD, [facility] 04/11/2021 7:48 AM

## 2021-04-11 NOTE — Progress Notes (Signed)
   Heart Failure Nurse Navigator Note  Secure chatted with the dietitian to ask her to aid in educating his daughters on low-sodium diet.  Also have asked his nurse today to show him heart failure videos in [place].      Pricilla Riffle RN CHFN

## 2021-04-12 DIAGNOSIS — E43 Unspecified severe protein-calorie malnutrition: Secondary | ICD-10-CM

## 2021-04-12 LAB — MAGNESIUM: Magnesium: 1.7 mg/dL (ref 1.7–2.4)

## 2021-04-12 LAB — BASIC METABOLIC PANEL
Anion gap: 9 (ref 5–15)
BUN: 39 mg/dL — ABNORMAL HIGH (ref 8–23)
CO2: 29 mmol/L (ref 22–32)
Calcium: 8.2 mg/dL — ABNORMAL LOW (ref 8.9–10.3)
Chloride: 88 mmol/L — ABNORMAL LOW (ref 98–111)
Creatinine, Ser: 1.38 mg/dL — ABNORMAL HIGH (ref 0.61–1.24)
GFR, Estimated: 50 mL/min — ABNORMAL LOW (ref >60)
Glucose, Bld: 85 mg/dL (ref 70–99)
Potassium: 3.7 mmol/L (ref 3.5–5.1)
Sodium: 126 mmol/L — ABNORMAL LOW (ref 135–145)

## 2021-04-12 LAB — CBC
HCT: 27.9 % — ABNORMAL LOW (ref 39.0–52.0)
Hemoglobin: 9.7 g/dL — ABNORMAL LOW (ref 13.0–17.0)
MCH: 28.4 pg (ref 26.0–34.0)
MCHC: 34.8 g/dL (ref 30.0–36.0)
MCV: 81.8 fL (ref 80.0–100.0)
Platelets: 151 10*3/uL (ref 150–400)
RBC: 3.41 MIL/uL — ABNORMAL LOW (ref 4.22–5.81)
RDW: 13.6 % (ref 11.5–15.5)
WBC: 6.3 10*3/uL (ref 4.0–10.5)
nRBC: 0 % (ref 0.0–0.2)

## 2021-04-12 MED ORDER — ENSURE ENLIVE PO LIQD
237.0000 mL | Freq: Two times a day (BID) | ORAL | Status: DC
  Administered 2021-04-12 – 2021-04-14 (×4): 237 mL via ORAL

## 2021-04-12 MED ORDER — FUROSEMIDE 10 MG/ML IJ SOLN
40.0000 mg | Freq: Two times a day (BID) | INTRAMUSCULAR | Status: AC
  Administered 2021-04-12 (×2): 40 mg via INTRAVENOUS
  Filled 2021-04-12 (×2): qty 4

## 2021-04-12 MED ORDER — POLYETHYLENE GLYCOL 3350 17 G PO PACK
17.0000 g | PACK | Freq: Two times a day (BID) | ORAL | Status: DC | PRN
  Filled 2021-04-12: qty 1

## 2021-04-12 MED ORDER — GUAIFENESIN-DM 100-10 MG/5ML PO SYRP
5.0000 mL | ORAL_SOLUTION | ORAL | Status: DC | PRN
  Administered 2021-04-12: 5 mL via ORAL
  Filled 2021-04-12: qty 5

## 2021-04-12 MED ORDER — DOCUSATE SODIUM 100 MG PO CAPS
100.0000 mg | ORAL_CAPSULE | Freq: Two times a day (BID) | ORAL | Status: DC | PRN
  Administered 2021-04-12: 100 mg via ORAL
  Filled 2021-04-12 (×2): qty 1

## 2021-04-12 NOTE — Plan of Care (Signed)
Nutrition Education Note  RD consulted for nutrition education regarding CHF.  87 y.o. hispanic male with medical history significant for hypertension, CHF with previous EF of 50 to 55%, valvular regurgitations and CVA who is admitted with CHF and hyponatremia.  Met with pt in room today along with interpreter Ronnald Collum.   RD provided spanish version of "Low Sodium Nutrition Therapy" handout from the Academy of [employer] and [employer]etics. Reviewed patient's dietary recall. Provided examples on ways to decrease sodium intake in diet. Discouraged intake of processed foods and use of salt shaker. Encouraged fresh fruits and vegetables as well as whole grain sources of carbohydrates to maximize fiber intake.   RD discussed why it is important for patient to adhere to diet recommendations, and emphasized the role of fluids, foods to avoid, and importance of weighing self daily. Teach back method used.  Expect good compliance.  Body mass index is 22.48 kg/m. Pt meets criteria for normal weight based on current BMI.  RD following this pt  Koleen Distance MS, RD, LDN Please refer to [facility] for RD and/or RD on-call/weekend/after hours pager

## 2021-04-12 NOTE — Plan of Care (Signed)

## 2021-04-12 NOTE — TOC Initial Note (Signed)
Transition of Care [facility]) - Initial/Assessment Note    Patient Details  Name: Daniel Bolton MRN: [id number] Date of Birth: Mar 10, 1934  [facility] CM/SW Contact:    Candie Chroman, LCSW Phone Number: 04/12/2021, 11:53 AM  Clinical Narrative:  Unable to locate interpreter tablet. Spanish-speaking nursing student provided interpretation services. Readmission prevention screen complete. CSW met with patient. No supports at bedside. CSW introduced role and explained that discharge planning would be discussed. PCP is [name], MD. Patient does not drive but has transportation to appointments. Pharmacy is [facility] in [place]. No issues obtaining medications other than sometimes forgetting to call in refills. No home health prior to admission but patient inquired about a HHRN to check his vitals and someone to help him get dressed. Sent secure chat to MD requesting PT and OT consults. Patient uses a single-point cane at home. No oxygen use at home. Currently on 2 L. Patient has a scale at home and knows the importance of weighing daily. No further concerns. CSW encouraged patient to contact CSW as needed. CSW will continue to follow patient for support and facilitate return home when stable.                Expected Discharge Plan: [place] Barriers to Discharge: Continued Medical Work up   Patient Goals and CMS Choice        Expected Discharge Plan and Services Expected Discharge Plan: [place] Choice: [place] arrangements for the past 2 months: Single Family Home                                      Prior Living Arrangements/Services Living arrangements for the past 2 months: Single Family Home   Patient language and need for interpreter reviewed:: Yes (Spanish) Do you feel safe going back to the place where you live?: Yes      Need for Family Participation in Patient Care: Yes  (Comment)   Current home services: DME Criminal Activity/Legal Involvement Pertinent to Current Situation/Hospitalization: No - Comment as needed  Activities of Daily Living Home Assistive Devices/Equipment: None ADL Screening (condition at time of admission) Patient's cognitive ability adequate to safely complete daily activities?: Yes Is the patient deaf or have difficulty hearing?: No Does the patient have difficulty seeing, even when wearing glasses/contacts?: No Does the patient have difficulty concentrating, remembering, or making decisions?: No Patient able to express need for assistance with ADLs?: Yes Does the patient have difficulty dressing or bathing?: Yes Independently performs ADLs?: Yes (appropriate for developmental age) Does the patient have difficulty walking or climbing stairs?: Yes Weakness of Legs: Both Weakness of Arms/Hands: None  Permission Sought/Granted                  Emotional Assessment Appearance:: Appears stated age Attitude/Demeanor/Rapport: Engaged, Gracious Affect (typically observed): Accepting, Appropriate, Calm, Pleasant Orientation: : Oriented to Self, Oriented to Place, Oriented to  Time, Oriented to Situation Alcohol / Substance Use: Not Applicable Psych Involvement: No (comment)  Admission diagnosis:  Hyponatremia [E87.1] Acute CHF (congestive heart failure) (HCC) [I50.9] Acute on chronic diastolic congestive heart failure (HCC) [I50.33] Patient Active Problem List   Diagnosis Date Noted   Essential hypertension 03/22/2021   Acute on chronic diastolic CHF (congestive heart failure) ([place]) 03/22/2021   Hyponatremia 03/22/2021   Acute  on chronic systolic CHF (congestive heart failure) ([place]) 03/11/2021   Hypertensive urgency 03/11/2021   CKD (chronic kidney disease), stage IV (HCC) 03/11/2021   Normochromic normocytic anemia 03/11/2021   Acute CHF (congestive heart failure) ([place]) 03/11/2021   Moderate major depression, single  episode ([place]) 08/01/2015   Coronary artery disease 08/01/2015   Financial problems 08/01/2015   PCP:  [name], MD Pharmacy:   [id number] [place] [id number]: [phone]: [phone]    Social Determinants of Health (SDOH) Interventions    Readmission Risk Interventions No flowsheet data found.

## 2021-04-12 NOTE — Evaluation (Addendum)
Physical Therapy Evaluation Patient Details Name: Daniel Bolton MRN: [id number] DOB: [birth year] Today's Date: 04/12/2021   History of Present Illness  Pt is a 87 y.o. M with a medical history significant for HTN, CHF, CVA who arrived to ED for bilateral LE edema, admitted for CHF and hypoatremia. No PMH listed in chart.  Clinical Impression  Pt alert, cooperative with therapy. Spanish interpreter utilized during session. PLOF includes ambulation with a SPC for household mobility, does not drive or cook, daughters drive him to appointments and bring meals during the day. Ambulated 150 ft with RW, some feet with singlue UE support, min-guard for safety; pt ambulated 50 ft without an assistive device, min-guard with good stability. Skilled PT intervention is indicated to address deficits in function, mobility, and to return to PLOF as able. Discharge recommendations are home with PT.    Follow Up Recommendations Home health PT    Equipment Recommendations  Rolling walker with 5" wheels    Recommendations for Other Services       Precautions / Restrictions Precautions Precautions: Fall Restrictions Weight Bearing Restrictions: No      Mobility  Bed Mobility Overal bed mobility: Modified Independent             General bed mobility comments: HOB elevated ~ 45 degrees    Transfers Overall transfer level: Modified independent Equipment used: Rolling walker (2 wheeled)             General transfer comment: Supervision for safety, utilized RW during sit<>stand w/ lowered bed  Ambulation/Gait Ambulation/Gait assistance: Min guard Gait Distance (Feet): 200 Feet Assistive device: Rolling walker (2 wheeled);None Gait Pattern/deviations: Decreased stride length Gait velocity: Decreased   General Gait Details: Amb 150 ft w/ RW, 50 ft w/o AD with good stability, decreased reciprocal arm swing  Stairs            Wheelchair Mobility    Modified Rankin (Stroke  Patients Only)       Balance Overall balance assessment: Needs assistance Sitting-balance support: Feet supported;No upper extremity supported Sitting balance-Leahy Scale: Good       Standing balance-Leahy Scale: Good Standing balance comment: Able to stand, pivot w/o LOB                             Pertinent Vitals/Pain Pain Assessment: No/denies pain    Home Living Family/patient expects to be discharged to:: Private residence Living Arrangements: Children Available Help at Discharge: Family Type of Home: Mobile home Home Access: Stairs to enter Entrance Stairs-Rails: None Entrance Stairs-Number of Steps: 5 Home Layout: One level Home Equipment: Cane - single point      Prior Function Level of Independence: Needs assistance   Gait / Transfers Assistance Needed: SPC for household amb  ADL's / Homemaking Assistance Needed: Pt does not drive or cook, daughters drive to appointments and bring meals        Hand Dominance        Extremity/Trunk Assessment   Upper Extremity Assessment Upper Extremity Assessment: Overall WFL for tasks assessed    Lower Extremity Assessment Lower Extremity Assessment: Overall WFL for tasks assessed (5/5 MMT BLE hip flexion, knee extension, dorsiflexion)       Communication   Communication: No difficulties;Interpreter utilized (Spanish speaking)  Cognition Arousal/Alertness: Awake/alert Behavior During Therapy: WFL for tasks assessed/performed Overall Cognitive Status: Within Functional Limits for tasks assessed  General Comments: AO to name, DOB      General Comments      Exercises     Assessment/Plan    PT Assessment Patient needs continued PT services  PT Problem List Decreased strength;Decreased activity tolerance       PT Treatment Interventions Gait training;Stair training;Functional mobility training;Therapeutic activities;Therapeutic  exercise;Neuromuscular re-education;Balance training    PT Goals (Current goals can be found in the Care Plan section)  Acute Rehab PT Goals Patient Stated Goal: Go home PT Goal Formulation: With patient Time For Goal Achievement: 04/26/21 Potential to Achieve Goals: Good    Frequency Min 2X/week   Barriers to discharge        Co-evaluation               AM-PAC PT "6 Clicks" Mobility  Outcome Measure Help needed turning from your back to your side while in a flat bed without using bedrails?: A Little Help needed moving from lying on your back to sitting on the side of a flat bed without using bedrails?: A Little Help needed moving to and from a bed to a chair (including a wheelchair)?: None Help needed standing up from a chair using your arms (e.g., wheelchair or bedside chair)?: None Help needed to walk in hospital room?: A Little Help needed climbing 3-5 steps with a railing? : A Little 6 Click Score: 20    End of Session Equipment Utilized During Treatment: Gait belt Activity Tolerance: Patient tolerated treatment well Patient left: in chair;with call bell/phone within reach;with chair alarm set Nurse Communication: Mobility status PT Visit Diagnosis: Muscle weakness (generalized) (M62.81)    Time: [id number] PT Time Calculation (min) (ACUTE ONLY): 43 min   Charges:              [employer], SPT

## 2021-04-12 NOTE — Progress Notes (Addendum)
PROGRESS NOTE    Daniel Bolton  [id number] DOB: 03/05/1934 DOA: 04/10/2021 PCP: [name], MD  [id number]   Assessment & Plan:   Active Problems:   Protein-calorie malnutrition, severe   Daniel Bolton is a 87 y.o. Hispanic male with medical history significant for hypertension, CHF with previous EF of 50 to 55%, valvular regurgitations and CVA, who presented to the emergency room with acute onset of` bilateral lower extremity edema as well as associated dyspnea and cough.  He was recently admitted for acute CHF and hyponatremia.  For the last 1 to 2 weeks his lower extremity edema has been worsening.  He has been experiencing orthopnea and mild productive cough.   1.  Acute on chronic combined CHF  Most recent EF was 50 to 55% in April/2022, however, EF worsened to 25-30% during this hospitalization. --start on IV lasix 40 mg BID and diuresing well. Plan: --cont IV lasix 40 mg BID today --cont ramipril --Hold home Toprol due to low HR  Hyponatremia, hypervolemia --Na 113 on presentation, likely due to hypervolemia. --Na improving with diuresis. Plan: --cont IV lasix 40 mg BID today --cont to hold home HCTZ and torsemide  2.  Essential hypertension. --cont IV lasix 40 mg BID today --cont ramipril --Hold home Toprol due to low HR --home torsemide and HCTZ on hold while on IV lasix  3.  Dyslipidemia. --cont statin  4.  Coronary artery disease status post CABG. --cont ASA and plavix  5.  GERD. --cont PPI  Hypokalemia --from aggressive diuresis --monitor and replete with oral potassium  Severe malnutrition --related to chronic illness --Nutrition consulted and supplements ordered   DVT prophylaxis: Lovenox SQ Code Status: Full code  Family Communication:  Level of care: Progressive Cardiac Dispo:   The patient is from: home Anticipated d/c is to: home Anticipated d/c date is: 1-2 days Patient currently is not medically ready to d/c due  to: on IV lasix, hyponatremia still severe   Subjective and Interval History:  Pt reported feeling better.  Pt said 1 of his home meds was making him dizzy, but since coming to the hospital, he has not felt dizzy.  Said that he makes urine in small amounts.   Objective: Vitals:   04/12/21 0505 04/12/21 0727 04/12/21 1136 04/12/21 1541  BP: (!) 142/53 126/60 (!) 153/63 (!) 161/61  Pulse: (!) 51 (!) 51 (!) 57 (!) 55  Resp: '17 18 20 20  '$ Temp: (!) 97.5 F (36.4 C) 97.8 F (36.6 C) (!) 97.4 F (36.3 C) 97.8 F (36.6 C)  TempSrc:  Oral Oral Oral  SpO2: 96% 98% 98% 100%  Weight: 57.6 kg     Height:        Intake/Output Summary (Last 24 hours) at 04/12/2021 1723 Last data filed at 04/12/2021 1541 Gross per 24 hour  Intake 960 ml  Output 1625 ml  Net -665 ml   Filed Weights   04/10/21 1226 04/11/21 0400 04/12/21 0505  Weight: 59.5 kg 59 kg 57.6 kg    Examination:   Constitutional: NAD, AAOx3, sitting at side of bed HEENT: conjunctivae and lids normal, EOMI CV: No cyanosis.   RESP: normal respiratory effort SKIN: warm, dry Neuro: II - XII grossly intact.   Psych: Normal mood and affect.     Data Reviewed: I have personally reviewed following labs and imaging studies  CBC: Recent Labs  Lab 04/09/21 2340 04/10/21 0416 04/11/21 0538 04/12/21 0458  WBC 8.6 7.5 6.7 6.3  HGB 10.1*  10.0* 10.2* 9.7*  HCT 28.7* 28.2* 29.2* 27.9*  MCV 81.3 80.6 80.7 81.8  PLT 147* 139* 152 [id number]   Basic Metabolic Panel: Recent Labs  Lab 04/09/21 2340 04/10/21 0416 04/11/21 0538 04/12/21 0458  NA 113* 115* 124* 126*  K 4.0 3.6 2.9* 3.7  CL 81* 80* 83* 88*  CO2 '25 29 31 29  '$ GLUCOSE 99 111* 98 85  BUN 31* 32* 33* 39*  CREATININE 1.45* 1.40* 1.25* 1.38*  CALCIUM 8.2* 8.4* 8.5* 8.2*  MG  --   --  1.9 1.7   GFR: Estimated Creatinine Clearance: 31.5 mL/min (A) (by C-G formula based on SCr of 1.38 mg/dL (H)). Liver Function Tests: No results for input(s): AST, ALT, ALKPHOS, BILITOT,  PROT, ALBUMIN in the last 168 hours. No results for input(s): LIPASE, AMYLASE in the last 168 hours. No results for input(s): AMMONIA in the last 168 hours. Coagulation Profile: No results for input(s): INR, PROTIME in the last 168 hours. Cardiac Enzymes: No results for input(s): CKTOTAL, CKMB, CKMBINDEX, TROPONINI in the last 168 hours. BNP (last 3 results) No results for input(s): PROBNP in the last 8760 hours. HbA1C: No results for input(s): HGBA1C in the last 72 hours. CBG: No results for input(s): GLUCAP in the last 168 hours. Lipid Profile: No results for input(s): CHOL, HDL, LDLCALC, TRIG, CHOLHDL, LDLDIRECT in the last 72 hours. Thyroid Function Tests: Recent Labs    04/10/21 0416  TSH 1.476   Anemia Panel: No results for input(s): VITAMINB12, FOLATE, FERRITIN, TIBC, IRON, RETICCTPCT in the last 72 hours. Sepsis Labs: No results for input(s): PROCALCITON, LATICACIDVEN in the last 168 hours.  Recent Results (from the past 240 hour(s))  Resp Panel by RT-PCR (Flu A&B, Covid) Nasopharyngeal Swab     Status: None   Collection Time: 04/10/21  1:11 AM   Specimen: Nasopharyngeal Swab; Nasopharyngeal(NP) swabs in vial transport medium  Result Value Ref Range Status   SARS Coronavirus 2 by RT PCR NEGATIVE NEGATIVE Final    Comment: (NOTE) SARS-CoV-2 target nucleic acids are NOT DETECTED.  The SARS-CoV-2 RNA is generally detectable in upper respiratory specimens during the acute phase of infection. The lowest concentration of SARS-CoV-2 viral copies this assay can detect is 138 copies/mL. A negative result does not preclude SARS-Cov-2 infection and should not be used as the sole basis for treatment or other patient management decisions. A negative result may occur with  improper specimen collection/handling, submission of specimen other than nasopharyngeal swab, presence of viral mutation(s) within the areas targeted by this assay, and inadequate number of viral copies(<138  copies/mL). A negative result must be combined with clinical observations, patient history, and epidemiological information. The expected result is Negative.  Fact Sheet for Patients:  EntrepreneurPulse.com.au  Fact Sheet for Healthcare Providers:  IncredibleEmployment.be  This test is no t yet approved or cleared by the [place] FDA and  has been authorized for detection and/or diagnosis of SARS-CoV-2 by FDA under an Emergency Use Authorization (EUA). This EUA will remain  in effect (meaning this test can be used) for the duration of the COVID-19 declaration under Section 564(b)(1) of the Act, 21 U.S.C.section 360bbb-3(b)(1), unless the authorization is terminated  or revoked sooner.       Influenza A by PCR NEGATIVE NEGATIVE Final   Influenza B by PCR NEGATIVE NEGATIVE Final    Comment: (NOTE) The Xpert Xpress SARS-CoV-2/FLU/RSV plus assay is intended as an aid in the diagnosis of influenza from Nasopharyngeal swab specimens and should not  be used as a sole basis for treatment. Nasal washings and aspirates are unacceptable for Xpert Xpress SARS-CoV-2/FLU/RSV testing.  Fact Sheet for Patients: EntrepreneurPulse.com.au  Fact Sheet for Healthcare Providers: IncredibleEmployment.be  This test is not yet approved or cleared by the [place] FDA and has been authorized for detection and/or diagnosis of SARS-CoV-2 by FDA under an Emergency Use Authorization (EUA). This EUA will remain in effect (meaning this test can be used) for the duration of the COVID-19 declaration under Section 564(b)(1) of the Act, 21 U.S.C. section 360bbb-3(b)(1), unless the authorization is terminated or revoked.  Performed at [facility], [address] [place], [place] [address]       Radiology Studies: No results found.   Scheduled Meds:  aspirin EC  81 mg Oral Daily   atorvastatin  40 mg Oral Daily    clopidogrel  75 mg Oral Daily   enoxaparin (LOVENOX) injection  40 mg Subcutaneous Q24H   feeding supplement  237 mL Oral BID BM   pantoprazole  40 mg Oral Daily   ramipril  5 mg Oral Daily   Continuous Infusions:   LOS: 2 days     Enzo Bi, MD [facility] If 7PM-7AM, please contact night-coverage 04/12/2021, 5:23 PM

## 2021-04-12 NOTE — Progress Notes (Signed)
Initial Nutrition Assessment  DOCUMENTATION CODES:   Severe malnutrition in context of chronic illness  INTERVENTION:   Ensure Enlive po BID, each supplement provides 350 kcal and 20 grams of protein  Low sodium diet education   Liberalize diet   NUTRITION DIAGNOSIS:   Severe Malnutrition related to chronic illness (CHF) as evidenced by moderate fat depletion, severe fat depletion, moderate muscle depletion, severe muscle depletion.  GOAL:   Patient will meet greater than or equal to 90% of their needs  MONITOR:   PO intake, Supplement acceptance, Labs, Weight trends, Skin, I & O's  REASON FOR ASSESSMENT:   Consult Diet education  ASSESSMENT:   87 y.o. hispanic male with medical history significant for hypertension, CHF with previous EF of 50 to 55%, valvular regurgitations and CVA who is admitted with CHF and hyponatremia.  Met with pt in room today along with interpreter Ronnald Collum. Pt reports good appetite and oral intake at baseline. Pt reports eating 100% of his meals in hospital; pt is documented to be eating 90-100% of meals. Pt reports a typical day for him would include oatmeal and toast for breakfast, soup and salad for lunch and soup and grilled chicken for dinner. Pt reports that the majority of his meals are prepared by his daughters. Pt provided with low sodium diet education today. Pt reports that he does not add salt to anything. Pt reports that he drinks mainly water and juice. Pt reports that he does not eat out at restaurants. RD attempted to reach out to pt's daughter Asencion Partridge) to provide additional low sodium diet education but was unable to reach her. RD also discussed with pt the importance of adequate intake needed to preserve lean muscle. Pt reports that he is willing to drink strawberry supplements in hospital; RD will order. Pt reports that he weighs every morning and that he is weight stable at baseline.   Medications reviewed and include: aspirin, plavix,  lovenox, lasix, protonix  Labs reviewed: Na 126(L), K 3.7 wnl, BUN 39(H), creat 1.38(H), Mg 1.7 wnl Hgb 9.7(L), Hct 27.9(L)  NUTRITION - FOCUSED PHYSICAL EXAM:  Flowsheet Row Most Recent Value  Orbital Region Moderate depletion  Upper Arm Region Severe depletion  Thoracic and Lumbar Region Severe depletion  Buccal Region Moderate depletion  Temple Region Moderate depletion  Clavicle Bone Region Severe depletion  Clavicle and Acromion Bone Region Severe depletion  Scapular Bone Region Moderate depletion  Dorsal Hand Severe depletion  Patellar Region Severe depletion  Anterior Thigh Region Severe depletion  Posterior Calf Region Severe depletion  Edema (RD Assessment) Mild  Hair Reviewed  Eyes Reviewed  Mouth Reviewed  Skin Reviewed  Nails Reviewed      Diet Order:   Diet Order             Diet 2 gram sodium Room service appropriate? Yes; Fluid consistency: Thin  Diet effective now                  EDUCATION NEEDS:   Education needs have been addressed  Skin:  Skin Assessment: Reviewed RN Assessment  Last BM:  7/5  Height:   Ht Readings from Last 1 Encounters:  04/10/21 $RemoveB'5\' 3"'rMthUGeC$  (1.6 m)    Weight:   Wt Readings from Last 1 Encounters:  04/12/21 57.6 kg    Ideal Body Weight:  56.3 kg  BMI:  Body mass index is 22.48 kg/m.  Estimated Nutritional Needs:   Kcal:  1600-1800kcal/day  Protein:  80-90g/day  Fluid:  1.4-1.6L/day  Koleen Distance MS, RD, LDN Please refer to [facility] for RD and/or RD on-call/weekend/after hours pager

## 2021-04-13 DIAGNOSIS — I5041 Acute combined systolic (congestive) and diastolic (congestive) heart failure: Secondary | ICD-10-CM

## 2021-04-13 LAB — BASIC METABOLIC PANEL
Anion gap: 7 (ref 5–15)
BUN: 46 mg/dL — ABNORMAL HIGH (ref 8–23)
CO2: 30 mmol/L (ref 22–32)
Calcium: 8.4 mg/dL — ABNORMAL LOW (ref 8.9–10.3)
Chloride: 88 mmol/L — ABNORMAL LOW (ref 98–111)
Creatinine, Ser: 1.33 mg/dL — ABNORMAL HIGH (ref 0.61–1.24)
GFR, Estimated: 52 mL/min — ABNORMAL LOW (ref >60)
Glucose, Bld: 101 mg/dL — ABNORMAL HIGH (ref 70–99)
Potassium: 4 mmol/L (ref 3.5–5.1)
Sodium: 125 mmol/L — ABNORMAL LOW (ref 135–145)

## 2021-04-13 LAB — CBC
HCT: 28.6 % — ABNORMAL LOW (ref 39.0–52.0)
Hemoglobin: 9.9 g/dL — ABNORMAL LOW (ref 13.0–17.0)
MCH: 28.1 pg (ref 26.0–34.0)
MCHC: 34.6 g/dL (ref 30.0–36.0)
MCV: 81.3 fL (ref 80.0–100.0)
Platelets: 170 10*3/uL (ref 150–400)
RBC: 3.52 MIL/uL — ABNORMAL LOW (ref 4.22–5.81)
RDW: 13.8 % (ref 11.5–15.5)
WBC: 6.5 10*3/uL (ref 4.0–10.5)
nRBC: 0 % (ref 0.0–0.2)

## 2021-04-13 LAB — MAGNESIUM: Magnesium: 2 mg/dL (ref 1.7–2.4)

## 2021-04-13 MED ORDER — FUROSEMIDE 10 MG/ML IJ SOLN
40.0000 mg | Freq: Once | INTRAMUSCULAR | Status: AC
  Administered 2021-04-13: 40 mg via INTRAVENOUS
  Filled 2021-04-13: qty 4

## 2021-04-13 NOTE — Progress Notes (Signed)
[facility] Encounter Note  Patient: Daniel Bolton [facility] / Admit Date: 04/10/2021 / Date of Encounter: 04/13/2021, 8:17 AM   Subjective: Patient has done well overnight with no evidence of significant new symptoms or concerns.  Patient has had low troponin more consistent with demand ischemia and no current evidence of acute coronary syndrome.  Urine output has been more than 6 L and the patient has had stability through the last 24 hours.  Blood pressure and heart rate have been more stable at this time.  The patient appears to have been significantly improved and potentially closer for discharge to home.  Review of Systems: Positive for: None Negative for: Vision change, hearing change, syncope, dizziness, nausea, vomiting,diarrhea, bloody stool, stomach pain, cough, congestion, diaphoresis, urinary frequency, urinary pain,skin lesions, skin rashes Others previously listed  Objective: Telemetry: Normal sinus rhythm Physical Exam: Blood pressure (!) 127/51, pulse (!) 54, temperature 98 F (36.7 C), resp. rate 16, height '5\' 3"'$  (1.6 m), weight 57.4 kg, SpO2 92 %. Body mass index is 22.41 kg/m. General: Well developed, well nourished, in no acute distress. Head: Normocephalic, atraumatic, sclera non-icteric, no xanthomas, nares are without discharge. Neck: No apparent masses Lungs: Normal respirations with no wheezes, no rhonchi, few rales , no crackles   Heart: Regular rate and rhythm, normal S1 S2, no murmur, no rub, no gallop, PMI is normal size and placement, carotid upstroke normal without bruit, jugular venous pressure normal Abdomen: Soft, non-tender, non-distended with normoactive bowel sounds. No hepatosplenomegaly. Abdominal aorta is normal size without bruit Extremities: Trace edema, no clubbing, no cyanosis, no ulcers,  Peripheral: 2+ radial, 2+ femoral, 2+ dorsal pedal pulses Neuro: Alert and oriented. Moves all extremities spontaneously. Psych:  Responds to  questions appropriately with a normal affect.   Intake/Output Summary (Last 24 hours) at 04/13/2021 0817 Last data filed at 04/13/2021 0356 Gross per 24 hour  Intake 960 ml  Output 2200 ml  Net -1240 ml    Inpatient Medications:  . aspirin EC  81 mg Oral Daily  . atorvastatin  40 mg Oral Daily  . clopidogrel  75 mg Oral Daily  . enoxaparin (LOVENOX) injection  40 mg Subcutaneous Q24H  . feeding supplement  237 mL Oral BID BM  . pantoprazole  40 mg Oral Daily  . ramipril  5 mg Oral Daily   Infusions:   Labs: Recent Labs    04/12/21 0458 04/13/21 0443  NA 126* 125*  K 3.7 4.0  CL 88* 88*  CO2 29 30  GLUCOSE 85 101*  BUN 39* 46*  CREATININE 1.38* 1.33*  CALCIUM 8.2* 8.4*  MG 1.7 2.0   No results for input(s): AST, ALT, ALKPHOS, BILITOT, PROT, ALBUMIN in the last 72 hours. Recent Labs    04/12/21 0458 04/13/21 0443  WBC 6.3 6.5  HGB 9.7* 9.9*  HCT 27.9* 28.6*  MCV 81.8 81.3  PLT 151 170   No results for input(s): CKTOTAL, CKMB, TROPONINI in the last 72 hours. Invalid input(s): POCBNP No results for input(s): HGBA1C in the last 72 hours.   Weights: Filed Weights   04/11/21 0400 04/12/21 0505 04/13/21 0357  Weight: 59 kg 57.6 kg 57.4 kg     Radiology/Studies:  DG Chest 2 View  Result Date: 04/09/2021 CLINICAL DATA:  Cough.  CHF.  Abdominal and extremity swelling. EXAM: CHEST - 2 VIEW COMPARISON:  Most recent radiograph 03/22/2021 FINDINGS: Post median sternotomy and CABG. Unchanged cardiomegaly. Aortic atherosclerosis. Increasing left pleural effusion with stable right  pleural effusion. Increasing fluid in the fissures. Increasing pulmonary edema. No pneumothorax. Stable osseous structures. IMPRESSION: CHF. Increasing left pleural effusion and pulmonary edema since prior exam. Electronically Signed   By: Keith Rake M.D.   On: 04/09/2021 23:53   DG Chest 2 View  Result Date: 03/22/2021 CLINICAL DATA:  Shortness of breath and cough for 1 month. EXAM: CHEST -  2 VIEW COMPARISON:  03/10/2021 FINDINGS: Postoperative changes in the mediastinum. Cardiac enlargement with diffuse bilateral airspace and interstitial infiltrates, likely edema. Small bilateral pleural effusions. Similar appearance to previous study. Calcification of the aorta. No pneumothorax. IMPRESSION: Cardiac enlargement with bilateral pulmonary infiltrates and small effusions. Electronically Signed   By: Lucienne Capers M.D.   On: 03/22/2021 00:18   ECHOCARDIOGRAM COMPLETE  Result Date: 04/10/2021    ECHOCARDIOGRAM REPORT   Patient Name:   Daniel Bolton [facility] Date of Exam: 04/10/2021 Medical Rec #:  [id number]           Height:       69.0 in Accession #:    [id number]          Weight:       121.8 lb Date of Birth:  06-27-1934           BSA:          1.673 m Patient Age:    87 years            BP:           156/67 mmHg Patient Gender: M                   HR:           49 bpm. Exam Location:  [facility] Procedure: 2D Echo, Color Doppler, Cardiac Doppler, Strain Analysis and            Intracardiac Opacification Agent Indications:     I50.31 CHF-Acute Diastolic  History:         Patient has no prior history of Echocardiogram examinations.                  Stroke; Risk Factors:Hypertension.  Sonographer:     Charmayne Sheer RDCS (AE) Referring Phys:  [id number] Arvella Merles MANSY Diagnosing Phys: Kate Sable MD  Sonographer Comments: Global longitudinal strain was attempted. IMPRESSIONS  1. Left ventricular ejection fraction, by estimation, is 25 to 30%. The left ventricle has severely decreased function. The left ventricle demonstrates global hypokinesis. There is mild left ventricular hypertrophy. Left ventricular diastolic parameters  are consistent with Grade II diastolic dysfunction (pseudonormalization). The average left ventricular global longitudinal strain is -9.4 %. The global longitudinal strain is abnormal.  2. Right ventricular systolic function is moderately reduced. The right ventricular size is normal.  3. Left  atrial size was mild to moderately dilated.  4. Right atrial size was mildly dilated.  5. The mitral valve is normal in structure. Mild to moderate mitral valve regurgitation.  6. Tricuspid valve regurgitation is mild to moderate.  7. The aortic valve is tricuspid. Aortic valve regurgitation is mild. Mild aortic valve sclerosis is present, with no evidence of aortic valve stenosis.  8. The inferior vena cava is dilated in size with >50% respiratory variability, suggesting right atrial pressure of 8 mmHg. FINDINGS  Left Ventricle: Left ventricular ejection fraction, by estimation, is 25 to 30%. The left ventricle has severely decreased function. The left ventricle demonstrates global hypokinesis. Definity contrast agent was given IV to delineate the left ventricular  endocardial borders. The average left ventricular global longitudinal strain is -9.4 %. The global longitudinal strain is abnormal. The left ventricular internal cavity size was normal in size. There is mild left ventricular hypertrophy. Left  ventricular diastolic parameters are consistent with Grade II diastolic dysfunction (pseudonormalization). Right Ventricle: The right ventricular size is normal. No increase in right ventricular wall thickness. Right ventricular systolic function is moderately reduced. Left Atrium: Left atrial size was mild to moderately dilated. Right Atrium: Right atrial size was mildly dilated. Pericardium: There is no evidence of pericardial effusion. Mitral Valve: The mitral valve is normal in structure. Mild to moderate mitral valve regurgitation. MV peak gradient, 4.8 mmHg. The mean mitral valve gradient is 1.0 mmHg. Tricuspid Valve: The tricuspid valve is normal in structure. Tricuspid valve regurgitation is mild to moderate. Aortic Valve: The aortic valve is tricuspid. Aortic valve regurgitation is mild. Aortic regurgitation PHT measures 649 msec. Mild aortic valve sclerosis is present, with no evidence of aortic valve  stenosis. Aortic valve mean gradient measures 3.0 mmHg. Aortic valve peak gradient measures 5.4 mmHg. Aortic valve area, by VTI measures 2.47 cm. Pulmonic Valve: The pulmonic valve was normal in structure. Pulmonic valve regurgitation is mild. Aorta: The aortic root is normal in size and structure. Venous: The inferior vena cava is dilated in size with greater than 50% respiratory variability, suggesting right atrial pressure of 8 mmHg. IAS/Shunts: No atrial level shunt detected by color flow Doppler.  LEFT VENTRICLE PLAX 2D LVIDd:         4.90 cm      Diastology LVIDs:         4.20 cm      LV e' medial:    3.37 cm/s LV PW:         1.20 cm      LV E/e' medial:  26.7 LV IVS:        1.10 cm      LV e' lateral:   4.68 cm/s LVOT diam:     2.10 cm      LV E/e' lateral: 19.2 LV SV:         58 LV SV Index:   35           2D Longitudinal Strain LVOT Area:     3.46 cm     2D Strain GLS Avg:     -9.4 %  LV Volumes (MOD) LV vol d, MOD A2C: 179.0 ml LV vol d, MOD A4C: 154.0 ml LV vol s, MOD A2C: 126.0 ml LV vol s, MOD A4C: 112.0 ml LV SV MOD A2C:     53.0 ml LV SV MOD A4C:     154.0 ml LV SV MOD BP:      44.8 ml RIGHT VENTRICLE RV Basal diam:  3.90 cm LEFT ATRIUM             Index       RIGHT ATRIUM           Index LA diam:        3.80 cm 2.27 cm/m  RA Area:     18.70 cm LA Vol (A2C):   76.2 ml 45.54 ml/m RA Volume:   54.30 ml  32.45 ml/m LA Vol (A4C):   65.4 ml 39.08 ml/m LA Biplane Vol: 70.6 ml 42.19 ml/m  AORTIC VALVE                   PULMONIC VALVE AV Area (Vmax):    2.43  cm    PV Vmax:       0.70 m/s AV Area (Vmean):   2.14 cm    PV Vmean:      48.000 cm/s AV Area (VTI):     2.47 cm    PV VTI:        0.143 m AV Vmax:           116.00 cm/s PV Peak grad:  1.9 mmHg AV Vmean:          77.200 cm/s PV Mean grad:  1.0 mmHg AV VTI:            0.236 m AV Peak Grad:      5.4 mmHg AV Mean Grad:      3.0 mmHg LVOT Vmax:         81.40 cm/s LVOT Vmean:        47.800 cm/s LVOT VTI:          0.168 m LVOT/AV VTI ratio: 0.71 AI  PHT:            649 msec  AORTA Ao Root diam: 3.10 cm MITRAL VALVE                 TRICUSPID VALVE MV Area (PHT): 2.85 cm      TR Peak grad:   69.2 mmHg MV Area VTI:   1.61 cm      TR Vmax:        416.00 cm/s MV Peak grad:  4.8 mmHg MV Mean grad:  1.0 mmHg      SHUNTS MV Vmax:       1.09 m/s      Systemic VTI:  0.17 m MV Vmean:      54.0 cm/s     Systemic Diam: 2.10 cm MV Decel Time: 266 msec MR Peak grad:    126.8 mmHg MR Mean grad:    79.0 mmHg MR Vmax:         563.00 cm/s MR Vmean:        415.0 cm/s MR PISA:         1.01 cm MR PISA Eff ROA: 7 mm MR PISA Radius:  0.40 cm MV E velocity: 90.00 cm/s MV A velocity: 42.60 cm/s MV E/A ratio:  2.11 Kate Sable MD Electronically signed by Kate Sable MD Signature Date/Time: 04/10/2021/4:04:12 PM    Final      Assessment and Recommendation  87 y.o. male with known coronary disease status post coronary bypass graft previous prevascular accident with apparently acute on chronic systolic dysfunction congestive heart failure I hyponatremia with good urine output and improvements over the last several days without evidence of acute coronary syndrome 1.  Continue hypertension control and treatment of acute on chronic systolic dysfunction congestive heart failure with ramipril with reasonable glomerular filtration rate of 53 2.  High intensity cholesterol therapy for coronary artery disease 3.  No further cardiac diagnostics necessary at this time 4.  If ambulating well with no evidence of further symptoms and urine output is reasonable would discharged home from cardiac standpoint with follow-up in 1 to 2 weeks with the reinstatement of oral Lasix at 40 mg each day  Signed, Serafina Royals M.D. FACC

## 2021-04-13 NOTE — Progress Notes (Signed)
[facility] [id number] for assessment and medications

## 2021-04-13 NOTE — Progress Notes (Signed)
PROGRESS NOTE    Daniel Bolton  [id number] DOB: 1933/10/22 DOA: 04/10/2021 PCP: [name], MD  [id number]   Assessment & Plan:   Active Problems:   Protein-calorie malnutrition, severe   Daniel Bolton is a 87 y.o. Hispanic male with medical history significant for hypertension, CHF with previous EF of 50 to 55%, valvular regurgitations and CVA, who presented to the emergency room with acute onset of` bilateral lower extremity edema as well as associated dyspnea and cough.  He was recently admitted for acute CHF and hyponatremia.  For the last 1 to 2 weeks his lower extremity edema has been worsening.  He has been experiencing orthopnea and mild productive cough.   1.  Acute on chronic combined CHF  Most recent EF was 50 to 55% in April/2022, however, EF worsened to 25-30% during this hospitalization. --start on IV lasix 40 mg BID and diuresing well. Plan: --IV lasix 40 mg x1, will transition to oral tomorrow --cont ramipril --Hold home Toprol due to low HR  Hyponatremia, hypervolemia --Na 113 on presentation, likely due to hypervolemia. --Na improving with diuresis, however, now stabilized around 125 Plan: --IV lasix 40 mg x1 today --cont to hold home HCTZ and torsemide  2.  Essential hypertension. --IV lasix 40 mg x1 today --cont ramipril --Hold home Toprol due to low HR --home torsemide and HCTZ on hold while on IV lasix  3.  Dyslipidemia. --cont statin  4.  Coronary artery disease status post CABG. --cont ASA and plavix  5.  GERD. --cont PPI  Hypokalemia --from aggressive diuresis --monitor and replete with oral potassium  Severe malnutrition --related to chronic illness --Nutrition consulted  --Ensure BID and supplements   DVT prophylaxis: Lovenox SQ Code Status: Full code  Family Communication:  Level of care: Progressive Cardiac Dispo:   The patient is from: home Anticipated d/c is to: home Anticipated d/c date is:  tomorrow Patient currently is not medically ready to d/c due to: on IV lasix, hyponatremia still severe   Subjective and Interval History:  No new complaints.  Pt was seen walking around his room and straightening up his bed.   Objective: Vitals:   04/13/21 0357 04/13/21 0740 04/13/21 1149 04/13/21 1641  BP: (!) 137/52 (!) 127/51 (!) 138/45 (!) 139/56  Pulse: 61 (!) 54 (!) 53 (!) 56  Resp: '19 16 16 17  '$ Temp: 98 F (36.7 C) 98 F (36.7 C) 98.3 F (36.8 C) 97.9 F (36.6 C)  TempSrc: Oral  Oral   SpO2: 93% 92% 98% 99%  Weight: 57.4 kg     Height:        Intake/Output Summary (Last 24 hours) at 04/13/2021 1724 Last data filed at 04/13/2021 1300 Gross per 24 hour  Intake 960 ml  Output 1750 ml  Net -790 ml   Filed Weights   04/11/21 0400 04/12/21 0505 04/13/21 0357  Weight: 59 kg 57.6 kg 57.4 kg    Examination:   Constitutional: NAD, AAOx3, up and walking around his bed HEENT: conjunctivae and lids normal, EOMI CV: No cyanosis.   RESP: normal respiratory effort, clear, on RA SKIN: warm, dry Neuro: II - XII grossly intact.   Psych: Normal mood and affect.     Data Reviewed: I have personally reviewed following labs and imaging studies  CBC: Recent Labs  Lab 04/09/21 2340 04/10/21 0416 04/11/21 0538 04/12/21 0458 04/13/21 0443  WBC 8.6 7.5 6.7 6.3 6.5  HGB 10.1* 10.0* 10.2* 9.7* 9.9*  HCT 28.7* 28.2*  29.2* 27.9* 28.6*  MCV 81.3 80.6 80.7 81.8 81.3  PLT 147* 139* 152 151 [id number]   Basic Metabolic Panel: Recent Labs  Lab 04/09/21 2340 04/10/21 0416 04/11/21 0538 04/12/21 0458 04/13/21 0443  NA 113* 115* 124* 126* 125*  K 4.0 3.6 2.9* 3.7 4.0  CL 81* 80* 83* 88* 88*  CO2 '25 29 31 29 30  '$ GLUCOSE 99 111* 98 85 101*  BUN 31* 32* 33* 39* 46*  CREATININE 1.45* 1.40* 1.25* 1.38* 1.33*  CALCIUM 8.2* 8.4* 8.5* 8.2* 8.4*  MG  --   --  1.9 1.7 2.0   GFR: Estimated Creatinine Clearance: 32.7 mL/min (A) (by C-G formula based on SCr of 1.33 mg/dL (H)). Liver  Function Tests: No results for input(s): AST, ALT, ALKPHOS, BILITOT, PROT, ALBUMIN in the last 168 hours. No results for input(s): LIPASE, AMYLASE in the last 168 hours. No results for input(s): AMMONIA in the last 168 hours. Coagulation Profile: No results for input(s): INR, PROTIME in the last 168 hours. Cardiac Enzymes: No results for input(s): CKTOTAL, CKMB, CKMBINDEX, TROPONINI in the last 168 hours. BNP (last 3 results) No results for input(s): PROBNP in the last 8760 hours. HbA1C: No results for input(s): HGBA1C in the last 72 hours. CBG: No results for input(s): GLUCAP in the last 168 hours. Lipid Profile: No results for input(s): CHOL, HDL, LDLCALC, TRIG, CHOLHDL, LDLDIRECT in the last 72 hours. Thyroid Function Tests: No results for input(s): TSH, T4TOTAL, FREET4, T3FREE, THYROIDAB in the last 72 hours.  Anemia Panel: No results for input(s): VITAMINB12, FOLATE, FERRITIN, TIBC, IRON, RETICCTPCT in the last 72 hours. Sepsis Labs: No results for input(s): PROCALCITON, LATICACIDVEN in the last 168 hours.  Recent Results (from the past 240 hour(s))  Resp Panel by RT-PCR (Flu A&B, Covid) Nasopharyngeal Swab     Status: None   Collection Time: 04/10/21  1:11 AM   Specimen: Nasopharyngeal Swab; Nasopharyngeal(NP) swabs in vial transport medium  Result Value Ref Range Status   SARS Coronavirus 2 by RT PCR NEGATIVE NEGATIVE Final    Comment: (NOTE) SARS-CoV-2 target nucleic acids are NOT DETECTED.  The SARS-CoV-2 RNA is generally detectable in upper respiratory specimens during the acute phase of infection. The lowest concentration of SARS-CoV-2 viral copies this assay can detect is 138 copies/mL. A negative result does not preclude SARS-Cov-2 infection and should not be used as the sole basis for treatment or other patient management decisions. A negative result may occur with  improper specimen collection/handling, submission of specimen other than nasopharyngeal swab,  presence of viral mutation(s) within the areas targeted by this assay, and inadequate number of viral copies(<138 copies/mL). A negative result must be combined with clinical observations, patient history, and epidemiological information. The expected result is Negative.  Fact Sheet for Patients:  EntrepreneurPulse.com.au  Fact Sheet for Healthcare Providers:  IncredibleEmployment.be  This test is no t yet approved or cleared by the [place] FDA and  has been authorized for detection and/or diagnosis of SARS-CoV-2 by FDA under an Emergency Use Authorization (EUA). This EUA will remain  in effect (meaning this test can be used) for the duration of the COVID-19 declaration under Section 564(b)(1) of the Act, 21 U.S.C.section 360bbb-3(b)(1), unless the authorization is terminated  or revoked sooner.       Influenza A by PCR NEGATIVE NEGATIVE Final   Influenza B by PCR NEGATIVE NEGATIVE Final    Comment: (NOTE) The Xpert Xpress SARS-CoV-2/FLU/RSV plus assay is intended as an aid in the  diagnosis of influenza from Nasopharyngeal swab specimens and should not be used as a sole basis for treatment. Nasal washings and aspirates are unacceptable for Xpert Xpress SARS-CoV-2/FLU/RSV testing.  Fact Sheet for Patients: EntrepreneurPulse.com.au  Fact Sheet for Healthcare Providers: IncredibleEmployment.be  This test is not yet approved or cleared by the [place] FDA and has been authorized for detection and/or diagnosis of SARS-CoV-2 by FDA under an Emergency Use Authorization (EUA). This EUA will remain in effect (meaning this test can be used) for the duration of the COVID-19 declaration under Section 564(b)(1) of the Act, 21 U.S.C. section 360bbb-3(b)(1), unless the authorization is terminated or revoked.  Performed at [facility], [address], [place], [place] [address]        Radiology Studies: No results found.   Scheduled Meds:  aspirin EC  81 mg Oral Daily   atorvastatin  40 mg Oral Daily   clopidogrel  75 mg Oral Daily   enoxaparin (LOVENOX) injection  40 mg Subcutaneous Q24H   feeding supplement  237 mL Oral BID BM   pantoprazole  40 mg Oral Daily   ramipril  5 mg Oral Daily   Continuous Infusions:   LOS: 3 days     Enzo Bi, MD [facility] If 7PM-7AM, please contact night-coverage 04/13/2021, 5:24 PM

## 2021-04-14 LAB — BASIC METABOLIC PANEL
Anion gap: 5 (ref 5–15)
BUN: 53 mg/dL — ABNORMAL HIGH (ref 8–23)
CO2: 32 mmol/L (ref 22–32)
Calcium: 8.3 mg/dL — ABNORMAL LOW (ref 8.9–10.3)
Chloride: 90 mmol/L — ABNORMAL LOW (ref 98–111)
Creatinine, Ser: 1.51 mg/dL — ABNORMAL HIGH (ref 0.61–1.24)
GFR, Estimated: 45 mL/min — ABNORMAL LOW (ref >60)
Glucose, Bld: 130 mg/dL — ABNORMAL HIGH (ref 70–99)
Potassium: 4.1 mmol/L (ref 3.5–5.1)
Sodium: 127 mmol/L — ABNORMAL LOW (ref 135–145)

## 2021-04-14 LAB — CBC
HCT: 27.9 % — ABNORMAL LOW (ref 39.0–52.0)
Hemoglobin: 9.6 g/dL — ABNORMAL LOW (ref 13.0–17.0)
MCH: 28.5 pg (ref 26.0–34.0)
MCHC: 34.4 g/dL (ref 30.0–36.0)
MCV: 82.8 fL (ref 80.0–100.0)
Platelets: 175 10*3/uL (ref 150–400)
RBC: 3.37 MIL/uL — ABNORMAL LOW (ref 4.22–5.81)
RDW: 13.8 % (ref 11.5–15.5)
WBC: 5.6 10*3/uL (ref 4.0–10.5)
nRBC: 0 % (ref 0.0–0.2)

## 2021-04-14 LAB — MAGNESIUM: Magnesium: 1.9 mg/dL (ref 1.7–2.4)

## 2021-04-14 MED ORDER — ENSURE ENLIVE PO LIQD: 237.0000 mL | Freq: Two times a day (BID) | ORAL | 12 refills | Status: DC

## 2021-04-14 MED ORDER — FUROSEMIDE 40 MG PO TABS: 40.0000 mg | ORAL_TABLET | Freq: Every day | ORAL | 0 refills | Status: DC

## 2021-04-14 MED ORDER — ENOXAPARIN SODIUM 30 MG/0.3ML IJ SOSY: 30.0000 mg | PREFILLED_SYRINGE | INTRAMUSCULAR | Status: DC

## 2021-04-14 MED ORDER — FUROSEMIDE 40 MG PO TABS
40.0000 mg | ORAL_TABLET | Freq: Every day | ORAL | Status: DC
  Administered 2021-04-14: 40 mg via ORAL
  Filled 2021-04-14: qty 1

## 2021-04-14 NOTE — TOC Progression Note (Signed)
Transition of Care [facility]) - Progression Note    Patient Details  Name: Daniel Bolton MRN: [id number] Date of Birth: 1934/04/20  Transition of Care [facility]) CM/SW [place], LCSW Phone Number: 04/14/2021, 10:30 AM  Clinical Narrative: CSW met with patient. Used interpreter Eyvonne Mechanic [phone]). PT recommending home health. Patient is agreeable. No agency preference. Patient really wants a nurse to keep an eye on vitals. Also focused on getting his blood pressure medication today. Left message for [facility] representative to see if he could accept referral. Patient stated he already has a rolling walker at home. Patient trying to get in touch with daughter so she can pick him up today.    Expected Discharge Plan: [place] Barriers to Discharge: Continued Medical Work up  Expected Discharge Plan and Services Expected Discharge Plan: [place] Choice: [place] arrangements for the past 2 months: Single Family Home Expected Discharge Date: 04/14/21                                     Social Determinants of Health (SDOH) Interventions    Readmission Risk Interventions No flowsheet data found.

## 2021-04-14 NOTE — Discharge Summary (Signed)
Physician Discharge Summary   Eris Halteman  male DOB: 11-27-1933  [id number]  PCP: [name], MD  Admit date: 04/10/2021 Discharge date: 04/14/2021  Admitted From: home Disposition:  home Home Health: Yes CODE STATUS: Full code  Discharge Instructions     Discharge instructions   Complete by: As directed    You have received IV lasix to remove fluids.  Cardiologist wants you to continue with oral Lasix 40 mg daily (so your home torsemide is stopped).    Hydralazine is the medication that was making you dizzy.  I have discontinued it.  Please follow up with outpatient cardiology.   Dr. Enzo Bi [facility] Course:  For full details, please see H&P, progress notes, consult notes and ancillary notes.  Briefly,  Macklan Guagliardo is a 87 y.o. Hispanic male with medical history significant for hypertension, CHF with previous EF of 50 to 55%, valvular regurgitations and CVA, who presented to the emergency room with acute onset of` bilateral lower extremity edema as well as associated dyspnea and cough.    He was recently admitted for acute CHF and hyponatremia.  For the last 1 to 2 weeks his lower extremity edema has been worsening.  He has been experiencing orthopnea and mild productive cough.   1.  Acute on chronic combined CHF Most recent EF was 50 to 55% in April/2022, however, EF worsened to 25-30% during this hospitalization. --start on IV lasix 40 mg BID and diuresing well. --pt was discharged on oral lasix 40 mg daily.   --cont ramipril --home Toprol held during hospitalization due to low HR, resumed at discharge.   Hyponatremia, hypervolemia --Na 113 on presentation, likely due to hypervolemia. --Na improving with diuresis, and was 127 prior to discharge.   --home HCTZ and torsemide d/c'ed.  Essential hypertension. --home HCTZ and torsemide d/c'ed. --Home hydralazine d/c'ed due to it causing severe dizziness, per pt. --Pt received  IV diuresis while inpatient and was discharged on oral lasix 40 mg daily. --cont ramipril --Home Toprol resumed at discharge.  Dyslipidemia. --cont statin  Coronary artery disease status post CABG. --cont ASA and plavix  GERD. --cont PPI   Hypokalemia --from aggressive diuresis --monitored and repleted with oral potassium   Severe malnutrition --related to chronic illness --Nutrition consulted  --Ensure BID and supplements   Discharge Diagnoses:  Active Problems:   Protein-calorie malnutrition, severe   30 Day Unplanned Readmission Risk Score    Flowsheet Row ED to Hosp-Admission (Current) from 04/10/2021 in [facility]  30 Day Unplanned Readmission Risk Score (%) 28.79 Filed at 04/14/2021 0801       This score is the patient's risk of an unplanned readmission within 30 days of being discharged (0 -100%). The score is based on dignosis, age, lab data, medications, orders, and past utilization.   Low:  0-14.9   Medium: 15-21.9   High: 22-29.9   Extreme: 30 and above          Discharge Instructions:  Allergies as of 04/14/2021       Reactions   Hydralazine    Pt has severe dizziness with it, and does not want to take it.        Medication List     STOP taking these medications    hydrALAZINE 25 MG tablet Commonly known as: APRESOLINE   hydrochlorothiazide 25 MG tablet Commonly known as: HYDRODIURIL   potassium chloride 8 MEQ tablet Commonly  known as: KLOR-CON   torsemide 20 MG tablet Commonly known as: DEMADEX       TAKE these medications    albuterol 108 (90 Base) MCG/ACT inhaler Commonly known as: VENTOLIN HFA Inhale 2 puffs into the lungs every 6 (six) hours as needed for wheezing or shortness of breath.   aspirin 81 MG EC tablet Take 1 tablet (81 mg total) by mouth daily. Swallow whole.   clopidogrel 75 MG tablet Commonly known as: PLAVIX Take 75 mg by mouth daily.   feeding supplement Liqd Take 237 mLs by  mouth 2 (two) times daily between meals.   furosemide 40 MG tablet Commonly known as: LASIX Take 1 tablet (40 mg total) by mouth daily.   metoprolol succinate 50 MG 24 hr tablet Commonly known as: TOPROL-XL Take 50 mg by mouth daily. Take with or immediately following a meal.   pantoprazole 40 MG tablet Commonly known as: PROTONIX Take 1 tablet (40 mg total) by mouth daily.   ramipril 5 MG capsule Commonly known as: ALTACE Take 5 mg by mouth daily.   simvastatin 80 MG tablet Commonly known as: ZOCOR Take 80 mg by mouth daily.         Follow-up Information     [name], MD Follow up in 1 week(s).   Specialty: Family Medicine Contact information: [address] [address] [phone]         Corey Skains, MD Follow up in 1 week(s).   Specialty: Cardiology Contact information: [facility] [place] [address] [phone]                 Allergies  Allergen Reactions   Hydralazine     Pt has severe dizziness with it, and does not want to take it.     The results of significant diagnostics from this hospitalization (including imaging, microbiology, ancillary and laboratory) are listed below for reference.   Consultations:   Procedures/Studies: DG Chest 2 View  Result Date: 04/09/2021 CLINICAL DATA:  Cough.  CHF.  Abdominal and extremity swelling. EXAM: CHEST - 2 VIEW COMPARISON:  Most recent radiograph 03/22/2021 FINDINGS: Post median sternotomy and CABG. Unchanged cardiomegaly. Aortic atherosclerosis. Increasing left pleural effusion with stable right pleural effusion. Increasing fluid in the fissures. Increasing pulmonary edema. No pneumothorax. Stable osseous structures. IMPRESSION: CHF. Increasing left pleural effusion and pulmonary edema since prior exam. Electronically Signed   By: Keith Rake M.D.   On: 04/09/2021 23:53   DG Chest 2 View  Result Date:  03/22/2021 CLINICAL DATA:  Shortness of breath and cough for 1 month. EXAM: CHEST - 2 VIEW COMPARISON:  03/10/2021 FINDINGS: Postoperative changes in the mediastinum. Cardiac enlargement with diffuse bilateral airspace and interstitial infiltrates, likely edema. Small bilateral pleural effusions. Similar appearance to previous study. Calcification of the aorta. No pneumothorax. IMPRESSION: Cardiac enlargement with bilateral pulmonary infiltrates and small effusions. Electronically Signed   By: Lucienne Capers M.D.   On: 03/22/2021 00:18   ECHOCARDIOGRAM COMPLETE  Result Date: 04/10/2021    ECHOCARDIOGRAM REPORT   Patient Name:   RONDO KOEL [facility] Date of Exam: 04/10/2021 Medical Rec #:  [id number]           Height:       69.0 in Accession #:    [id number]          Weight:       121.8 lb Date of Birth:  1933/11/05  BSA:          1.673 m Patient Age:    65 years            BP:           156/67 mmHg Patient Gender: M                   HR:           49 bpm. Exam Location:  [facility] Procedure: 2D Echo, Color Doppler, Cardiac Doppler, Strain Analysis and            Intracardiac Opacification Agent Indications:     I50.31 CHF-Acute Diastolic  History:         Patient has no prior history of Echocardiogram examinations.                  Stroke; Risk Factors:Hypertension.  Sonographer:     Charmayne Sheer RDCS (AE) Referring Phys:  [id number] Arvella Merles MANSY Diagnosing Phys: Kate Sable MD  Sonographer Comments: Global longitudinal strain was attempted. IMPRESSIONS  1. Left ventricular ejection fraction, by estimation, is 25 to 30%. The left ventricle has severely decreased function. The left ventricle demonstrates global hypokinesis. There is mild left ventricular hypertrophy. Left ventricular diastolic parameters  are consistent with Grade II diastolic dysfunction (pseudonormalization). The average left ventricular global longitudinal strain is -9.4 %. The global longitudinal strain is abnormal.  2. Right ventricular  systolic function is moderately reduced. The right ventricular size is normal.  3. Left atrial size was mild to moderately dilated.  4. Right atrial size was mildly dilated.  5. The mitral valve is normal in structure. Mild to moderate mitral valve regurgitation.  6. Tricuspid valve regurgitation is mild to moderate.  7. The aortic valve is tricuspid. Aortic valve regurgitation is mild. Mild aortic valve sclerosis is present, with no evidence of aortic valve stenosis.  8. The inferior vena cava is dilated in size with >50% respiratory variability, suggesting right atrial pressure of 8 mmHg. FINDINGS  Left Ventricle: Left ventricular ejection fraction, by estimation, is 25 to 30%. The left ventricle has severely decreased function. The left ventricle demonstrates global hypokinesis. Definity contrast agent was given IV to delineate the left ventricular endocardial borders. The average left ventricular global longitudinal strain is -9.4 %. The global longitudinal strain is abnormal. The left ventricular internal cavity size was normal in size. There is mild left ventricular hypertrophy. Left  ventricular diastolic parameters are consistent with Grade II diastolic dysfunction (pseudonormalization). Right Ventricle: The right ventricular size is normal. No increase in right ventricular wall thickness. Right ventricular systolic function is moderately reduced. Left Atrium: Left atrial size was mild to moderately dilated. Right Atrium: Right atrial size was mildly dilated. Pericardium: There is no evidence of pericardial effusion. Mitral Valve: The mitral valve is normal in structure. Mild to moderate mitral valve regurgitation. MV peak gradient, 4.8 mmHg. The mean mitral valve gradient is 1.0 mmHg. Tricuspid Valve: The tricuspid valve is normal in structure. Tricuspid valve regurgitation is mild to moderate. Aortic Valve: The aortic valve is tricuspid. Aortic valve regurgitation is mild. Aortic regurgitation PHT measures  649 msec. Mild aortic valve sclerosis is present, with no evidence of aortic valve stenosis. Aortic valve mean gradient measures 3.0 mmHg. Aortic valve peak gradient measures 5.4 mmHg. Aortic valve area, by VTI measures 2.47 cm. Pulmonic Valve: The pulmonic valve was normal in structure. Pulmonic valve regurgitation is mild. Aorta: The aortic root is normal in size and  structure. Venous: The inferior vena cava is dilated in size with greater than 50% respiratory variability, suggesting right atrial pressure of 8 mmHg. IAS/Shunts: No atrial level shunt detected by color flow Doppler.  LEFT VENTRICLE PLAX 2D LVIDd:         4.90 cm      Diastology LVIDs:         4.20 cm      LV e' medial:    3.37 cm/s LV PW:         1.20 cm      LV E/e' medial:  26.7 LV IVS:        1.10 cm      LV e' lateral:   4.68 cm/s LVOT diam:     2.10 cm      LV E/e' lateral: 19.2 LV SV:         58 LV SV Index:   35           2D Longitudinal Strain LVOT Area:     3.46 cm     2D Strain GLS Avg:     -9.4 %  LV Volumes (MOD) LV vol d, MOD A2C: 179.0 ml LV vol d, MOD A4C: 154.0 ml LV vol s, MOD A2C: 126.0 ml LV vol s, MOD A4C: 112.0 ml LV SV MOD A2C:     53.0 ml LV SV MOD A4C:     154.0 ml LV SV MOD BP:      44.8 ml RIGHT VENTRICLE RV Basal diam:  3.90 cm LEFT ATRIUM             Index       RIGHT ATRIUM           Index LA diam:        3.80 cm 2.27 cm/m  RA Area:     18.70 cm LA Vol (A2C):   76.2 ml 45.54 ml/m RA Volume:   54.30 ml  32.45 ml/m LA Vol (A4C):   65.4 ml 39.08 ml/m LA Biplane Vol: 70.6 ml 42.19 ml/m  AORTIC VALVE                   PULMONIC VALVE AV Area (Vmax):    2.43 cm    PV Vmax:       0.70 m/s AV Area (Vmean):   2.14 cm    PV Vmean:      48.000 cm/s AV Area (VTI):     2.47 cm    PV VTI:        0.143 m AV Vmax:           116.00 cm/s PV Peak grad:  1.9 mmHg AV Vmean:          77.200 cm/s PV Mean grad:  1.0 mmHg AV VTI:            0.236 m AV Peak Grad:      5.4 mmHg AV Mean Grad:      3.0 mmHg LVOT Vmax:         81.40 cm/s  LVOT Vmean:        47.800 cm/s LVOT VTI:          0.168 m LVOT/AV VTI ratio: 0.71 AI PHT:            649 msec  AORTA Ao Root diam: 3.10 cm MITRAL VALVE                 TRICUSPID VALVE MV Area (PHT):  2.85 cm      TR Peak grad:   69.2 mmHg MV Area VTI:   1.61 cm      TR Vmax:        416.00 cm/s MV Peak grad:  4.8 mmHg MV Mean grad:  1.0 mmHg      SHUNTS MV Vmax:       1.09 m/s      Systemic VTI:  0.17 m MV Vmean:      54.0 cm/s     Systemic Diam: 2.10 cm MV Decel Time: 266 msec MR Peak grad:    126.8 mmHg MR Mean grad:    79.0 mmHg MR Vmax:         563.00 cm/s MR Vmean:        415.0 cm/s MR PISA:         1.01 cm MR PISA Eff ROA: 7 mm MR PISA Radius:  0.40 cm MV E velocity: 90.00 cm/s MV A velocity: 42.60 cm/s MV E/A ratio:  2.11 Kate Sable MD Electronically signed by Kate Sable MD Signature Date/Time: 04/10/2021/4:04:12 PM    Final       Labs: BNP (last 3 results) Recent Labs    03/10/21 2342 03/21/21 2350 04/09/21 2340  BNP 2,626.3* 1,439.0* 123XX123*   Basic Metabolic Panel: Recent Labs  Lab 04/10/21 0416 04/11/21 0538 04/12/21 0458 04/13/21 0443 04/14/21 0545  NA 115* 124* 126* 125* 127*  K 3.6 2.9* 3.7 4.0 4.1  CL 80* 83* 88* 88* 90*  CO2 '29 31 29 30 '$ 32  GLUCOSE 111* 98 85 101* 130*  BUN 32* 33* 39* 46* 53*  CREATININE 1.40* 1.25* 1.38* 1.33* 1.51*  CALCIUM 8.4* 8.5* 8.2* 8.4* 8.3*  MG  --  1.9 1.7 2.0 1.9   Liver Function Tests: No results for input(s): AST, ALT, ALKPHOS, BILITOT, PROT, ALBUMIN in the last 168 hours. No results for input(s): LIPASE, AMYLASE in the last 168 hours. No results for input(s): AMMONIA in the last 168 hours. CBC: Recent Labs  Lab 04/10/21 0416 04/11/21 0538 04/12/21 0458 04/13/21 0443 04/14/21 0545  WBC 7.5 6.7 6.3 6.5 5.6  HGB 10.0* 10.2* 9.7* 9.9* 9.6*  HCT 28.2* 29.2* 27.9* 28.6* 27.9*  MCV 80.6 80.7 81.8 81.3 82.8  PLT 139* 152 151 170 175   Cardiac Enzymes: No results for input(s): CKTOTAL, CKMB, CKMBINDEX, TROPONINI  in the last 168 hours. BNP: Invalid input(s): POCBNP CBG: No results for input(s): GLUCAP in the last 168 hours. D-Dimer No results for input(s): DDIMER in the last 72 hours. Hgb A1c No results for input(s): HGBA1C in the last 72 hours. Lipid Profile No results for input(s): CHOL, HDL, LDLCALC, TRIG, CHOLHDL, LDLDIRECT in the last 72 hours. Thyroid function studies No results for input(s): TSH, T4TOTAL, T3FREE, THYROIDAB in the last 72 hours.  Invalid input(s): FREET3 Anemia work up No results for input(s): VITAMINB12, FOLATE, FERRITIN, TIBC, IRON, RETICCTPCT in the last 72 hours. Urinalysis    Component Value Date/Time   COLORURINE STRAW (A) 12/11/2020 1015   APPEARANCEUR CLEAR (A) 12/11/2020 1015   LABSPEC 1.005 12/11/2020 1015   PHURINE 5.0 12/11/2020 1015   GLUCOSEU NEGATIVE 12/11/2020 1015   HGBUR NEGATIVE 12/11/2020 1015   BILIRUBINUR NEGATIVE 12/11/2020 1015   KETONESUR 5 (A) 12/11/2020 1015   PROTEINUR NEGATIVE 12/11/2020 1015   NITRITE NEGATIVE 12/11/2020 1015   LEUKOCYTESUR NEGATIVE 12/11/2020 1015   Sepsis Labs Invalid input(s): PROCALCITONIN,  WBC,  LACTICIDVEN Microbiology Recent Results (from the past 240 hour(s))  Resp Panel by RT-PCR (Flu A&B, Covid) Nasopharyngeal Swab     Status: None   Collection Time: 04/10/21  1:11 AM   Specimen: Nasopharyngeal Swab; Nasopharyngeal(NP) swabs in vial transport medium  Result Value Ref Range Status   SARS Coronavirus 2 by RT PCR NEGATIVE NEGATIVE Final    Comment: (NOTE) SARS-CoV-2 target nucleic acids are NOT DETECTED.  The SARS-CoV-2 RNA is generally detectable in upper respiratory specimens during the acute phase of infection. The lowest concentration of SARS-CoV-2 viral copies this assay can detect is 138 copies/mL. A negative result does not preclude SARS-Cov-2 infection and should not be used as the sole basis for treatment or other patient management decisions. A negative result may occur with  improper  specimen collection/handling, submission of specimen other than nasopharyngeal swab, presence of viral mutation(s) within the areas targeted by this assay, and inadequate number of viral copies(<138 copies/mL). A negative result must be combined with clinical observations, patient history, and epidemiological information. The expected result is Negative.  Fact Sheet for Patients:  EntrepreneurPulse.com.au  Fact Sheet for Healthcare Providers:  IncredibleEmployment.be  This test is no t yet approved or cleared by the [place] FDA and  has been authorized for detection and/or diagnosis of SARS-CoV-2 by FDA under an Emergency Use Authorization (EUA). This EUA will remain  in effect (meaning this test can be used) for the duration of the COVID-19 declaration under Section 564(b)(1) of the Act, 21 U.S.C.section 360bbb-3(b)(1), unless the authorization is terminated  or revoked sooner.       Influenza A by PCR NEGATIVE NEGATIVE Final   Influenza B by PCR NEGATIVE NEGATIVE Final    Comment: (NOTE) The Xpert Xpress SARS-CoV-2/FLU/RSV plus assay is intended as an aid in the diagnosis of influenza from Nasopharyngeal swab specimens and should not be used as a sole basis for treatment. Nasal washings and aspirates are unacceptable for Xpert Xpress SARS-CoV-2/FLU/RSV testing.  Fact Sheet for Patients: EntrepreneurPulse.com.au  Fact Sheet for Healthcare Providers: IncredibleEmployment.be  This test is not yet approved or cleared by the [place] FDA and has been authorized for detection and/or diagnosis of SARS-CoV-2 by FDA under an Emergency Use Authorization (EUA). This EUA will remain in effect (meaning this test can be used) for the duration of the COVID-19 declaration under Section 564(b)(1) of the Act, 21 U.S.C. section 360bbb-3(b)(1), unless the authorization is terminated or revoked.  Performed at  [facility], [address] [place], [place] [address]      Total time spend on discharging this patient, including the last patient exam, discussing the hospital stay, instructions for ongoing care as it relates to all pertinent caregivers, as well as preparing the medical discharge records, prescriptions, and/or referrals as applicable, is 35 minutes.    Enzo Bi, MD  [facility] 04/14/2021, 9:58 AM

## 2021-04-14 NOTE — Progress Notes (Signed)
PHARMACIST - PHYSICIAN COMMUNICATION  CONCERNING:  Enoxaparin (Lovenox) for DVT Prophylaxis    RECOMMENDATION: Patient was prescribed enoxaprin '40mg'$  q24 hours for VTE prophylaxis.   Filed Weights   04/12/21 0505 04/13/21 0357 04/14/21 0417  Weight: 57.6 kg (126 lb 14.4 oz) 57.4 kg (126 lb 8 oz) 57.7 kg (127 lb 3.3 oz)    Body mass index is 22.53 kg/m.  Estimated Creatinine Clearance: 28.8 mL/min (A) (by C-G formula based on SCr of 1.51 mg/dL (H)).   Patient is candidate for enoxaparin '30mg'$  every 24 hours based on CrCl <8m/min or Weight <45kg  DESCRIPTION: [facility]  Patient is now receiving enoxaparin 30 mg every 24 hours    SPernell Dupre PharmD, BCPS Clinical Pharmacist 04/14/2021 9:04 AM

## 2021-04-14 NOTE — Progress Notes (Signed)
Physical Therapy Treatment Patient Details Name: Daniel Bolton MRN: [id number] DOB: September 18, 1934 Today's Date: 04/14/2021    History of Present Illness Pt is a 87 y.o. M with a medical history significant for HTN, CHF, CVA who arrived to ED for bilateral LE edema, admitted for CHF and hypoatremia. No PMH listed in chart.    PT Comments    Patient is making progress towards meeting PT goals. Patient with increased independence with ambulation and independence with functional mobility during this session. Educated patient to use rolling walker for safety and fall prevention with ambulation. PT will continue to follow to maximize independence and facilitate return to prior level of function.     Follow Up Recommendations  Home health PT     Equipment Recommendations  None recommended by PT (patient reports he has a rolling walker at home)    Recommendations for Other Services       Precautions / Restrictions Precautions Precautions: Fall Restrictions Weight Bearing Restrictions: No    Mobility  Bed Mobility               General bed mobility comments: not addressed this session    Transfers Overall transfer level: Modified independent Equipment used: None Transfers: Sit to/from Stand Sit to Stand: Modified independent (Device/Increase time)         General transfer comment: increased time required to complete tasks. no physical assistance needed. cues for safety/task initiation  Ambulation/Gait Ambulation/Gait assistance: Supervision Gait Distance (Feet): 175 Feet Assistive device: Rolling walker (2 wheeled) Gait Pattern/deviations: Narrow base of support Gait velocity: decreased   General Gait Details: educated patient to use rolling walker for safety and fall prevention at home. no loss of balance with ambulation, no shortness of breath noted with functional activity in standing   Stairs             Wheelchair Mobility    Modified Rankin  (Stroke Patients Only)       Balance                                            Cognition Arousal/Alertness: Awake/alert Behavior During Therapy: WFL for tasks assessed/performed Overall Cognitive Status: Within Functional Limits for tasks assessed                                 General Comments: Ipad interpreter services used, ID # [id number]. patient follows single step commands without difficulty      Exercises      General Comments        Pertinent Vitals/Pain Pain Assessment: No/denies pain    Home Living                      Prior Function            PT Goals (current goals can now be found in the care plan section) Acute Rehab PT Goals Patient Stated Goal: to go home PT Goal Formulation: With patient Time For Goal Achievement: 04/26/21 Potential to Achieve Goals: Good Progress towards PT goals: Progressing toward goals    Frequency    Min 2X/week      PT Plan Current plan remains appropriate    Co-evaluation              AM-PAC PT "6 Clicks"  Mobility   Outcome Measure  Help needed turning from your back to your side while in a flat bed without using bedrails?: None Help needed moving from lying on your back to sitting on the side of a flat bed without using bedrails?: None Help needed moving to and from a bed to a chair (including a wheelchair)?: None Help needed standing up from a chair using your arms (e.g., wheelchair or bedside chair)?: None Help needed to walk in hospital room?: A Little Help needed climbing 3-5 steps with a railing? : A Little 6 Click Score: 22    End of Session Equipment Utilized During Treatment: Gait belt Activity Tolerance: Patient tolerated treatment well Patient left: with call bell/phone within reach (sitting up on side of bed) Nurse Communication: Mobility status PT Visit Diagnosis: Muscle weakness (generalized) (M62.81)     Time: [id number] PT Time Calculation  (min) (ACUTE ONLY): 11 min  Charges:  $Gait Training: 8-22 mins                     [name], PT, MPT   [name] 04/14/2021, 10:24 AM

## 2021-04-14 NOTE — TOC Transition Note (Signed)
Transition of Care [facility]) - CM/SW Discharge Note   Patient Details  Name: Arthel Curren MRN: [id number] Date of Birth: 1933/12/23  Transition of Care [facility]) CM/SW Contact:  Candie Chroman, LCSW Phone Number: 04/14/2021, 10:51 AM   Clinical Narrative: Patient has orders to discharge home today. [facility] has accepted home health referral for PT, RN, and aide. Sent secure chat to MD requesting home health orders. No further concerns. CSW signing off.    Final next level of care: Home w Home Health Services Barriers to Discharge: Barriers Resolved   Patient Goals and CMS Choice        Discharge Placement                Patient to be transferred to facility by: Daughter will take him home.   Patient and family notified of of transfer: 04/14/21  Discharge Plan and Services     Post Acute Care Choice: Home Health                    HH Arranged: RN, PT, Nurse's Aide [facility] Agency: [place] Date [facility] Agency Contacted: 04/14/21   Representative spoke with at [place]: Adela Lank  Social Determinants of Health (SDOH) Interventions     Readmission Risk Interventions No flowsheet data found.

## 2021-04-17 NOTE — Progress Notes (Deleted)
   Patient ID: Tomie Birtcher, male    DOB: [birth year], 87 y.o.   MRN: [id number]  [facility] interpreter present during entire visit  HPI  Mr Kyrollos Roode is a 87 y/o male with a history of  Echo report from 04/10/21 reviewed and showed an EF of 25-30% along with mild LVH, mild/moderate MR/ TR and mild AR. Echo report from 01/14/21 reviewed and showed an EF of 50% along with mild LVH.   Admitted 04/10/21 due to shortness of breath and pedal edema. Initially given IV lasix with transition to oral diuretics. Cardiology consult obtained. Diuretics held due to hyponatremia. Discharged after 4 days. Admitted 03/21/21 due to acute on chronic HF. Initially given IV lasix with transition to oral diuretics. Cardiology consult obtained. Electrolytes corrected. Discharged after 4 days. Admitted 03/10/21 due to acute on chronic HF along with HTN emergency. Initially given IV lasix then it was held due to creatinine bump. Resumed home oral diuretics. Discharged after 4 days.   He presents today for his initial visit with a chief complaint of   Review of Systems    Physical Exam    Assessment & Plan:  1: Chronic heart failure with preserved ejection fraction with structural changes (mild LVH)- - NYHA class - saw cardiology (Fath) 04/04/21 - saw pulmonology Lanney Gins) 04/04/21 - BNP 04/09/21 was 1726.5  2: HTN with CKD- - BP - saw PCP (Bronstein) 12/17/20 - BMP 04/14/21 reviewed and showed sodium 127, potassium 4.1, creatinine 1.51 and GFR 45 - saw nephrology (Korrapati) 01/09/21: returns 04/04/21  3: Hyponatremia- - diuretics held

## 2021-04-18 ENCOUNTER — Telehealth: Payer: Self-pay | Admitting: Family

## 2021-04-18 ENCOUNTER — Ambulatory Visit: Payer: Medicare HMO | Admitting: Family

## 2021-04-18 NOTE — Telephone Encounter (Signed)
Patient did not show for his [facility] appointment on 04/18/21. Will attempt to reschedule.

## 2021-05-03 NOTE — Progress Notes (Signed)
Patient ID: Daniel Bolton, male    DOB: 1934/07/15, 87 y.o.   MRN: [id number]  [facility] interpreter present during entire visit  HPI  Mr Daniel Bolton is a 87 y/o male with a history of HTN, CKD, stroke, hyponatremia and chronic heart failure.    Echo report from 04/10/21 reviewed and showed an EF of 25-30% along with mild LVH, mild/moderate MR/ TR and mild AR. Echo report from 01/14/21 reviewed and showed an EF of 50% along with mild LVH.   Admitted 04/10/21 due to shortness of breath and pedal edema. Initially given IV lasix with transition to oral diuretics. Cardiology consult obtained. Diuretics held due to hyponatremia. Discharged after 4 days. Admitted 03/21/21 due to acute on chronic HF. Initially given IV lasix with transition to oral diuretics. Cardiology consult obtained. Electrolytes corrected. Discharged after 4 days. Admitted 03/10/21 due to acute on chronic HF along with HTN emergency. Initially given IV lasix then it was held due to creatinine bump. Resumed home oral diuretics. Discharged after 4 days.   He presents today for his initial visit with a chief complaint of minimal shortness of breath upon moderate exertion. He describes this as having been present for several months although has improved. He has associated fatigue, dry cough and intermittent dizziness along with this. He denies any difficulty sleeping, abdominal distention, palpitations, pedal edema, chest pain or weight gain.   Has recently had medication changed to olmesartan due to a dry cough thought to possible be due to his ramipril. He does feel like the cough has improved some since the change.   Has scales but hasn't been weighing himself daily. Does check his BP daily as he received a cuff from his insurance company.   Past Medical History:  Diagnosis Date   CHF (congestive heart failure) ([place])    Chronic kidney disease    Hypertension    Hyponatremia    Stroke Daniel Bolton)    Past Surgical History:  Procedure  Laterality Date   BRAIN SURGERY     CARDIAC SURGERY     History reviewed. No pertinent family history. Social History   Tobacco Use   Smoking status: Never   Smokeless tobacco: Never  Substance Use Topics   Alcohol use: No   Allergies  Allergen Reactions   Hydralazine     Pt has severe dizziness with it, and does not want to take it.   Prior to Admission medications   Medication Sig Start Date End Date Taking? Authorizing Provider  albuterol (VENTOLIN HFA) 108 (90 Base) MCG/ACT inhaler Inhale 2 puffs into the lungs every 6 (six) hours as needed for wheezing or shortness of breath.   Yes [provider]  aspirin EC 81 MG EC tablet Take 1 tablet (81 mg total) by mouth daily. Swallow whole. 03/14/21  Yes Danford, Suann Larry, MD  clopidogrel (PLAVIX) 75 MG tablet Take 75 mg by mouth daily.   Yes [provider]  furosemide (LASIX) 40 MG tablet Take 1 tablet (40 mg total) by mouth daily. 04/14/21 07/13/21 Yes Enzo Bi, MD  metoprolol succinate (TOPROL-XL) 50 MG 24 hr tablet Take 50 mg by mouth daily. Take with or immediately following a meal.   Yes [provider]  olmesartan (BENICAR) 20 MG tablet Take 20 mg by mouth daily.   Yes [provider]  simvastatin (ZOCOR) 80 MG tablet Take 80 mg by mouth daily.   Yes [provider]  feeding supplement (ENSURE ENLIVE / ENSURE PLUS) LIQD  Take 237 mLs by mouth 2 (two) times daily between meals. 04/14/21   Enzo Bi, MD  pantoprazole (PROTONIX) 40 MG tablet Take 1 tablet (40 mg total) by mouth daily. Patient not taking: Reported on 05/06/2021 03/26/21   Sharen Hones, MD    Review of Systems  Constitutional:  Positive for fatigue. Negative for appetite change.  Eyes: Negative.   Respiratory:  Positive for cough (dry but improving) and shortness of breath. Negative for chest tightness.   Cardiovascular:  Negative for chest pain, palpitations and leg swelling.  Gastrointestinal:  Negative for abdominal  distention and abdominal pain.  Endocrine: Negative.   Genitourinary: Negative.   Musculoskeletal:  Negative for back pain and neck pain.  Skin: Negative.   Allergic/Immunologic: Negative.   Neurological:  Positive for dizziness (at times) and light-headedness.  Hematological:  Negative for adenopathy. Does not bruise/bleed easily.  Psychiatric/Behavioral:  Negative for dysphoric mood and sleep disturbance (sleeping on 2 pillows). The patient is not nervous/anxious.    Vitals:   05/06/21 1233  BP: (!) 160/78  Pulse: (!) 50  Resp: 18  SpO2: 100%  Weight: 134 lb 6 oz (61 kg)  Height: '5\' 1"'$  (1.549 m)   Wt Readings from Last 3 Encounters:  05/06/21 134 lb 6 oz (61 kg)  04/14/21 127 lb 3.3 oz (57.7 kg)  03/23/21 121 lb 12.8 oz (55.2 kg)   Lab Results  Component Value Date   CREATININE 1.51 (H) 04/14/2021   CREATININE 1.33 (H) 04/13/2021   CREATININE 1.38 (H) 04/12/2021   Physical Exam Vitals reviewed. Exam conducted with a chaperone present (granddaughter).  Constitutional:      Appearance: Normal appearance.  HENT:     Head: Normocephalic and atraumatic.  Cardiovascular:     Rate and Rhythm: Regular rhythm. Bradycardia present.  Pulmonary:     Effort: Pulmonary effort is normal. No respiratory distress.     Breath sounds: No wheezing or rales.  Abdominal:     General: There is no distension.     Palpations: Abdomen is soft.  Musculoskeletal:        General: No tenderness.     Cervical back: Normal range of motion and neck supple.     Right lower leg: No edema.     Left lower leg: No edema.  Skin:    General: Skin is warm and dry.  Neurological:     General: No focal deficit present.     Mental Status: He is alert and oriented to person, place, and time.  Psychiatric:        Mood and Affect: Mood normal.        Behavior: Behavior normal.        Thought Content: Thought content normal.    Assessment & Plan:  1: Chronic heart failure with preserved ejection  fraction with structural changes (mild LVH)- - NYHA class II - euvolemic today - not weighing daily but does have scales; instructed to weigh every morning and call for an overnight weight gain of > 2 pounds or a weekly weight gain of > 5 pounds - not adding salt and granddaughter says that they have reading food labels and removed the salt shaker from the house - saw cardiology (Fath) 04/29/21 & was placed on olmesartan due to cough with slight improvement - consider changing it to entresto - consider adding jardiance - saw pulmonology Lanney Gins) 04/04/21 - BNP 04/09/21 was 1726.5  2: HTN with CKD- - BP mildly elevated today; checks it at  home and says that it is declining - saw PCP (Bronstein) 12/17/20; returns 05/13/21 - BMP 04/14/21 reviewed and showed sodium 127, potassium 4.1, creatinine 1.51 and GFR 45 - saw nephrology Lanora Manis) 02/13/21  3: Hyponatremia- - appears to be chronic   Medication bottles reviewed.   Return in 2 months or sooner for any questions/problems before then.

## 2021-05-06 ENCOUNTER — Ambulatory Visit: Payer: Medicare HMO | Attending: Family | Admitting: Family

## 2021-05-06 ENCOUNTER — Encounter: Payer: Self-pay | Admitting: Family

## 2021-05-06 ENCOUNTER — Other Ambulatory Visit: Payer: Self-pay

## 2021-05-06 VITALS — BP 160/78 | HR 50 | Resp 18 | Ht 61.0 in | Wt 134.4 lb

## 2021-05-06 DIAGNOSIS — Z8673 Personal history of transient ischemic attack (TIA), and cerebral infarction without residual deficits: Secondary | ICD-10-CM | POA: Diagnosis not present

## 2021-05-06 DIAGNOSIS — I10 Essential (primary) hypertension: Secondary | ICD-10-CM

## 2021-05-06 DIAGNOSIS — Z7902 Long term (current) use of antithrombotics/antiplatelets: Secondary | ICD-10-CM | POA: Diagnosis not present

## 2021-05-06 DIAGNOSIS — Z79899 Other long term (current) drug therapy: Secondary | ICD-10-CM | POA: Insufficient documentation

## 2021-05-06 DIAGNOSIS — I5032 Chronic diastolic (congestive) heart failure: Secondary | ICD-10-CM | POA: Diagnosis not present

## 2021-05-06 DIAGNOSIS — I130 Hypertensive heart and chronic kidney disease with heart failure and stage 1 through stage 4 chronic kidney disease, or unspecified chronic kidney disease: Secondary | ICD-10-CM | POA: Diagnosis not present

## 2021-05-06 DIAGNOSIS — R0602 Shortness of breath: Secondary | ICD-10-CM | POA: Diagnosis present

## 2021-05-06 DIAGNOSIS — Z7982 Long term (current) use of aspirin: Secondary | ICD-10-CM | POA: Diagnosis not present

## 2021-05-06 DIAGNOSIS — E871 Hypo-osmolality and hyponatremia: Secondary | ICD-10-CM | POA: Diagnosis not present

## 2021-05-06 DIAGNOSIS — N189 Chronic kidney disease, unspecified: Secondary | ICD-10-CM | POA: Diagnosis not present

## 2021-05-06 NOTE — Patient Instructions (Addendum)
Begin weighing daily and call for an overnight weight gain of > 2 pounds or a weekly weight gain of >5 pounds. 

## 2021-05-16 ENCOUNTER — Emergency Department
Admission: EM | Admit: 2021-05-16 | Discharge: 2021-05-16 | Disposition: A | Discharge status: Home / Self Care | Payer: Medicare HMO | Attending: Emergency Medicine | Admitting: Emergency Medicine

## 2021-05-16 ENCOUNTER — Emergency Department: Payer: Medicare HMO

## 2021-05-16 ENCOUNTER — Other Ambulatory Visit: Payer: Self-pay

## 2021-05-16 DIAGNOSIS — Z7982 Long term (current) use of aspirin: Secondary | ICD-10-CM | POA: Insufficient documentation

## 2021-05-16 DIAGNOSIS — I5033 Acute on chronic diastolic (congestive) heart failure: Secondary | ICD-10-CM | POA: Diagnosis not present

## 2021-05-16 DIAGNOSIS — Z79899 Other long term (current) drug therapy: Secondary | ICD-10-CM | POA: Insufficient documentation

## 2021-05-16 DIAGNOSIS — I2510 Atherosclerotic heart disease of native coronary artery without angina pectoris: Secondary | ICD-10-CM | POA: Diagnosis not present

## 2021-05-16 DIAGNOSIS — W182XXA Fall in (into) shower or empty bathtub, initial encounter: Secondary | ICD-10-CM | POA: Diagnosis not present

## 2021-05-16 DIAGNOSIS — M25511 Pain in right shoulder: Secondary | ICD-10-CM | POA: Diagnosis not present

## 2021-05-16 DIAGNOSIS — N184 Chronic kidney disease, stage 4 (severe): Secondary | ICD-10-CM | POA: Diagnosis not present

## 2021-05-16 DIAGNOSIS — I130 Hypertensive heart and chronic kidney disease with heart failure and stage 1 through stage 4 chronic kidney disease, or unspecified chronic kidney disease: Secondary | ICD-10-CM | POA: Diagnosis not present

## 2021-05-16 DIAGNOSIS — W19XXXA Unspecified fall, initial encounter: Secondary | ICD-10-CM

## 2021-05-16 DIAGNOSIS — S0990XA Unspecified injury of head, initial encounter: Secondary | ICD-10-CM | POA: Diagnosis not present

## 2021-05-16 NOTE — ED Provider Notes (Signed)
[facility] Emergency Department Provider Note  ____________________________________________   Event Date/Time   First MD Initiated Contact with Patient 05/16/21 1046     (approximate)  I have reviewed the triage vital signs and the nursing notes.   HISTORY  Chief Complaint Shoulder Pain    HPI Daniel Bolton is a 87 y.o. male presents emergency department after a fall yesterday.  Patient states he was in the shower when he slipped and fell landing on the right shoulder.  Patient is denying any blood thinners but states he does take an aspirin a day.  Denies chest pain or shortness of breath.  No headache.  Past Medical History:  Diagnosis Date   CHF (congestive heart failure) ([place])    Chronic kidney disease    Hypertension    Hyponatremia    Stroke [facility])     Patient Active Problem List   Diagnosis Date Noted   Protein-calorie malnutrition, severe 04/12/2021   Essential hypertension 03/22/2021   Acute on chronic diastolic CHF (congestive heart failure) ([place]) 03/22/2021   Hyponatremia 03/22/2021   Acute on chronic systolic CHF (congestive heart failure) ([place]) 03/11/2021   Hypertensive urgency 03/11/2021   CKD (chronic kidney disease), stage IV ([place]) 03/11/2021   Normochromic normocytic anemia 03/11/2021   Acute CHF (congestive heart failure) ([place]) 03/11/2021   Moderate major depression, single episode ([place]) 08/01/2015   Coronary artery disease 08/01/2015   Financial problems 08/01/2015    Past Surgical History:  Procedure Laterality Date   BRAIN SURGERY     CARDIAC SURGERY      Prior to Admission medications   Medication Sig Start Date End Date Taking? Authorizing Provider  albuterol (VENTOLIN HFA) 108 (90 Base) MCG/ACT inhaler Inhale 2 puffs into the lungs every 6 (six) hours as needed for wheezing or shortness of breath.    [provider]  aspirin EC 81 MG EC tablet Take 1 tablet (81 mg total) by mouth daily. Swallow  whole. 03/14/21   Danford, Suann Larry, MD  clopidogrel (PLAVIX) 75 MG tablet Take 75 mg by mouth daily.    [provider]  feeding supplement (ENSURE ENLIVE / ENSURE PLUS) LIQD Take 237 mLs by mouth 2 (two) times daily between meals. 04/14/21   Enzo Bi, MD  furosemide (LASIX) 40 MG tablet Take 1 tablet (40 mg total) by mouth daily. 04/14/21 07/13/21  Enzo Bi, MD  metoprolol succinate (TOPROL-XL) 50 MG 24 hr tablet Take 50 mg by mouth daily. Take with or immediately following a meal.    [provider]  olmesartan (BENICAR) 20 MG tablet Take 20 mg by mouth daily.    [provider]  pantoprazole (PROTONIX) 40 MG tablet Take 1 tablet (40 mg total) by mouth daily. Patient not taking: Reported on 05/06/2021 03/26/21   Sharen Hones, MD  simvastatin (ZOCOR) 80 MG tablet Take 80 mg by mouth daily.    [provider]    Allergies Hydralazine  No family history on file.  Social History Social History   Tobacco Use   Smoking status: Never   Smokeless tobacco: Never  Substance Use Topics   Alcohol use: No   Drug use: No    Review of Systems  Constitutional: No fever/chills Eyes: No visual changes. ENT: No sore throat. Respiratory: Denies cough Cardiovascular: Denies chest pain Gastrointestinal: Denies abdominal pain Genitourinary: Negative for dysuria. Musculoskeletal: Negative for back pain. Skin: Negative for rash. Psychiatric: no mood changes,     ____________________________________________  PHYSICAL EXAM:  VITAL SIGNS: ED Triage Vitals  Enc Vitals Group     BP 05/16/21 0958 (!) 160/58     Pulse Rate 05/16/21 0958 (!) 55     Resp 05/16/21 0958 18     Temp 05/16/21 0958 97.6 F (36.4 C)     Temp Source 05/16/21 0958 Oral     SpO2 05/16/21 0958 100 %     Weight --      Height --      Head Circumference --      Peak Flow --      Pain Score 05/16/21 0946 7     Pain Loc --      Pain Edu? --      Excl. in [place]? --      Constitutional: Alert and oriented. Well appearing and in no acute distress. Eyes: Conjunctivae are normal.  Head: Atraumatic. Nose: No congestion/rhinnorhea. Mouth/Throat: Mucous membranes are moist.   Neck:  supple no lymphadenopathy noted Cardiovascular: Normal rate, regular rhythm. Heart sounds are normal Respiratory: Normal respiratory effort.  No retractions, lungs c t a  GU: deferred Musculoskeletal: FROM all extremities, warm and well perfused, right shoulder is tender at the ACJ and joint, Neurologic:  Normal speech and language.  Skin:  Skin is warm, dry and intact. No rash noted. Psychiatric: Mood and affect are normal. Speech and behavior are normal.  ____________________________________________   LABS (all labs ordered are listed, but only abnormal results are displayed)  Labs Reviewed - No data to display ____________________________________________   ____________________________________________  RADIOLOGY  CT of the head and C-spine X-ray right shoulder  ____________________________________________   PROCEDURES  Procedure(s) performed: No  Procedures    ____________________________________________   INITIAL IMPRESSION / ASSESSMENT AND PLAN / ED COURSE  Pertinent labs & imaging results that were available during my care of the patient were reviewed by me and considered in my medical decision making (see chart for details).   Patient is an 87 year old male presents emergency department after a fall yesterday.  See HPI.  Physical exam shows patient appears stable  CT of the head and C-spine, x-ray of the right shoulder  CT of the head and C-spine are negative for any acute abnormalities X-ray of the right shoulder reviewed by me confirmed by radiology to be negative for any acute abnormality  Explain all of the findings via the [facility] interpreter.  Patient is to take Tylenol for pain as needed.  Apply ice.  Return emergency department if  worsening.  He was discharged stable condition.     Daniel Bolton was evaluated in Emergency Department on 05/16/2021 for the symptoms described in the history of present illness. He was evaluated in the context of the global COVID-19 pandemic, which necessitated consideration that the patient might be at risk for infection with the SARS-CoV-2 virus that causes COVID-19. Institutional protocols and algorithms that pertain to the evaluation of patients at risk for COVID-19 are in a state of rapid change based on information released by regulatory bodies including the CDC and federal and state organizations. These policies and algorithms were followed during the patient's care in the ED.    As part of my medical decision making, I reviewed the following data within the [place] notes reviewed and incorporated, Old chart reviewed, Radiograph reviewed , Notes from prior ED visits, and [place] Controlled Substance Database  ____________________________________________   FINAL CLINICAL IMPRESSION(S) / ED DIAGNOSES  Final diagnoses:  Fall, initial  encounter  Acute pain of right shoulder      NEW MEDICATIONS STARTED DURING THIS VISIT:  Discharge Medication List as of 05/16/2021 12:41 PM       Note:  This document was prepared using Dragon voice recognition software and may include unintentional dictation errors.    Versie Starks, PA-C 05/16/21 1344    Harvest Dark, MD 05/16/21 1410

## 2021-05-16 NOTE — Discharge Instructions (Addendum)
Follow up with your regular doctor if not improving in 2 to 3 days Return to the ER if worsening Apply ice to the right shoulder

## 2021-05-16 NOTE — ED Triage Notes (Signed)
Pt comes with c/o slip and fall in shower and injured right shoulder. Pt denies any LOC or blood thinners.

## 2021-06-03 ENCOUNTER — Emergency Department: Payer: Medicare Other

## 2021-06-03 ENCOUNTER — Other Ambulatory Visit: Payer: Self-pay

## 2021-06-03 DIAGNOSIS — R778 Other specified abnormalities of plasma proteins: Secondary | ICD-10-CM | POA: Diagnosis not present

## 2021-06-03 DIAGNOSIS — K5900 Constipation, unspecified: Secondary | ICD-10-CM | POA: Diagnosis present

## 2021-06-03 DIAGNOSIS — R001 Bradycardia, unspecified: Secondary | ICD-10-CM | POA: Diagnosis not present

## 2021-06-03 DIAGNOSIS — I714 Abdominal aortic aneurysm, without rupture: Secondary | ICD-10-CM | POA: Diagnosis not present

## 2021-06-03 DIAGNOSIS — Z8673 Personal history of transient ischemic attack (TIA), and cerebral infarction without residual deficits: Secondary | ICD-10-CM | POA: Diagnosis not present

## 2021-06-03 DIAGNOSIS — Z951 Presence of aortocoronary bypass graft: Secondary | ICD-10-CM | POA: Diagnosis not present

## 2021-06-03 DIAGNOSIS — I2510 Atherosclerotic heart disease of native coronary artery without angina pectoris: Secondary | ICD-10-CM | POA: Diagnosis not present

## 2021-06-03 DIAGNOSIS — Z7902 Long term (current) use of antithrombotics/antiplatelets: Secondary | ICD-10-CM

## 2021-06-03 DIAGNOSIS — Z8249 Family history of ischemic heart disease and other diseases of the circulatory system: Secondary | ICD-10-CM | POA: Diagnosis not present

## 2021-06-03 DIAGNOSIS — N179 Acute kidney failure, unspecified: Secondary | ICD-10-CM | POA: Diagnosis present

## 2021-06-03 DIAGNOSIS — M25552 Pain in left hip: Secondary | ICD-10-CM | POA: Diagnosis not present

## 2021-06-03 DIAGNOSIS — I5043 Acute on chronic combined systolic (congestive) and diastolic (congestive) heart failure: Secondary | ICD-10-CM | POA: Diagnosis not present

## 2021-06-03 DIAGNOSIS — E875 Hyperkalemia: Secondary | ICD-10-CM | POA: Diagnosis not present

## 2021-06-03 DIAGNOSIS — K219 Gastro-esophageal reflux disease without esophagitis: Secondary | ICD-10-CM | POA: Diagnosis present

## 2021-06-03 DIAGNOSIS — I161 Hypertensive emergency: Secondary | ICD-10-CM | POA: Diagnosis present

## 2021-06-03 DIAGNOSIS — Z7982 Long term (current) use of aspirin: Secondary | ICD-10-CM | POA: Diagnosis not present

## 2021-06-03 DIAGNOSIS — Z79899 Other long term (current) drug therapy: Secondary | ICD-10-CM | POA: Diagnosis not present

## 2021-06-03 DIAGNOSIS — E871 Hypo-osmolality and hyponatremia: Secondary | ICD-10-CM | POA: Diagnosis present

## 2021-06-03 DIAGNOSIS — I130 Hypertensive heart and chronic kidney disease with heart failure and stage 1 through stage 4 chronic kidney disease, or unspecified chronic kidney disease: Secondary | ICD-10-CM | POA: Diagnosis not present

## 2021-06-03 DIAGNOSIS — D631 Anemia in chronic kidney disease: Secondary | ICD-10-CM | POA: Diagnosis not present

## 2021-06-03 DIAGNOSIS — M545 Low back pain, unspecified: Secondary | ICD-10-CM | POA: Diagnosis present

## 2021-06-03 DIAGNOSIS — Z20822 Contact with and (suspected) exposure to covid-19: Secondary | ICD-10-CM | POA: Diagnosis not present

## 2021-06-03 DIAGNOSIS — N1832 Chronic kidney disease, stage 3b: Secondary | ICD-10-CM | POA: Diagnosis present

## 2021-06-03 DIAGNOSIS — E785 Hyperlipidemia, unspecified: Secondary | ICD-10-CM | POA: Diagnosis present

## 2021-06-03 LAB — CBC
HCT: 31.9 % — ABNORMAL LOW (ref 39.0–52.0)
Hemoglobin: 10.8 g/dL — ABNORMAL LOW (ref 13.0–17.0)
MCH: 29.3 pg (ref 26.0–34.0)
MCHC: 33.9 g/dL (ref 30.0–36.0)
MCV: 86.4 fL (ref 80.0–100.0)
Platelets: 148 10*3/uL — ABNORMAL LOW (ref 150–400)
RBC: 3.69 MIL/uL — ABNORMAL LOW (ref 4.22–5.81)
RDW: 16.4 % — ABNORMAL HIGH (ref 11.5–15.5)
WBC: 7.7 10*3/uL (ref 4.0–10.5)
nRBC: 0 % (ref 0.0–0.2)

## 2021-06-03 LAB — COMPREHENSIVE METABOLIC PANEL
ALT: 38 U/L (ref 0–44)
AST: 40 U/L (ref 15–41)
Albumin: 3.3 g/dL — ABNORMAL LOW (ref 3.5–5.0)
Alkaline Phosphatase: 134 U/L — ABNORMAL HIGH (ref 38–126)
Anion gap: 8 (ref 5–15)
BUN: 69 mg/dL — ABNORMAL HIGH (ref 8–23)
CO2: 23 mmol/L (ref 22–32)
Calcium: 8.4 mg/dL — ABNORMAL LOW (ref 8.9–10.3)
Chloride: 92 mmol/L — ABNORMAL LOW (ref 98–111)
Creatinine, Ser: 2.08 mg/dL — ABNORMAL HIGH (ref 0.61–1.24)
GFR, Estimated: 31 mL/min — ABNORMAL LOW (ref >60)
Glucose, Bld: 107 mg/dL — ABNORMAL HIGH (ref 70–99)
Potassium: 5.3 mmol/L — ABNORMAL HIGH (ref 3.5–5.1)
Sodium: 123 mmol/L — ABNORMAL LOW (ref 135–145)
Total Bilirubin: 1 mg/dL (ref 0.3–1.2)
Total Protein: 6.8 g/dL (ref 6.5–8.1)

## 2021-06-03 LAB — TROPONIN I (HIGH SENSITIVITY): Troponin I (High Sensitivity): 22 ng/L — ABNORMAL HIGH (ref <18)

## 2021-06-03 LAB — LIPASE, BLOOD: Lipase: 39 U/L (ref 11–51)

## 2021-06-03 NOTE — ED Triage Notes (Signed)
EMS brings pt in from home for c/o sciatica pain

## 2021-06-03 NOTE — ED Triage Notes (Addendum)
Pt in with co abd distention for 3-4 days, states has not had normal BM in about 3 days. Pt co nausea, no vomiting or diarrhea. Pt also co left hip pain that radiates down left leg for around 3 weeks. Denies any injury. States pain is when he walk or moves. Pt also co shob on exertion.

## 2021-06-04 ENCOUNTER — Emergency Department: Payer: Medicare Other

## 2021-06-04 ENCOUNTER — Inpatient Hospital Stay
Admission: EM | Admit: 2021-06-04 | Discharge: 2021-06-06 | DRG: 291 | Disposition: A | Discharge status: Home Health | Payer: Medicare Other | Attending: Internal Medicine | Admitting: Internal Medicine

## 2021-06-04 ENCOUNTER — Encounter: Payer: Self-pay | Admitting: Internal Medicine

## 2021-06-04 ENCOUNTER — Other Ambulatory Visit: Payer: Self-pay

## 2021-06-04 DIAGNOSIS — E875 Hyperkalemia: Secondary | ICD-10-CM | POA: Diagnosis not present

## 2021-06-04 DIAGNOSIS — I639 Cerebral infarction, unspecified: Secondary | ICD-10-CM | POA: Diagnosis present

## 2021-06-04 DIAGNOSIS — K5900 Constipation, unspecified: Secondary | ICD-10-CM | POA: Diagnosis not present

## 2021-06-04 DIAGNOSIS — I160 Hypertensive urgency: Secondary | ICD-10-CM | POA: Diagnosis not present

## 2021-06-04 DIAGNOSIS — Z8673 Personal history of transient ischemic attack (TIA), and cerebral infarction without residual deficits: Secondary | ICD-10-CM | POA: Diagnosis not present

## 2021-06-04 DIAGNOSIS — N179 Acute kidney failure, unspecified: Secondary | ICD-10-CM | POA: Diagnosis not present

## 2021-06-04 DIAGNOSIS — M25552 Pain in left hip: Secondary | ICD-10-CM

## 2021-06-04 DIAGNOSIS — Z8249 Family history of ischemic heart disease and other diseases of the circulatory system: Secondary | ICD-10-CM | POA: Diagnosis not present

## 2021-06-04 DIAGNOSIS — E871 Hypo-osmolality and hyponatremia: Secondary | ICD-10-CM

## 2021-06-04 DIAGNOSIS — I2510 Atherosclerotic heart disease of native coronary artery without angina pectoris: Secondary | ICD-10-CM | POA: Diagnosis present

## 2021-06-04 DIAGNOSIS — E785 Hyperlipidemia, unspecified: Secondary | ICD-10-CM | POA: Diagnosis not present

## 2021-06-04 DIAGNOSIS — I714 Abdominal aortic aneurysm, without rupture, unspecified: Secondary | ICD-10-CM

## 2021-06-04 DIAGNOSIS — R0602 Shortness of breath: Secondary | ICD-10-CM

## 2021-06-04 DIAGNOSIS — R109 Unspecified abdominal pain: Secondary | ICD-10-CM | POA: Diagnosis present

## 2021-06-04 DIAGNOSIS — R1084 Generalized abdominal pain: Secondary | ICD-10-CM | POA: Diagnosis not present

## 2021-06-04 DIAGNOSIS — I130 Hypertensive heart and chronic kidney disease with heart failure and stage 1 through stage 4 chronic kidney disease, or unspecified chronic kidney disease: Secondary | ICD-10-CM | POA: Diagnosis not present

## 2021-06-04 DIAGNOSIS — Z7982 Long term (current) use of aspirin: Secondary | ICD-10-CM | POA: Diagnosis not present

## 2021-06-04 DIAGNOSIS — I5033 Acute on chronic diastolic (congestive) heart failure: Secondary | ICD-10-CM

## 2021-06-04 DIAGNOSIS — K219 Gastro-esophageal reflux disease without esophagitis: Secondary | ICD-10-CM | POA: Diagnosis not present

## 2021-06-04 DIAGNOSIS — I10 Essential (primary) hypertension: Secondary | ICD-10-CM

## 2021-06-04 DIAGNOSIS — R001 Bradycardia, unspecified: Secondary | ICD-10-CM | POA: Diagnosis not present

## 2021-06-04 DIAGNOSIS — Z79899 Other long term (current) drug therapy: Secondary | ICD-10-CM | POA: Diagnosis not present

## 2021-06-04 DIAGNOSIS — I161 Hypertensive emergency: Secondary | ICD-10-CM | POA: Diagnosis not present

## 2021-06-04 DIAGNOSIS — Z7902 Long term (current) use of antithrombotics/antiplatelets: Secondary | ICD-10-CM | POA: Diagnosis not present

## 2021-06-04 DIAGNOSIS — R778 Other specified abnormalities of plasma proteins: Secondary | ICD-10-CM | POA: Diagnosis present

## 2021-06-04 DIAGNOSIS — D631 Anemia in chronic kidney disease: Secondary | ICD-10-CM | POA: Diagnosis not present

## 2021-06-04 DIAGNOSIS — N1832 Chronic kidney disease, stage 3b: Secondary | ICD-10-CM | POA: Diagnosis not present

## 2021-06-04 DIAGNOSIS — I5043 Acute on chronic combined systolic (congestive) and diastolic (congestive) heart failure: Secondary | ICD-10-CM | POA: Diagnosis not present

## 2021-06-04 DIAGNOSIS — D649 Anemia, unspecified: Secondary | ICD-10-CM | POA: Diagnosis present

## 2021-06-04 DIAGNOSIS — Z951 Presence of aortocoronary bypass graft: Secondary | ICD-10-CM | POA: Diagnosis not present

## 2021-06-04 DIAGNOSIS — N184 Chronic kidney disease, stage 4 (severe): Secondary | ICD-10-CM | POA: Diagnosis present

## 2021-06-04 DIAGNOSIS — R7989 Other specified abnormal findings of blood chemistry: Secondary | ICD-10-CM | POA: Diagnosis present

## 2021-06-04 DIAGNOSIS — M545 Low back pain, unspecified: Secondary | ICD-10-CM | POA: Diagnosis not present

## 2021-06-04 DIAGNOSIS — Z20822 Contact with and (suspected) exposure to covid-19: Secondary | ICD-10-CM | POA: Diagnosis not present

## 2021-06-04 DIAGNOSIS — E878 Other disorders of electrolyte and fluid balance, not elsewhere classified: Secondary | ICD-10-CM | POA: Diagnosis present

## 2021-06-04 LAB — BASIC METABOLIC PANEL
Anion gap: 8 (ref 5–15)
Anion gap: 7 (ref 5–15)
BUN: 61 mg/dL — ABNORMAL HIGH (ref 8–23)
BUN: 69 mg/dL — ABNORMAL HIGH (ref 8–23)
CO2: 23 mmol/L (ref 22–32)
CO2: 26 mmol/L (ref 22–32)
Calcium: 7.4 mg/dL — ABNORMAL LOW (ref 8.9–10.3)
Calcium: 8.3 mg/dL — ABNORMAL LOW (ref 8.9–10.3)
Chloride: 98 mmol/L (ref 98–111)
Chloride: 94 mmol/L — ABNORMAL LOW (ref 98–111)
Creatinine, Ser: 1.85 mg/dL — ABNORMAL HIGH (ref 0.61–1.24)
Creatinine, Ser: 2.02 mg/dL — ABNORMAL HIGH (ref 0.61–1.24)
GFR, Estimated: 35 mL/min — ABNORMAL LOW (ref >60)
GFR, Estimated: 32 mL/min — ABNORMAL LOW (ref >60)
Glucose, Bld: 77 mg/dL (ref 70–99)
Glucose, Bld: 119 mg/dL — ABNORMAL HIGH (ref 70–99)
Potassium: 4.3 mmol/L (ref 3.5–5.1)
Potassium: 4.3 mmol/L (ref 3.5–5.1)
Sodium: 129 mmol/L — ABNORMAL LOW (ref 135–145)
Sodium: 127 mmol/L — ABNORMAL LOW (ref 135–145)

## 2021-06-04 LAB — URINALYSIS, COMPLETE (UACMP) WITH MICROSCOPIC
Bacteria, UA: NONE SEEN
Bilirubin Urine: NEGATIVE
Glucose, UA: NEGATIVE mg/dL
Hgb urine dipstick: NEGATIVE
Ketones, ur: NEGATIVE mg/dL
Leukocytes,Ua: NEGATIVE
Nitrite: NEGATIVE
Protein, ur: 30 mg/dL — AB
Specific Gravity, Urine: 1.008 (ref 1.005–1.030)
Squamous Epithelial / HPF: NONE SEEN (ref 0–5)
WBC, UA: NONE SEEN WBC/hpf (ref 0–5)
pH: 6 (ref 5.0–8.0)

## 2021-06-04 LAB — SODIUM, URINE, RANDOM: Sodium, Ur: 13 mmol/L

## 2021-06-04 LAB — TROPONIN I (HIGH SENSITIVITY)
Troponin I (High Sensitivity): 22 ng/L — ABNORMAL HIGH (ref <18)
Troponin I (High Sensitivity): 20 ng/L — ABNORMAL HIGH (ref <18)

## 2021-06-04 LAB — BRAIN NATRIURETIC PEPTIDE: B Natriuretic Peptide: 3266 pg/mL — ABNORMAL HIGH (ref 0.0–100.0)

## 2021-06-04 LAB — OSMOLALITY: Osmolality: 284 mOsm/kg (ref 275–295)

## 2021-06-04 LAB — RESP PANEL BY RT-PCR (FLU A&B, COVID) ARPGX2
Influenza A by PCR: NEGATIVE
Influenza B by PCR: NEGATIVE
SARS Coronavirus 2 by RT PCR: NEGATIVE

## 2021-06-04 LAB — OSMOLALITY, URINE: Osmolality, Ur: 273 mOsm/kg — ABNORMAL LOW (ref 300–900)

## 2021-06-04 MED ORDER — ENOXAPARIN SODIUM 30 MG/0.3ML IJ SOSY
30.0000 mg | PREFILLED_SYRINGE | INTRAMUSCULAR | Status: DC
  Administered 2021-06-04 – 2021-06-06 (×3): 30 mg via SUBCUTANEOUS
  Filled 2021-06-04 (×3): qty 0.3

## 2021-06-04 MED ORDER — ENALAPRILAT 1.25 MG/ML IV SOLN
0.6250 mg | Freq: Four times a day (QID) | INTRAVENOUS | Status: DC | PRN
  Filled 2021-06-04: qty 0.5

## 2021-06-04 MED ORDER — ATORVASTATIN CALCIUM 20 MG PO TABS
40.0000 mg | ORAL_TABLET | Freq: Every day | ORAL | Status: DC
  Administered 2021-06-04 – 2021-06-06 (×3): 40 mg via ORAL
  Filled 2021-06-04 (×3): qty 2

## 2021-06-04 MED ORDER — SENNOSIDES-DOCUSATE SODIUM 8.6-50 MG PO TABS
1.0000 | ORAL_TABLET | Freq: Two times a day (BID) | ORAL | Status: DC
  Administered 2021-06-04 – 2021-06-06 (×5): 1 via ORAL
  Filled 2021-06-04 (×5): qty 1

## 2021-06-04 MED ORDER — AMLODIPINE BESYLATE 10 MG PO TABS
10.0000 mg | ORAL_TABLET | Freq: Every day | ORAL | Status: DC
  Administered 2021-06-04 – 2021-06-06 (×3): 10 mg via ORAL
  Filled 2021-06-04 (×2): qty 1
  Filled 2021-06-04: qty 2

## 2021-06-04 MED ORDER — POLYETHYLENE GLYCOL 3350 17 G PO PACK
17.0000 g | PACK | Freq: Every day | ORAL | Status: DC
  Administered 2021-06-04 – 2021-06-06 (×3): 17 g via ORAL
  Filled 2021-06-04 (×3): qty 1

## 2021-06-04 MED ORDER — ALBUTEROL SULFATE (2.5 MG/3ML) 0.083% IN NEBU: 3.0000 mL | INHALATION_SOLUTION | RESPIRATORY_TRACT | Status: DC | PRN

## 2021-06-04 MED ORDER — MORPHINE SULFATE (PF) 2 MG/ML IV SOLN: 0.5000 mg | INTRAVENOUS | Status: DC | PRN

## 2021-06-04 MED ORDER — ONDANSETRON HCL 4 MG/2ML IJ SOLN: 4.0000 mg | Freq: Three times a day (TID) | INTRAMUSCULAR | Status: DC | PRN

## 2021-06-04 MED ORDER — ACETAMINOPHEN 325 MG PO TABS: 650.0000 mg | ORAL_TABLET | Freq: Four times a day (QID) | ORAL | Status: DC | PRN

## 2021-06-04 MED ORDER — ENSURE ENLIVE PO LIQD
237.0000 mL | Freq: Two times a day (BID) | ORAL | Status: DC
  Administered 2021-06-05 (×2): 237 mL via ORAL

## 2021-06-04 MED ORDER — ASPIRIN EC 81 MG PO TBEC
81.0000 mg | DELAYED_RELEASE_TABLET | Freq: Every day | ORAL | Status: DC
  Administered 2021-06-04 – 2021-06-06 (×3): 81 mg via ORAL
  Filled 2021-06-04 (×3): qty 1

## 2021-06-04 MED ORDER — FUROSEMIDE 10 MG/ML IJ SOLN
40.0000 mg | Freq: Two times a day (BID) | INTRAMUSCULAR | Status: DC
  Administered 2021-06-04 – 2021-06-06 (×5): 40 mg via INTRAVENOUS
  Filled 2021-06-04 (×5): qty 4

## 2021-06-04 MED ORDER — NITROGLYCERIN 0.4 MG SL SUBL: 0.4000 mg | SUBLINGUAL_TABLET | SUBLINGUAL | Status: DC | PRN

## 2021-06-04 MED ORDER — CLONIDINE HCL 0.1 MG PO TABS
0.2000 mg | ORAL_TABLET | Freq: Once | ORAL | Status: AC
  Administered 2021-06-04: 0.2 mg via ORAL
  Filled 2021-06-04: qty 2

## 2021-06-04 MED ORDER — SODIUM CHLORIDE 1 G PO TABS
1.0000 g | ORAL_TABLET | Freq: Two times a day (BID) | ORAL | Status: DC
  Administered 2021-06-04 – 2021-06-06 (×5): 1 g via ORAL
  Filled 2021-06-04 (×7): qty 1

## 2021-06-04 NOTE — ED Provider Notes (Signed)
[facility] Emergency Department Provider Note ____________________________________________   Event Date/Time   First MD Initiated Contact with Patient 06/04/21 [phone]     (approximate)  I have reviewed the triage vital signs and the nursing notes.   HISTORY  Chief Complaint Abdominal Pain    HPI Daniel Bolton is a 87 y.o. male with PMH as noted below including CHF, CKD, hypertension, and hyponatremia who presents with constipation over the last 3 to 4 days, associated with worsening abdominal distention and intermittent pain.  He reports nausea but no vomiting.  He denies any fever or chills or any urinary symptoms.  He also has had some left hip pain which has made it difficult for him to walk but denies any specific trauma.  He does endorse shortness of breath and bilateral leg swelling.  Past Medical History:  Diagnosis Date   CHF (congestive heart failure) ([place])    Chronic kidney disease    Hypertension    Hyponatremia    Stroke [facility])     Patient Active Problem List   Diagnosis Date Noted   Protein-calorie malnutrition, severe 04/12/2021   Essential hypertension 03/22/2021   Acute on chronic diastolic CHF (congestive heart failure) ([place]) 03/22/2021   Hyponatremia 03/22/2021   Acute on chronic systolic CHF (congestive heart failure) ([place]) 03/11/2021   Hypertensive urgency 03/11/2021   CKD (chronic kidney disease), stage IV ([place]) 03/11/2021   Normochromic normocytic anemia 03/11/2021   Acute CHF (congestive heart failure) ([place]) 03/11/2021   Moderate major depression, single episode ([place]) 08/01/2015   Coronary artery disease 08/01/2015   Financial problems 08/01/2015    Past Surgical History:  Procedure Laterality Date   BRAIN SURGERY     CARDIAC SURGERY      Prior to Admission medications   Medication Sig Start Date End Date Taking? Authorizing Provider  albuterol (VENTOLIN HFA) 108 (90 Base) MCG/ACT inhaler Inhale 2 puffs into the  lungs every 6 (six) hours as needed for wheezing or shortness of breath.    [provider]  aspirin EC 81 MG EC tablet Take 1 tablet (81 mg total) by mouth daily. Swallow whole. 03/14/21   Danford, Suann Larry, MD  clopidogrel (PLAVIX) 75 MG tablet Take 75 mg by mouth daily.    [provider]  feeding supplement (ENSURE ENLIVE / ENSURE PLUS) LIQD Take 237 mLs by mouth 2 (two) times daily between meals. 04/14/21   Enzo Bi, MD  furosemide (LASIX) 40 MG tablet Take 1 tablet (40 mg total) by mouth daily. 04/14/21 07/13/21  Enzo Bi, MD  metoprolol succinate (TOPROL-XL) 50 MG 24 hr tablet Take 50 mg by mouth daily. Take with or immediately following a meal.    [provider]  olmesartan (BENICAR) 20 MG tablet Take 20 mg by mouth daily.    [provider]  pantoprazole (PROTONIX) 40 MG tablet Take 1 tablet (40 mg total) by mouth daily. Patient not taking: Reported on 05/06/2021 03/26/21   Sharen Hones, MD  simvastatin (ZOCOR) 80 MG tablet Take 80 mg by mouth daily.    [provider]    Allergies Hydralazine  No family history on file.  Social History Social History   Tobacco Use   Smoking status: Never   Smokeless tobacco: Never  Substance Use Topics   Alcohol use: No   Drug use: No    Review of Systems  Constitutional: No fever. Eyes: No redness. ENT: No sore throat. Cardiovascular: Denies chest pain. Respiratory: Positive  for shortness of breath. Gastrointestinal: Positive for nausea and constipation.   Genitourinary: Negative for dysuria.  Musculoskeletal: Negative for back pain. Skin: Negative for rash. Neurological: Negative for headache.   ____________________________________________   PHYSICAL EXAM:  VITAL SIGNS: ED Triage Vitals [06/03/21 2302]  Enc Vitals Group     BP (!) 189/75     Pulse Rate (!) 53     Resp 20     Temp 98.2 F (36.8 C)     Temp Source Oral     SpO2 100 %     Weight 134 lb 7.7 oz (61 kg)      Height      Head Circumference      Peak Flow      Pain Score 10     Pain Loc      Pain Edu?      Excl. in [place]?     Constitutional: Alert and oriented. Well appearing and in no acute distress. Eyes: Conjunctivae are normal.  Head: Atraumatic. Nose: No congestion/rhinnorhea. Mouth/Throat: Mucous membranes are moist.   Neck: Normal range of motion.  Cardiovascular: Normal rate, regular rhythm. Grossly normal heart sounds.  Good peripheral circulation. Respiratory: Normal respiratory effort.  No retractions. Lungs CTAB. Gastrointestinal: Soft and nontender. No distention.  Genitourinary: No flank tenderness. Musculoskeletal: 1+ bilateral lower extremity edema.  Extremities warm and well perfused.  Neurologic:  Normal speech and language. No gross focal neurologic deficits are appreciated.  Skin:  Skin is warm and dry. No rash noted. Psychiatric: Mood and affect are normal. Speech and behavior are normal.  ____________________________________________   LABS (all labs ordered are listed, but only abnormal results are displayed)  Labs Reviewed  CBC - Abnormal; Notable for the following components:      Result Value   RBC 3.69 (*)    Hemoglobin 10.8 (*)    HCT 31.9 (*)    RDW 16.4 (*)    Platelets 148 (*)    All other components within normal limits  COMPREHENSIVE METABOLIC PANEL - Abnormal; Notable for the following components:   Sodium 123 (*)    Potassium 5.3 (*)    Chloride 92 (*)    Glucose, Bld 107 (*)    BUN 69 (*)    Creatinine, Ser 2.08 (*)    Calcium 8.4 (*)    Albumin 3.3 (*)    Alkaline Phosphatase 134 (*)    GFR, Estimated 31 (*)    All other components within normal limits  URINALYSIS, COMPLETE (UACMP) WITH MICROSCOPIC - Abnormal; Notable for the following components:   Color, Urine YELLOW (*)    APPearance CLEAR (*)    Protein, ur 30 (*)    All other components within normal limits  BRAIN NATRIURETIC PEPTIDE - Abnormal; Notable for the following  components:   B Natriuretic Peptide 3,266.0 (*)    All other components within normal limits  TROPONIN I (HIGH SENSITIVITY) - Abnormal; Notable for the following components:   Troponin I (High Sensitivity) 22 (*)    All other components within normal limits  TROPONIN I (HIGH SENSITIVITY) - Abnormal; Notable for the following components:   Troponin I (High Sensitivity) 22 (*)    All other components within normal limits  RESP PANEL BY RT-PCR (FLU A&B, COVID) ARPGX2  LIPASE, BLOOD   ____________________________________________  EKG  ED ECG REPORT I, Arta Silence, the attending physician, personally viewed and interpreted this ECG.  Date: 06/04/2021 EKG Time: 2311 Rate: 55 Rhythm: Sinus bradycardia with  PVCs QRS Axis: normal Intervals: normal ST/T Wave abnormalities: Nonspecific ST abnormalities Narrative Interpretation: no evidence of acute ischemia  ____________________________________________  RADIOLOGY  Chest x-ray interpreted by me shows cardiomegaly and mild edema.  Abdominal x-ray interpreted by me shows normal bowel gas pattern.  XR L hip: Pending  CT abdomen/pelvis: Pending  ____________________________________________   PROCEDURES  Procedure(s) performed: No  Procedures  Critical Care performed: No ____________________________________________   INITIAL IMPRESSION / ASSESSMENT AND PLAN / ED COURSE  Pertinent labs & imaging results that were available during my care of the patient were reviewed by me and considered in my medical decision making (see chart for details).   87 year old male with PMH as noted above including CHF, CKD, hypertension, and hyponatremia presents primarily for constipation and abdominal distention, but also reports left hip pain and increased shortness of breath and leg swelling.  I reviewed the past medical records in [place].  The patient was most recently admitted last month with acute on chronic CHF as well as hyponatremia  to 113 thought to be due to hypervolemia.  Previously he was admitted in June also with acute on chronic CHF and hyponatremia.  On exam currently, the patient is well-appearing for his age.  He is significantly hypertensive but vital signs are otherwise normal.  He has some mild peripheral edema.  The abdomen is soft without focal tenderness.  Initial lab work-up obtained from triage shows several concerning findings including hyponatremia to 123.  This is worse than his recent baseline although he was previously admitted with a sodium as low as 113.  His creatinine is also elevated from baseline.  Urinalysis is clear.  Troponin is not significantly elevated.  1.  Constipation: This is most consistent with simple constipation although given the nausea and abdominal distention, differential also includes SBO or less likely volvulus, versus other causes of abdominal bloating.  We will obtain a CT for further evaluation.  2.  Left hip pain: This is atraumatic and the patient is able to range the hip.  The LLE is neuro/vascular intact.  We will obtain a left hip x-ray.  3.  Hypertension: The patient has no chest pain, headache, or other symptoms of acute hypertensive crisis.  We will give p.o. clonidine.  4.  Hyponatremia/CHF: Recently the patient's hyponatremia has likely been due to CHF and hypervolemia.  He appears mildly fluid overloaded at this time.  We will give IV Lasix.  Given the hyponatremia and hypertension, we will plan for likely admission.  ----------------------------------------- 7:15 AM on 06/04/2021 -----------------------------------------  I signed the patient out to the oncoming ED physician Dr. Quentin Cornwall.   ____________________________________________   FINAL CLINICAL IMPRESSION(S) / ED DIAGNOSES  Final diagnoses:  Hyponatremia  Hypertension, unspecified type  Constipation, unspecified constipation type  Left hip pain      NEW MEDICATIONS STARTED DURING THIS  VISIT:  New Prescriptions   No medications on file     Note:  This document was prepared using Dragon voice recognition software and may include unintentional dictation errors.    Arta Silence, MD 06/04/21 [phone]

## 2021-06-04 NOTE — Consult Note (Signed)
[facility] Cardiology  CARDIOLOGY CONSULT NOTE  Patient ID: Daniel Bolton MRN: [id number] DOB/AGE: [id number] 85 y.o.  Admit date: 06/04/2021 Referring Physician Blaine Hamper Primary Physician [facility] Primary Cardiologist Fath Reason for Consultation acute on chronic systolic congestive heart failure  HPI: 87 year old gentleman referred for evaluation of congestive heart failure.  The patient presents to [facility] ED with chief complaint of rest of shortness of breath, orthopnea and pedal edema.  Chest x-ray revealed mild interstitial edema.  BNP was 3266.  2D echocardiogram 04/10/2021 revealed LVEF 25 to 30%.  Patient was recently hospitalized 03/22/2021 and 04/10/2021 for congestive heart failure.  The patient also complaining of a fullness and constipation.  Abdominal CT revealed abdominal aortic aneurysm 4.4 cm.  Patient also noted to be bradycardic.  ECG revealed sinus bradycardia at 39 bpm with T wave inversions laterally.  The patient is on metoprolol succinate 50 mg daily.  The patient is hemodynamically stable.  The patient has known coronary artery disease, status post CABG x3.  He currently denies chest pain.  High-sensitivity troponin 22, 22, and 20.  Review of systems complete and found to be negative unless listed above     Past Medical History:  Diagnosis Date   CHF (congestive heart failure) ([place])    Chronic kidney disease    Hypertension    Hyponatremia    Stroke [facility])     Past Surgical History:  Procedure Laterality Date   [place]      (Not in a hospital admission)  Social History   Socioeconomic History   Marital status: Married    Spouse name: Not on file   Number of children: Not on file   Years of education: Not on file   Highest education level: Not on file  Occupational History   Not on file  Tobacco Use   Smoking status: Never   Smokeless tobacco: Never  Substance and Sexual Activity   Alcohol use: No   Drug use: No   Sexual activity:  Not on file  Other Topics Concern   Not on file  Social History Narrative   Not on file   Social Determinants of Health   Financial Resource Strain: Not on file  Food Insecurity: Not on file  Transportation Needs: Not on file  Physical Activity: Not on file  Stress: Not on file  Social Connections: Not on file  Intimate Partner Violence: Not on file    Family History  Problem Relation Age of Onset   Cancer Sister    Heart disease Sister       Review of systems complete and found to be negative unless listed above      PHYSICAL EXAM  General: Well developed, well nourished, in no acute distress HEENT:  Normocephalic and atramatic Neck:  No JVD.  Lungs: Clear bilaterally to auscultation and percussion. Heart: HRRR . Normal S1 and S2 without gallops or murmurs.  Abdomen: Bowel sounds are positive, abdomen soft and non-tender  Msk:  Back normal, normal gait. Normal strength and tone for age. Extremities: No clubbing, cyanosis or edema.   Neuro: Alert and oriented X 3. Psych:  Good affect, responds appropriately  Labs:   Lab Results  Component Value Date   WBC 7.7 06/03/2021   HGB 10.8 (L) 06/03/2021   HCT 31.9 (L) 06/03/2021   MCV 86.4 06/03/2021   PLT 148 (L) 06/03/2021    Recent Labs  Lab 06/03/21 2311 06/04/21 0830  NA 123* 129*  K 5.3* 4.3  CL 92* 98  CO2 23 23  BUN 69* 61*  CREATININE 2.08* 1.85*  CALCIUM 8.4* 7.4*  PROT 6.8  --   BILITOT 1.0  --   ALKPHOS 134*  --   ALT 38  --   AST 40  --   GLUCOSE 107* 77   Lab Results  Component Value Date   CKTOTAL 140 12/11/2020   TROPONINI 0.03 (HH) 12/29/2016   No results found for: CHOL No results found for: HDL No results found for: LDLCALC No results found for: TRIG No results found for: CHOLHDL No results found for: LDLDIRECT    Radiology: CT ABDOMEN PELVIS WO CONTRAST  Result Date: 06/04/2021 CLINICAL DATA:  Abdominal distention for 3-4 days. EXAM: CT ABDOMEN AND PELVIS WITHOUT CONTRAST  TECHNIQUE: Multidetector CT imaging of the abdomen and pelvis was performed following the standard protocol without IV contrast. COMPARISON:  12/11/2020 FINDINGS: Lower chest: Moderate right and small left pleural effusions. Hepatobiliary: No focal abnormality in the liver on this study without intravenous contrast. 12 mm calcified gallstone again noted. No intrahepatic or extrahepatic biliary dilation. Pancreas: No focal mass lesion. No dilatation of the main duct. No intraparenchymal cyst. Peripancreatic fat in the region of the pancreatic head is not well defined. Spleen: No splenomegaly. No focal mass lesion. Adrenals/Urinary Tract: No adrenal nodule or mass. Kidneys unremarkable. No evidence for hydroureter. The urinary bladder appears normal for the degree of distention. Stomach/Bowel: Stomach is unremarkable. No gastric wall thickening. No evidence of outlet obstruction. Duodenum is normally positioned as is the ligament of Treitz. Probable subtle periduodenal edema. No small bowel wall thickening. No small bowel dilatation. The terminal ileum is normal. The appendix is not well visualized, but there is no edema or inflammation in the region of the cecum. No gross colonic mass. No colonic wall thickening. Vascular/Lymphatic: Abdominal aortic aneurysm measures up to 4.4 cm diameter on today's study. Loss of fat plane between the duodenum in the aorta noted on today's exam. There is no gastrohepatic or hepatoduodenal ligament lymphadenopathy. No retroperitoneal or mesenteric lymphadenopathy. No pelvic sidewall lymphadenopathy. Reproductive: Prostate gland is mildly enlarged. Other: Trace free fluid noted in the abdomen and pelvis. Musculoskeletal: Diffuse body wall edema. No worrisome lytic or sclerotic osseous abnormality. IMPRESSION: 1. Fat planes in the retroperitoneal space of the upper abdomen are not well preserved on today's study. As such, Peri duodenal or peripancreatic edema/inflammation would be a  consideration, for example in the setting of duodenitis or pancreatitis. 2. Infrarenal abdominal aortic aneurysm measuring up to 4.4 cm today, increased from 4.1 cm on the 12/11/2020 exam. Haziness in the retroperitoneal fat extends around the aorta and fat plane between the anterior wall the aorta and the duodenum is not preserved on today's exam. In the setting of GI bleeding, aorto enteric fistula could have this appearance. Recommend follow-up every 12 months and vascular consultation. This recommendation follows ACR consensus guidelines: White Paper of the ACR Incidental Findings Committee II on Vascular Findings. J Am Coll Radiol 2013; 10:789-794. 3. Cholelithiasis. 4. Similar bilateral pleural effusions, right greater than left. 5.  Aortic Atherosclerois (ICD10-170.0) Electronically Signed   By: Misty Stanley M.D.   On: 06/04/2021 07:23   DG Chest 2 View  Result Date: 06/03/2021 CLINICAL DATA:  Shortness of breath EXAM: CHEST - 2 VIEW COMPARISON:  04/09/2021 FINDINGS: Cardiomegaly with mild interstitial edema. Small right pleural effusion. No pneumothorax. Postsurgical changes related to prior CABG. Thoracic aortic atherosclerosis. Median sternotomy. IMPRESSION: Cardiomegaly  with mild interstitial edema and small right pleural effusion. Electronically Signed   By: Julian Hy M.D.   On: 06/03/2021 23:32   DG Shoulder Right  Result Date: 05/16/2021 CLINICAL DATA:  87 year old male status post fall yesterday with pain and limited range of motion. EXAM: RIGHT SHOULDER - 2+ VIEW COMPARISON:  Chest radiograph 03/10/2021. FINDINGS: No glenohumeral joint dislocation. Intact proximal right humerus. Visible right clavicle and scapula appear intact with chronic AC joint and glenoid spurring. Visible right ribs appear grossly intact. IMPRESSION: No acute fracture or dislocation identified about the right shoulder. Electronically Signed   By: Genevie Ann M.D.   On: 05/16/2021 11:50   DG Abdomen 1  View  Result Date: 06/03/2021 CLINICAL DATA:  Abdominal distension EXAM: ABDOMEN - 1 VIEW COMPARISON:  None. FINDINGS: Nonobstructive bowel gas pattern. No evidence of free air under the diaphragm. Normal colonic stool burden. Degenerative changes of the lumbar spine. IMPRESSION: Negative. Electronically Signed   By: Julian Hy M.D.   On: 06/03/2021 23:34   CT HEAD WO CONTRAST (5MM)  Result Date: 05/16/2021 CLINICAL DATA:  Head trauma, minor (Age >= 65y). Neck trauma (Age >= 65y). Additional history provided: Fall, injury to right shoulder. EXAM: CT HEAD WITHOUT CONTRAST CT CERVICAL SPINE WITHOUT CONTRAST TECHNIQUE: Multidetector CT imaging of the head and cervical spine was performed following the standard protocol without intravenous contrast. Multiplanar CT image reconstructions of the cervical spine were also generated. COMPARISON:  Brain MRI 01/18/2008.  Head CT 01/18/2008. FINDINGS: CT HEAD FINDINGS Brain: Mild generalized cerebral and cerebellar atrophy. Small chronic cortical/subcortical infarcts within the bilateral occipital lobes, new as compared to the brain MRI of 01/18/2008. Mild patchy and ill-defined hypoattenuation within the cerebral white matter, nonspecific but compatible with chronic small vessel ischemic disease. Redemonstrated chronic infarcts within the bilateral cerebellar hemispheres (the infarct within the right cerebellum was acute at time of the prior MRI). There is no acute intracranial hemorrhage. No acute demarcated cortical infarct. No extra-axial fluid collection. No evidence of an intracranial mass. No midline shift. Vascular: No hyperdense vessel.  Atherosclerotic calcifications. Skull: No acute calvarial fracture or focal suspicious osseous lesion. Sinuses/Orbits: Visualized orbits show no acute finding. Small volume frothy secretions within the right frontal sinus. Trace bilateral ethmoid sinus mucosal thickening. Trace mucosal thickening within the right sphenoid  sinus. Trace mucosal thickening within the bilateral maxillary sinuses at the imaged levels. CT CERVICAL SPINE FINDINGS Alignment: Trace C4-C5 and C5-C6 grade 1 anterolisthesis. Skull base and vertebrae: The basion-dental and atlanto-dental intervals are maintained.No evidence of acute fracture to the cervical spine. Soft tissues and spinal canal: No prevertebral fluid or swelling. No visible canal hematoma. Disc levels: Cervical spondylosis with multilevel disc space narrowing, disc bulges/central disc protrusions, uncovertebral hypertrophy and facet arthrosis. No appreciable high-grade spinal canal stenosis. Multilevel bony neural foraminal narrowing. Ventral osteophytes at the C5-C6, C6-C7, C7-T1 and T2-T3 levels. Upper chest: Partially imaged right pleural effusion. No visible pneumothorax. IMPRESSION: CT head: 1. No evidence of acute intracranial abnormality. 2. Small chronic cortical/subcortical infarcts within the bilateral occipital lobes (PCA vascular territories), new as compared to the brain MRI of 01/18/2008. 3. Redemonstrated chronic infarcts within the bilateral cerebellar hemispheres. 4. Mild chronic small vessel ischemic changes within the cerebral white matter. 5. Mild generalized parenchymal atrophy. 6. Paranasal sinus disease at the imaged levels, as described. CT cervical spine: 1. No evidence of acute fracture to the cervical spine. 2. Trace C4-C5 and C5-C6 grade 1 anterolisthesis. 3. Cervical spondylosis, as described.  4. Partially imaged right pleural effusion. Electronically Signed   By: Kellie Simmering DO   On: 05/16/2021 12:20   CT Cervical Spine Wo Contrast  Result Date: 05/16/2021 CLINICAL DATA:  Head trauma, minor (Age >= 65y). Neck trauma (Age >= 65y). Additional history provided: Fall, injury to right shoulder. EXAM: CT HEAD WITHOUT CONTRAST CT CERVICAL SPINE WITHOUT CONTRAST TECHNIQUE: Multidetector CT imaging of the head and cervical spine was performed following the standard  protocol without intravenous contrast. Multiplanar CT image reconstructions of the cervical spine were also generated. COMPARISON:  Brain MRI 01/18/2008.  Head CT 01/18/2008. FINDINGS: CT HEAD FINDINGS Brain: Mild generalized cerebral and cerebellar atrophy. Small chronic cortical/subcortical infarcts within the bilateral occipital lobes, new as compared to the brain MRI of 01/18/2008. Mild patchy and ill-defined hypoattenuation within the cerebral white matter, nonspecific but compatible with chronic small vessel ischemic disease. Redemonstrated chronic infarcts within the bilateral cerebellar hemispheres (the infarct within the right cerebellum was acute at time of the prior MRI). There is no acute intracranial hemorrhage. No acute demarcated cortical infarct. No extra-axial fluid collection. No evidence of an intracranial mass. No midline shift. Vascular: No hyperdense vessel.  Atherosclerotic calcifications. Skull: No acute calvarial fracture or focal suspicious osseous lesion. Sinuses/Orbits: Visualized orbits show no acute finding. Small volume frothy secretions within the right frontal sinus. Trace bilateral ethmoid sinus mucosal thickening. Trace mucosal thickening within the right sphenoid sinus. Trace mucosal thickening within the bilateral maxillary sinuses at the imaged levels. CT CERVICAL SPINE FINDINGS Alignment: Trace C4-C5 and C5-C6 grade 1 anterolisthesis. Skull base and vertebrae: The basion-dental and atlanto-dental intervals are maintained.No evidence of acute fracture to the cervical spine. Soft tissues and spinal canal: No prevertebral fluid or swelling. No visible canal hematoma. Disc levels: Cervical spondylosis with multilevel disc space narrowing, disc bulges/central disc protrusions, uncovertebral hypertrophy and facet arthrosis. No appreciable high-grade spinal canal stenosis. Multilevel bony neural foraminal narrowing. Ventral osteophytes at the C5-C6, C6-C7, C7-T1 and T2-T3 levels.  Upper chest: Partially imaged right pleural effusion. No visible pneumothorax. IMPRESSION: CT head: 1. No evidence of acute intracranial abnormality. 2. Small chronic cortical/subcortical infarcts within the bilateral occipital lobes (PCA vascular territories), new as compared to the brain MRI of 01/18/2008. 3. Redemonstrated chronic infarcts within the bilateral cerebellar hemispheres. 4. Mild chronic small vessel ischemic changes within the cerebral white matter. 5. Mild generalized parenchymal atrophy. 6. Paranasal sinus disease at the imaged levels, as described. CT cervical spine: 1. No evidence of acute fracture to the cervical spine. 2. Trace C4-C5 and C5-C6 grade 1 anterolisthesis. 3. Cervical spondylosis, as described. 4. Partially imaged right pleural effusion. Electronically Signed   By: Kellie Simmering DO   On: 05/16/2021 12:20   DG Hip Unilat W or Wo Pelvis 2-3 Views Left  Result Date: 06/04/2021 CLINICAL DATA:  Left hip pain for 3-4 days. EXAM: DG HIP (WITH OR WITHOUT PELVIS) 2-3V LEFT COMPARISON:  06/04/2021 abdominal CT FINDINGS: There is no evidence of hip fracture or dislocation. There is no evidence of arthropathy or other focal bone abnormality. Osteopenia and atherosclerosis. Postoperative left groin. IMPRESSION: No acute finding. Electronically Signed   By: Monte Fantasia M.D.   On: 06/04/2021 07:43    EKG: Sinus bradycardia with lateral T wave inversion  ASSESSMENT AND PLAN:   1.  Acute on chronic systolic congestive heart failure, BNP 3266, chest x-ray revealing mild interstitial edema, recent 2D echocardiogram 04/10/2021 revealed LVEF 25 to 30% 2.  Sinus bradycardia, hemodynamically stable, on metoprolol  succinate 50 mg daily 3.  Coronary artery disease, status post CABG x3, no chest pain, trivial elevation high-sensitivity troponin without delta (22, 22, 20) 4.  Infrarenal abdominal aortic aneurysm, 4.4 cm  Recommendations  1.  Agree with overall current therapy 2.  Continue  diuresis 3.  Carefully monitor renal status 4.  Hold metoprolol succinate for now 5.  Defer full dose anticoagulation in the absence of chest pain 6.  Await vascular consult, monitor for now  Signed: Isaias Cowman MD,PhD, [facility] 06/04/2021, 9:57 AM

## 2021-06-04 NOTE — ED Notes (Signed)
Pt noted to be more bradycardic than when pt initially presented to ED. EKG performed and shown to admitting MD

## 2021-06-04 NOTE — Consult Note (Signed)
$'@LOGO'D$ @   MRN : [id number]  Daniel Bolton is a 87 y.o. (25-Jul-1934) male who presents with chief complaint of abdominal pain.  History of Present Illness: The patient presents to [facility] emergency room for evaluation of abdominal pain.  The history is obtained through an interpreter.  The patient states yesterday he was feeling abdominal pain pointing to the epigastric region noting that it felt like he was being stretched.  CT scan was ordered for to assess his abdominal complaints and the aneurysm was found incidentally.  At the time that I spoke with the patient he denies abdominal pain or unusual back pain, no other abdominal complaints.  In fact he is eating quite heartily.  No history of an acute onset of painful blue discoloration of the toes.     No family history of AAA.   Patient denies amaurosis fugax or TIA symptoms. There is no history of claudication or rest pain symptoms of the lower extremities.  The patient denies angina or shortness of breath.  CT scan is reviewed by me shows an AAA that measures 4.4 cm  Incidentally although the patient denied knowledge of an abdominal aortic aneurysm there was a CT abdomen pelvis without contrast from December 11, 2020 which noted the abdominal aortic aneurysm and measured at 4.1 cm.  Current Meds  Medication Sig   aspirin EC 81 MG EC tablet Take 1 tablet (81 mg total) by mouth daily. Swallow whole.   clopidogrel (PLAVIX) 75 MG tablet Take 75 mg by mouth daily.   furosemide (LASIX) 40 MG tablet Take 1 tablet (40 mg total) by mouth daily.   metoprolol succinate (TOPROL-XL) 50 MG 24 hr tablet Take 50 mg by mouth daily. Take with or immediately following a meal.   olmesartan (BENICAR) 20 MG tablet Take 20 mg by mouth daily.   simvastatin (ZOCOR) 80 MG tablet Take 80 mg by mouth daily.    Past Medical History:  Diagnosis Date   CHF (congestive heart failure) ([facility])    Chronic kidney disease    Hypertension    Hyponatremia     Stroke [facility])     Past Surgical History:  Procedure Laterality Date   BRAIN SURGERY     CARDIAC SURGERY      Social History Social History   Tobacco Use   Smoking status: Never   Smokeless tobacco: Never  Substance Use Topics   Alcohol use: No   Drug use: No    Family History Family History  Problem Relation Age of Onset   Cancer Sister    Heart disease Sister     Allergies  Allergen Reactions   Hydralazine     Pt has severe dizziness with it, and does not want to take it.     REVIEW OF SYSTEMS (Negative unless checked)  Constitutional: '[]'$ Weight loss  '[]'$ Fever  '[]'$ Chills Cardiac: '[]'$ Chest pain   '[]'$ Chest pressure   '[]'$ Palpitations   '[]'$ Shortness of breath when laying flat   '[x]'$ Shortness of breath with exertion. Vascular:  '[]'$ Pain in legs with walking   '[]'$ Pain in legs at rest  '[]'$ History of DVT   '[]'$ Phlebitis   '[]'$ Swelling in legs   '[]'$ Varicose veins   '[]'$ Non-healing ulcers Pulmonary:   '[]'$ Uses home oxygen   '[]'$ Productive cough   '[]'$ Hemoptysis   '[]'$ Wheeze  '[]'$ COPD   '[]'$ Asthma Neurologic:  '[]'$ Dizziness   '[]'$ Seizures   '[]'$ History of stroke   '[]'$ History of TIA  '[]'$ Aphasia   '[]'$ Vissual changes   '[]'$ Weakness or numbness in arm   '[]'$   Weakness or numbness in leg Musculoskeletal:   '[]'$ Joint swelling   '[]'$ Joint pain   '[]'$ Low back pain Hematologic:  '[]'$ Easy bruising  '[]'$ Easy bleeding   '[]'$ Hypercoagulable state   '[]'$ Anemic Gastrointestinal:  '[]'$ Diarrhea   '[]'$ Vomiting  '[]'$ Gastroesophageal reflux/heartburn   '[]'$ Difficulty swallowing. Genitourinary:  '[x]'$ Chronic kidney disease   '[]'$ Difficult urination  '[]'$ Frequent urination   '[]'$ Blood in urine Skin:  '[]'$ Rashes   '[]'$ Ulcers  Psychological:  '[]'$ History of anxiety   '[]'$  History of major depression.  Physical Examination  Vitals:   06/04/21 1600 06/04/21 1700 06/04/21 1721 06/04/21 1855  BP:   (!) 141/59 122/63  Pulse:   (!) 45 (!) 45  Resp: 14 (!) '22 18 18  '$ Temp:   98 F (36.7 C) 97.8 F (36.6 C)  TempSrc:   Oral Oral  SpO2:   94% 100%  Weight:       Body mass index is  25.41 kg/m. Gen: WD/WN, NAD Head: [place]/AT, No temporalis wasting.  Ear/Nose/Throat: Hearing grossly intact, nares w/o erythema or drainage Eyes: PER, EOMI, sclera nonicteric.  Neck: Supple, no masses.  No bruit or JVD.  Pulmonary:  Good air movement, no audible wheezing, no use of accessory muscles.  Cardiac: RRR, the precordium is not hyperdynamic. Vascular:   Vessel Right Left  Radial Palpable Palpable  Gastrointestinal: soft, non-distended. No guarding/no peritoneal signs.  Musculoskeletal: M/S 5/5 throughout.  No visible deformity.  Neurologic: CN 2-12 intact. Pain and light touch intact in extremities.  Symmetrical.  Speech is fluent. Motor exam as listed above. Psychiatric: Judgment intact, Mood & affect appropriate for pt's clinical situation. Dermatologic: No rashes or ulcers noted.  No changes consistent with cellulitis.   CBC Lab Results  Component Value Date   WBC 7.7 06/03/2021   HGB 10.8 (L) 06/03/2021   HCT 31.9 (L) 06/03/2021   MCV 86.4 06/03/2021   PLT 148 (L) 06/03/2021    BMET    Component Value Date/Time   NA 127 (L) 06/04/2021 1659   K 4.3 06/04/2021 1659   CL 94 (L) 06/04/2021 1659   CO2 26 06/04/2021 1659   GLUCOSE 119 (H) 06/04/2021 1659   BUN 69 (H) 06/04/2021 1659   CREATININE 2.02 (H) 06/04/2021 1659   CALCIUM 8.3 (L) 06/04/2021 1659   GFRNONAA 32 (L) 06/04/2021 1659   GFRAA 59 (L) 12/28/2016 2307   Estimated Creatinine Clearance: 19.8 mL/min (A) (by C-G formula based on SCr of 2.02 mg/dL (H)).  COAG Lab Results  Component Value Date   INR 1.1 12/11/2020    Radiology CT ABDOMEN PELVIS WO CONTRAST  Result Date: 06/04/2021 CLINICAL DATA:  Abdominal distention for 3-4 days. EXAM: CT ABDOMEN AND PELVIS WITHOUT CONTRAST TECHNIQUE: Multidetector CT imaging of the abdomen and pelvis was performed following the standard protocol without IV contrast. COMPARISON:  12/11/2020 FINDINGS: Lower chest: Moderate right and small left pleural effusions.  Hepatobiliary: No focal abnormality in the liver on this study without intravenous contrast. 12 mm calcified gallstone again noted. No intrahepatic or extrahepatic biliary dilation. Pancreas: No focal mass lesion. No dilatation of the main duct. No intraparenchymal cyst. Peripancreatic fat in the region of the pancreatic head is not well defined. Spleen: No splenomegaly. No focal mass lesion. Adrenals/Urinary Tract: No adrenal nodule or mass. Kidneys unremarkable. No evidence for hydroureter. The urinary bladder appears normal for the degree of distention. Stomach/Bowel: Stomach is unremarkable. No gastric wall thickening. No evidence of outlet obstruction. Duodenum is normally positioned as is the ligament of Treitz. Probable subtle periduodenal  edema. No small bowel wall thickening. No small bowel dilatation. The terminal ileum is normal. The appendix is not well visualized, but there is no edema or inflammation in the region of the cecum. No gross colonic mass. No colonic wall thickening. Vascular/Lymphatic: Abdominal aortic aneurysm measures up to 4.4 cm diameter on today's study. Loss of fat plane between the duodenum in the aorta noted on today's exam. There is no gastrohepatic or hepatoduodenal ligament lymphadenopathy. No retroperitoneal or mesenteric lymphadenopathy. No pelvic sidewall lymphadenopathy. Reproductive: Prostate gland is mildly enlarged. Other: Trace free fluid noted in the abdomen and pelvis. Musculoskeletal: Diffuse body wall edema. No worrisome lytic or sclerotic osseous abnormality. IMPRESSION: 1. Fat planes in the retroperitoneal space of the upper abdomen are not well preserved on today's study. As such, Peri duodenal or peripancreatic edema/inflammation would be a consideration, for example in the setting of duodenitis or pancreatitis. 2. Infrarenal abdominal aortic aneurysm measuring up to 4.4 cm today, increased from 4.1 cm on the 12/11/2020 exam. Haziness in the retroperitoneal fat  extends around the aorta and fat plane between the anterior wall the aorta and the duodenum is not preserved on today's exam. In the setting of GI bleeding, aorto enteric fistula could have this appearance. Recommend follow-up every 12 months and vascular consultation. This recommendation follows ACR consensus guidelines: White Paper of the ACR Incidental Findings Committee II on Vascular Findings. J Am Coll Radiol 2013; 10:789-794. 3. Cholelithiasis. 4. Similar bilateral pleural effusions, right greater than left. 5.  Aortic Atherosclerois (ICD10-170.0) Electronically Signed   By: Misty Stanley M.D.   On: 06/04/2021 07:23   DG Chest 2 View  Result Date: 06/03/2021 CLINICAL DATA:  Shortness of breath EXAM: CHEST - 2 VIEW COMPARISON:  04/09/2021 FINDINGS: Cardiomegaly with mild interstitial edema. Small right pleural effusion. No pneumothorax. Postsurgical changes related to prior CABG. Thoracic aortic atherosclerosis. Median sternotomy. IMPRESSION: Cardiomegaly with mild interstitial edema and small right pleural effusion. Electronically Signed   By: Julian Hy M.D.   On: 06/03/2021 23:32   DG Shoulder Right  Result Date: 05/16/2021 CLINICAL DATA:  87 year old male status post fall yesterday with pain and limited range of motion. EXAM: RIGHT SHOULDER - 2+ VIEW COMPARISON:  Chest radiograph 03/10/2021. FINDINGS: No glenohumeral joint dislocation. Intact proximal right humerus. Visible right clavicle and scapula appear intact with chronic AC joint and glenoid spurring. Visible right ribs appear grossly intact. IMPRESSION: No acute fracture or dislocation identified about the right shoulder. Electronically Signed   By: Genevie Ann M.D.   On: 05/16/2021 11:50   DG Abdomen 1 View  Result Date: 06/03/2021 CLINICAL DATA:  Abdominal distension EXAM: ABDOMEN - 1 VIEW COMPARISON:  None. FINDINGS: Nonobstructive bowel gas pattern. No evidence of free air under the diaphragm. Normal colonic stool burden.  Degenerative changes of the lumbar spine. IMPRESSION: Negative. Electronically Signed   By: Julian Hy M.D.   On: 06/03/2021 23:34   CT HEAD WO CONTRAST (5MM)  Result Date: 05/16/2021 CLINICAL DATA:  Head trauma, minor (Age >= 65y). Neck trauma (Age >= 65y). Additional history provided: Fall, injury to right shoulder. EXAM: CT HEAD WITHOUT CONTRAST CT CERVICAL SPINE WITHOUT CONTRAST TECHNIQUE: Multidetector CT imaging of the head and cervical spine was performed following the standard protocol without intravenous contrast. Multiplanar CT image reconstructions of the cervical spine were also generated. COMPARISON:  Brain MRI 01/18/2008.  Head CT 01/18/2008. FINDINGS: CT HEAD FINDINGS Brain: Mild generalized cerebral and cerebellar atrophy. Small chronic cortical/subcortical infarcts within the bilateral  occipital lobes, new as compared to the brain MRI of 01/18/2008. Mild patchy and ill-defined hypoattenuation within the cerebral white matter, nonspecific but compatible with chronic small vessel ischemic disease. Redemonstrated chronic infarcts within the bilateral cerebellar hemispheres (the infarct within the right cerebellum was acute at time of the prior MRI). There is no acute intracranial hemorrhage. No acute demarcated cortical infarct. No extra-axial fluid collection. No evidence of an intracranial mass. No midline shift. Vascular: No hyperdense vessel.  Atherosclerotic calcifications. Skull: No acute calvarial fracture or focal suspicious osseous lesion. Sinuses/Orbits: Visualized orbits show no acute finding. Small volume frothy secretions within the right frontal sinus. Trace bilateral ethmoid sinus mucosal thickening. Trace mucosal thickening within the right sphenoid sinus. Trace mucosal thickening within the bilateral maxillary sinuses at the imaged levels. CT CERVICAL SPINE FINDINGS Alignment: Trace C4-C5 and C5-C6 grade 1 anterolisthesis. Skull base and vertebrae: The basion-dental and  atlanto-dental intervals are maintained.No evidence of acute fracture to the cervical spine. Soft tissues and spinal canal: No prevertebral fluid or swelling. No visible canal hematoma. Disc levels: Cervical spondylosis with multilevel disc space narrowing, disc bulges/central disc protrusions, uncovertebral hypertrophy and facet arthrosis. No appreciable high-grade spinal canal stenosis. Multilevel bony neural foraminal narrowing. Ventral osteophytes at the C5-C6, C6-C7, C7-T1 and T2-T3 levels. Upper chest: Partially imaged right pleural effusion. No visible pneumothorax. IMPRESSION: CT head: 1. No evidence of acute intracranial abnormality. 2. Small chronic cortical/subcortical infarcts within the bilateral occipital lobes (PCA vascular territories), new as compared to the brain MRI of 01/18/2008. 3. Redemonstrated chronic infarcts within the bilateral cerebellar hemispheres. 4. Mild chronic small vessel ischemic changes within the cerebral white matter. 5. Mild generalized parenchymal atrophy. 6. Paranasal sinus disease at the imaged levels, as described. CT cervical spine: 1. No evidence of acute fracture to the cervical spine. 2. Trace C4-C5 and C5-C6 grade 1 anterolisthesis. 3. Cervical spondylosis, as described. 4. Partially imaged right pleural effusion. Electronically Signed   By: Kellie Simmering DO   On: 05/16/2021 12:20   CT Cervical Spine Wo Contrast  Result Date: 05/16/2021 CLINICAL DATA:  Head trauma, minor (Age >= 65y). Neck trauma (Age >= 65y). Additional history provided: Fall, injury to right shoulder. EXAM: CT HEAD WITHOUT CONTRAST CT CERVICAL SPINE WITHOUT CONTRAST TECHNIQUE: Multidetector CT imaging of the head and cervical spine was performed following the standard protocol without intravenous contrast. Multiplanar CT image reconstructions of the cervical spine were also generated. COMPARISON:  Brain MRI 01/18/2008.  Head CT 01/18/2008. FINDINGS: CT HEAD FINDINGS Brain: Mild generalized  cerebral and cerebellar atrophy. Small chronic cortical/subcortical infarcts within the bilateral occipital lobes, new as compared to the brain MRI of 01/18/2008. Mild patchy and ill-defined hypoattenuation within the cerebral white matter, nonspecific but compatible with chronic small vessel ischemic disease. Redemonstrated chronic infarcts within the bilateral cerebellar hemispheres (the infarct within the right cerebellum was acute at time of the prior MRI). There is no acute intracranial hemorrhage. No acute demarcated cortical infarct. No extra-axial fluid collection. No evidence of an intracranial mass. No midline shift. Vascular: No hyperdense vessel.  Atherosclerotic calcifications. Skull: No acute calvarial fracture or focal suspicious osseous lesion. Sinuses/Orbits: Visualized orbits show no acute finding. Small volume frothy secretions within the right frontal sinus. Trace bilateral ethmoid sinus mucosal thickening. Trace mucosal thickening within the right sphenoid sinus. Trace mucosal thickening within the bilateral maxillary sinuses at the imaged levels. CT CERVICAL SPINE FINDINGS Alignment: Trace C4-C5 and C5-C6 grade 1 anterolisthesis. Skull base and vertebrae: The basion-dental and atlanto-dental  intervals are maintained.No evidence of acute fracture to the cervical spine. Soft tissues and spinal canal: No prevertebral fluid or swelling. No visible canal hematoma. Disc levels: Cervical spondylosis with multilevel disc space narrowing, disc bulges/central disc protrusions, uncovertebral hypertrophy and facet arthrosis. No appreciable high-grade spinal canal stenosis. Multilevel bony neural foraminal narrowing. Ventral osteophytes at the C5-C6, C6-C7, C7-T1 and T2-T3 levels. Upper chest: Partially imaged right pleural effusion. No visible pneumothorax. IMPRESSION: CT head: 1. No evidence of acute intracranial abnormality. 2. Small chronic cortical/subcortical infarcts within the bilateral occipital  lobes (PCA vascular territories), new as compared to the brain MRI of 01/18/2008. 3. Redemonstrated chronic infarcts within the bilateral cerebellar hemispheres. 4. Mild chronic small vessel ischemic changes within the cerebral white matter. 5. Mild generalized parenchymal atrophy. 6. Paranasal sinus disease at the imaged levels, as described. CT cervical spine: 1. No evidence of acute fracture to the cervical spine. 2. Trace C4-C5 and C5-C6 grade 1 anterolisthesis. 3. Cervical spondylosis, as described. 4. Partially imaged right pleural effusion. Electronically Signed   By: Kellie Simmering DO   On: 05/16/2021 12:20   DG Hip Unilat W or Wo Pelvis 2-3 Views Left  Result Date: 06/04/2021 CLINICAL DATA:  Left hip pain for 3-4 days. EXAM: DG HIP (WITH OR WITHOUT PELVIS) 2-3V LEFT COMPARISON:  06/04/2021 abdominal CT FINDINGS: There is no evidence of hip fracture or dislocation. There is no evidence of arthropathy or other focal bone abnormality. Osteopenia and atherosclerosis. Postoperative left groin. IMPRESSION: No acute finding. Electronically Signed   By: Monte Fantasia M.D.   On: 06/04/2021 07:43     Assessment/Plan 1.  Abdominal aortic aneurysm without rupture:   No surgery or intervention at this time. The patient has an asymptomatic abdominal aortic aneurysm that is greater than 4 cm but less than 5 cm in maximal diameter.  I have discussed the natural history of abdominal aortic aneurysm and the small risk of rupture for aneurysm less than 5 cm in size.  However, as these small aneurysms tend to enlarge over time, continued surveillance with ultrasound or CT scan is mandatory.   I have also discussed optimizing medical management with hypertension and lipid control.    I will see the patient back in the office 2 to 3 weeks after discharge.  I will plan for the follow-up scan in 4 months rather than 6 months given his 3 mm of growth and just 5 months which is a little bit faster than average.  2.   Chronic renal insufficiency stage III:   At the present time the patient does not need dialysis access.  Avoid nephrotoxic medications and dehydration.  Further plans per nephrology   3.  Hypertension:  Patient will continue medical management; nephrology is following no changes in oral medications.  4.   Coronary artery disease:  EKG will be monitored. Nitrates will be used if needed. The patient's oral cardiac medications will be continued.     Hortencia Pilar, MD  06/04/2021 7:14 PM

## 2021-06-04 NOTE — H&P (Addendum)
History and Physical    Daniel Bolton [id number] DOB: May 23, 1934 DOA: 06/04/2021  Referring MD/NP/PA:   PCP: [name], MD   Patient coming from:  The patient is coming from home.    Chief Complaint: SOB, constipation  HPI: Daniel Bolton is a 87 y.o. male with medical history significant of CHF with EF 25-30%, hypertension, hyperlipidemia, stroke, GERD, CKD-3B, CAD, CABG, anemia, hyponatremia, AAA, who presents with shortness of breath and constipation.  Patient speaks Spanish, history is obtained with iPad interpreters' help  Patient states he has shortness of breath in the past several days, which has been progressively worsening.  He has orthopnea and leg edema.  No chest pain, cough, fever or chills.  He states that he is constipated in the past several days. He reported to ED physician that he had some abdominal pain, which has resolved.  He denies abdominal pain to me currently.  He has nausea, no vomiting or diarrhea.  He states that he had a bowel movement yesterday with very little stool coming out.  He complains of left hip pain, which is constant, mild to moderate, radiating to the legs.  Also has some lower back pain.  ED Course: pt was found to have WBC 7.7, troponin level 22, 22, BNP 3266, negative COVID PCR, liver function normal, lipase 39, sodium 123, potassium 5.3, worsening renal function, temperature 97.4, blood pressure 206/90, which improved to 144/55 after giving oral 0.2 mg of clonidine in ED.  Bradycardia with heart rate of349-54, RR 20, oxygen saturation 98% on room air. CXR showed cardiomegaly with mild interstitial edema and small right pleural effusion. X-ray of left hip negative for acute issues.  CT scan of abdomen/pelvis that showed enlargement of AAA from 4.1 --> 4.4 cm. Patient is admitted to progressive bed as inpatient.  CT-abd/pelvis:  1. Fat planes in the retroperitoneal space of the upper abdomen are not well preserved on today's  study. As such, Peri duodenal or peripancreatic edema/inflammation would be a consideration, for example in the setting of duodenitis or pancreatitis.  2. Infrarenal abdominal aortic aneurysm measuring up to 4.4 cm today, increased from 4.1 cm on the 12/11/2020 exam. Haziness in the retroperitoneal fat extends around the aorta and fat plane between the anterior wall the aorta and the duodenum is not preserved on today's exam. In the setting of GI bleeding, aorto enteric fistula could have this appearance. Recommend follow-up every 12 months and vascular consultation. This recommendation follows ACR consensus guidelines: White Paper of the ACR Incidental Findings Committee II on Vascular Findings. J Am Coll Radiol 2013; 10:789-794.  3. Cholelithiasis. 4. Similar bilateral pleural effusions, right greater than left. 5.  Aortic Atherosclerois (ICD10-170.0)   Review of Systems:   General: no fevers, chills, no body weight gain, has fatigue HEENT: no blurry vision, hearing changes or sore throat Respiratory: no dyspnea, coughing, wheezing CV: no chest pain, no palpitations GI: has nausea, constipation, no vomiting, diarrhea, had abdominal pain,  GU: no dysuria, burning on urination, increased urinary frequency, hematuria  Ext: has leg edema Neuro: no unilateral weakness, numbness, or tingling, no vision change or hearing loss Skin: no rash, no skin tear. MSK: No muscle spasm, no deformity, no limitation of range of movement in spin Heme: No easy bruising.  Travel history: No recent long distant travel.  Allergy:  Allergies  Allergen Reactions   Hydralazine     Pt has severe dizziness with it, and does not want to take it.  Past Medical History:  Diagnosis Date   CHF (congestive heart failure) ([place])    Chronic kidney disease    Hypertension    Hyponatremia    Stroke [facility])     Past Surgical History:  Procedure Laterality Date   BRAIN SURGERY     CARDIAC SURGERY       Social History:  reports that he has never smoked. He has never used smokeless tobacco. He reports that he does not drink alcohol and does not use drugs.  Family History:  Family History  Problem Relation Age of Onset   Cancer Sister    Heart disease Sister      Prior to Admission medications   Medication Sig Start Date End Date Taking? Authorizing Provider  albuterol (VENTOLIN HFA) 108 (90 Base) MCG/ACT inhaler Inhale 2 puffs into the lungs every 6 (six) hours as needed for wheezing or shortness of breath.    [provider]  aspirin EC 81 MG EC tablet Take 1 tablet (81 mg total) by mouth daily. Swallow whole. 03/14/21   Danford, Suann Larry, MD  clopidogrel (PLAVIX) 75 MG tablet Take 75 mg by mouth daily.    [provider]  feeding supplement (ENSURE ENLIVE / ENSURE PLUS) LIQD Take 237 mLs by mouth 2 (two) times daily between meals. 04/14/21   Enzo Bi, MD  furosemide (LASIX) 40 MG tablet Take 1 tablet (40 mg total) by mouth daily. 04/14/21 07/13/21  Enzo Bi, MD  metoprolol succinate (TOPROL-XL) 50 MG 24 hr tablet Take 50 mg by mouth daily. Take with or immediately following a meal.    [provider]  olmesartan (BENICAR) 20 MG tablet Take 20 mg by mouth daily.    [provider]  pantoprazole (PROTONIX) 40 MG tablet Take 1 tablet (40 mg total) by mouth daily. Patient not taking: Reported on 05/06/2021 03/26/21   Sharen Hones, MD  simvastatin (ZOCOR) 80 MG tablet Take 80 mg by mouth daily.    [provider]    Physical Exam: Vitals:   06/04/21 0447 06/04/21 0630 06/04/21 0730 06/04/21 0850  BP: (!) 206/90 (!) 205/99 (!) 144/55 (!) 147/58  Pulse: (!) 51 (!) 46 (!) 54 (!) 38  Resp: '20 20 18 19  '$ Temp: (!) 97.4 F (36.3 C)   97.9 F (36.6 C)  TempSrc: Oral     SpO2: 99% 98% 98% 99%  Weight:       General: Not in acute distress HEENT:       Eyes: PERRL, EOMI, no scleral icterus.       ENT: No discharge from the ears and nose, no  pharynx injection, no tonsillar enlargement.        Neck: positive JVD, no bruit, no mass felt. Heme: No neck lymph node enlargement. Cardiac: S1/S2, RRR, No murmurs, No gallops or rubs. Respiratory: No rales, wheezing, rhonchi or rubs. GI: Soft, nondistended, nontender, no rebound pain, no organomegaly, BS present. GU: No hematuria Ext: 1+ pitting leg edema bilaterally. 1+DP/PT pulse bilaterally. Musculoskeletal: No joint deformities, No joint redness or warmth, no limitation of ROM in spin. Skin: No rashes.  Neuro: Alert, oriented X3, cranial nerves II-XII grossly intact, moves all extremities normally. Psych: Patient is not psychotic, no suicidal or hemocidal ideation.  Labs on Admission: I have personally reviewed following labs and imaging studies  CBC: Recent Labs  Lab 06/03/21 2311  WBC 7.7  HGB 10.8*  HCT 31.9*  MCV 86.4  PLT 123456*   Basic Metabolic Panel:  Recent Labs  Lab 06/03/21 2311  NA 123*  K 5.3*  CL 92*  CO2 23  GLUCOSE 107*  BUN 69*  CREATININE 2.08*  CALCIUM 8.4*   GFR: Estimated Creatinine Clearance: 19.2 mL/min (A) (by C-G formula based on SCr of 2.08 mg/dL (H)). Liver Function Tests: Recent Labs  Lab 06/03/21 2311  AST 40  ALT 38  ALKPHOS 134*  BILITOT 1.0  PROT 6.8  ALBUMIN 3.3*   Recent Labs  Lab 06/03/21 2311  LIPASE 39   No results for input(s): AMMONIA in the last 168 hours. Coagulation Profile: No results for input(s): INR, PROTIME in the last 168 hours. Cardiac Enzymes: No results for input(s): CKTOTAL, CKMB, CKMBINDEX, TROPONINI in the last 168 hours. BNP (last 3 results) No results for input(s): PROBNP in the last 8760 hours. HbA1C: No results for input(s): HGBA1C in the last 72 hours. CBG: No results for input(s): GLUCAP in the last 168 hours. Lipid Profile: No results for input(s): CHOL, HDL, LDLCALC, TRIG, CHOLHDL, LDLDIRECT in the last 72 hours. Thyroid Function Tests: No results for input(s): TSH, T4TOTAL, FREET4,  T3FREE, THYROIDAB in the last 72 hours. Anemia Panel: No results for input(s): VITAMINB12, FOLATE, FERRITIN, TIBC, IRON, RETICCTPCT in the last 72 hours. Urine analysis:    Component Value Date/Time   COLORURINE YELLOW (A) 06/04/2021 0237   APPEARANCEUR CLEAR (A) 06/04/2021 0237   LABSPEC 1.008 06/04/2021 0237   PHURINE 6.0 06/04/2021 0237   GLUCOSEU NEGATIVE 06/04/2021 0237   HGBUR NEGATIVE 06/04/2021 0237   BILIRUBINUR NEGATIVE 06/04/2021 0237   KETONESUR NEGATIVE 06/04/2021 0237   PROTEINUR 30 (A) 06/04/2021 0237   NITRITE NEGATIVE 06/04/2021 0237   LEUKOCYTESUR NEGATIVE 06/04/2021 0237   Sepsis Labs: '@LABRCNTIP'$ (procalcitonin:4,lacticidven:4) ) Recent Results (from the past 240 hour(s))  Resp Panel by RT-PCR (Flu A&B, Covid) Nasopharyngeal Swab     Status: None   Collection Time: 06/04/21  6:30 AM   Specimen: Nasopharyngeal Swab; Nasopharyngeal(NP) swabs in vial transport medium  Result Value Ref Range Status   SARS Coronavirus 2 by RT PCR NEGATIVE NEGATIVE Final    Comment: (NOTE) SARS-CoV-2 target nucleic acids are NOT DETECTED.  The SARS-CoV-2 RNA is generally detectable in upper respiratory specimens during the acute phase of infection. The lowest concentration of SARS-CoV-2 viral copies this assay can detect is 138 copies/mL. A negative result does not preclude SARS-Cov-2 infection and should not be used as the sole basis for treatment or other patient management decisions. A negative result may occur with  improper specimen collection/handling, submission of specimen other than nasopharyngeal swab, presence of viral mutation(s) within the areas targeted by this assay, and inadequate number of viral copies(<138 copies/mL). A negative result must be combined with clinical observations, patient history, and epidemiological information. The expected result is Negative.  Fact Sheet for Patients:  EntrepreneurPulse.com.au  Fact Sheet for Healthcare  Providers:  IncredibleEmployment.be  This test is no t yet approved or cleared by the [place] FDA and  has been authorized for detection and/or diagnosis of SARS-CoV-2 by FDA under an Emergency Use Authorization (EUA). This EUA will remain  in effect (meaning this test can be used) for the duration of the COVID-19 declaration under Section 564(b)(1) of the Act, 21 U.S.C.section 360bbb-3(b)(1), unless the authorization is terminated  or revoked sooner.       Influenza A by PCR NEGATIVE NEGATIVE Final   Influenza B by PCR NEGATIVE NEGATIVE Final    Comment: (NOTE) The Xpert Xpress SARS-CoV-2/FLU/RSV plus assay is  intended as an aid in the diagnosis of influenza from Nasopharyngeal swab specimens and should not be used as a sole basis for treatment. Nasal washings and aspirates are unacceptable for Xpert Xpress SARS-CoV-2/FLU/RSV testing.  Fact Sheet for Patients: EntrepreneurPulse.com.au  Fact Sheet for Healthcare Providers: IncredibleEmployment.be  This test is not yet approved or cleared by the [place] FDA and has been authorized for detection and/or diagnosis of SARS-CoV-2 by FDA under an Emergency Use Authorization (EUA). This EUA will remain in effect (meaning this test can be used) for the duration of the COVID-19 declaration under Section 564(b)(1) of the Act, 21 U.S.C. section 360bbb-3(b)(1), unless the authorization is terminated or revoked.  Performed at [facility] [place], [address] [place], [place] [address]      Radiological Exams on Admission: CT ABDOMEN PELVIS WO CONTRAST  Result Date: 06/04/2021 CLINICAL DATA:  Abdominal distention for 3-4 days. EXAM: CT ABDOMEN AND PELVIS WITHOUT CONTRAST TECHNIQUE: Multidetector CT imaging of the abdomen and pelvis was performed following the standard protocol without IV contrast. COMPARISON:  12/11/2020 FINDINGS: Lower chest: Moderate right and  small left pleural effusions. Hepatobiliary: No focal abnormality in the liver on this study without intravenous contrast. 12 mm calcified gallstone again noted. No intrahepatic or extrahepatic biliary dilation. Pancreas: No focal mass lesion. No dilatation of the main duct. No intraparenchymal cyst. Peripancreatic fat in the region of the pancreatic head is not well defined. Spleen: No splenomegaly. No focal mass lesion. Adrenals/Urinary Tract: No adrenal nodule or mass. Kidneys unremarkable. No evidence for hydroureter. The urinary bladder appears normal for the degree of distention. Stomach/Bowel: Stomach is unremarkable. No gastric wall thickening. No evidence of outlet obstruction. Duodenum is normally positioned as is the ligament of Treitz. Probable subtle periduodenal edema. No small bowel wall thickening. No small bowel dilatation. The terminal ileum is normal. The appendix is not well visualized, but there is no edema or inflammation in the region of the cecum. No gross colonic mass. No colonic wall thickening. Vascular/Lymphatic: Abdominal aortic aneurysm measures up to 4.4 cm diameter on today's study. Loss of fat plane between the duodenum in the aorta noted on today's exam. There is no gastrohepatic or hepatoduodenal ligament lymphadenopathy. No retroperitoneal or mesenteric lymphadenopathy. No pelvic sidewall lymphadenopathy. Reproductive: Prostate gland is mildly enlarged. Other: Trace free fluid noted in the abdomen and pelvis. Musculoskeletal: Diffuse body wall edema. No worrisome lytic or sclerotic osseous abnormality. IMPRESSION: 1. Fat planes in the retroperitoneal space of the upper abdomen are not well preserved on today's study. As such, Peri duodenal or peripancreatic edema/inflammation would be a consideration, for example in the setting of duodenitis or pancreatitis. 2. Infrarenal abdominal aortic aneurysm measuring up to 4.4 cm today, increased from 4.1 cm on the 12/11/2020 exam. Haziness  in the retroperitoneal fat extends around the aorta and fat plane between the anterior wall the aorta and the duodenum is not preserved on today's exam. In the setting of GI bleeding, aorto enteric fistula could have this appearance. Recommend follow-up every 12 months and vascular consultation. This recommendation follows ACR consensus guidelines: White Paper of the ACR Incidental Findings Committee II on Vascular Findings. J Am Coll Radiol 2013; 10:789-794. 3. Cholelithiasis. 4. Similar bilateral pleural effusions, right greater than left. 5.  Aortic Atherosclerois (ICD10-170.0) Electronically Signed   By: Misty Stanley M.D.   On: 06/04/2021 07:23   DG Chest 2 View  Result Date: 06/03/2021 CLINICAL DATA:  Shortness of breath EXAM: CHEST - 2 VIEW COMPARISON:  04/09/2021 FINDINGS: Cardiomegaly with mild interstitial edema. Small right pleural effusion. No pneumothorax. Postsurgical changes related to prior CABG. Thoracic aortic atherosclerosis. Median sternotomy. IMPRESSION: Cardiomegaly with mild interstitial edema and small right pleural effusion. Electronically Signed   By: Julian Hy M.D.   On: 06/03/2021 23:32   DG Abdomen 1 View  Result Date: 06/03/2021 CLINICAL DATA:  Abdominal distension EXAM: ABDOMEN - 1 VIEW COMPARISON:  None. FINDINGS: Nonobstructive bowel gas pattern. No evidence of free air under the diaphragm. Normal colonic stool burden. Degenerative changes of the lumbar spine. IMPRESSION: Negative. Electronically Signed   By: Julian Hy M.D.   On: 06/03/2021 23:34   DG Hip Unilat W or Wo Pelvis 2-3 Views Left  Result Date: 06/04/2021 CLINICAL DATA:  Left hip pain for 3-4 days. EXAM: DG HIP (WITH OR WITHOUT PELVIS) 2-3V LEFT COMPARISON:  06/04/2021 abdominal CT FINDINGS: There is no evidence of hip fracture or dislocation. There is no evidence of arthropathy or other focal bone abnormality. Osteopenia and atherosclerosis. Postoperative left groin. IMPRESSION: No acute  finding. Electronically Signed   By: Monte Fantasia M.D.   On: 06/04/2021 07:43     EKG: I have personally reviewed.  Sinus rhythm, QTC 474, occasional PVC, T wave inversion in inferior leads  Assessment/Plan Principal Problem:   Acute on chronic combined systolic and diastolic CHF (congestive heart failure) (HCC) Active Problems:   Coronary artery disease   Hypertensive urgency   Acute renal failure superimposed on stage 3b chronic kidney disease (HCC)   Normochromic normocytic anemia   Essential hypertension   Hyponatremia   Abdominal pain   Constipation   Stroke (HCC)   HLD (hyperlipidemia)   Hyperkalemia   AAA (abdominal aortic aneurysm) (H[facility])   Elevated troponin   Left hip pain   Sinus bradycardia  Acute on chronic combined systolic and diastolic CHF (congestive heart failure) ([place]): 2D echo on 04/20/2021 showed EF 25-30%.  Patient has a positive JVD, leg edema, elevated BNP 3266, clinically consistent with CHF exacerbation.  Patient also has hypertensive emergency. Consulted Card, Dr. Saralyn Pilar  -Will admit to progressive unit as inpatient -Lasix 40 mg bid by IV -will not repeat 2d echo today -Daily weights -strict I/O's -will not start Low salt diet due to hyponatremia -Fluid restriction -Obtain REDs Vest reading  Coronary artery disease and elevated trop: s/p of CABG. Trop  22 --> 22. No CP.  Elevated troponins likely due to demand ischemia secondary to CHF exacerbation and hypertensive urgency. -Continue aspirin, Plavix, Zocor -Get another troponin x1 -As needed nitroglycerin and morphine if he develops chest pain  Hypertensive urgency and HTN: blood pressure 206/90, which improved to 144/55 after giving oral 0.2 mg of clonidine in ED -hold metoprolol and Benicar due to bradycardia and worsening renal function -Start amlodipine 10 mg daily -As needed enalapril 0.625 mg for SBP > 165 -Patient is on IV Lasix  Acute renal failure superimposed on stage 3b chronic  kidney disease ([place]): Baseline creatinine 1.2-1.4 recently.  His creatinine is at 2.08, BUN 69, likely due to cardiorenal syndrome -Hold Benicar -Monitor renal function closely by BMP  Normochromic normocytic anemia: Hemoglobin stable, 10.8 (9.6 on 04/14/2021) -Follow-up with CBC  Hyponatremia: This is chronic issue.  Today sodium 123.  Mental status normal. - Will check urine sodium, urine osmolality, serum osmolality. - Fluid restriction - start sodium chloride tablet 1 g twice daily - f/u by BMP q8h  Abdominal pain and constipation: Patient reports to ED physician that he had  some abdominal pain, which has resolved.  Currently no abdominal pain.  Has nausea but no vomiting.  CT scan of abdomen/pelvis that showed "Fat planes in the retroperitoneal space of the upper abdomen are not well preserved on today's study. As such, Peri duodenal or peripancreatic edema/inflammation would be a consideration, for example in the setting of duodenitis or pancreatitis". Pt has normal lipase and normal liver function.  We will treat constipation. -MiraLAX and senna code  Stroke ([place]) -Aspirin, statin -Hold plavix pending [facility] evaluation of his enlarged AAA  HLD (hyperlipidemia) -Switched home Zocor to Lipitor in hospital  Hyperkalemia: Potassium 5.3. -Expect correction with IV Lasix  AAA (abdominal aortic aneurysm) ([place]): Slightly increased from 4.1-4.4, no abdominal pain currently -Consulted Dr. Delana Meyer of [facility]  Left hip pain: X-ray of left hip negative -As needed Tylenol -PT/OT  Sinus bradycardia: Heart rate 39-50s.  Patient does not have chest pain.  Unclear etiology, may be related to hypertensive urgency and clonidine use -Cardiac monitoring -Hold metoprolol -Avoid nodal blockers        DVT ppx: SQ Lovenox Code Status: Full code per patient and his granddaughter Family Communication: Yes, patient's granddaughter   at bed side Disposition Plan:  Anticipate discharge back to  previous environment Consults called:  dr. Delana Meyer of [facility] and Dr. Saralyn Pilar of cardiology Admission status and Level of care: Progressive Cardiac:    progressive unit   as inpt     Status is: Inpatient  Remains inpatient appropriate because:Inpatient level of care appropriate due to severity of illness  Dispo: The patient is from: Home              Anticipated d/c is to: Home              Patient currently is not medically stable to d/c.   Difficult to place patient No          Date of Service 06/04/2021    [facility]   If 7PM-7AM, please contact night-coverage [email or web address] 06/04/2021, 9:20 AM

## 2021-06-04 NOTE — ED Notes (Signed)
Zach RN aware of assigned bed 

## 2021-06-05 ENCOUNTER — Encounter: Payer: Self-pay | Admitting: Internal Medicine

## 2021-06-05 DIAGNOSIS — I5043 Acute on chronic combined systolic (congestive) and diastolic (congestive) heart failure: Secondary | ICD-10-CM | POA: Diagnosis not present

## 2021-06-05 DIAGNOSIS — I714 Abdominal aortic aneurysm, without rupture: Secondary | ICD-10-CM | POA: Diagnosis not present

## 2021-06-05 DIAGNOSIS — I130 Hypertensive heart and chronic kidney disease with heart failure and stage 1 through stage 4 chronic kidney disease, or unspecified chronic kidney disease: Secondary | ICD-10-CM | POA: Diagnosis not present

## 2021-06-05 DIAGNOSIS — E871 Hypo-osmolality and hyponatremia: Secondary | ICD-10-CM | POA: Diagnosis not present

## 2021-06-05 LAB — BASIC METABOLIC PANEL
Anion gap: 10 (ref 5–15)
Anion gap: 8 (ref 5–15)
BUN: 71 mg/dL — ABNORMAL HIGH (ref 8–23)
BUN: 73 mg/dL — ABNORMAL HIGH (ref 8–23)
CO2: 24 mmol/L (ref 22–32)
CO2: 26 mmol/L (ref 22–32)
Calcium: 8.3 mg/dL — ABNORMAL LOW (ref 8.9–10.3)
Calcium: 8.7 mg/dL — ABNORMAL LOW (ref 8.9–10.3)
Chloride: 95 mmol/L — ABNORMAL LOW (ref 98–111)
Chloride: 96 mmol/L — ABNORMAL LOW (ref 98–111)
Creatinine, Ser: 1.75 mg/dL — ABNORMAL HIGH (ref 0.61–1.24)
Creatinine, Ser: 2.11 mg/dL — ABNORMAL HIGH (ref 0.61–1.24)
GFR, Estimated: 30 mL/min — ABNORMAL LOW (ref >60)
GFR, Estimated: 38 mL/min — ABNORMAL LOW (ref >60)
Glucose, Bld: 110 mg/dL — ABNORMAL HIGH (ref 70–99)
Glucose, Bld: 92 mg/dL (ref 70–99)
Potassium: 4.2 mmol/L (ref 3.5–5.1)
Potassium: 4.7 mmol/L (ref 3.5–5.1)
Sodium: 128 mmol/L — ABNORMAL LOW (ref 135–145)
Sodium: 131 mmol/L — ABNORMAL LOW (ref 135–145)

## 2021-06-05 LAB — MAGNESIUM: Magnesium: 2 mg/dL (ref 1.7–2.4)

## 2021-06-05 LAB — LIPID PANEL
Cholesterol: 101 mg/dL (ref 0–200)
HDL: 37 mg/dL — ABNORMAL LOW (ref >40)
LDL Cholesterol: 59 mg/dL (ref 0–99)
Total CHOL/HDL Ratio: 2.7 RATIO
Triglycerides: 26 mg/dL (ref <150)
VLDL: 5 mg/dL (ref 0–40)

## 2021-06-05 LAB — HEMOGLOBIN A1C
Hgb A1c MFr Bld: 5.6 % (ref 4.8–5.6)
Mean Plasma Glucose: 114 mg/dL

## 2021-06-05 MED ORDER — METHOCARBAMOL 500 MG PO TABS
500.0000 mg | ORAL_TABLET | Freq: Once | ORAL | Status: AC
  Administered 2021-06-05: 500 mg via ORAL
  Filled 2021-06-05: qty 1

## 2021-06-05 NOTE — Progress Notes (Signed)
[facility] Cardiology  SUBJECTIVE: Patient resting comfortably flat in bed, denies chest pain   Vitals:   06/04/21 2125 06/05/21 0109 06/05/21 0410 06/05/21 0830  BP:  (!) 164/59 (!) 156/58 (!) 159/74  Pulse:  (!) 53 (!) 53   Resp:  20 18   Temp:  97.8 F (36.6 C) 98.2 F (36.8 C) 97.6 F (36.4 C)  TempSrc:    Oral  SpO2:  97% 97% 99%  Weight:   59 kg   Height: '5\' 1"'$  (1.549 m)        Intake/Output Summary (Last 24 hours) at 06/05/2021 1011 Last data filed at 06/05/2021 [id number] Gross per 24 hour  Intake --  Output 400 ml  Net -400 ml      PHYSICAL EXAM  General: Well developed, well nourished, in no acute distress HEENT:  Normocephalic and atramatic Neck:  No JVD.  Lungs: Clear bilaterally to auscultation and percussion. Heart: HRRR . Normal S1 and S2 without gallops or murmurs.  Abdomen: Bowel sounds are positive, abdomen soft and non-tender  Msk:  Back normal, normal gait. Normal strength and tone for age. Extremities: No clubbing, cyanosis or edema.   Neuro: Alert and oriented X 3. Psych:  Good affect, responds appropriately   LABS: Basic Metabolic Panel: Recent Labs    06/05/21 0013 06/05/21 0820  NA 128* 131*  K 4.7 4.2  CL 96* 95*  CO2 24 26  GLUCOSE 110* 92  BUN 73* 71*  CREATININE 2.11* 1.75*  CALCIUM 8.3* 8.7*  MG 2.0  --    Liver Function Tests: Recent Labs    06/03/21 2311  AST 40  ALT 38  ALKPHOS 134*  BILITOT 1.0  PROT 6.8  ALBUMIN 3.3*   Recent Labs    06/03/21 2311  LIPASE 39   CBC: Recent Labs    06/03/21 2311  WBC 7.7  HGB 10.8*  HCT 31.9*  MCV 86.4  PLT 148*   Cardiac Enzymes: No results for input(s): CKTOTAL, CKMB, CKMBINDEX, TROPONINI in the last 72 hours. BNP: Invalid input(s): POCBNP D-Dimer: No results for input(s): DDIMER in the last 72 hours. Hemoglobin A1C: No results for input(s): HGBA1C in the last 72 hours. Fasting Lipid Panel: Recent Labs    06/05/21 0013  CHOL 101  HDL 37*  LDLCALC 59  TRIG 26   CHOLHDL 2.7   Thyroid Function Tests: No results for input(s): TSH, T4TOTAL, T3FREE, THYROIDAB in the last 72 hours.  Invalid input(s): FREET3 Anemia Panel: No results for input(s): VITAMINB12, FOLATE, FERRITIN, TIBC, IRON, RETICCTPCT in the last 72 hours.  CT ABDOMEN PELVIS WO CONTRAST  Result Date: 06/04/2021 CLINICAL DATA:  Abdominal distention for 3-4 days. EXAM: CT ABDOMEN AND PELVIS WITHOUT CONTRAST TECHNIQUE: Multidetector CT imaging of the abdomen and pelvis was performed following the standard protocol without IV contrast. COMPARISON:  12/11/2020 FINDINGS: Lower chest: Moderate right and small left pleural effusions. Hepatobiliary: No focal abnormality in the liver on this study without intravenous contrast. 12 mm calcified gallstone again noted. No intrahepatic or extrahepatic biliary dilation. Pancreas: No focal mass lesion. No dilatation of the main duct. No intraparenchymal cyst. Peripancreatic fat in the region of the pancreatic head is not well defined. Spleen: No splenomegaly. No focal mass lesion. Adrenals/Urinary Tract: No adrenal nodule or mass. Kidneys unremarkable. No evidence for hydroureter. The urinary bladder appears normal for the degree of distention. Stomach/Bowel: Stomach is unremarkable. No gastric wall thickening. No evidence of outlet obstruction. Duodenum is normally positioned as is the  ligament of [place]. Probable subtle periduodenal edema. No small bowel wall thickening. No small bowel dilatation. The terminal ileum is normal. The appendix is not well visualized, but there is no edema or inflammation in the region of the cecum. No gross colonic mass. No colonic wall thickening. Vascular/Lymphatic: Abdominal aortic aneurysm measures up to 4.4 cm diameter on today's study. Loss of fat plane between the duodenum in the aorta noted on today's exam. There is no gastrohepatic or hepatoduodenal ligament lymphadenopathy. No retroperitoneal or mesenteric lymphadenopathy. No  pelvic sidewall lymphadenopathy. Reproductive: Prostate gland is mildly enlarged. Other: Trace free fluid noted in the abdomen and pelvis. Musculoskeletal: Diffuse body wall edema. No worrisome lytic or sclerotic osseous abnormality. IMPRESSION: 1. Fat planes in the retroperitoneal space of the upper abdomen are not well preserved on today's study. As such, Peri duodenal or peripancreatic edema/inflammation would be a consideration, for example in the setting of duodenitis or pancreatitis. 2. Infrarenal abdominal aortic aneurysm measuring up to 4.4 cm today, increased from 4.1 cm on the 12/11/2020 exam. Haziness in the retroperitoneal fat extends around the aorta and fat plane between the anterior wall the aorta and the duodenum is not preserved on today's exam. In the setting of GI bleeding, aorto enteric fistula could have this appearance. Recommend follow-up every 12 months and vascular consultation. This recommendation follows ACR consensus guidelines: White Paper of the ACR Incidental Findings Committee II on Vascular Findings. J Am Coll Radiol 2013; 10:789-794. 3. Cholelithiasis. 4. Similar bilateral pleural effusions, right greater than left. 5.  Aortic Atherosclerois (ICD10-170.0) Electronically Signed   By: Misty Stanley M.D.   On: 06/04/2021 07:23   DG Chest 2 View  Result Date: 06/03/2021 CLINICAL DATA:  Shortness of breath EXAM: CHEST - 2 VIEW COMPARISON:  04/09/2021 FINDINGS: Cardiomegaly with mild interstitial edema. Small right pleural effusion. No pneumothorax. Postsurgical changes related to prior CABG. Thoracic aortic atherosclerosis. Median sternotomy. IMPRESSION: Cardiomegaly with mild interstitial edema and small right pleural effusion. Electronically Signed   By: Julian Hy M.D.   On: 06/03/2021 23:32   DG Abdomen 1 View  Result Date: 06/03/2021 CLINICAL DATA:  Abdominal distension EXAM: ABDOMEN - 1 VIEW COMPARISON:  None. FINDINGS: Nonobstructive bowel gas pattern. No evidence  of free air under the diaphragm. Normal colonic stool burden. Degenerative changes of the lumbar spine. IMPRESSION: Negative. Electronically Signed   By: Julian Hy M.D.   On: 06/03/2021 23:34   DG Hip Unilat W or Wo Pelvis 2-3 Views Left  Result Date: 06/04/2021 CLINICAL DATA:  Left hip pain for 3-4 days. EXAM: DG HIP (WITH OR WITHOUT PELVIS) 2-3V LEFT COMPARISON:  06/04/2021 abdominal CT FINDINGS: There is no evidence of hip fracture or dislocation. There is no evidence of arthropathy or other focal bone abnormality. Osteopenia and atherosclerosis. Postoperative left groin. IMPRESSION: No acute finding. Electronically Signed   By: Monte Fantasia M.D.   On: 06/04/2021 07:43     Echo   TELEMETRY: Sinus bradycardia 58 bpm:  ASSESSMENT AND PLAN:  Principal Problem:   Acute on chronic combined systolic and diastolic CHF (congestive heart failure) (HCC) Active Problems:   Coronary artery disease   Hypertensive urgency   Acute renal failure superimposed on stage 3b chronic kidney disease (HCC)   Normochromic normocytic anemia   Essential hypertension   Hyponatremia   Abdominal pain   Constipation   Stroke (HCC)   HLD (hyperlipidemia)   Hyperkalemia   AAA (abdominal aortic aneurysm) (HCC)   Elevated troponin  Left hip pain   Sinus bradycardia    1.  Acute on chronic systolic congestive heart failure, BNP 3266, chest x-ray revealing mild interstitial edema, recent 2D echocardiogram 04/10/2021 revealed LVEF 25 to 30% 2.  Sinus bradycardia, hemodynamically stable, on metoprolol succinate 50 mg daily 3.  Coronary artery disease, status post CABG x3, no chest pain, trivial elevation high-sensitivity troponin without delta (22, 22, 20) 4.  Infrarenal abdominal aortic aneurysm, 4.4 cm, seen by vascular surgery, recommends monitoring for now 5.  Chronic kidney disease, BUN/creatinine 71 and 1.75, respectively, stable after diuresis   Recommendations   1.  Agree with overall current  therapy 2.  Continue diuresis 3.  Carefully monitor renal status 4.  Resume metoprolol succinate 25 mg daily 5.  Defer full dose anticoagulation in the absence of chest pain    Isaias Cowman, MD, PhD, [facility] 06/05/2021 10:11 AM

## 2021-06-05 NOTE — Consult Note (Signed)
   Heart Failure Nurse Navigator Note  HFrEF25-30%.  Grade 1 diastolic dysfunction.   He presented to the emergency room with complaints of shortness of breath and constipation.  He also noted orthopnea and leg edema.  Comorbidities:  Hypertension GERD Chronic kidney disease stage III Coronary artery disease/CABG Anemia Hyperlipidemia  Medications:  Amlodipine 10 mg daily Aspirin 81 mg daily Atorvastatin 40 mg daily Furosemide 40 mg IV every 12  Labs:  Sodium 131, potassium 4.2, chloride 95, CO2 26, BUN 71, creatinine 1.75, BNP on admission was 3266, chest x-ray showed mild interstitial edema. Weight today 59 kg, when he was seen at the outpatient heart failure clinic weight was 61 kg.  He was awake and alert lying in bed in no acute distress.  Daughter who does not speak [place] and son who does speak English was at the bedside.  He was the interpreter.  Went over how he takes care of himself at home.  He states that he was weighing daily and recording the weight and had not noticed an increase.  Also went over fluid restriction of 64 ounces.  He states that he had not missed any of his medications.  Discussed diet, he does not use salt at the table, they do not eat out at restaurants.  Was given the Spanish version of living with heart failure and asked that he read that to let me know if he had any further questions.  He has an appointment with Darylene Price in the outpatient heart failure clinic on September 8 at 9:30 in the morning.  Will continue to follow along.   Pricilla Riffle RN [facility]

## 2021-06-05 NOTE — Evaluation (Signed)
Occupational Therapy Evaluation Patient Details Name: Daniel Bolton MRN: [id number] DOB: [birth year] Today's Date: 06/05/2021    History of Present Illness 87 y.o. male who presents to ER with shortness of breath and constipation. Dx with acute on chronic congestive heart failure. Medical history significant of CHF with EF 25-30%, hypertension, hyperlipidemia, stroke, GERD, CKD-3B, CAD, CABG, anemia, hyponatremia, AAA.   Clinical Impression   Pt seen for OT evaluation this date. [employer] interpreter utilized t/o conversation. Upon arrival to room, pt awake and seated upright in bed with daughter present throughout session. Multiple interruptions by providers during session, however pt pleasant and agreeable throughout. At baseline, pt is independent with ADLs and functional mobility, living in a 1-story home alone. Pt endorses 2 falls within the past 6 months. Pt's daughter assists with IADLs. Pt currently presents with decreased activity tolerance and strength. Due to these functional impairments, pt requires SUPERVISION for standing UB ADLs, SUPERVISION for seated LB dressing, and MIN GUARD for functional mobility of short household distances without AD (HR 50s-60s and SpO2 >95% throughout session). Pt educated on importance of implementing energy conservation strategies (i.e., seated rest breaks) during ADLs/functional mobility. Pt verbalized understanding and would benefit from additional skilled OT services to maximize recall and carryover of energy conservation strategies and facilitate implementation of learned techniques into daily routines. Upon discharge, recommend no OT follow-up.      Follow Up Recommendations  No OT follow up;Supervision - Intermittent    Equipment Recommendations  Tub/shower bench       Precautions / Restrictions Precautions Precautions: Fall Restrictions Weight Bearing Restrictions: No      Mobility Bed Mobility Overal bed mobility: Modified  Independent                  Transfers Overall transfer level: Needs assistance Equipment used: None Transfers: Sit to/from Stand Sit to Stand: Supervision         General transfer comment: SUP for safety using RW    Balance Overall balance assessment: Needs assistance;Mild deficits observed, not formally tested Sitting-balance support: No upper extremity supported;Feet supported Sitting balance-Leahy Scale: Good Sitting balance - Comments: Good balance reaching outside BOS to don/doff socks   Standing balance support: No upper extremity supported;During functional activity Standing balance-Leahy Scale: Fair Standing balance comment: SUPERVISION for standing at toilet to manipulate clothing and urinate                           ADL either performed or assessed with clinical judgement   ADL Overall ADL's : Needs assistance/impaired     Grooming: Wash/dry face;Oral care;Supervision/safety;Set up;Standing               Lower Body Dressing: Supervision/safety;Sitting/lateral leans Lower Body Dressing Details (indicate cue type and reason): to don/doff socks     Toileting- Clothing Manipulation and Hygiene: Supervision/safety;Sit to/from stand Toileting - Clothing Manipulation Details (indicate cue type and reason): SUPERVISION for standing at toilet to manipulate clothing and urinate     Functional mobility during ADLs: Min guard (MIN GUARD for short household distances without RW)       Vision Baseline Vision/History: 1 Wears glasses              Pertinent Vitals/Pain Pain Assessment: No/denies pain     Hand Dominance     Extremity/Trunk Assessment Upper Extremity Assessment Upper Extremity Assessment: Generalized weakness   Lower Extremity Assessment Lower Extremity Assessment: Generalized weakness  Communication Communication Communication: HOH;Interpreter utilized   Cognition Arousal/Alertness: Awake/alert Behavior  During Therapy: WFL for tasks assessed/performed Overall Cognitive Status: Within Functional Limits for tasks assessed                                 General Comments: Pleasant and agreeable throughout   General Comments  During session, HR 50s-60s and SpO2>95%    Exercises Other Exercises Other Exercises: Education on role of OT, POC, and d/c recommendations        Home Living Family/patient expects to be discharged to:: Private residence Living Arrangements: Alone Available Help at Discharge: Family;Available PRN/intermittently Type of Home: Mobile home Home Access: Stairs to enter Entrance Stairs-Number of Steps: 6 Entrance Stairs-Rails: Right Home Layout: One level     Bathroom Shower/Tub: Tub/shower unit         Home Equipment: Cane - single point;Walker - 2 wheels   Additional Comments: lives alone, daughter able to assist as needed.      Prior Functioning/Environment Level of Independence: Needs assistance  Gait / Transfers Assistance Needed: SPC for household amb. Endorses 2 falls within past 6 months. Pt states HHPT services were supopsed to come out to house after previous hospital d/c however they did not come out to house. ADL's / Homemaking Assistance Needed: Pt independent with ADLs. Family assists with IADLs   Comments: one fall 3 weeks ago due to LOB. Pt states [facility] services were supopsed to come out to house after previous hospital d/c however they did not come out to house.        OT Problem List: Decreased strength;Decreased activity tolerance;Impaired balance (sitting and/or standing);Decreased knowledge of use of DME or AE      OT Treatment/Interventions: Self-care/ADL training;Therapeutic exercise;Energy conservation;DME and/or AE instruction;Therapeutic activities;Patient/family education;Balance training    OT Goals(Current goals can be found in the care plan section) Acute Rehab OT Goals Patient Stated Goal: to get strong and  return to being independent OT Goal Formulation: With patient Time For Goal Achievement: 06/19/21 Potential to Achieve Goals: Good ADL Goals Pt Will Perform Grooming: with modified independence;standing Pt Will Transfer to Toilet: with modified independence;regular height toilet Pt Will Perform Toileting - Clothing Manipulation and hygiene: with modified independence;sitting/lateral leans  OT Frequency: Min 1X/week    AM-PAC OT "6 Clicks" Daily Activity     Outcome Measure Help from another person eating meals?: None Help from another person taking care of personal grooming?: None Help from another person toileting, which includes using toliet, bedpan, or urinal?: A Little Help from another person bathing (including washing, rinsing, drying)?: A Little Help from another person to put on and taking off regular upper body clothing?: None Help from another person to put on and taking off regular lower body clothing?: A Little 6 Click Score: 21   End of Session Equipment Utilized During Treatment: Gait belt Nurse Communication: Mobility status  Activity Tolerance: Patient tolerated treatment well Patient left: in chair;with call bell/phone within reach  OT Visit Diagnosis: Unsteadiness on feet (R26.81);History of falling (Z91.81)                Time: [id number] OT Time Calculation (min): 35 min Charges:  OT General Charges $OT Visit: 1 Visit OT Evaluation $OT Eval Moderate Complexity: 1 Mod OT Treatments $Self Care/Home Management : 23-37 mins  Fredirick Maudlin, OTR/L [place]

## 2021-06-05 NOTE — Progress Notes (Signed)
Progress Note    Daniel Bolton  [id number] DOB: [birth year]  DOA: 06/04/2021 PCP: [name], MD      Brief Narrative:    Medical records reviewed and are as summarized below:  Daniel Bolton is a 87 y.o. male  with medical history significant of CHF with EF 25-30%, hypertension, hyperlipidemia, stroke, GERD, CKD-3B, CAD, CABG, anemia, hyponatremia, AAA, who presents with shortness of breath and constipation.      Assessment/Plan:   Principal Problem:   Acute on chronic combined systolic and diastolic CHF (congestive heart failure) (HCC) Active Problems:   Coronary artery disease   Hypertensive urgency   Acute renal failure superimposed on stage 3b chronic kidney disease (HCC)   Normochromic normocytic anemia   Essential hypertension   Hyponatremia   Abdominal pain   Constipation   Stroke (HCC)   HLD (hyperlipidemia)   Hyperkalemia   AAA (abdominal aortic aneurysm) (HCC)   Elevated troponin   Left hip pain   Sinus bradycardia    Body mass index is 24.56 kg/m.   Acute on chronic combined systolic and diastolic CHF: Continue IV Lasix.  Monitor BMP, daily weight and urine output.  2D echo in July 2020 showed EF estimated at 25 to 30%.  Hypertensive emergency: Continue antihypertensives  CAD with elevated troponin: Elevated troponins likely due to demand ischemia.  Continue aspirin, Plavix and simvastatin.  Abdominal aortic aneurysm: Increased from 4.1 to 4.4 cm.  He has been evaluated by vascular surgeon who recommended outpatient follow-up.  CKD stage IIIb: Creatinine is stable.  Continue to monitor  Sinus bradycardia: Asymptomatic.  Continue to monitor on telemetry.  Hyponatremia: Improving  Abdominal pain and constipation: Improved  History of stroke  Hyperkalemia: Resolved  Spanish interpretation services via iPad was used for this encounter.  Diet Order             Diet regular Room service appropriate? Yes; Fluid  consistency: Thin; Fluid restriction: 1200 mL Fluid  Diet effective now                      Consultants: Cardiologist  Procedures: None    Medications:    amLODipine  10 mg Oral Daily   aspirin EC  81 mg Oral Daily   atorvastatin  40 mg Oral Daily   enoxaparin (LOVENOX) injection  30 mg Subcutaneous Q24H   feeding supplement  237 mL Oral BID BM   furosemide  40 mg Intravenous Q12H   polyethylene glycol  17 g Oral Daily   senna-docusate  1 tablet Oral BID   sodium chloride  1 g Oral BID   Continuous Infusions:   Anti-infectives (From admission, onward)    None              Family Communication/Anticipated D/C date and plan/Code Status   DVT prophylaxis: enoxaparin (LOVENOX) injection 30 mg Start: 06/04/21 1000     Code Status: Full Code  Family Communication: Daughter at bedside Disposition Plan:    Status is: Inpatient  Remains inpatient appropriate because:IV treatments appropriate due to intensity of illness or inability to take PO and Inpatient level of care appropriate due to severity of illness  Dispo: The patient is from: Home              Anticipated d/c is to: Home              Patient currently is not medically stable to d/c.   Difficult  to place patient No           Subjective:   Breathing is a little better.  No abdominal pain.  He only had a small bowel movement today.  His daughter and occupational therapist were at the bedside.  Objective:    Vitals:   06/05/21 0109 06/05/21 0410 06/05/21 0830 06/05/21 1613  BP: (!) 164/59 (!) 156/58 (!) 159/74 (!) 150/60  Pulse: (!) 53 (!) 53    Resp: '20 18  16  '$ Temp: 97.8 F (36.6 C) 98.2 F (36.8 C) 97.6 F (36.4 C) 98 F (36.7 C)  TempSrc:   Oral Oral  SpO2: 97% 97% 99% 100%  Weight:  59 kg    Height:       No data found.   Intake/Output Summary (Last 24 hours) at 06/05/2021 1654 Last data filed at 06/05/2021 1638 Gross per 24 hour  Intake 720 ml  Output 1725  ml  Net -1005 ml   Filed Weights   06/03/21 2302 06/05/21 0410  Weight: 61 kg 59 kg    Exam:  GEN: NAD SKIN: No rash EYES: EOMI ENT: MMM CV: RRR PULM: CTA B ABD: soft, ND, NT, +BS CNS: AAO x 3, non focal EXT: No edema or tenderness        Data Reviewed:   I have personally reviewed following labs and imaging studies:  Labs: Labs show the following:   Basic Metabolic Panel: Recent Labs  Lab 06/03/21 2311 06/04/21 0830 06/04/21 1659 06/05/21 0013 06/05/21 0820  NA 123* 129* 127* 128* 131*  K 5.3* 4.3 4.3 4.7 4.2  CL 92* 98 94* 96* 95*  CO2 '23 23 26 24 26  '$ GLUCOSE 107* 77 119* 110* 92  BUN 69* 61* 69* 73* 71*  CREATININE 2.08* 1.85* 2.02* 2.11* 1.75*  CALCIUM 8.4* 7.4* 8.3* 8.3* 8.7*  MG  --   --   --  2.0  --    GFR Estimated Creatinine Clearance: 22.8 mL/min (A) (by C-G formula based on SCr of 1.75 mg/dL (H)). Liver Function Tests: Recent Labs  Lab 06/03/21 2311  AST 40  ALT 38  ALKPHOS 134*  BILITOT 1.0  PROT 6.8  ALBUMIN 3.3*   Recent Labs  Lab 06/03/21 2311  LIPASE 39   No results for input(s): AMMONIA in the last 168 hours. Coagulation profile No results for input(s): INR, PROTIME in the last 168 hours.  CBC: Recent Labs  Lab 06/03/21 2311  WBC 7.7  HGB 10.8*  HCT 31.9*  MCV 86.4  PLT 148*   Cardiac Enzymes: No results for input(s): CKTOTAL, CKMB, CKMBINDEX, TROPONINI in the last 168 hours. BNP (last 3 results) No results for input(s): PROBNP in the last 8760 hours. CBG: No results for input(s): GLUCAP in the last 168 hours. D-Dimer: No results for input(s): DDIMER in the last 72 hours. Hgb A1c: No results for input(s): HGBA1C in the last 72 hours. Lipid Profile: Recent Labs    06/05/21 0013  CHOL 101  HDL 37*  LDLCALC 59  TRIG 26  CHOLHDL 2.7   Thyroid function studies: No results for input(s): TSH, T4TOTAL, T3FREE, THYROIDAB in the last 72 hours.  Invalid input(s): FREET3 Anemia work up: No results for  input(s): VITAMINB12, FOLATE, FERRITIN, TIBC, IRON, RETICCTPCT in the last 72 hours. Sepsis Labs: Recent Labs  Lab 06/03/21 2311  WBC 7.7    Microbiology Recent Results (from the past 240 hour(s))  Resp Panel by RT-PCR (Flu A&B, Covid) Nasopharyngeal Swab  Status: None   Collection Time: 06/04/21  6:30 AM   Specimen: Nasopharyngeal Swab; Nasopharyngeal(NP) swabs in vial transport medium  Result Value Ref Range Status   SARS Coronavirus 2 by RT PCR NEGATIVE NEGATIVE Final    Comment: (NOTE) SARS-CoV-2 target nucleic acids are NOT DETECTED.  The SARS-CoV-2 RNA is generally detectable in upper respiratory specimens during the acute phase of infection. The lowest concentration of SARS-CoV-2 viral copies this assay can detect is 138 copies/mL. A negative result does not preclude SARS-Cov-2 infection and should not be used as the sole basis for treatment or other patient management decisions. A negative result may occur with  improper specimen collection/handling, submission of specimen other than nasopharyngeal swab, presence of viral mutation(s) within the areas targeted by this assay, and inadequate number of viral copies(<138 copies/mL). A negative result must be combined with clinical observations, patient history, and epidemiological information. The expected result is Negative.  Fact Sheet for Patients:  EntrepreneurPulse.com.au  Fact Sheet for Healthcare Providers:  IncredibleEmployment.be  This test is no t yet approved or cleared by the [place] FDA and  has been authorized for detection and/or diagnosis of SARS-CoV-2 by FDA under an Emergency Use Authorization (EUA). This EUA will remain  in effect (meaning this test can be used) for the duration of the COVID-19 declaration under Section 564(b)(1) of the Act, 21 U.S.C.section 360bbb-3(b)(1), unless the authorization is terminated  or revoked sooner.       Influenza A by  PCR NEGATIVE NEGATIVE Final   Influenza B by PCR NEGATIVE NEGATIVE Final    Comment: (NOTE) The Xpert Xpress SARS-CoV-2/FLU/RSV plus assay is intended as an aid in the diagnosis of influenza from Nasopharyngeal swab specimens and should not be used as a sole basis for treatment. Nasal washings and aspirates are unacceptable for Xpert Xpress SARS-CoV-2/FLU/RSV testing.  Fact Sheet for Patients: EntrepreneurPulse.com.au  Fact Sheet for Healthcare Providers: IncredibleEmployment.be  This test is not yet approved or cleared by the [place] FDA and has been authorized for detection and/or diagnosis of SARS-CoV-2 by FDA under an Emergency Use Authorization (EUA). This EUA will remain in effect (meaning this test can be used) for the duration of the COVID-19 declaration under Section 564(b)(1) of the Act, 21 U.S.C. section 360bbb-3(b)(1), unless the authorization is terminated or revoked.  Performed at [facility], [address] [place], [place] [address]     Procedures and diagnostic studies:  CT ABDOMEN PELVIS WO CONTRAST  Result Date: 06/04/2021 CLINICAL DATA:  Abdominal distention for 3-4 days. EXAM: CT ABDOMEN AND PELVIS WITHOUT CONTRAST TECHNIQUE: Multidetector CT imaging of the abdomen and pelvis was performed following the standard protocol without IV contrast. COMPARISON:  12/11/2020 FINDINGS: Lower chest: Moderate right and small left pleural effusions. Hepatobiliary: No focal abnormality in the liver on this study without intravenous contrast. 12 mm calcified gallstone again noted. No intrahepatic or extrahepatic biliary dilation. Pancreas: No focal mass lesion. No dilatation of the main duct. No intraparenchymal cyst. Peripancreatic fat in the region of the pancreatic head is not well defined. Spleen: No splenomegaly. No focal mass lesion. Adrenals/Urinary Tract: No adrenal nodule or mass. Kidneys unremarkable. No evidence for  hydroureter. The urinary bladder appears normal for the degree of distention. Stomach/Bowel: Stomach is unremarkable. No gastric wall thickening. No evidence of outlet obstruction. Duodenum is normally positioned as is the ligament of Treitz. Probable subtle periduodenal edema. No small bowel wall thickening. No small bowel dilatation. The terminal ileum is normal. The appendix  is not well visualized, but there is no edema or inflammation in the region of the cecum. No gross colonic mass. No colonic wall thickening. Vascular/Lymphatic: Abdominal aortic aneurysm measures up to 4.4 cm diameter on today's study. Loss of fat plane between the duodenum in the aorta noted on today's exam. There is no gastrohepatic or hepatoduodenal ligament lymphadenopathy. No retroperitoneal or mesenteric lymphadenopathy. No pelvic sidewall lymphadenopathy. Reproductive: Prostate gland is mildly enlarged. Other: Trace free fluid noted in the abdomen and pelvis. Musculoskeletal: Diffuse body wall edema. No worrisome lytic or sclerotic osseous abnormality. IMPRESSION: 1. Fat planes in the retroperitoneal space of the upper abdomen are not well preserved on today's study. As such, Peri duodenal or peripancreatic edema/inflammation would be a consideration, for example in the setting of duodenitis or pancreatitis. 2. Infrarenal abdominal aortic aneurysm measuring up to 4.4 cm today, increased from 4.1 cm on the 12/11/2020 exam. Haziness in the retroperitoneal fat extends around the aorta and fat plane between the anterior wall the aorta and the duodenum is not preserved on today's exam. In the setting of GI bleeding, aorto enteric fistula could have this appearance. Recommend follow-up every 12 months and vascular consultation. This recommendation follows ACR consensus guidelines: White Paper of the ACR Incidental Findings Committee II on Vascular Findings. J Am Coll Radiol 2013; 10:789-794. 3. Cholelithiasis. 4. Similar bilateral pleural  effusions, right greater than left. 5.  Aortic Atherosclerois (ICD10-170.0) Electronically Signed   By: Misty Stanley M.D.   On: 06/04/2021 07:23   DG Chest 2 View  Result Date: 06/03/2021 CLINICAL DATA:  Shortness of breath EXAM: CHEST - 2 VIEW COMPARISON:  04/09/2021 FINDINGS: Cardiomegaly with mild interstitial edema. Small right pleural effusion. No pneumothorax. Postsurgical changes related to prior CABG. Thoracic aortic atherosclerosis. Median sternotomy. IMPRESSION: Cardiomegaly with mild interstitial edema and small right pleural effusion. Electronically Signed   By: Julian Hy M.D.   On: 06/03/2021 23:32   DG Abdomen 1 View  Result Date: 06/03/2021 CLINICAL DATA:  Abdominal distension EXAM: ABDOMEN - 1 VIEW COMPARISON:  None. FINDINGS: Nonobstructive bowel gas pattern. No evidence of free air under the diaphragm. Normal colonic stool burden. Degenerative changes of the lumbar spine. IMPRESSION: Negative. Electronically Signed   By: Julian Hy M.D.   On: 06/03/2021 23:34   DG Hip Unilat W or Wo Pelvis 2-3 Views Left  Result Date: 06/04/2021 CLINICAL DATA:  Left hip pain for 3-4 days. EXAM: DG HIP (WITH OR WITHOUT PELVIS) 2-3V LEFT COMPARISON:  06/04/2021 abdominal CT FINDINGS: There is no evidence of hip fracture or dislocation. There is no evidence of arthropathy or other focal bone abnormality. Osteopenia and atherosclerosis. Postoperative left groin. IMPRESSION: No acute finding. Electronically Signed   By: Monte Fantasia M.D.   On: 06/04/2021 07:43               LOS: 1 day   Elisama Thissen  [facility]   Pager on [email or web address]. If 7PM-7AM, please contact night-coverage at [email or web address]     06/05/2021, 4:54 PM

## 2021-06-05 NOTE — Plan of Care (Signed)
  Problem: Education: Goal: Knowledge of General Education information will improve Description Including pain rating scale, medication(s)/side effects and non-pharmacologic comfort measures Outcome: Progressing   Problem: Health Behavior/Discharge Planning: Goal: Ability to manage health-related needs will improve Outcome: Progressing   

## 2021-06-05 NOTE — TOC Initial Note (Signed)
Transition of Care [facility]) - Initial/Assessment Note    Patient Details  Name: Daniel Bolton MRN: [id number] Date of Birth: 1934/02/06  [facility] CM/SW Contact:    Alberteen Sam, LCSW Phone Number: 06/05/2021, 10:01 AM  Clinical Narrative:                  CSW notes patient presenting from home, was previously set up with [facility] home health for PT, RN, and aide with start date of 7/10, however was discharged from services prior to this admission. Patient has rolling walker at home and PCP of Dr. [name].   Per Tommi Rumps with [facility] they can accept patient once again for [facility] services if need is determined prior to dc, TOC will continue to follow for therapy recommendations.    Expected Discharge Plan: Home/Self Care Barriers to Discharge: Continued Medical Work up   Patient Goals and CMS Choice Patient states their goals for this hospitalization and ongoing recovery are:: to go home CMS Medicare.gov Compare Post Acute Care list provided to:: Patient Choice offered to / list presented to : Patient  Expected Discharge Plan and Services Expected Discharge Plan: Home/Self Care       Living arrangements for the past 2 months: Single Family Home                                      Prior Living Arrangements/Services Living arrangements for the past 2 months: Single Family Home Lives with:: Self Patient language and need for interpreter reviewed:: Yes        Need for Family Participation in Patient Care: Yes (Comment) Care giver support system in place?: Yes (comment)   Criminal Activity/Legal Involvement Pertinent to Current Situation/Hospitalization: No - Comment as needed  Activities of Daily Living Home Assistive Devices/Equipment: Cane (specify quad or straight) ADL Screening (condition at time of admission) Patient's cognitive ability adequate to safely complete daily activities?: Yes Is the patient deaf or have difficulty hearing?:  No Does the patient have difficulty seeing, even when wearing glasses/contacts?: No Does the patient have difficulty concentrating, remembering, or making decisions?: No Patient able to express need for assistance with ADLs?: Yes Does the patient have difficulty dressing or bathing?: No Independently performs ADLs?: Yes (appropriate for developmental age) Does the patient have difficulty walking or climbing stairs?: No Weakness of Legs: None Weakness of Arms/Hands: None  Permission Sought/Granted                  Emotional Assessment       Orientation: : Oriented to Self, Oriented to Place, Oriented to  Time, Oriented to Situation Alcohol / Substance Use: Not Applicable Psych Involvement: No (comment)  Admission diagnosis:  Hyponatremia [E87.1] SOB (shortness of breath) [R06.02] Left hip pain [M25.552] Acute on chronic combined systolic and diastolic CHF (congestive heart failure) (HCC) [I50.43] Constipation, unspecified constipation type [K59.00] Abdominal aortic aneurysm (AAA) without rupture ([place]) [I71.4] Hypertension, unspecified type [I10] Patient Active Problem List   Diagnosis Date Noted   Acute on chronic combined systolic and diastolic CHF (congestive heart failure) ([place]) 06/04/2021   Abdominal pain 06/04/2021   Constipation 06/04/2021   Left hip pain 06/04/2021   Sinus bradycardia 06/04/2021   Stroke ([place])    HLD (hyperlipidemia)    Hyperkalemia    AAA (abdominal aortic aneurysm) (HCC)    Elevated troponin    Protein-calorie malnutrition, severe 04/12/2021  Essential hypertension 03/22/2021   Acute on chronic diastolic CHF (congestive heart failure) ([place]) 03/22/2021   Hyponatremia 03/22/2021   Acute on chronic systolic CHF (congestive heart failure) ([place]) 03/11/2021   Hypertensive urgency 03/11/2021   Acute renal failure superimposed on stage 3b chronic kidney disease ([place]) 03/11/2021   Normochromic normocytic anemia 03/11/2021   Acute CHF (congestive  heart failure) ([place]) 03/11/2021   Moderate major depression, single episode ([place]) 08/01/2015   Coronary artery disease 08/01/2015   Financial problems 08/01/2015   PCP:  [name], MD Pharmacy:   [id number] [place]Phone: [phone]: 3[place]Phone: [phone]: [phone]    Social Determinants of Health (SDOH) Interventions    Readmission Risk Interventions No flowsheet data found.

## 2021-06-05 NOTE — Evaluation (Signed)
Physical Therapy Evaluation Patient Details Name: Daniel Bolton MRN: [id number] DOB: 27-Jan-1934 Today's Date: 06/05/2021   History of Present Illness  87 y.o. male who presents to ER with shortness of breath and constipation. Dx with acute on chronic congestive heart failure. Medical history significant of CHF with EF 25-30%, hypertension, hyperlipidemia, stroke, GERD, CKD-3B, CAD, CABG, anemia, hyponatremia, AAA.  Clinical Impression  Pt received supine in bed, agreeable to therapy. Spanish interpreter utilized to obtain history and provide pt education. Pt was nauseous upon PT entering room; states he has not been nauseous however the symptoms relieved after 3 minutes. Pt performed bed mobility Mod I and was able to reposition in bed at end of session with VC. STS and ambulation performed with SUP and RW for safety. SOB was noted during final 23f of ambulation. HR also trended downward by end of ambulation, with a high of 82 bpm and ending at 66bpm by completion. RN notified. He does present with impaired functional endurance. Would benefit from skilled PT to address above deficits and promote optimal return to PLOF.     Follow Up Recommendations Home health PT;Supervision for mobility/OOB    Equipment Recommendations  None recommended by PT    Recommendations for Other Services       Precautions / Restrictions Precautions Precautions: Fall Restrictions Weight Bearing Restrictions: No      Mobility  Bed Mobility Overal bed mobility: Modified Independent                  Transfers Overall transfer level: Needs assistance Equipment used: Rolling walker (2 wheeled) Transfers: Sit to/from Stand Sit to Stand: Supervision         General transfer comment: SUP for safety using RW  Ambulation/Gait Ambulation/Gait assistance: Supervision Gait Distance (Feet): 200 Feet Assistive device: Rolling walker (2 wheeled) Gait Pattern/deviations: Step-through  pattern;Narrow base of support;Trunk flexed Gait velocity: decreased   General Gait Details: SOB noted during final 520f HR varied between 66-82 with it trending down into the 60's at end of ambulation.  Stairs            Wheelchair Mobility    Modified Rankin (Stroke Patients Only)       Balance Overall balance assessment: Needs assistance;Mild deficits observed, not formally tested Sitting-balance support: No upper extremity supported Sitting balance-Leahy Scale: Good     Standing balance support: No upper extremity supported Standing balance-Leahy Scale: Fair Standing balance comment: no UE support as pt adjusted mask, no LOB                             Pertinent Vitals/Pain Pain Assessment: No/denies pain    Home Living Family/patient expects to be discharged to:: Private residence Living Arrangements: Alone Available Help at Discharge: Family;Available PRN/intermittently Type of Home: Mobile home Home Access: Stairs to enter Entrance Stairs-Rails: Right Entrance Stairs-Number of Steps: 6 Home Layout: One level Home Equipment: Cane - single point;Walker - 2 wheels Additional Comments: lives alone, daughter able to assist as needed.    Prior Function Level of Independence: Needs assistance   Gait / Transfers Assistance Needed: SPC for household amb  ADL's / Homemaking Assistance Needed: Pt does not drive or cook, daughters drive to appointments and bring meals  Comments: one fall 3 weeks ago due to LOB. Pt states [employer] were supopsed to come out to house after previous hospital d/c however they did not come out to house.  Hand Dominance        Extremity/Trunk Assessment   Upper Extremity Assessment Upper Extremity Assessment: Generalized weakness    Lower Extremity Assessment Lower Extremity Assessment: Generalized weakness       Communication   Communication: HOH;Interpreter utilized  Cognition Arousal/Alertness:  Awake/alert Behavior During Therapy: WFL for tasks assessed/performed Overall Cognitive Status: Within Functional Limits for tasks assessed                                        General Comments      Exercises Other Exercises Other Exercises: PT utilized Spanish interpreter to educate on PT role, POC, safety with mobility and to obtain history. Pt asked about "bone-on-bone" feeling in left hip - PT explained possibly of OA and recommended consulting an orthopedic doctor.   Assessment/Plan    PT Assessment Patient needs continued PT services  PT Problem List Decreased strength;Decreased activity tolerance;Decreased safety awareness;Decreased balance;Cardiopulmonary status limiting activity;Decreased mobility       PT Treatment Interventions Gait training;Balance training;Stair training;Neuromuscular re-education;Functional mobility training;Patient/family education;Therapeutic activities;Therapeutic exercise    PT Goals (Current goals can be found in the Care Plan section)  Acute Rehab PT Goals Patient Stated Goal: to get strong and return to being independent PT Goal Formulation: With patient Time For Goal Achievement: 06/19/21 Potential to Achieve Goals: Good    Frequency Min 2X/week   Barriers to discharge        Co-evaluation               AM-PAC PT "6 Clicks" Mobility  Outcome Measure Help needed turning from your back to your side while in a flat bed without using bedrails?: None Help needed moving from lying on your back to sitting on the side of a flat bed without using bedrails?: None Help needed moving to and from a bed to a chair (including a wheelchair)?: A Little Help needed standing up from a chair using your arms (e.g., wheelchair or bedside chair)?: A Little Help needed to walk in hospital room?: A Little Help needed climbing 3-5 steps with a railing? : A Little 6 Click Score: 20    End of Session Equipment Utilized During  Treatment: Gait belt Activity Tolerance: Patient tolerated treatment well;Other (comment) (SOB during ambulation) Patient left: in bed;with call bell/phone within reach;with family/visitor present Nurse Communication: Other (comment) (need for interpreter, questions re: constipation and hip pain) PT Visit Diagnosis: Unsteadiness on feet (R26.81);Difficulty in walking, not elsewhere classified (R26.2);History of falling (Z91.81);Muscle weakness (generalized) (M62.81)    Time: [id number] PT Time Calculation (min) (ACUTE ONLY): 41 min   Charges:   PT Evaluation $PT Eval Moderate Complexity: 1 Mod PT Treatments $Therapeutic Activity: 8-22 mins        Patrina Levering PT, DPT 06/05/21 11:24 AM [id number]

## 2021-06-06 DIAGNOSIS — Z8673 Personal history of transient ischemic attack (TIA), and cerebral infarction without residual deficits: Secondary | ICD-10-CM | POA: Diagnosis not present

## 2021-06-06 DIAGNOSIS — E871 Hypo-osmolality and hyponatremia: Secondary | ICD-10-CM | POA: Diagnosis present

## 2021-06-06 DIAGNOSIS — E785 Hyperlipidemia, unspecified: Secondary | ICD-10-CM | POA: Diagnosis not present

## 2021-06-06 DIAGNOSIS — Z79899 Other long term (current) drug therapy: Secondary | ICD-10-CM | POA: Diagnosis not present

## 2021-06-06 DIAGNOSIS — Z8249 Family history of ischemic heart disease and other diseases of the circulatory system: Secondary | ICD-10-CM | POA: Diagnosis not present

## 2021-06-06 DIAGNOSIS — D631 Anemia in chronic kidney disease: Secondary | ICD-10-CM | POA: Diagnosis not present

## 2021-06-06 DIAGNOSIS — Z7982 Long term (current) use of aspirin: Secondary | ICD-10-CM | POA: Diagnosis not present

## 2021-06-06 DIAGNOSIS — R778 Other specified abnormalities of plasma proteins: Secondary | ICD-10-CM | POA: Diagnosis not present

## 2021-06-06 DIAGNOSIS — Z20822 Contact with and (suspected) exposure to covid-19: Secondary | ICD-10-CM | POA: Diagnosis not present

## 2021-06-06 DIAGNOSIS — I714 Abdominal aortic aneurysm, without rupture: Secondary | ICD-10-CM | POA: Diagnosis not present

## 2021-06-06 DIAGNOSIS — M25552 Pain in left hip: Secondary | ICD-10-CM | POA: Diagnosis not present

## 2021-06-06 DIAGNOSIS — N179 Acute kidney failure, unspecified: Secondary | ICD-10-CM | POA: Diagnosis not present

## 2021-06-06 DIAGNOSIS — I5043 Acute on chronic combined systolic (congestive) and diastolic (congestive) heart failure: Secondary | ICD-10-CM | POA: Diagnosis not present

## 2021-06-06 DIAGNOSIS — I130 Hypertensive heart and chronic kidney disease with heart failure and stage 1 through stage 4 chronic kidney disease, or unspecified chronic kidney disease: Secondary | ICD-10-CM | POA: Diagnosis not present

## 2021-06-06 DIAGNOSIS — I161 Hypertensive emergency: Secondary | ICD-10-CM | POA: Diagnosis not present

## 2021-06-06 DIAGNOSIS — K219 Gastro-esophageal reflux disease without esophagitis: Secondary | ICD-10-CM | POA: Diagnosis not present

## 2021-06-06 DIAGNOSIS — E875 Hyperkalemia: Secondary | ICD-10-CM | POA: Diagnosis not present

## 2021-06-06 DIAGNOSIS — Z951 Presence of aortocoronary bypass graft: Secondary | ICD-10-CM | POA: Diagnosis not present

## 2021-06-06 DIAGNOSIS — I2510 Atherosclerotic heart disease of native coronary artery without angina pectoris: Secondary | ICD-10-CM | POA: Diagnosis not present

## 2021-06-06 DIAGNOSIS — Z7902 Long term (current) use of antithrombotics/antiplatelets: Secondary | ICD-10-CM | POA: Diagnosis not present

## 2021-06-06 DIAGNOSIS — R001 Bradycardia, unspecified: Secondary | ICD-10-CM | POA: Diagnosis not present

## 2021-06-06 DIAGNOSIS — K5900 Constipation, unspecified: Secondary | ICD-10-CM | POA: Diagnosis not present

## 2021-06-06 DIAGNOSIS — N1832 Chronic kidney disease, stage 3b: Secondary | ICD-10-CM | POA: Diagnosis not present

## 2021-06-06 DIAGNOSIS — M545 Low back pain, unspecified: Secondary | ICD-10-CM | POA: Diagnosis not present

## 2021-06-06 LAB — BASIC METABOLIC PANEL
Anion gap: 7 (ref 5–15)
BUN: 64 mg/dL — ABNORMAL HIGH (ref 8–23)
CO2: 27 mmol/L (ref 22–32)
Calcium: 8.7 mg/dL — ABNORMAL LOW (ref 8.9–10.3)
Chloride: 98 mmol/L (ref 98–111)
Creatinine, Ser: 1.64 mg/dL — ABNORMAL HIGH (ref 0.61–1.24)
GFR, Estimated: 41 mL/min — ABNORMAL LOW (ref >60)
Glucose, Bld: 80 mg/dL (ref 70–99)
Potassium: 3.7 mmol/L (ref 3.5–5.1)
Sodium: 132 mmol/L — ABNORMAL LOW (ref 135–145)

## 2021-06-06 LAB — MAGNESIUM: Magnesium: 2 mg/dL (ref 1.7–2.4)

## 2021-06-06 MED ORDER — METOPROLOL SUCCINATE ER 50 MG PO TB24: 25.0000 mg | ORAL_TABLET | Freq: Every day | ORAL | Status: DC

## 2021-06-06 MED ORDER — FLUTICASONE PROPIONATE 50 MCG/ACT NA SUSP
1.0000 | Freq: Every day | NASAL | Status: DC
  Administered 2021-06-06: 1 via NASAL
  Filled 2021-06-06: qty 16

## 2021-06-06 MED ORDER — AMLODIPINE BESYLATE 10 MG PO TABS: 10.0000 mg | ORAL_TABLET | Freq: Every day | ORAL | 0 refills | Status: DC

## 2021-06-06 MED ORDER — METOPROLOL SUCCINATE ER 25 MG PO TB24
25.0000 mg | ORAL_TABLET | Freq: Every day | ORAL | Status: DC
  Administered 2021-06-06: 25 mg via ORAL
  Filled 2021-06-06: qty 1

## 2021-06-06 MED ORDER — SORBITOL 70 % SOLN
960.0000 mL | TOPICAL_OIL | Freq: Once | ORAL | Status: AC
  Administered 2021-06-06: 960 mL via RECTAL
  Filled 2021-06-06: qty 473

## 2021-06-06 NOTE — Discharge Summary (Addendum)
Physician Discharge Summary  Daniel Bolton [id number] DOB: 1934-05-02 DOA: 06/04/2021  PCP: Daniel Pitch, MD  Admit date: 06/04/2021 Discharge date: 06/06/2021  Discharge disposition: Home with home health therapy   Recommendations for Outpatient Follow-Up:   Follow-up with Dr. Ubaldo Bolton, cardiologist, as scheduled on 06/11/2021. Follow-up with Dr. Delana Bolton, vascular surgeon in 2 weeks   Discharge Diagnosis:   Principal Problem:   Acute on chronic combined systolic and diastolic CHF (congestive heart failure) (HCC) Active Problems:   Coronary artery disease   Hypertensive urgency   Acute renal failure superimposed on stage 3b chronic kidney disease (HCC)   Normochromic normocytic anemia   Essential hypertension   Hyponatremia   Abdominal pain   Constipation   Stroke (HCC)   HLD (hyperlipidemia)   Hyperkalemia   AAA (abdominal aortic aneurysm) (HCC)   Elevated troponin   Left hip pain   Sinus bradycardia    Discharge Condition: Stable.  Diet recommendation:  Diet Order             Diet - low sodium heart healthy           Diet regular Room service appropriate? Yes; Fluid consistency: Thin; Fluid restriction: 1200 mL Fluid  Diet effective now                     Code Status: Full Code     Hospital Course:   [name] is a 87 y.o. male  with medical history significant of CHF with EF 25-30%, hypertension, hyperlipidemia, stroke, GERD, CKD-3B, CAD, CABG, anemia, hyponatremia, AAA, to the hospital with shortness of breath, abdominal pain and constipation.  He was admitted to the hospital for acute on chronic combined systolic and diastolic CHF and hypertensive emergency (BP was 205/99).  He was also bradycardic with a heart rate in the 40s.  He was treated with IV Lasix.  He was on Toprol-XL 50 mg daily prior to admission but this was decreased to 25 mg daily because of bradycardia.  He was found to have an increase in size of abdominal  aortic aneurysm from 4.1 to 4.4 cm longest period of 5 months.  Dr. Delana Bolton, vascular surgeon, was consulted and he recommended outpatient follow-up.  He was given laxatives for constipation and he has been able to move his bowels.  Overall, his condition has improved and he is deemed stable for discharge to home today.  Spanish interpretation services via iPad was used for this encounter.  His nurse was at the bedside during this encounter.    Medical Consultants:   Cardiologist, Dr. Saralyn Bolton Vascular surgeon, Dr. Delana Bolton   Discharge Exam:    Vitals:   06/05/21 1956 06/05/21 2348 06/06/21 0425 06/06/21 0945  BP: (!) 151/54 (!) 160/90 (!) 156/58 (!) 144/50  Pulse: 60 72 (!) 55 74  Resp: '17 17 19   '$ Temp: 98.3 F (36.8 C) 97.6 F (36.4 C) 98.4 F (36.9 C) 97.6 F (36.4 C)  TempSrc:    Oral  SpO2: 95% 96% 97% 97%  Weight:   55.6 kg   Height:         GEN: NAD SKIN: No rash EYES: EOMI ENT: MMM CV: RRR PULM: CTA B ABD: soft, ND, NT, +BS CNS: AAO x 3, non focal EXT: No edema or tenderness   The results of significant diagnostics from this hospitalization (including imaging, microbiology, ancillary and laboratory) are listed below for reference.     Procedures and Diagnostic Studies:  CT ABDOMEN PELVIS WO CONTRAST  Result Date: 06/04/2021 CLINICAL DATA:  Abdominal distention for 3-4 days. EXAM: CT ABDOMEN AND PELVIS WITHOUT CONTRAST TECHNIQUE: Multidetector CT imaging of the abdomen and pelvis was performed following the standard protocol without IV contrast. COMPARISON:  12/11/2020 FINDINGS: Lower chest: Moderate right and small left pleural effusions. Hepatobiliary: No focal abnormality in the liver on this study without intravenous contrast. 12 mm calcified gallstone again noted. No intrahepatic or extrahepatic biliary dilation. Pancreas: No focal mass lesion. No dilatation of the main duct. No intraparenchymal cyst. Peripancreatic fat in the region of the pancreatic  head is not well defined. Spleen: No splenomegaly. No focal mass lesion. Adrenals/Urinary Tract: No adrenal nodule or mass. Kidneys unremarkable. No evidence for hydroureter. The urinary bladder appears normal for the degree of distention. Stomach/Bowel: Stomach is unremarkable. No gastric wall thickening. No evidence of outlet obstruction. Duodenum is normally positioned as is the ligament of Treitz. Probable subtle periduodenal edema. No small bowel wall thickening. No small bowel dilatation. The terminal ileum is normal. The appendix is not well visualized, but there is no edema or inflammation in the region of the cecum. No gross colonic mass. No colonic wall thickening. Vascular/Lymphatic: Abdominal aortic aneurysm measures up to 4.4 cm diameter on today's study. Loss of fat plane between the duodenum in the aorta noted on today's exam. There is no gastrohepatic or hepatoduodenal ligament lymphadenopathy. No retroperitoneal or mesenteric lymphadenopathy. No pelvic sidewall lymphadenopathy. Reproductive: Prostate gland is mildly enlarged. Other: Trace free fluid noted in the abdomen and pelvis. Musculoskeletal: Diffuse body wall edema. No worrisome lytic or sclerotic osseous abnormality. IMPRESSION: 1. Fat planes in the retroperitoneal space of the upper abdomen are not well preserved on today's study. As such, Peri duodenal or peripancreatic edema/inflammation would be a consideration, for example in the setting of duodenitis or pancreatitis. 2. Infrarenal abdominal aortic aneurysm measuring up to 4.4 cm today, increased from 4.1 cm on the 12/11/2020 exam. Haziness in the retroperitoneal fat extends around the aorta and fat plane between the anterior wall the aorta and the duodenum is not preserved on today's exam. In the setting of GI bleeding, aorto enteric fistula could have this appearance. Recommend follow-up every 12 months and vascular consultation. This recommendation follows ACR consensus guidelines:  White Paper of the ACR Incidental Findings Committee II on Vascular Findings. J Am Coll Radiol 2013; 10:789-794. 3. Cholelithiasis. 4. Similar bilateral pleural effusions, right greater than left. 5.  Aortic Atherosclerois (ICD10-170.0) Electronically Signed   By: Misty Stanley M.D.   On: 06/04/2021 07:23   DG Hip Unilat W or Wo Pelvis 2-3 Views Left  Result Date: 06/04/2021 CLINICAL DATA:  Left hip pain for 3-4 days. EXAM: DG HIP (WITH OR WITHOUT PELVIS) 2-3V LEFT COMPARISON:  06/04/2021 abdominal CT FINDINGS: There is no evidence of hip fracture or dislocation. There is no evidence of arthropathy or other focal bone abnormality. Osteopenia and atherosclerosis. Postoperative left groin. IMPRESSION: No acute finding. Electronically Signed   By: Monte Fantasia M.D.   On: 06/04/2021 07:43     Labs:   Basic Metabolic Panel: Recent Labs  Lab 06/04/21 0830 06/04/21 1659 06/05/21 0013 06/05/21 0820 06/06/21 0423  NA 129* 127* 128* 131* 132*  K 4.3 4.3 4.7 4.2 3.7  CL 98 94* 96* 95* 98  CO2 '23 26 24 26 27  '$ GLUCOSE 77 119* 110* 92 80  BUN 61* 69* 73* 71* 64*  CREATININE 1.85* 2.02* 2.11* 1.75* 1.64*  CALCIUM 7.4* 8.3*  8.3* 8.7* 8.7*  MG  --   --  2.0  --  2.0   GFR Estimated Creatinine Clearance: 24.4 mL/min (A) (by C-G formula based on SCr of 1.64 mg/dL (H)). Liver Function Tests: Recent Labs  Lab 06/03/21 2311  AST 40  ALT 38  ALKPHOS 134*  BILITOT 1.0  PROT 6.8  ALBUMIN 3.3*   Recent Labs  Lab 06/03/21 2311  LIPASE 39   No results for input(s): AMMONIA in the last 168 hours. Coagulation profile No results for input(s): INR, PROTIME in the last 168 hours.  CBC: Recent Labs  Lab 06/03/21 2311  WBC 7.7  HGB 10.8*  HCT 31.9*  MCV 86.4  PLT 148*   Cardiac Enzymes: No results for input(s): CKTOTAL, CKMB, CKMBINDEX, TROPONINI in the last 168 hours. BNP: Invalid input(s): POCBNP CBG: No results for input(s): GLUCAP in the last 168 hours. D-Dimer No results for  input(s): DDIMER in the last 72 hours. Hgb A1c Recent Labs    06/04/21 1659  HGBA1C 5.6   Lipid Profile Recent Labs    06/05/21 0013  CHOL 101  HDL 37*  LDLCALC 59  TRIG 26  CHOLHDL 2.7   Thyroid function studies No results for input(s): TSH, T4TOTAL, T3FREE, THYROIDAB in the last 72 hours.  Invalid input(s): FREET3 Anemia work up No results for input(s): VITAMINB12, FOLATE, FERRITIN, TIBC, IRON, RETICCTPCT in the last 72 hours. Microbiology Recent Results (from the past 240 hour(s))  Resp Panel by RT-PCR (Flu A&B, Covid) Nasopharyngeal Swab     Status: None   Collection Time: 06/04/21  6:30 AM   Specimen: Nasopharyngeal Swab; Nasopharyngeal(NP) swabs in vial transport medium  Result Value Ref Range Status   SARS Coronavirus 2 by RT PCR NEGATIVE NEGATIVE Final    Comment: (NOTE) SARS-CoV-2 target nucleic acids are NOT DETECTED.  The SARS-CoV-2 RNA is generally detectable in upper respiratory specimens during the acute phase of infection. The lowest concentration of SARS-CoV-2 viral copies this assay can detect is 138 copies/mL. A negative result does not preclude SARS-Cov-2 infection and should not be used as the sole basis for treatment or other patient management decisions. A negative result may occur with  improper specimen collection/handling, submission of specimen other than nasopharyngeal swab, presence of viral mutation(s) within the areas targeted by this assay, and inadequate number of viral copies(<138 copies/mL). A negative result must be combined with clinical observations, patient history, and epidemiological information. The expected result is Negative.  Fact Sheet for Patients:  EntrepreneurPulse.com.au  Fact Sheet for Healthcare Providers:  IncredibleEmployment.be  This test is no t yet approved or cleared by the [place] FDA and  has been authorized for detection and/or diagnosis of SARS-CoV-2 by FDA under  an Emergency Use Authorization (EUA). This EUA will remain  in effect (meaning this test can be used) for the duration of the COVID-19 declaration under Section 564(b)(1) of the Act, 21 U.S.C.section 360bbb-3(b)(1), unless the authorization is terminated  or revoked sooner.       Influenza A by PCR NEGATIVE NEGATIVE Final   Influenza B by PCR NEGATIVE NEGATIVE Final    Comment: (NOTE) The Xpert Xpress SARS-CoV-2/FLU/RSV plus assay is intended as an aid in the diagnosis of influenza from Nasopharyngeal swab specimens and should not be used as a sole basis for treatment. Nasal washings and aspirates are unacceptable for Xpert Xpress SARS-CoV-2/FLU/RSV testing.  Fact Sheet for Patients: EntrepreneurPulse.com.au  Fact Sheet for Healthcare Providers: IncredibleEmployment.be  This test is not yet  approved or cleared by the [place] and has been authorized for detection and/or diagnosis of SARS-CoV-2 by FDA under an Emergency Use Authorization (EUA). This EUA will remain in effect (meaning this test can be used) for the duration of the COVID-19 declaration under Section 564(b)(1) of the Act, 21 U.S.C. section 360bbb-3(b)(1), unless the authorization is terminated or revoked.  Performed at [facility], [address], [place], [place] [address]      Discharge Instructions:   Discharge Instructions     Diet - low sodium heart healthy   Complete by: As directed    Face-to-face encounter (required for Medicare/Medicaid patients)   Complete by: As directed    I [place] certify that this patient is under my care and that I, or a nurse practitioner or physician's assistant working with me, had a face-to-face encounter that meets the physician face-to-face encounter requirements with this patient on 06/06/2021. The encounter with the patient was in whole, or in part for the following medical condition(s) which is the primary  reason for home health care (List medical condition): CHF, debility   The encounter with the patient was in whole, or in part, for the following medical condition, which is the primary reason for home health care: CHF, debility   I certify that, based on my findings, the following services are medically necessary home health services: Physical therapy   Reason for Medically Necessary Home Health Services: Therapy- Personnel officer, Public librarian   My clinical findings support the need for the above services: Shortness of breath with activity   Further, I certify that my clinical findings support that this patient is homebound due to: Shortness of Breath with activity   Home Health   Complete by: As directed    To provide the following care/treatments:  PT [place] OT     Increase activity slowly   Complete by: As directed       Allergies as of 06/06/2021       Reactions   Hydralazine    Pt has severe dizziness with it, and does not want to take it.        Medication List     STOP taking these medications    albuterol 108 (90 Base) MCG/ACT inhaler Commonly known as: VENTOLIN HFA   omeprazole 20 MG capsule Commonly known as: PRILOSEC   pantoprazole 40 MG tablet Commonly known as: PROTONIX       TAKE these medications    amLODipine 10 MG tablet Commonly known as: NORVASC Take 1 tablet (10 mg total) by mouth daily. Start taking on: June 07, 2021   aspirin 81 MG EC tablet Take 1 tablet (81 mg total) by mouth daily. Swallow whole.   clopidogrel 75 MG tablet Commonly known as: PLAVIX Take 75 mg by mouth daily.   feeding supplement Liqd Take 237 mLs by mouth 2 (two) times daily between meals.   furosemide 40 MG tablet Commonly known as: LASIX Take 1 tablet (40 mg total) by mouth daily.   metoprolol succinate 50 MG 24 hr tablet Commonly known as: TOPROL-XL Take 0.5 tablets (25 mg total) by mouth daily. Take with or immediately  following a meal. What changed: how much to take   olmesartan 20 MG tablet Commonly known as: BENICAR Take 20 mg by mouth daily.   simvastatin 80 MG tablet Commonly known as: ZOCOR Take 80 mg by mouth daily.  Follow-up Information     Schnier, Dolores Lory, MD Follow up in 2 week(s).   Specialties: Vascular Surgery, Cardiology, Radiology, Vascular Surgery Why: Seen as consult. To see Schnier. Discuss AAA. No studies. Contact information: [place] [address] [id number]         Teodoro Spray, MD Follow up on 06/11/2021.   Specialty: Cardiology Why: @ 9am Contact information: [place] [address] [phone]                  Time coordinating discharge: 31 minutes  Signed:  Jennye Boroughs  [facility] 06/06/2021, 3:29 PM   Pager on [email or web address]. If 7PM-7AM, please contact night-coverage at [email or web address]

## 2021-06-06 NOTE — Progress Notes (Signed)
[facility]  SUBJECTIVE: Patient sitting on side of the bed, eating breakfast   Vitals:   06/05/21 1613 06/05/21 1956 06/05/21 2348 06/06/21 0425  BP: (!) 150/60 (!) 151/54 (!) 160/90 (!) 156/58  Pulse:  60 72 (!) 55  Resp: '16 17 17 19  '$ Temp: 98 F (36.7 C) 98.3 F (36.8 C) 97.6 F (36.4 C) 98.4 F (36.9 C)  TempSrc: Oral     SpO2: 100% 95% 96% 97%  Weight:    55.6 kg  Height:         Intake/Output Summary (Last 24 hours) at 06/06/2021 0913 Last data filed at 06/06/2021 0827 Gross per 24 hour  Intake 960 ml  Output 2525 ml  Net -1565 ml      PHYSICAL EXAM  General: Well developed, well nourished, in no acute distress HEENT:  Normocephalic and atramatic Neck:  No JVD.  Lungs: Clear bilaterally to auscultation and percussion. Heart: HRRR . Normal S1 and S2 without gallops or murmurs.  Abdomen: Bowel sounds are positive, abdomen soft and non-tender  Msk:  Back normal, normal gait. Normal strength and tone for age. Extremities: No clubbing, cyanosis or edema.   Neuro: Alert and oriented X 3. Psych:  Good affect, responds appropriately   LABS: Basic Metabolic Panel: Recent Labs    06/05/21 0013 06/05/21 0820 06/06/21 0423  NA 128* 131* 132*  K 4.7 4.2 3.7  CL 96* 95* 98  CO2 '24 26 27  '$ GLUCOSE 110* 92 80  BUN 73* 71* 64*  CREATININE 2.11* 1.75* 1.64*  CALCIUM 8.3* 8.7* 8.7*  MG 2.0  --  2.0   Liver Function Tests: Recent Labs    06/03/21 2311  AST 40  ALT 38  ALKPHOS 134*  BILITOT 1.0  PROT 6.8  ALBUMIN 3.3*   Recent Labs    06/03/21 2311  LIPASE 39   CBC: Recent Labs    06/03/21 2311  WBC 7.7  HGB 10.8*  HCT 31.9*  MCV 86.4  PLT 148*   Cardiac Enzymes: No results for input(s): CKTOTAL, CKMB, CKMBINDEX, TROPONINI in the last 72 hours. BNP: Invalid input(s): POCBNP D-Dimer: No results for input(s): DDIMER in the last 72 hours. Hemoglobin A1C: Recent Labs    06/04/21 1659  HGBA1C 5.6   Fasting Lipid Panel: Recent Labs     06/05/21 0013  CHOL 101  HDL 37*  LDLCALC 59  TRIG 26  CHOLHDL 2.7   Thyroid Function Tests: No results for input(s): TSH, T4TOTAL, T3FREE, THYROIDAB in the last 72 hours.  Invalid input(s): FREET3 Anemia Panel: No results for input(s): VITAMINB12, FOLATE, FERRITIN, TIBC, IRON, RETICCTPCT in the last 72 hours.  No results found.   Echo   TELEMETRY: Sinus rhythm 65 bpm:  ASSESSMENT AND PLAN:  Principal Problem:   Acute on chronic combined systolic and diastolic CHF (congestive heart failure) (HCC) Active Problems:   Coronary artery disease   Hypertensive urgency   Acute renal failure superimposed on stage 3b chronic kidney disease (HCC)   Normochromic normocytic anemia   Essential hypertension   Hyponatremia   Abdominal pain   Constipation   Stroke (HCC)   HLD (hyperlipidemia)   Hyperkalemia   AAA (abdominal aortic aneurysm) (HCC)   Elevated troponin   Left hip pain   Sinus bradycardia    1.  Acute on chronic systolic congestive heart failure, BNP 3266, chest x-ray revealing mild interstitial edema, recent 2D echocardiogram 04/10/2021 revealed LVEF 25 to 30% 2.  Sinus bradycardia, hemodynamically stable,  on metoprolol succinate 50 mg daily 3.  Coronary artery disease, status post CABG x3, no chest pain, trivial elevation high-sensitivity troponin without delta (22, 22, 20) 4.  Infrarenal abdominal aortic aneurysm, 4.4 cm, seen by vascular surgery, recommends monitoring for now 5.  Chronic kidney disease, BUN/creatinine 64 and 1.64, respectively, stable after diuresis 6.  Essential hypertension, stopped blood pressure mildly elevated, on metoprolol succinate and amlodipine   Recommendations   1.  Agree with overall current therapy 2.  Continue diuresis 3.  Carefully monitor renal status 4.  Continue metoprolol succinate 25 mg daily 5.  Follow-up Congestive Heart Failure clinic 06/13/2021 6.  Follow-up Dr. Ubaldo Glassing 1 to 2 weeks       Daniel Cowman, MD, PhD,  [facility] 06/06/2021 9:13 AM

## 2021-06-06 NOTE — TOC Progression Note (Addendum)
Transition of Care [facility]) - Progression Note    Patient Details  Name: Daniel Bolton MRN: [id number] Date of Birth: Jun 18, 1934  Transition of [facility]) CM/SW [place], Kevin Phone Number: 06/06/2021, 2:27 PM  Clinical Narrative:     Patient is set up with [facility] home health for PT, RN, and aide. Patient has rolling walker at home and PCP of Dr. [name].  Cory with Alvis Lemmings updated on dc today.   Expected Discharge Plan: Home/Self Care Barriers to Discharge: Continued Medical Work up  Expected Discharge Plan and Services Expected Discharge Plan: Home/Self Care       Living arrangements for the past 2 months: Single Family Home                                       Social Determinants of Health (SDOH) Interventions    Readmission Risk Interventions No flowsheet data found.

## 2021-06-06 NOTE — Progress Notes (Addendum)
Gave patient SMOG enema.  Patient tolerated well.  Post enema, patient had a moderate size bowel movement.  Most was soft, however, smaller harder size balls were present.

## 2021-06-06 NOTE — Progress Notes (Signed)
Occupational Therapy Treatment Patient Details Name: Daniel Bolton MRN: [id number] DOB: 06-Feb-1934 Today's Date: 06/06/2021    History of present illness 87 y.o. male who presents to ER with shortness of breath and constipation. Dx with acute on chronic congestive heart failure. Medical history significant of CHF with EF 25-30%, hypertension, hyperlipidemia, stroke, GERD, CKD-3B, CAD, CABG, anemia, hyponatremia, AAA.   OT comments  Pt seen for OT treatment on this date. Upon arrival to room, pt seated upright at EOB. [employer] Interpreter utilized t/o conversation Public house manager ID # [id number]). Pt agreeable to OT tx and verbalized desire to walk in hallway this date. Due to decreased activity tolerance and balance, pt required SUPERVISION to perform toilet transfer and SUPERVISION to perform functional mobility of household distances (371f) with RW. Pt educated in energy conservation strategies including activity pacing, home/routines modifications, work simplification, AE/DME, prioritizing of meaningful occupations, and falls prevention; handout provided and pt verbalized understanding. Pt is making good progress toward goal and continues to benefit from skilled OT services to maximize return to PLOF and minimize risk of future falls, injury, caregiver burden, and readmission. Will continue to follow POC. Discharge recommendation remains appropriate.     Follow Up Recommendations  No OT follow up;Supervision - Intermittent    Equipment Recommendations  Tub/shower bench       Precautions / Restrictions Precautions Precautions: Fall Restrictions Weight Bearing Restrictions: No       Mobility Bed Mobility Overal bed mobility: Modified Independent                  Transfers Overall transfer level: Needs assistance Equipment used: Rolling walker (2 wheeled) Transfers: Sit to/from Stand Sit to Stand: Supervision              Balance Overall balance assessment: Needs  assistance;Mild deficits observed, not formally tested Sitting-balance support: No upper extremity supported;Feet supported Sitting balance-Leahy Scale: Good     Standing balance support: Bilateral upper extremity supported;During functional activity Standing balance-Leahy Scale: Good Standing balance comment: Good standing balance with UE support from RW during functional mobility                           ADL either performed or assessed with clinical judgement   ADL Overall ADL's : Needs assistance/impaired                         Toilet Transfer: Supervision/safety;Regular Toilet;RW           Functional mobility during ADLs: Min guard;Rolling walker (2 laps around RN station)        Cognition Arousal/Alertness: Awake/alert Behavior During Therapy: WFL for tasks assessed/performed Overall Cognitive Status: Within Functional Limits for tasks assessed                                          Exercises Other Exercises Other Exercises: Education on energy conservation strategies. [place]out [place] provided      General Comments HR 60s-80s during functional mobility    Pertinent Vitals/ Pain       Pain Assessment: No/denies pain         Frequency  Min 1X/week        Progress Toward Goals  OT Goals(current goals can now be found in the care plan section)  Progress towards OT goals: Progressing toward  goals  Acute Rehab OT Goals Patient Stated Goal: to get strong and return to being independent OT Goal Formulation: With patient Time For Goal Achievement: 06/19/21 Potential to Achieve Goals: Good  Plan Discharge plan remains appropriate;Frequency remains appropriate       AM-PAC OT "6 Clicks" Daily Activity     Outcome Measure   Help from another person eating meals?: None Help from another person taking care of personal grooming?: None Help from another person toileting, which includes using toliet, bedpan, or  urinal?: A Little Help from another person bathing (including washing, rinsing, drying)?: A Little Help from another person to put on and taking off regular upper body clothing?: None Help from another person to put on and taking off regular lower body clothing?: A Little 6 Click Score: 21    End of Session Equipment Utilized During Treatment: Gait belt;Rolling walker  OT Visit Diagnosis: Unsteadiness on feet (R26.81);History of falling (Z91.81)   Activity Tolerance Patient tolerated treatment well   Patient Left in chair;with call bell/phone within reach   Nurse Communication Mobility status        Time: 1110-1134 OT Time Calculation (min): 24 min  Charges: OT General Charges $OT Visit: 1 Visit OT Treatments $Self Care/Home Management : 8-22 mins $Therapeutic Activity: 8-22 mins  Fredirick Maudlin, OTR/L [place]

## 2021-06-06 NOTE — Progress Notes (Signed)
Discharged patient using the electronic interpreter.  Discussed follow up appointments, changes in medication, and monitoring weight, blood pressures and sign and symptoms of low blood pressure.  Encouraged patient to keep all follow up appointments and to check his his BP if he exhibits and S&S of low BP.

## 2021-06-06 NOTE — Progress Notes (Signed)
Patient reported missing money from his wallet.  Nurse was notified at the time of discovery.  RN nurse immediately notified the Unit director.  Security was also called to confirm money was not taken down to them.

## 2021-06-13 ENCOUNTER — Ambulatory Visit (HOSPITAL_BASED_OUTPATIENT_CLINIC_OR_DEPARTMENT_OTHER): Payer: Medicare HMO | Admitting: Family

## 2021-06-13 ENCOUNTER — Encounter: Payer: Self-pay | Admitting: Family

## 2021-06-13 ENCOUNTER — Other Ambulatory Visit: Payer: Self-pay

## 2021-06-13 VITALS — BP 143/67 | HR 49 | Resp 18 | Ht 61.0 in | Wt 129.2 lb

## 2021-06-13 DIAGNOSIS — K264 Chronic or unspecified duodenal ulcer with hemorrhage: Secondary | ICD-10-CM | POA: Diagnosis not present

## 2021-06-13 DIAGNOSIS — E875 Hyperkalemia: Secondary | ICD-10-CM | POA: Diagnosis not present

## 2021-06-13 DIAGNOSIS — Z7902 Long term (current) use of antithrombotics/antiplatelets: Secondary | ICD-10-CM | POA: Insufficient documentation

## 2021-06-13 DIAGNOSIS — Z8249 Family history of ischemic heart disease and other diseases of the circulatory system: Secondary | ICD-10-CM | POA: Insufficient documentation

## 2021-06-13 DIAGNOSIS — I130 Hypertensive heart and chronic kidney disease with heart failure and stage 1 through stage 4 chronic kidney disease, or unspecified chronic kidney disease: Secondary | ICD-10-CM | POA: Insufficient documentation

## 2021-06-13 DIAGNOSIS — Z7982 Long term (current) use of aspirin: Secondary | ICD-10-CM | POA: Insufficient documentation

## 2021-06-13 DIAGNOSIS — E871 Hypo-osmolality and hyponatremia: Secondary | ICD-10-CM | POA: Insufficient documentation

## 2021-06-13 DIAGNOSIS — I5032 Chronic diastolic (congestive) heart failure: Secondary | ICD-10-CM

## 2021-06-13 DIAGNOSIS — I10 Essential (primary) hypertension: Secondary | ICD-10-CM

## 2021-06-13 DIAGNOSIS — N189 Chronic kidney disease, unspecified: Secondary | ICD-10-CM | POA: Insufficient documentation

## 2021-06-13 DIAGNOSIS — Z79899 Other long term (current) drug therapy: Secondary | ICD-10-CM | POA: Insufficient documentation

## 2021-06-13 NOTE — Progress Notes (Signed)
Patient ID: Rydell Wender, male    DOB: [birth year], 87 y.o.   MRN: [id number]  [facility] interpreter present during entire visit  HPI  Mr Winford Wacha is a 87 y/o male with a history of HTN, CKD, stroke, hyponatremia and chronic heart failure.    Echo report from 04/10/21 reviewed and showed an EF of 25-30% along with mild LVH, mild/moderate MR/ TR and mild AR. Echo report from 01/14/21 reviewed and showed an EF of 50% along with mild LVH.   Admitted 06/04/21 due to acute on chronic HF and HTN. Bradycardic in the 40's. Cardiology and vascular consults obtained. Initially given IV lasix and transitioned to oral diuretics. Toprol dose decreased. AAA had increased with plan for outpatient f/u. Discharged after 2 days. Was in the ED 05/16/21 due to a fall where he was evaluated and released. Admitted 04/10/21 due to shortness of breath and pedal edema. Initially given IV lasix with transition to oral diuretics. Cardiology consult obtained. Diuretics held due to hyponatremia. Discharged after 4 days. Admitted 03/21/21 due to acute on chronic HF. Initially given IV lasix with transition to oral diuretics. Cardiology consult obtained. Electrolytes corrected. Discharged after 4 days. Admitted 03/10/21 due to acute on chronic HF along with HTN emergency. Initially given IV lasix then it was held due to creatinine bump. Resumed home oral diuretics. Discharged after 4 days.   He presents today for a follow-up visit with a chief complaint of minimal shortness of breath upon moderate exertion. He describes this as chronic in nature having been present for several years. He has associated fatigue, decreased appetite and dizziness along with this. He denies any difficulty sleeping, abdominal distention, palpitations, pedal edema, chest pain, cough or weight gain.   Past Medical History:  Diagnosis Date   CHF (congestive heart failure) ([place])    Chronic kidney disease    Hypertension    Hyponatremia    Stroke [facility])     Past Surgical History:  Procedure Laterality Date   BRAIN SURGERY     CARDIAC SURGERY     Family History  Problem Relation Age of Onset   Cancer Sister    Heart disease Sister    Social History   Tobacco Use   Smoking status: Never   Smokeless tobacco: Never  Substance Use Topics   Alcohol use: No   Allergies  Allergen Reactions   Hydralazine     Pt has severe dizziness with it, and does not want to take it.   Prior to Admission medications   Medication Sig Start Date End Date Taking? Authorizing Provider  amLODipine (NORVASC) 10 MG tablet Take 1 tablet (10 mg total) by mouth daily. 06/07/21  Yes Jennye Boroughs, MD  aspirin EC 81 MG EC tablet Take 1 tablet (81 mg total) by mouth daily. Swallow whole. 03/14/21  Yes Danford, Suann Larry, MD  clopidogrel (PLAVIX) 75 MG tablet Take 75 mg by mouth daily.   Yes [provider]  feeding supplement (ENSURE ENLIVE / ENSURE PLUS) LIQD Take 237 mLs by mouth 2 (two) times daily between meals. 04/14/21  Yes Enzo Bi, MD  furosemide (LASIX) 40 MG tablet Take 1 tablet (40 mg total) by mouth daily. 04/14/21 07/13/21 Yes Enzo Bi, MD  metoprolol succinate (TOPROL-XL) 50 MG 24 hr tablet Take 0.5 tablets (25 mg total) by mouth daily. Take with or immediately following a meal. 06/06/21  Yes Jennye Boroughs, MD  olmesartan (BENICAR) 20 MG tablet Take 20 mg by mouth daily.  Yes [provider]  simvastatin (ZOCOR) 80 MG tablet Take 80 mg by mouth daily.   Yes [provider]   Review of Systems  Constitutional:  Positive for appetite change (decreased) and fatigue.  Eyes: Negative.   Respiratory:  Positive for shortness of breath. Negative for cough and chest tightness.   Cardiovascular:  Negative for chest pain, palpitations and leg swelling.  Gastrointestinal:  Negative for abdominal distention and abdominal pain.  Endocrine: Negative.   Genitourinary: Negative.   Musculoskeletal:  Negative for back pain and neck pain.   Skin: Negative.   Allergic/Immunologic: Negative.   Neurological:  Positive for dizziness (at times). Negative for light-headedness.  Hematological:  Negative for adenopathy. Does not bruise/bleed easily.  Psychiatric/Behavioral:  Negative for dysphoric mood and sleep disturbance (sleeping on 2 pillows). The patient is not nervous/anxious.    Vitals:   06/13/21 0927  BP: (!) 143/67  Pulse: (!) 49  Resp: 18  SpO2: 100%  Weight: 129 lb 4 oz (58.6 kg)  Height: '5\' 1"'$  (1.549 m)   Wt Readings from Last 3 Encounters:  06/13/21 129 lb 4 oz (58.6 kg)  06/06/21 122 lb 9.6 oz (55.6 kg)  05/06/21 134 lb 6 oz (61 kg)   Lab Results  Component Value Date   CREATININE 1.64 (H) 06/06/2021   CREATININE 1.75 (H) 06/05/2021   CREATININE 2.11 (H) 06/05/2021    Physical Exam Vitals reviewed. Exam conducted with a chaperone present (granddaughter).  Constitutional:      Appearance: Normal appearance.  HENT:     Head: Normocephalic and atraumatic.  Cardiovascular:     Rate and Rhythm: Regular rhythm. Bradycardia present.  Pulmonary:     Effort: Pulmonary effort is normal. No respiratory distress.     Breath sounds: No wheezing or rales.  Abdominal:     General: There is no distension.     Palpations: Abdomen is soft.  Musculoskeletal:        General: No tenderness.     Cervical back: Normal range of motion and neck supple.     Right lower leg: Edema (trace) present.     Left lower leg: Edema (trace) present.  Skin:    General: Skin is warm and dry.  Neurological:     General: No focal deficit present.     Mental Status: He is alert and oriented to person, place, and time.  Psychiatric:        Mood and Affect: Mood normal.        Behavior: Behavior normal.        Thought Content: Thought content normal.    Assessment & Plan:  1: Chronic heart failure with preserved ejection fraction with structural changes (mild LVH)- - NYHA class II - euvolemic today - weighing daily; reminded  to weigh every morning and call for an overnight weight gain of > 2 pounds or a weekly weight gain of > 5 pounds - weight down 5 pounds from last visit here 5 weeks ago - not adding salt  - saw cardiology (Fath) 06/11/21; returns Nov  - saw pulmonology Lanney Gins) 04/04/21; returns next month - BNP 06/04/21 was 3266.0  2: HTN with CKD- - BP minimally elevated today; says that it's running lower than this at home - saw PCP Lovie Macadamia) 05/14/21 - BMP 06/06/21 reviewed and showed sodium 132, potassium 3.7, creatinine 1.64 and GFR 41 - saw nephrology (Korrapati) 05/29/21  3: Hyponatremia- - appears to be chronic   Medication bottles reviewed.  Return in 3 months or sooner for any questions/problems before then.

## 2021-06-13 NOTE — Patient Instructions (Addendum)
Continue weighing daily and call for an overnight weight gain of > 2 pounds or a weekly weight gain of >5 pounds.     Call [facility] to schedule post hospital followup at # [phone]

## 2021-06-14 ENCOUNTER — Inpatient Hospital Stay
Admission: EM | Admit: 2021-06-14 | Discharge: 2021-06-18 | DRG: 377 | Disposition: A | Discharge status: Home Health | Payer: Medicare HMO | Attending: Internal Medicine | Admitting: Internal Medicine

## 2021-06-14 ENCOUNTER — Emergency Department: Payer: Medicare HMO

## 2021-06-14 ENCOUNTER — Other Ambulatory Visit: Payer: Self-pay

## 2021-06-14 ENCOUNTER — Inpatient Hospital Stay: Payer: Medicare HMO

## 2021-06-14 DIAGNOSIS — E872 Acidosis: Secondary | ICD-10-CM | POA: Diagnosis present

## 2021-06-14 DIAGNOSIS — K264 Chronic or unspecified duodenal ulcer with hemorrhage: Principal | ICD-10-CM | POA: Diagnosis present

## 2021-06-14 DIAGNOSIS — N2581 Secondary hyperparathyroidism of renal origin: Secondary | ICD-10-CM | POA: Diagnosis present

## 2021-06-14 DIAGNOSIS — D6959 Other secondary thrombocytopenia: Secondary | ICD-10-CM | POA: Diagnosis present

## 2021-06-14 DIAGNOSIS — N1832 Chronic kidney disease, stage 3b: Secondary | ICD-10-CM | POA: Diagnosis present

## 2021-06-14 DIAGNOSIS — K5900 Constipation, unspecified: Secondary | ICD-10-CM | POA: Diagnosis present

## 2021-06-14 DIAGNOSIS — I130 Hypertensive heart and chronic kidney disease with heart failure and stage 1 through stage 4 chronic kidney disease, or unspecified chronic kidney disease: Secondary | ICD-10-CM | POA: Diagnosis present

## 2021-06-14 DIAGNOSIS — Z955 Presence of coronary angioplasty implant and graft: Secondary | ICD-10-CM | POA: Diagnosis not present

## 2021-06-14 DIAGNOSIS — I2510 Atherosclerotic heart disease of native coronary artery without angina pectoris: Secondary | ICD-10-CM | POA: Diagnosis present

## 2021-06-14 DIAGNOSIS — R578 Other shock: Secondary | ICD-10-CM | POA: Diagnosis present

## 2021-06-14 DIAGNOSIS — E785 Hyperlipidemia, unspecified: Secondary | ICD-10-CM | POA: Diagnosis present

## 2021-06-14 DIAGNOSIS — Z7982 Long term (current) use of aspirin: Secondary | ICD-10-CM | POA: Diagnosis not present

## 2021-06-14 DIAGNOSIS — K259 Gastric ulcer, unspecified as acute or chronic, without hemorrhage or perforation: Secondary | ICD-10-CM | POA: Diagnosis present

## 2021-06-14 DIAGNOSIS — Z20822 Contact with and (suspected) exposure to covid-19: Secondary | ICD-10-CM | POA: Diagnosis present

## 2021-06-14 DIAGNOSIS — K319 Disease of stomach and duodenum, unspecified: Secondary | ICD-10-CM | POA: Diagnosis present

## 2021-06-14 DIAGNOSIS — D62 Acute posthemorrhagic anemia: Secondary | ICD-10-CM | POA: Diagnosis present

## 2021-06-14 DIAGNOSIS — G9341 Metabolic encephalopathy: Secondary | ICD-10-CM | POA: Diagnosis present

## 2021-06-14 DIAGNOSIS — E875 Hyperkalemia: Secondary | ICD-10-CM | POA: Diagnosis present

## 2021-06-14 DIAGNOSIS — R410 Disorientation, unspecified: Secondary | ICD-10-CM

## 2021-06-14 DIAGNOSIS — N170 Acute kidney failure with tubular necrosis: Secondary | ICD-10-CM | POA: Diagnosis present

## 2021-06-14 DIAGNOSIS — Z79899 Other long term (current) drug therapy: Secondary | ICD-10-CM

## 2021-06-14 DIAGNOSIS — K2100 Gastro-esophageal reflux disease with esophagitis, without bleeding: Secondary | ICD-10-CM | POA: Diagnosis present

## 2021-06-14 DIAGNOSIS — K922 Gastrointestinal hemorrhage, unspecified: Secondary | ICD-10-CM | POA: Diagnosis not present

## 2021-06-14 DIAGNOSIS — Z7902 Long term (current) use of antithrombotics/antiplatelets: Secondary | ICD-10-CM

## 2021-06-14 DIAGNOSIS — D631 Anemia in chronic kidney disease: Secondary | ICD-10-CM | POA: Diagnosis present

## 2021-06-14 DIAGNOSIS — Z8673 Personal history of transient ischemic attack (TIA), and cerebral infarction without residual deficits: Secondary | ICD-10-CM

## 2021-06-14 DIAGNOSIS — Z951 Presence of aortocoronary bypass graft: Secondary | ICD-10-CM

## 2021-06-14 DIAGNOSIS — I5042 Chronic combined systolic (congestive) and diastolic (congestive) heart failure: Secondary | ICD-10-CM | POA: Diagnosis present

## 2021-06-14 DIAGNOSIS — Z809 Family history of malignant neoplasm, unspecified: Secondary | ICD-10-CM | POA: Diagnosis not present

## 2021-06-14 DIAGNOSIS — N400 Enlarged prostate without lower urinary tract symptoms: Secondary | ICD-10-CM | POA: Diagnosis present

## 2021-06-14 DIAGNOSIS — N179 Acute kidney failure, unspecified: Secondary | ICD-10-CM | POA: Diagnosis not present

## 2021-06-14 DIAGNOSIS — I440 Atrioventricular block, first degree: Secondary | ICD-10-CM | POA: Diagnosis present

## 2021-06-14 DIAGNOSIS — E861 Hypovolemia: Secondary | ICD-10-CM | POA: Diagnosis present

## 2021-06-14 DIAGNOSIS — I714 Abdominal aortic aneurysm, without rupture, unspecified: Secondary | ICD-10-CM

## 2021-06-14 DIAGNOSIS — K921 Melena: Secondary | ICD-10-CM

## 2021-06-14 DIAGNOSIS — J9601 Acute respiratory failure with hypoxia: Secondary | ICD-10-CM | POA: Diagnosis present

## 2021-06-14 DIAGNOSIS — E871 Hypo-osmolality and hyponatremia: Secondary | ICD-10-CM | POA: Diagnosis present

## 2021-06-14 LAB — BLOOD GAS, VENOUS
Acid-base deficit: 7.7 mmol/L — ABNORMAL HIGH (ref 0.0–2.0)
Bicarbonate: 18.9 mmol/L — ABNORMAL LOW (ref 20.0–28.0)
O2 Saturation: 53.9 %
Patient temperature: 37
pCO2, Ven: 43 mmHg — ABNORMAL LOW (ref 44.0–60.0)
pH, Ven: 7.25 (ref 7.250–7.430)
pO2, Ven: 34 mmHg (ref 32.0–45.0)

## 2021-06-14 LAB — URINALYSIS, COMPLETE (UACMP) WITH MICROSCOPIC
Bacteria, UA: NONE SEEN
Bilirubin Urine: NEGATIVE
Glucose, UA: NEGATIVE mg/dL
Hgb urine dipstick: NEGATIVE
Ketones, ur: NEGATIVE mg/dL
Leukocytes,Ua: NEGATIVE
Nitrite: NEGATIVE
Protein, ur: NEGATIVE mg/dL
Specific Gravity, Urine: <1.005 — ABNORMAL LOW (ref 1.005–1.030)
pH: 5.5 (ref 5.0–8.0)

## 2021-06-14 LAB — CBC
HCT: 20.9 % — ABNORMAL LOW (ref 39.0–52.0)
Hemoglobin: 7.1 g/dL — ABNORMAL LOW (ref 13.0–17.0)
MCH: 30 pg (ref 26.0–34.0)
MCHC: 34 g/dL (ref 30.0–36.0)
MCV: 88.2 fL (ref 80.0–100.0)
Platelets: 132 10*3/uL — ABNORMAL LOW (ref 150–400)
RBC: 2.37 MIL/uL — ABNORMAL LOW (ref 4.22–5.81)
RDW: 16 % — ABNORMAL HIGH (ref 11.5–15.5)
WBC: 7.6 10*3/uL (ref 4.0–10.5)
nRBC: 0 % (ref 0.0–0.2)

## 2021-06-14 LAB — GLUCOSE, CAPILLARY
Glucose-Capillary: 73 mg/dL (ref 70–99)
Glucose-Capillary: 155 mg/dL — ABNORMAL HIGH (ref 70–99)
Glucose-Capillary: 84 mg/dL (ref 70–99)
Glucose-Capillary: 79 mg/dL (ref 70–99)

## 2021-06-14 LAB — CBC WITH DIFFERENTIAL/PLATELET
Abs Immature Granulocytes: 0.04 10*3/uL (ref 0.00–0.07)
Basophils Absolute: 0 10*3/uL (ref 0.0–0.1)
Basophils Relative: 0 %
Eosinophils Absolute: 0 10*3/uL (ref 0.0–0.5)
Eosinophils Relative: 0 %
HCT: 19.8 % — ABNORMAL LOW (ref 39.0–52.0)
Hemoglobin: 6.9 g/dL — ABNORMAL LOW (ref 13.0–17.0)
Immature Granulocytes: 1 %
Lymphocytes Relative: 5 %
Lymphs Abs: 0.4 10*3/uL — ABNORMAL LOW (ref 0.7–4.0)
MCH: 30.3 pg (ref 26.0–34.0)
MCHC: 34.8 g/dL (ref 30.0–36.0)
MCV: 86.8 fL (ref 80.0–100.0)
Monocytes Absolute: 0.5 10*3/uL (ref 0.1–1.0)
Monocytes Relative: 5 %
Neutro Abs: 7.9 10*3/uL — ABNORMAL HIGH (ref 1.7–7.7)
Neutrophils Relative %: 89 %
Platelets: 110 10*3/uL — ABNORMAL LOW (ref 150–400)
RBC: 2.28 MIL/uL — ABNORMAL LOW (ref 4.22–5.81)
RDW: 15.2 % (ref 11.5–15.5)
WBC: 8.8 10*3/uL (ref 4.0–10.5)
nRBC: 0 % (ref 0.0–0.2)

## 2021-06-14 LAB — HEPATIC FUNCTION PANEL
ALT: 42 U/L (ref 0–44)
AST: 36 U/L (ref 15–41)
Albumin: 2.4 g/dL — ABNORMAL LOW (ref 3.5–5.0)
Alkaline Phosphatase: 95 U/L (ref 38–126)
Bilirubin, Direct: <0.1 mg/dL (ref 0.0–0.2)
Total Bilirubin: 0.7 mg/dL (ref 0.3–1.2)
Total Protein: 4.8 g/dL — ABNORMAL LOW (ref 6.5–8.1)

## 2021-06-14 LAB — BASIC METABOLIC PANEL
Anion gap: 10 (ref 5–15)
Anion gap: 8 (ref 5–15)
BUN: 108 mg/dL — ABNORMAL HIGH (ref 8–23)
BUN: 105 mg/dL — ABNORMAL HIGH (ref 8–23)
CO2: 17 mmol/L — ABNORMAL LOW (ref 22–32)
CO2: 18 mmol/L — ABNORMAL LOW (ref 22–32)
Calcium: 8 mg/dL — ABNORMAL LOW (ref 8.9–10.3)
Calcium: 7.6 mg/dL — ABNORMAL LOW (ref 8.9–10.3)
Chloride: 93 mmol/L — ABNORMAL LOW (ref 98–111)
Chloride: 99 mmol/L (ref 98–111)
Creatinine, Ser: 2.15 mg/dL — ABNORMAL HIGH (ref 0.61–1.24)
Creatinine, Ser: 2.23 mg/dL — ABNORMAL HIGH (ref 0.61–1.24)
GFR, Estimated: 29 mL/min — ABNORMAL LOW (ref >60)
GFR, Estimated: 28 mL/min — ABNORMAL LOW (ref >60)
Glucose, Bld: 86 mg/dL (ref 70–99)
Glucose, Bld: 73 mg/dL (ref 70–99)
Potassium: 6.9 mmol/L (ref 3.5–5.1)
Potassium: 5.2 mmol/L — ABNORMAL HIGH (ref 3.5–5.1)
Sodium: 120 mmol/L — ABNORMAL LOW (ref 135–145)
Sodium: 125 mmol/L — ABNORMAL LOW (ref 135–145)

## 2021-06-14 LAB — RESP PANEL BY RT-PCR (FLU A&B, COVID) ARPGX2
Influenza A by PCR: NEGATIVE
Influenza B by PCR: NEGATIVE
SARS Coronavirus 2 by RT PCR: NEGATIVE

## 2021-06-14 LAB — OSMOLALITY: Osmolality: 300 mOsm/kg — ABNORMAL HIGH (ref 275–295)

## 2021-06-14 LAB — MRSA NEXT GEN BY PCR, NASAL: MRSA by PCR Next Gen: NOT DETECTED

## 2021-06-14 LAB — MAGNESIUM: Magnesium: 2.2 mg/dL (ref 1.7–2.4)

## 2021-06-14 LAB — OSMOLALITY, URINE: Osmolality, Ur: 314 mOsm/kg (ref 300–900)

## 2021-06-14 LAB — BRAIN NATRIURETIC PEPTIDE: B Natriuretic Peptide: 647.4 pg/mL — ABNORMAL HIGH (ref 0.0–100.0)

## 2021-06-14 LAB — PHOSPHORUS: Phosphorus: 4.3 mg/dL (ref 2.5–4.6)

## 2021-06-14 LAB — ABO/RH: ABO/RH(D): O POS

## 2021-06-14 LAB — SODIUM, URINE, RANDOM: Sodium, Ur: 56 mmol/L

## 2021-06-14 LAB — PREPARE RBC (CROSSMATCH)

## 2021-06-14 LAB — PROTIME-INR
INR: 1.5 — ABNORMAL HIGH (ref 0.8–1.2)
Prothrombin Time: 18.2 seconds — ABNORMAL HIGH (ref 11.4–15.2)

## 2021-06-14 LAB — ETHANOL: Alcohol, Ethyl (B): <10 mg/dL (ref <10)

## 2021-06-14 LAB — CBG MONITORING, ED: Glucose-Capillary: 89 mg/dL (ref 70–99)

## 2021-06-14 LAB — TROPONIN I (HIGH SENSITIVITY): Troponin I (High Sensitivity): 20 ng/L — ABNORMAL HIGH (ref <18)

## 2021-06-14 MED ORDER — INSULIN ASPART 100 UNIT/ML IV SOLN
10.0000 [IU] | Freq: Once | INTRAVENOUS | Status: AC
  Administered 2021-06-14: 10 [IU] via INTRAVENOUS
  Filled 2021-06-14: qty 0.1

## 2021-06-14 MED ORDER — SODIUM CHLORIDE 0.9 % IV BOLUS
1000.0000 mL | Freq: Once | INTRAVENOUS | Status: AC
  Administered 2021-06-14: 1000 mL via INTRAVENOUS

## 2021-06-14 MED ORDER — SODIUM CHLORIDE 0.9 % IV SOLN
10.0000 mL/h | Freq: Once | INTRAVENOUS | Status: DC
Start: 2021-06-14 — End: 2021-06-18

## 2021-06-14 MED ORDER — IOHEXOL 350 MG/ML SOLN
1.0000 mL | Freq: Once | INTRAVENOUS | Status: AC | PRN
  Administered 2021-06-14: 1 mL via INTRAVENOUS

## 2021-06-14 MED ORDER — NOREPINEPHRINE 4 MG/250ML-% IV SOLN
2.0000 ug/min | INTRAVENOUS | Status: DC
  Administered 2021-06-14: 2 ug/min via INTRAVENOUS
  Filled 2021-06-14: qty 250

## 2021-06-14 MED ORDER — SODIUM ZIRCONIUM CYCLOSILICATE 10 G PO PACK
10.0000 g | PACK | Freq: Once | ORAL | Status: DC
  Filled 2021-06-14 (×2): qty 1

## 2021-06-14 MED ORDER — FUROSEMIDE 10 MG/ML IJ SOLN
40.0000 mg | Freq: Once | INTRAMUSCULAR | Status: AC
  Administered 2021-06-14: 40 mg via INTRAVENOUS
  Filled 2021-06-14: qty 4

## 2021-06-14 MED ORDER — PANTOPRAZOLE INFUSION (NEW) - SIMPLE MED
8.0000 mg/h | INTRAVENOUS | Status: DC
  Administered 2021-06-14 (×2): 8 mg/h via INTRAVENOUS
  Filled 2021-06-14: qty 80
  Filled 2021-06-14: qty 100
  Filled 2021-06-14: qty 80

## 2021-06-14 MED ORDER — PANTOPRAZOLE 80MG IVPB - SIMPLE MED
80.0000 mg | Freq: Once | INTRAVENOUS | Status: AC
  Administered 2021-06-14: 80 mg via INTRAVENOUS
  Filled 2021-06-14: qty 80

## 2021-06-14 MED ORDER — ACETAMINOPHEN 325 MG PO TABS
650.0000 mg | ORAL_TABLET | Freq: Four times a day (QID) | ORAL | Status: DC | PRN
  Administered 2021-06-14 – 2021-06-15 (×2): 650 mg via ORAL
  Filled 2021-06-14 (×3): qty 2

## 2021-06-14 MED ORDER — CALCIUM GLUCONATE-NACL 1-0.675 GM/50ML-% IV SOLN
1.0000 g | Freq: Once | INTRAVENOUS | Status: AC
  Administered 2021-06-14: 1000 mg via INTRAVENOUS
  Filled 2021-06-14: qty 50

## 2021-06-14 MED ORDER — SODIUM CHLORIDE 0.9 % IV SOLN: INTRAVENOUS | Status: DC

## 2021-06-14 MED ORDER — PATIROMER SORBITEX CALCIUM 8.4 G PO PACK
25.2000 g | PACK | ORAL | Status: AC
  Administered 2021-06-14: 25.2 g via ORAL
  Filled 2021-06-14: qty 3

## 2021-06-14 MED ORDER — DEXTROSE 50 % IV SOLN
1.0000 | Freq: Once | INTRAVENOUS | Status: AC
  Administered 2021-06-14: 50 mL via INTRAVENOUS
  Filled 2021-06-14: qty 50

## 2021-06-14 MED ORDER — PANTOPRAZOLE SODIUM 40 MG IV SOLR: 40.0000 mg | Freq: Two times a day (BID) | INTRAVENOUS | Status: DC

## 2021-06-14 MED ORDER — SODIUM BICARBONATE 8.4 % IV SOLN
100.0000 meq | Freq: Once | INTRAVENOUS | Status: AC
  Administered 2021-06-14: 100 meq via INTRAVENOUS
  Filled 2021-06-14: qty 50

## 2021-06-14 MED ORDER — ONDANSETRON HCL 4 MG/2ML IJ SOLN
4.0000 mg | Freq: Four times a day (QID) | INTRAMUSCULAR | Status: DC | PRN
  Administered 2021-06-14: 4 mg via INTRAVENOUS
  Filled 2021-06-14: qty 2

## 2021-06-14 MED ORDER — IPRATROPIUM-ALBUTEROL 0.5-2.5 (3) MG/3ML IN SOLN: 3.0000 mL | Freq: Four times a day (QID) | RESPIRATORY_TRACT | Status: DC | PRN

## 2021-06-14 MED ORDER — SODIUM CHLORIDE 0.9 % IV SOLN: 250.0000 mL | INTRAVENOUS | Status: DC

## 2021-06-14 MED ORDER — SODIUM CHLORIDE 0.9% IV SOLUTION: Freq: Once | INTRAVENOUS | Status: DC

## 2021-06-14 MED ORDER — CHLORHEXIDINE GLUCONATE CLOTH 2 % EX PADS
6.0000 | MEDICATED_PAD | Freq: Every day | CUTANEOUS | Status: DC
  Administered 2021-06-15 – 2021-06-18 (×3): 6 via TOPICAL

## 2021-06-14 MED ORDER — PATIROMER SORBITEX CALCIUM 8.4 G PO PACK: 25.2000 g | PACK | ORAL | Status: DC

## 2021-06-14 MED ORDER — SODIUM BICARBONATE 8.4 % IV SOLN: 50.0000 meq | Freq: Once | INTRAVENOUS | Status: DC

## 2021-06-14 MED ORDER — ONDANSETRON HCL 4 MG/2ML IJ SOLN
4.0000 mg | Freq: Once | INTRAMUSCULAR | Status: DC
Start: 2021-06-14 — End: 2021-06-16

## 2021-06-14 MED ORDER — ALBUTEROL SULFATE (2.5 MG/3ML) 0.083% IN NEBU
10.0000 mg | INHALATION_SOLUTION | Freq: Once | RESPIRATORY_TRACT | Status: AC
  Administered 2021-06-14: 10 mg via RESPIRATORY_TRACT
  Filled 2021-06-14: qty 12

## 2021-06-14 MED ORDER — LACTATED RINGERS IV SOLN: INTRAVENOUS | Status: DC

## 2021-06-14 NOTE — H&P (Signed)
NAME:  Daniel Bolton, MRN:  [id number], DOB:  [birth year], LOS: 0 ADMISSION DATE:  06/14/2021, CONSULTATION DATE: 06/14/2021 REFERRING MD: Dr. Tamala Julian, CHIEF COMPLAINT: Fall and syncope   History of Present Illness:  This is an 87 yo male who presented to [facility] ER on 09/9 for evaluation of a fall and syncope.  Per ER notes the pt reported having a fall early the morning of 09/9 around 04:00 am during which he struck his head, but did not lose consciousness.  He also reported hallucinations overnight "seeing his granddaughter" who was not at his home.  He also has had constipation over the past couple of days, however he had a bloody bowel movement overnight.  It was also reported the pt has fallen a couple of times over the past few days.  He was recently discharged from [facility] on 09/1 following treatment of acute on chronic combined systolic and diastolic CHF, sinus bradycardia, and hypertensive emergency.  He also saw his outpatient Cardiologist on 09/6 with recommendations to continue dual anti-platelet therapy; reduce dose of metoprolol xl from 25 mg daily to 12.5 mg daily due to recent bradycardia; continue furosemide but remain off K+, continue simvastatin; and continue olmesartan.    ED course Upon arrival to the ER pts map 63 and EKG revealed sinus bradycardia with 1st degree heart block without signs of ischemia.  Pt also had a bloody bowel movement in the ER and an episode of hematemesis.  Lab results revealed Na+ 120, K+6.9, CO2 17, BUN 108, creatinine 2.15,  hgb 7.1, BNP 647, and vbg pH 7.25/pCO2 43/acid-base deficit 7.7/bicarb 18.9.  He received 40 mg iv lasix x1 dose, 1 unit of pRBC's, 1g calcium gluconate, 10 units iv lasix, 1 amp of dextrose, 2L NS bolus, and albuterol neb.  PCCM team contacted for ICU admission for additional workup and treatment.   Pertinent  Medical History  Stage 3b Chronic Kidney Disease CAD Essential HTN  Constipation  Stroke  HLD  AAA (abdominal aortic  aneurysm) Chronic combined systolic and diastolic CHF  BPH CABG   Significant Hospital Events: Including procedures, antibiotic start and stop dates in addition to other pertinent events   09/9: Pt admitted to ICU with acute on chronic renal failure with hyperkalemia and metabolic acidosis and hemorrhagic shock secondary to acute GI bleed 09/9: CT Head>> 09/9 US Aorta Duplex Limited>>  Interim History / Subjective:  Pt c/o headache and bilateral lower extremity pain/tremors   Objective   Blood pressure (!) 119/41, pulse 71, temperature (!) 97.5 F (36.4 C), temperature source Oral, resp. rate 18, SpO2 100 %.        Intake/Output Summary (Last 24 hours) at 06/14/2021 1225 Last data filed at 06/14/2021 1036 Gross per 24 hour  Intake 999 ml  Output --  Net 999 ml   There were no vitals filed for this visit.  Examination: General: acutely ill appearing male, resting in bed NAD  HENT: supple, no JVD Lungs: clear throughout, even, non labored  Cardiovascular: sinus tachycardia, no R/G, 2+ radial/1+ distal pulses, 2+ bilateral ankle swelling  Abdomen: +BS x4, soft, non tender, non distended  Extremities: moves all extremities, normal tone  Neuro: alert and oriented, follows commands, PERRLA GU: voiding clear yellow urine   Resolved Hospital Problem list     Assessment & Plan:  Acute respiratory failure secondary to metabolic acidosis  - Supplemental O2 for dyspnea and/or hypoxia  - Prn bronchodilator therapy   Hemorrhagic shock secondary to acute GI  bleed Abdominal aortic aneurysm~CTA 06/04/21 revealed increased aneurysm from 4.1 cm to 4.4 cm since 12/11/2020 exam  Hx: HTN, Chronic combined systolic diastolic CHF, and Stroke  - Continuous telemetry monitoring  - Hold outpatient antihypertensives - Gentle fluid resuscitation or prn levophed gtt to maintain map >65 - US Aorta Duplex Limited to assess abdominal aortic aneurysm   Hyponatremia likely due to hypovolemia  Acute  on chronic renal failure with hyperkalemia and metabolic acidosis: K+ [id number] - Trend BMP and VBG  - Replace electrolytes as indicated  - Monitor UOP - Avoid nephrotoxic medications  - Nephrology consulted appreciate input  Acute on chronic anemia due to acute GI bleed - Trend CBC  - Monitor for s/sx of bleeding  - Transfuse for hgb <7 and/or active bleeding  - Hold outpatient aspirin and plavix  - Continue protonix gtt for now  - Keep NPO for now  - Gastroenterology consulted appreciate input~will likely proceed with upper endoscopy in the am 09/10 once hyperkalemia resolved  Acute metabolic encephalopathy  Falls  - CT Head and Spine pending - Avoid sedating medications when able  - Frequent reorientation  - Maintain sleep/wake cycle   Best Practice (right click and "Reselect all SmartList Selections" daily)   Diet/type: NPO w/ oral meds DVT prophylaxis: SCD GI prophylaxis: PPI Lines: N/A Foley:  No and not indicated  Code Status:  full code Last date of multidisciplinary goals of care discussion [N/A]  Labs   CBC: Recent Labs  Lab 06/14/21 0915  WBC 7.6  HGB 7.1*  HCT 20.9*  MCV 88.2  PLT 132*    Basic Metabolic Panel: Recent Labs  Lab 06/14/21 0915  NA 120*  K 6.9*  CL 93*  CO2 17*  GLUCOSE 86  BUN 108*  CREATININE 2.15*  CALCIUM 8.0*   GFR: Estimated Creatinine Clearance: 18.6 mL/min (A) (by C-G formula based on SCr of 2.15 mg/dL (H)). Recent Labs  Lab 06/14/21 0915  WBC 7.6    Liver Function Tests: No results for input(s): AST, ALT, ALKPHOS, BILITOT, PROT, ALBUMIN in the last 168 hours. No results for input(s): LIPASE, AMYLASE in the last 168 hours. No results for input(s): AMMONIA in the last 168 hours.  ABG    Component Value Date/Time   HCO3 18.9 (L) 06/14/2021 1155   ACIDBASEDEF 7.7 (H) 06/14/2021 1155   O2SAT 53.9 06/14/2021 1155     Coagulation Profile: No results for input(s): INR, PROTIME in the last 168 hours.  Cardiac  Enzymes: No results for input(s): CKTOTAL, CKMB, CKMBINDEX, TROPONINI in the last 168 hours.  HbA1C: Hgb A1c MFr Bld  Date/Time Value Ref Range Status  06/04/2021 04:59 PM 5.6 4.8 - 5.6 % Final    Comment:    (NOTE)         Prediabetes: 5.7 - 6.4         Diabetes: >6.4         Glycemic control for adults with diabetes: <7.0     CBG: Recent Labs  Lab 06/14/21 0911  GLUCAP 89    Review of Systems: Positives in BOLD   Gen: falls, fever, chills, weight change, fatigue, night sweats HEENT: Denies blurred vision, double vision, hearing loss, tinnitus, sinus congestion, rhinorrhea, sore throat, neck stiffness, dysphagia PULM: Denies shortness of breath, cough, sputum production, hemoptysis, wheezing CV: Denies chest pain, edema, orthopnea, paroxysmal nocturnal dyspnea, palpitations GI: Denies abdominal pain, nausea, vomiting, diarrhea, hematochezia, melena, constipation, change in bowel habits GU: Denies dysuria, hematuria, polyuria, oliguria,  urethral discharge Endocrine: Denies hot or cold intolerance, polyuria, polyphagia or appetite change Derm: Denies rash, dry skin, scaling or peeling skin change Heme: Denies easy bruising, bleeding, bleeding gums Neuro: syncope, headache, numbness, weakness, slurred speech, loss of memory or consciousness  Past Medical History:  He,  has a past medical history of CHF (congestive heart failure) ([place]), Chronic kidney disease, Hypertension, Hyponatremia, and Stroke ([place]).   Surgical History:   Past Surgical History:  Procedure Laterality Date   BRAIN SURGERY     CARDIAC SURGERY       Social History:   reports that he has never smoked. He has never used smokeless tobacco. He reports that he does not drink alcohol and does not use drugs.   Family History:  His family history includes Cancer in his sister; Heart disease in his sister.   Allergies Allergies  Allergen Reactions   Hydralazine     Pt has severe dizziness with it, and does  not want to take it.     Home Medications  Prior to Admission medications   Medication Sig Start Date End Date Taking? Authorizing Provider  amLODipine (NORVASC) 10 MG tablet Take 1 tablet (10 mg total) by mouth daily. 06/07/21   Jennye Boroughs, MD  aspirin EC 81 MG EC tablet Take 1 tablet (81 mg total) by mouth daily. Swallow whole. 03/14/21   Danford, Suann Larry, MD  clopidogrel (PLAVIX) 75 MG tablet Take 75 mg by mouth daily.    [provider]  feeding supplement (ENSURE ENLIVE / ENSURE PLUS) LIQD Take 237 mLs by mouth 2 (two) times daily between meals. 04/14/21   Enzo Bi, MD  furosemide (LASIX) 40 MG tablet Take 1 tablet (40 mg total) by mouth daily. 04/14/21 07/13/21  Enzo Bi, MD  metoprolol succinate (TOPROL-XL) 50 MG 24 hr tablet Take 0.5 tablets (25 mg total) by mouth daily. Take with or immediately following a meal. 06/06/21   Jennye Boroughs, MD  olmesartan (BENICAR) 20 MG tablet Take 20 mg by mouth daily.    [provider]  simvastatin (ZOCOR) 80 MG tablet Take 80 mg by mouth daily.    [provider]     Critical care time: 45 minutes     -Utilized Spanish Interpretor Lily to update pt and pts family regarding current plan of care and all questions answered.  Rosilyn Mings, AGNP  Pulmonary/Critical Care Pager [phone] (please enter 7 digits) PCCM Consult Pager [phone] (please enter 7 digits)

## 2021-06-14 NOTE — Plan of Care (Signed)

## 2021-06-14 NOTE — Progress Notes (Signed)
Patient has had 3 bloody bowel movements since shift change. BP soft but stable. NP made aware. Will continue to monitor.

## 2021-06-14 NOTE — Progress Notes (Signed)
[facility]  ROUNDING NOTE   Subjective:  Patient well-known to [place] from the office. We follow him for chronic kidney disease stage IIIb.  Baseline EGFR appears to be 41. Chronic with acute GI bleed.  Follow-up with vomiting of blood and has also had bright red blood per rectum today at home. We are asked to see him for acute kidney injury with a BUN of 108 and creatinine of 2.15. Also has significant hyperkalemia with serum potassium of 6.9.  Objective:  Vital signs in last 24 hours:  Temp:  [97.5 F (36.4 C)-98 F (36.7 C)] 97.5 F (36.4 C) (09/09 1200) Pulse Rate:  [41-75] 71 (09/09 1200) Resp:  [14-21] 18 (09/09 1200) BP: (88-122)/(31-67) 119/41 (09/09 1200) SpO2:  [96 %-100 %] 100 % (09/09 1100)  Weight change:  There were no vitals filed for this visit.  Intake/Output: No intake/output data recorded.   Intake/Output this shift:  Total I/O In: 999 [IV Piggyback:999] Out: -   Physical Exam: General: No acute distress  Head: Normocephalic, atraumatic. Moist oral mucosal membranes  Eyes: Anicteric  Neck: Supple  Lungs:  Clear to auscultation, normal effort  Heart: S1S2 no rubs  Abdomen:  Soft, nontender, bowel sounds present  Extremities: No peripheral edema.  Neurologic: Awake, alert, following commands  Skin: No acute rash  Access: No hemodialysis access    Basic Metabolic Panel: Recent Labs  Lab 06/14/21 0915  NA 120*  K 6.9*  CL 93*  CO2 17*  GLUCOSE 86  BUN 108*  CREATININE 2.15*  CALCIUM 8.0*    Liver Function Tests: Recent Labs  Lab 06/14/21 1155  AST 36  ALT 42  ALKPHOS 95  BILITOT 0.7  PROT 4.8*  ALBUMIN 2.4*   No results for input(s): LIPASE, AMYLASE in the last 168 hours. No results for input(s): AMMONIA in the last 168 hours.  CBC: Recent Labs  Lab 06/14/21 0915  WBC 7.6  HGB 7.1*  HCT 20.9*  MCV 88.2  PLT 132*    Cardiac Enzymes: No results for input(s): CKTOTAL, CKMB, CKMBINDEX, TROPONINI in the last  168 hours.  BNP: Invalid input(s): POCBNP  CBG: Recent Labs  Lab 06/14/21 0911  GLUCAP 92    Microbiology: Results for orders placed or performed during the hospital encounter of 06/14/21  Resp Panel by RT-PCR (Flu A&B, Covid) Nasopharyngeal Swab     Status: None   Collection Time: 06/14/21  9:38 AM   Specimen: Nasopharyngeal Swab; Nasopharyngeal(NP) swabs in vial transport medium  Result Value Ref Range Status   SARS Coronavirus 2 by RT PCR NEGATIVE NEGATIVE Final    Comment: (NOTE) SARS-CoV-2 target nucleic acids are NOT DETECTED.  The SARS-CoV-2 RNA is generally detectable in upper respiratory specimens during the acute phase of infection. The lowest concentration of SARS-CoV-2 viral copies this assay can detect is 138 copies/mL. A negative result does not preclude SARS-Cov-2 infection and should not be used as the sole basis for treatment or other patient management decisions. A negative result may occur with  improper specimen collection/handling, submission of specimen other than nasopharyngeal swab, presence of viral mutation(s) within the areas targeted by this assay, and inadequate number of viral copies(<138 copies/mL). A negative result must be combined with clinical observations, patient history, and epidemiological information. The expected result is Negative.  Fact Sheet for Patients:  EntrepreneurPulse.com.au  Fact Sheet for Healthcare Providers:  IncredibleEmployment.be  This test is no t yet approved or cleared by the [place] and  has been authorized for detection and/or diagnosis of SARS-CoV-2 by FDA under an Emergency Use Authorization (EUA). This EUA will remain  in effect (meaning this test can be used) for the duration of the COVID-19 declaration under Section 564(b)(1) of the Act, 21 U.S.C.section 360bbb-3(b)(1), unless the authorization is terminated  or revoked sooner.       Influenza A by PCR  NEGATIVE NEGATIVE Final   Influenza B by PCR NEGATIVE NEGATIVE Final    Comment: (NOTE) The Xpert Xpress SARS-CoV-2/FLU/RSV plus assay is intended as an aid in the diagnosis of influenza from Nasopharyngeal swab specimens and should not be used as a sole basis for treatment. Nasal washings and aspirates are unacceptable for Xpert Xpress SARS-CoV-2/FLU/RSV testing.  Fact Sheet for Patients: EntrepreneurPulse.com.au  Fact Sheet for Healthcare Providers: IncredibleEmployment.be  This test is not yet approved or cleared by the [place] FDA and has been authorized for detection and/or diagnosis of SARS-CoV-2 by FDA under an Emergency Use Authorization (EUA). This EUA will remain in effect (meaning this test can be used) for the duration of the COVID-19 declaration under Section 564(b)(1) of the Act, 21 U.S.C. section 360bbb-3(b)(1), unless the authorization is terminated or revoked.  Performed at [facility], [address] [place], [place] [address]     Coagulation Studies: Recent Labs    06/14/21 1155  LABPROT 18.2*  INR 1.5*    Urinalysis: No results for input(s): COLORURINE, LABSPEC, PHURINE, GLUCOSEU, HGBUR, BILIRUBINUR, KETONESUR, PROTEINUR, UROBILINOGEN, NITRITE, LEUKOCYTESUR in the last 72 hours.  Invalid input(s): APPERANCEUR    Imaging: No results found.   Medications:    sodium chloride     sodium chloride     norepinephrine (LEVOPHED) Adult infusion     pantoprazole     pantoprazole      ondansetron (ZOFRAN) IV  4 mg Intravenous Once   [START ON 06/18/2021] pantoprazole  40 mg Intravenous Q12H   sodium bicarbonate  100 mEq Intravenous Once   ondansetron (ZOFRAN) IV  Assessment/ Plan:  87 y.o. male With past medical history of hypertension, coronary disease, chronic systolic heart failure, chronic kidney disease stage IIIb, anemia of chronic kidney disease, proteinuria who presents now with acute GI  bleeding.  1.  Acute kidney injury/chronic kidney disease stage IIIb baseline EGFR 41.  Acute kidney injury now likely secondary to acute GI bleeding hypervolemia.  Continue IV fluid hydration along with norepinephrine to stabilize blood pressure.  No immediate need for dialysis however this may need to be considered if renal function does not improve with conservative measures.  2.  Hypotension.  Norepinephrine drip has been ordered to help maintain blood pressure.  3.  Anemia of chronic kidney disease/GI bleeding.  Patient is receiving PRBC transfusion currently.  Continue to monitor CBC closely.  Plans for endoscopy as per gastroenterology.  4.  Hyperkalemia.  In part due to acute kidney injury and digested blood products.  We will attempt administer [place] 16.8 g x 1 if tolerated.  If not patient has already received shifting measures then we may need to consider dialysis should potassium continue to rise.  5.  Thanks for consultation.   LOS: 0 Alquan Morrish 9/9/202212:51 PM

## 2021-06-14 NOTE — ED Notes (Signed)
Patient having episode of vomiting blood. MD Tamala Julian made aware. New orders placed. See [facility]

## 2021-06-14 NOTE — ED Triage Notes (Signed)
Pt comes with c/o LOC and fall last night. Pt states he fell off his bed and hit his head. Family reports LOC and pt is on blood thinners.  Pt recently admitted in hospital.  Pt states pain to head.

## 2021-06-14 NOTE — Consult Note (Addendum)
[facility] Inpatient Consultation   Patient ID: Daniel Bolton is a 87 y.o. male.  Requesting Provider: Rosilyn Mings, NP  Date of Admission: 06/14/2021  Date of Consult: 06/14/21   Reason for Consultation: Suspected GIB   Patient's Chief Complaint:   Chief Complaint  Patient presents with   Loss of Consciousness    87 year old Hispanic male who presents today after ground-level fall with head strike but no LOC.  And is accompanied by his granddaughter and son at bedside.  The patient is a native [place] speaker so history is garnered via chart review and in discussion with his granddaughter and son.  Per the granddaughter the patient has a known history of chronic kidney disease and heart failure for which she is actively being medically treated.  He does have a history of cardiac stents for which he is on aspirin and Plavix which he took yesterday.  Per family members the patient had been in his normal state of health until today when they noted bloody stools with additional bloody emesis.  Interestingly, he had issues with constipation per family members.  He was found to have a hemoglobin of 7.1 on arrival with baseline around 10.  At time of evaluation he is currently receiving 1 unit of PRBCs and is on a Protonix drip.  No further episodes of hematemesis or hematochezia since arrival.  The patient denies any abdominal pain.  Granddaughter notes the patient has been complaining of lack of appetite for several days prior to his presentation. She is also noted the patient has a growing AAA now most recently measuring 4.4 cm.  He was also noted to be hyperkalemic with a potassium of 6.9 and an acute renal injury with creatinine up to 2.15.  BUN is 108, sodium 120 BNP 674. Family denies history of liver disease and alcohol abuse. No other acute GI complaints  Per family members: Denies NSAIDs, anticoagulants Denies family history of gastrointestinal disease and  malignancy Previous Endoscopies: none  Past Medical History:  Diagnosis Date   CHF (congestive heart failure) ([place])    Chronic kidney disease    Hypertension    Hyponatremia    Stroke [facility])     Past Surgical History:  Procedure Laterality Date   BRAIN SURGERY     CARDIAC SURGERY      Allergies  Allergen Reactions   Hydralazine     Pt has severe dizziness with it, and does not want to take it.    Family History  Problem Relation Age of Onset   Cancer Sister    Heart disease Sister     Social History   Tobacco Use   Smoking status: Never   Smokeless tobacco: Never  Substance Use Topics   Alcohol use: No   Drug use: No     Pertinent GI related history and allergies were reviewed with the patient's family  Review of Systems  Reason unable to perform ROS: ROS performed with patient's family due.  Constitutional:  Positive for activity change, appetite change and fatigue. Negative for fever and unexpected weight change.  Respiratory:  Positive for shortness of breath (especially on exertion). Negative for wheezing.   Cardiovascular:  Negative for chest pain, palpitations and leg swelling.  Gastrointestinal:  Positive for blood in stool, constipation, nausea and vomiting (hematemesis). Negative for abdominal distention, abdominal pain and diarrhea.  Skin:  Negative for color change.  Neurological:  Positive for dizziness, syncope, weakness and light-headedness.  Psychiatric/Behavioral:  Positive for confusion. Negative  for agitation.   All other systems reviewed and are negative.   Medications Home Medications No current facility-administered medications on file prior to encounter.   Current Outpatient Medications on File Prior to Encounter  Medication Sig Dispense Refill   amLODipine (NORVASC) 10 MG tablet Take 1 tablet (10 mg total) by mouth daily. 30 tablet 0   aspirin EC 81 MG EC tablet Take 1 tablet (81 mg total) by mouth daily. Swallow whole. 30 tablet 11    clopidogrel (PLAVIX) 75 MG tablet Take 75 mg by mouth daily.     feeding supplement (ENSURE ENLIVE / ENSURE PLUS) LIQD Take 237 mLs by mouth 2 (two) times daily between meals. 237 mL 12   furosemide (LASIX) 40 MG tablet Take 1 tablet (40 mg total) by mouth daily. 90 tablet 0   metoprolol succinate (TOPROL-XL) 50 MG 24 hr tablet Take 0.5 tablets (25 mg total) by mouth daily. Take with or immediately following a meal.     olmesartan (BENICAR) 20 MG tablet Take 20 mg by mouth daily.     simvastatin (ZOCOR) 80 MG tablet Take 80 mg by mouth daily.     Pertinent GI related medications were reviewed with the patient's family  Inpatient Medications  Current Facility-Administered Medications:    0.9 %  sodium chloride infusion, 10 mL/hr, Intravenous, Once, Vladimir Crofts, MD   0.9 %  sodium chloride infusion, 250 mL, Intravenous, Continuous, Graves, Dana E, NP   norepinephrine (LEVOPHED) '4mg'$  in 26m premix infusion, 2-10 mcg/min, Intravenous, Titrated, Graves, Dana E, NP   ondansetron (ZOFRAN) injection 4 mg, 4 mg, Intravenous, Q6H PRN, Graves, Dana E, NP, 4 mg at 06/14/21 1149   ondansetron (ZOFRAN) injection 4 mg, 4 mg, Intravenous, Once, SVladimir Crofts MD   pantoprazole (PROTONIX) 80 mg /NS 100 mL IVPB, 80 mg, Intravenous, Once, SVladimir Crofts MD   [Derrill MemoON 06/18/2021] pantoprazole (PROTONIX) injection 40 mg, 40 mg, Intravenous, Q12H, SVladimir Crofts MD   pantoprozole (PROTONIX) 80 mg /NS 100 mL infusion, 8 mg/hr, Intravenous, Continuous, SVladimir Crofts MD   patiromer ([facility] packet 25.2 g, 25.2 g, Oral, STAT, Graves, Dana E, NP   sodium bicarbonate injection 100 mEq, 100 mEq, Intravenous, Once, Graves, DRaeford Razor NP  Current Outpatient Medications:    amLODipine (NORVASC) 10 MG tablet, Take 1 tablet (10 mg total) by mouth daily., Disp: 30 tablet, Rfl: 0   aspirin EC 81 MG EC tablet, Take 1 tablet (81 mg total) by mouth daily. Swallow whole., Disp: 30 tablet, Rfl: 11   clopidogrel (PLAVIX) 75 MG  tablet, Take 75 mg by mouth daily., Disp: , Rfl:    feeding supplement (ENSURE ENLIVE / ENSURE PLUS) LIQD, Take 237 mLs by mouth 2 (two) times daily between meals., Disp: 237 mL, Rfl: 12   furosemide (LASIX) 40 MG tablet, Take 1 tablet (40 mg total) by mouth daily., Disp: 90 tablet, Rfl: 0   metoprolol succinate (TOPROL-XL) 50 MG 24 hr tablet, Take 0.5 tablets (25 mg total) by mouth daily. Take with or immediately following a meal., Disp: , Rfl:    olmesartan (BENICAR) 20 MG tablet, Take 20 mg by mouth daily., Disp: , Rfl:    simvastatin (ZOCOR) 80 MG tablet, Take 80 mg by mouth daily., Disp: , Rfl:   sodium chloride     sodium chloride     norepinephrine (LEVOPHED) Adult infusion     pantoprazole     pantoprazole      ondansetron (ZOFRAN) IV   Objective  Vitals:   06/14/21 1055 06/14/21 1100 06/14/21 1142 06/14/21 1200  BP: (!) 95/38 (!) 115/37 122/67 (!) 119/41  Pulse: (!) 49 (!) 49 75 71  Resp: '17 17 20 18  '$ Temp:   (!) 97.5 F (36.4 C) (!) 97.5 F (36.4 C)  TempSrc:   Oral Oral  SpO2: 96% 100%       Physical Exam Vitals and nursing note reviewed.  Constitutional:      General: He is not in acute distress.    Appearance: Normal appearance. He is ill-appearing. He is not toxic-appearing or diaphoretic.  HENT:     Head: Normocephalic and atraumatic.     Nose: Nose normal.     Mouth/Throat:     Mouth: Mucous membranes are moist.     Pharynx: Oropharynx is clear.  Eyes:     Extraocular Movements: Extraocular movements intact.     Comments: Pale conjunctiva  Cardiovascular:     Rate and Rhythm: Regular rhythm. Bradycardia present.  Pulmonary:     Effort: Pulmonary effort is normal. No respiratory distress.     Breath sounds: Normal breath sounds. No wheezing, rhonchi or rales.  Abdominal:     General: Abdomen is flat. Bowel sounds are normal. There is no distension.     Palpations: Abdomen is soft.     Tenderness: There is no abdominal tenderness. There is no guarding  or rebound.  Musculoskeletal:     Cervical back: Neck supple.     Right lower leg: No edema.     Left lower leg: No edema.  Skin:    General: Skin is warm and dry.     Coloration: Skin is not jaundiced or pale.  Neurological:     General: No focal deficit present.     Mental Status: He is alert. Mental status is at baseline.  Psychiatric:        Mood and Affect: Mood normal.        Behavior: Behavior normal.        Thought Content: Thought content normal.        Judgment: Judgment normal.    Laboratory Data Recent Labs  Lab 06/14/21 0915  WBC 7.6  HGB 7.1*  HCT 20.9*  PLT 132*   Recent Labs  Lab 06/14/21 0915 06/14/21 1155  NA 120* 125*  K 6.9* 5.2*  CL 93* 99  CO2 17* 18*  BUN 108* 105*  CALCIUM 8.0* 7.6*  PROT  --  4.8*  BILITOT  --  0.7  ALKPHOS  --  95  ALT  --  42  AST  --  36  GLUCOSE 86 73   Recent Labs  Lab 06/14/21 1155  INR 1.5*       Imaging Studies: No pertinent GI imaging studies  Assessment:   # Suspected Upper GIB  - receiving 1 u prbc  - BUN/CR 108/2.15  - initially presented hypotensive, improving with resuscitation  - on asa plavix- taken on 06/13/21  - ddx includes ulcer, dieulafoy, avms   - consider fistula given patient's AAA # Hemorrhagic shock- improving with resuscitation # hyperkalemia- K+ 6.9 # hyponatremia # thrombocytopenia # coaguloapthy- inr 1.5 # AKI on CKD # Syncopal episode # bradycardia # Combined systolic, diastolic CHF; EF [id number]; grade 2 dd # AAA- increasing in size 4.4  Plan:  Currently on protonix gtt. Monitor H&H transfusion and resuscitation as per primary team  - will need resuscitation prior to endoscopy;  - Tentatively plan for EGD tomorrow  pending patient stability - NPO now; CBC, BMP, INR in am Consider CTA to rule out gastro or enteric fistula. Would also r/o retroperitoneal bleed given syncope although less likely given hematemesis and hematochezia. Would also help determining location of  potential bleeding Continue to monitor for signs of GI bleeding Continue to hold aspirin and Plavix as well as other DVT prophylaxis  Electrolyte management as per primary team.  Discussed case with anesthesia and will need his potassium improved prior to intervention.  No history of liver disease- no indication for abx or octreotide at this time from bleeding perspective  Discussed case with Critical Care Team Member  I personally performed the service.  Management of other medical comorbidities as per primary team  Thank you for allowing [place] to participate in this patient's care. Please don't hesitate to call if any questions or concerns arise.   Annamaria Helling, DO [facility]  Portions of the record may have been created with voice recognition software. Occasional wrong-word or 'sound-a-like' substitutions may have occurred due to the inherent limitations of voice recognition software.  Read the chart carefully and recognize, using context, where substitutions may have occurred.

## 2021-06-14 NOTE — ED Notes (Signed)
CBG 89 

## 2021-06-14 NOTE — ED Notes (Signed)
Veltassa and lokema held at this time per [facility] NP due to emesis of blood

## 2021-06-14 NOTE — ED Notes (Signed)
RN notified of pt VS. RN @ bedside starting fluids.

## 2021-06-14 NOTE — ED Provider Notes (Signed)
[facility] Emergency Department Provider Note ____________________________________________   Event Date/Time   First MD Initiated Contact with Patient 06/14/21 [phone]     (approximate)  I have reviewed the triage vital signs and the nursing notes.  HISTORY  Chief Complaint Loss of Consciousness   HPI Daniel Bolton is a 87 y.o. malewho presents to the ED for evaluation of fall and syncope.   Chart review indicates history of EF 25%, HTN, HLD, stroke and CKD 3.  CAD s/p CABG.  DAPT with Plavix. Recent brief hospitalization due to hyponatremia and CHF exacerbation, symptomatic bradycardia with rates in the 40s, so his Toprol-XL was cut in half.  Patient presents to the ED, accompanied by his son who provides some additional history, for evaluation of worsening confusion and falls.  History is limited due to patient's confusion and somnolence.  He reports that he fell this morning at 4 AM while getting up from the bed to avoid., Despite being triaged as syncope, he tells me that he just fell to the ground and struck his head, but did not lose consciousness.  He reports mild pain to his head, and denies pain elsewhere to his body.  Son, who is at the bedside, reports that he has been more confused for the past few days, seemingly worsening since he was discharged from the hospital.  Son reports that he has fallen a couple times in the past few days as well.  Due to this increasing confusion alongside the fall this morning, they present to the ED for evaluation.  History and physical obtained with the assistance of in person Spanish interpreter  Past Medical History:  Diagnosis Date  . CHF (congestive heart failure) ([place])   . Chronic kidney disease   . Hypertension   . Hyponatremia   . Stroke [facility])     Patient Active Problem List   Diagnosis Date Noted  . Acute on chronic combined systolic and diastolic CHF (congestive heart failure) ([place]) 06/04/2021   . Abdominal pain 06/04/2021  . Constipation 06/04/2021  . Left hip pain 06/04/2021  . Sinus bradycardia 06/04/2021  . Stroke ([place])   . HLD (hyperlipidemia)   . Hyperkalemia   . AAA (abdominal aortic aneurysm) ([address] [address])   . Elevated troponin   . Protein-calorie malnutrition, severe 04/12/2021  . Essential hypertension 03/22/2021  . Acute on chronic diastolic CHF (congestive heart failure) ([place]) 03/22/2021  . Hyponatremia 03/22/2021  . Acute on chronic systolic CHF (congestive heart failure) ([place]) 03/11/2021  . Hypertensive urgency 03/11/2021  . Acute renal failure superimposed on stage 3b chronic kidney disease ([place]) 03/11/2021  . Normochromic normocytic anemia 03/11/2021  . Acute CHF (congestive heart failure) ([place]) 03/11/2021  . Moderate major depression, single episode ([place]) 08/01/2015  . Coronary artery disease 08/01/2015  . Financial problems 08/01/2015    Past Surgical History:  Procedure Laterality Date  . BRAIN SURGERY    . CARDIAC SURGERY      Prior to Admission medications   Medication Sig Start Date End Date Taking? Authorizing Provider  amLODipine (NORVASC) 10 MG tablet Take 1 tablet (10 mg total) by mouth daily. 06/07/21   Jennye Boroughs, MD  aspirin EC 81 MG EC tablet Take 1 tablet (81 mg total) by mouth daily. Swallow whole. 03/14/21   Danford, Suann Larry, MD  clopidogrel (PLAVIX) 75 MG tablet Take 75 mg by mouth daily.    [provider]  feeding supplement (ENSURE ENLIVE / ENSURE PLUS) LIQD Take 237 mLs  by mouth 2 (two) times daily between meals. 04/14/21   Enzo Bi, MD  furosemide (LASIX) 40 MG tablet Take 1 tablet (40 mg total) by mouth daily. 04/14/21 07/13/21  Enzo Bi, MD  metoprolol succinate (TOPROL-XL) 50 MG 24 hr tablet Take 0.5 tablets (25 mg total) by mouth daily. Take with or immediately following a meal. 06/06/21   Jennye Boroughs, MD  olmesartan (BENICAR) 20 MG tablet Take 20 mg by mouth daily.    [provider]  simvastatin (ZOCOR) 80  MG tablet Take 80 mg by mouth daily.    [provider]    Allergies Hydralazine  Family History  Problem Relation Age of Onset  . Cancer Sister   . Heart disease Sister     Social History Social History   Tobacco Use  . Smoking status: Never  . Smokeless tobacco: Never  Substance Use Topics  . Alcohol use: No  . Drug use: No    Review of Systems  Unable to be accurately assessed due to patient's altered mentation _________________________________________   PHYSICAL EXAM:  VITAL SIGNS: Vitals:   06/14/21 1055 06/14/21 1100  BP: (!) 95/38 (!) 115/37  Pulse: (!) 49 (!) 49  Resp: 17 17  Temp:    SpO2: 96% 100%     Constitutional: Sleepy and ill-appearing.  Seems confused with some inappropriate answers via the Spanish translator. Eyes: Conjunctivae are pale. PERRL. EOMI. Head: Atraumatic. Nose: No congestion/rhinnorhea. Mouth/Throat: Mucous membranes are dry.  Oropharynx non-erythematous. Neck: No stridor. No cervical spine tenderness to palpation. Cardiovascular: Bradycardic rate, regular rhythm. Grossly normal heart sounds.  Good peripheral circulation. Respiratory: Mild tachypnea to the low 20s.  No retractions. Lungs CTAB. Gastrointestinal: Soft , nondistended Musculoskeletal: No lower extremity tenderness nor edema.  No joint effusions. No signs of acute trauma. No signs of deformity or trauma to the extremities or back.  No spinal step-offs. Neurologic: No gross focal neurologic deficits are appreciated.  Follows commands in all 4 extremities.  Rolls over onto his back for me to examine his spine. Skin:  Skin is warm, dry and intact. No rash noted. Psychiatric: Mood and affect are normal. Speech and behavior are normal.  ____________________________________________   LABS (all labs ordered are listed, but only abnormal results are displayed)  Labs Reviewed  BASIC METABOLIC PANEL - Abnormal; Notable for the following components:      Result  Value   Sodium 120 (*)    Potassium 6.9 (*)    Chloride 93 (*)    CO2 17 (*)    BUN 108 (*)    Creatinine, Ser 2.15 (*)    Calcium 8.0 (*)    GFR, Estimated 29 (*)    All other components within normal limits  CBC - Abnormal; Notable for the following components:   RBC 2.37 (*)    Hemoglobin 7.1 (*)    HCT 20.9 (*)    RDW 16.0 (*)    Platelets 132 (*)    All other components within normal limits  RESP PANEL BY RT-PCR (FLU A&B, COVID) ARPGX2  URINALYSIS, COMPLETE (UACMP) WITH MICROSCOPIC  CBG MONITORING, ED  TYPE AND SCREEN  PREPARE RBC (CROSSMATCH)  ABO/RH   ____________________________________________  12 Lead EKG  Sinus bradycardia with a rate of 48 bpm.  Normal axis.  First-degree AV block with PR interval 236 ms.  Intervals otherwise appropriate.  Lateral and inferior T wave inversions at baseline when compared to EKG from 10 days ago. ____________________________________________  RADIOLOGY  ED MD interpretation: 1 view CXR reviewed by me   Official radiology report(s): No results found.  ____________________________________________   PROCEDURES and INTERVENTIONS  Procedure(s) performed (including Critical Care):  .1-3 Lead EKG Interpretation Performed by: Vladimir Crofts, MD Authorized by: Vladimir Crofts, MD     Interpretation: abnormal     ECG rate:  42   ECG rate assessment: bradycardic     Rhythm: sinus bradycardia     Ectopy: none     Conduction: normal   .Critical Care Performed by: Vladimir Crofts, MD Authorized by: Vladimir Crofts, MD   Critical care provider statement:    Critical care time (minutes):  45   Critical care was necessary to treat or prevent imminent or life-threatening deterioration of the following conditions:  Circulatory failure, metabolic crisis and renal failure   Critical care was time spent personally by me on the following activities:  Discussions with consultants, evaluation of patient's response to treatment, examination of  patient, ordering and performing treatments and interventions, ordering and review of laboratory studies, ordering and review of radiographic studies, pulse oximetry, re-evaluation of patient's condition, obtaining history from patient or surrogate and review of old charts  Medications  sodium zirconium cyclosilicate (LOKELMA) packet 10 g (10 g Oral Patient Refused/Not Given 06/14/21 1051)  calcium gluconate 1 g/ 50 mL sodium chloride IVPB (1,000 mg Intravenous New Bag/Given 06/14/21 1055)  0.9 %  sodium chloride infusion (has no administration in time range)  sodium chloride 0.9 % bolus 1,000 mL (0 mLs Intravenous Stopped 06/14/21 1036)  furosemide (LASIX) injection 40 mg (40 mg Intravenous Given 06/14/21 1044)  albuterol (PROVENTIL) (2.5 MG/3ML) 0.083% nebulizer solution 10 mg (10 mg Nebulization Given 06/14/21 1058)  insulin aspart (novoLOG) injection 10 Units (10 Units Intravenous Given 06/14/21 1046)    And  dextrose 50 % solution 50 mL (50 mLs Intravenous Given 06/14/21 1048)  sodium chloride 0.9 % bolus 1,000 mL (1,000 mLs Intravenous New Bag/Given 06/14/21 1056)    ____________________________________________   MDM / ED COURSE   87 year old male returns to the ED with confusion, falls and nonfocal symptoms with evidence of GI bleeding, renal failure and hyponatremia requiring ICU admission.  Presents with soft blood pressures due to a low diastolic BP.  His exam is nonfocal without evidence of acute trauma, neurologic deficits or vascular deficits.  Blood work shows multiple metabolic and hematologic derangements with acute renal failure and hyperkalemia, hyponatremia as well as acute blood loss anemia.  EKG without ischemic features , does have a slightly prolonged PR interval but no more severe indications of hyperkalemia.  Temporizing measures provided for his hyperkalemia, as well as IV fluids to improve his renal dysfunction and hyponatremia.  Hemoglobin of 7, but he has active bleeding with  melanotic stools in the ED, so we will transfuse a unit of packed cells.  We will discuss with the ICU for admission.  Clinical Course as of 06/14/21 1131  Fri Jun 14, 2021  1044 Reassessed.  Patient just ambulated back to the stretcher from the toilet where he passed a bloody bowel movement.  Son now tells me that he had a bloody bowel event last night too.   I find the nurse who is just now starting hyperkalemic treatments.  We discussed plan of care, my recommendation for another IV and that I will be ordering a unit of blood. [DS]  [id number] Reassessed with interpreter again. We discuss critical illness.  Patient reports that he has never filled out any forms  such as a DNR or living will.  We discussed if you would want to be placed on a ventilator or have CPR done if he were to get more sick, and he says yes that he would want this.  He wishes to pursue full code.  I discussed his critical illness and multiorgan failure with patient and the son at the bedside.  We discussed ICU admission and poor prognosis.  They report understanding. [DS]  1107 ICU paged [DS]  1130 I discuss with Hinton Dyer, ICU NP, who will admit [DS]    Clinical Course User Index [DS] Vladimir Crofts, MD    ____________________________________________   FINAL CLINICAL IMPRESSION(S) / ED DIAGNOSES  Final diagnoses:  Confusion  Hyperkalemia  Hyponatremia  Gastrointestinal hemorrhage with melena  AKI (acute kidney injury) [facility])     ED Discharge Orders     None        Rashiya Lofland   Note:  This document was prepared using Dragon voice recognition software and may include unintentional dictation errors.    Vladimir Crofts, MD 06/14/21 1126

## 2021-06-14 NOTE — Progress Notes (Addendum)
PHARMACY CONSULT NOTE - FOLLOW UP  Pharmacy Consult for Electrolyte Monitoring and Replacement   Recent Labs: Potassium (mmol/L)  Date Value  06/14/2021 6.9 (HH)   Magnesium (mg/dL)  Date Value  06/06/2021 2.0   Calcium (mg/dL)  Date Value  06/14/2021 8.0 (L)   Albumin (g/dL)  Date Value  06/03/2021 3.3 (L)   Sodium (mmol/L)  Date Value  06/14/2021 120 (L)   Corrected Calcium: 8.56  Assessment: Patient is an 87 y/o M with medical history including systolic HF with an EF of 25%, HTN, HLD, CVA, CKD, CAD s/p CABG who was recently admitted for hyponatremia, CHF exacerbation, and bradycardia who is now presenting with syncope. Pharmacy consulted to assist with electrolyte monitoring and replacement as indicated.  Labs notable for acute hyponatremia, hyperkalemia, non-anion gap metabolic acidosis, Scr 0000000 (BL~1.5) and Hgb 7.1 (BL 10)  Goal of Therapy:  WNL  Plan:  -Na 120-patient has received NS bolus x 2 -K 6.9   -Potassium shifters: albuterol 10 mg nebulized x 1 + D50 x 1 + insulin 10 units   -Potassium lowering therapy: Veltassa 25.1 g x 1 + furosemide 40 mg IV x 1  -Cardioprotection: calcium gluconate 1,000 mg x 1  Repeat BMP ordered for 1300 Pharmacy will continue to follow along   Wynelle Cleveland, PharmD Pharmacy Resident  06/14/2021 11:51 AM

## 2021-06-15 ENCOUNTER — Inpatient Hospital Stay: Payer: Medicare HMO | Admitting: Anesthesiology

## 2021-06-15 ENCOUNTER — Encounter
Admission: EM | Disposition: A | Discharge status: Home Health | Payer: Self-pay | Source: Home / Self Care | Attending: Pulmonary Disease

## 2021-06-15 ENCOUNTER — Encounter: Payer: Self-pay | Admitting: Pulmonary Disease

## 2021-06-15 HISTORY — PX: ESOPHAGOGASTRODUODENOSCOPY: SHX5428

## 2021-06-15 LAB — PHOSPHORUS: Phosphorus: 5 mg/dL — ABNORMAL HIGH (ref 2.5–4.6)

## 2021-06-15 LAB — CBC
HCT: 26.4 % — ABNORMAL LOW (ref 39.0–52.0)
Hemoglobin: 9.5 g/dL — ABNORMAL LOW (ref 13.0–17.0)
MCH: 29.6 pg (ref 26.0–34.0)
MCHC: 36 g/dL (ref 30.0–36.0)
MCV: 82.2 fL (ref 80.0–100.0)
Platelets: 113 10*3/uL — ABNORMAL LOW (ref 150–400)
RBC: 3.21 MIL/uL — ABNORMAL LOW (ref 4.22–5.81)
RDW: 16 % — ABNORMAL HIGH (ref 11.5–15.5)
WBC: 8 10*3/uL (ref 4.0–10.5)
nRBC: 0 % (ref 0.0–0.2)

## 2021-06-15 LAB — MAGNESIUM: Magnesium: 2.5 mg/dL — ABNORMAL HIGH (ref 1.7–2.4)

## 2021-06-15 LAB — PROTIME-INR
INR: 1.3 — ABNORMAL HIGH (ref 0.8–1.2)
Prothrombin Time: 15.9 seconds — ABNORMAL HIGH (ref 11.4–15.2)

## 2021-06-15 LAB — BASIC METABOLIC PANEL
Anion gap: 3 — ABNORMAL LOW (ref 5–15)
BUN: 99 mg/dL — ABNORMAL HIGH (ref 8–23)
CO2: 24 mmol/L (ref 22–32)
Calcium: 8.8 mg/dL — ABNORMAL LOW (ref 8.9–10.3)
Chloride: 105 mmol/L (ref 98–111)
Creatinine, Ser: 2.12 mg/dL — ABNORMAL HIGH (ref 0.61–1.24)
GFR, Estimated: 30 mL/min — ABNORMAL LOW (ref >60)
Glucose, Bld: 100 mg/dL — ABNORMAL HIGH (ref 70–99)
Potassium: 5 mmol/L (ref 3.5–5.1)
Sodium: 132 mmol/L — ABNORMAL LOW (ref 135–145)

## 2021-06-15 LAB — HEMOGLOBIN AND HEMATOCRIT, BLOOD
HCT: 27.3 % — ABNORMAL LOW (ref 39.0–52.0)
HCT: 26.9 % — ABNORMAL LOW (ref 39.0–52.0)
HCT: 27.1 % — ABNORMAL LOW (ref 39.0–52.0)
Hemoglobin: 9.7 g/dL — ABNORMAL LOW (ref 13.0–17.0)
Hemoglobin: 9.4 g/dL — ABNORMAL LOW (ref 13.0–17.0)
Hemoglobin: 9.5 g/dL — ABNORMAL LOW (ref 13.0–17.0)

## 2021-06-15 LAB — GLUCOSE, CAPILLARY
Glucose-Capillary: 63 mg/dL — ABNORMAL LOW (ref 70–99)
Glucose-Capillary: 114 mg/dL — ABNORMAL HIGH (ref 70–99)
Glucose-Capillary: 82 mg/dL (ref 70–99)
Glucose-Capillary: 99 mg/dL (ref 70–99)
Glucose-Capillary: 115 mg/dL — ABNORMAL HIGH (ref 70–99)
Glucose-Capillary: 104 mg/dL — ABNORMAL HIGH (ref 70–99)

## 2021-06-15 LAB — POTASSIUM: Potassium: 5.4 mmol/L — ABNORMAL HIGH (ref 3.5–5.1)

## 2021-06-15 SURGERY — ESOPHAGOGASTRODUODENOSCOPY (EGD) WITH PROPOFOL
Anesthesia: Monitor Anesthesia Care

## 2021-06-15 SURGERY — EGD (ESOPHAGOGASTRODUODENOSCOPY)
Anesthesia: General

## 2021-06-15 MED ORDER — SODIUM CHLORIDE (PF) 0.9 % IJ SOLN
PREFILLED_SYRINGE | INTRAMUSCULAR | Status: DC | PRN
  Administered 2021-06-15: 5 mL

## 2021-06-15 MED ORDER — DEXTROSE 50 % IV SOLN
INTRAVENOUS | Status: AC
  Administered 2021-06-15: 12.5 g via INTRAVENOUS
  Filled 2021-06-15: qty 50

## 2021-06-15 MED ORDER — GLYCOPYRROLATE 0.2 MG/ML IJ SOLN
INTRAMUSCULAR | Status: DC | PRN
  Administered 2021-06-15: .1 mg via INTRAVENOUS

## 2021-06-15 MED ORDER — PANTOPRAZOLE SODIUM 40 MG IV SOLR
40.0000 mg | Freq: Two times a day (BID) | INTRAVENOUS | Status: DC
  Administered 2021-06-15 – 2021-06-16 (×3): 40 mg via INTRAVENOUS
  Filled 2021-06-15 (×3): qty 40

## 2021-06-15 MED ORDER — LABETALOL HCL 5 MG/ML IV SOLN
INTRAVENOUS | Status: DC | PRN
Start: 2021-06-15 — End: 2021-06-15
  Administered 2021-06-15: 5 mg via INTRAVENOUS

## 2021-06-15 MED ORDER — PROPOFOL 10 MG/ML IV BOLUS
INTRAVENOUS | Status: AC
  Filled 2021-06-15: qty 20

## 2021-06-15 MED ORDER — PROPOFOL 500 MG/50ML IV EMUL
INTRAVENOUS | Status: AC
  Filled 2021-06-15: qty 50

## 2021-06-15 MED ORDER — PROPOFOL 500 MG/50ML IV EMUL
INTRAVENOUS | Status: DC | PRN
  Administered 2021-06-15: 50 ug/kg/min via INTRAVENOUS

## 2021-06-15 MED ORDER — PHENYLEPHRINE HCL (PRESSORS) 10 MG/ML IV SOLN
INTRAVENOUS | Status: AC
  Filled 2021-06-15: qty 1

## 2021-06-15 MED ORDER — DEXTROSE 50 % IV SOLN: 12.5000 g | INTRAVENOUS | Status: AC

## 2021-06-15 MED ORDER — GLYCOPYRROLATE 0.2 MG/ML IJ SOLN
INTRAMUSCULAR | Status: AC
  Filled 2021-06-15: qty 1

## 2021-06-15 MED ORDER — LIDOCAINE HCL (CARDIAC) PF 100 MG/5ML IV SOSY
PREFILLED_SYRINGE | INTRAVENOUS | Status: DC | PRN
  Administered 2021-06-15: 50 mg via INTRAVENOUS

## 2021-06-15 MED ORDER — LIDOCAINE HCL (PF) 2 % IJ SOLN
INTRAMUSCULAR | Status: AC
  Filled 2021-06-15: qty 5

## 2021-06-15 NOTE — H&P (View-Only) (Signed)
[facility] Gastroenterology Inpatient Follow-up/Progress Note   Patient ID: Daniel Bolton is a 87 y.o. male.  Overnight Events / Subjective Findings Information garnered via nursing and translator Multiple bowel movements overnight with clots/blood.  He was briefly on Levophed but has not required for most of the night.  He did receive 3 units of PRBCs in total with appropriate rise in hemoglobin.  The patient denies any nausea abdominal pain or vomiting.  No other acute GI complaints  Review of Systems  Constitutional:        Garnered through interpreter  Gastrointestinal:  Positive for blood in stool (clots per nursing) and diarrhea. Negative for abdominal pain, constipation, nausea and vomiting.  Psychiatric/Behavioral:  Negative for confusion.   All other systems reviewed and are negative.   Medications  Current Facility-Administered Medications:    0.9 %  sodium chloride infusion (Manually program via Guardrails IV Fluids), , Intravenous, Once, Graves, Dana E, NP   0.9 %  sodium chloride infusion, 10 mL/hr, Intravenous, Once, Vladimir Crofts, MD   0.9 %  sodium chloride infusion, 250 mL, Intravenous, Continuous, Graves, Dana E, NP   0.9 %  sodium chloride infusion, , Intravenous, Continuous, Annamaria Helling, DO, Stopped at 06/14/21 2323   acetaminophen (TYLENOL) tablet 650 mg, 650 mg, Oral, Q6H PRN, Graves, Raeford Razor, NP, 650 mg at 06/14/21 1519   Chlorhexidine Gluconate Cloth 2 % PADS 6 each, 6 each, Topical, Daily, Aleskerov, Fuad, MD   ipratropium-albuterol (DUONEB) 0.5-2.5 (3) MG/3ML nebulizer solution 3 mL, 3 mL, Nebulization, Q6H PRN, Graves, Raeford Razor, NP   lactated ringers infusion, , Intravenous, Continuous, Graves, Raeford Razor, NP, Held at 06/14/21 1925   norepinephrine (LEVOPHED) '4mg'$  in 244m premix infusion, 2-10 mcg/min, Intravenous, Titrated, Graves, Dana E, NP, Stopped at 06/14/21 1414   ondansetron (ZOFRAN) injection 4 mg, 4 mg, Intravenous, Q6H PRN, Graves, Dana E, NP,  4 mg at 06/14/21 1149   ondansetron (ZOFRAN) injection 4 mg, 4 mg, Intravenous, Once, SVladimir Crofts MD   [Derrill MemoON 06/18/2021] pantoprazole (PROTONIX) injection 40 mg, 40 mg, Intravenous, Q12H, SVladimir Crofts MD   pantoprozole (PROTONIX) 80 mg /NS 100 mL infusion, 8 mg/hr, Intravenous, Continuous, SVladimir Crofts MD, Last Rate: 10 mL/hr at 06/15/21 0600, 8 mg/hr at 06/15/21 0600  sodium chloride     sodium chloride     sodium chloride Stopped (06/14/21 2323)   lactated ringers Stopped (06/14/21 1925)   norepinephrine (LEVOPHED) Adult infusion Stopped (06/14/21 1414)   pantoprazole 8 mg/hr (06/15/21 0600)    acetaminophen, ipratropium-albuterol, ondansetron (ZOFRAN) IV   Objective    Vitals:   06/15/21 0500 06/15/21 0600 06/15/21 0700 06/15/21 0800  BP: (!) 145/44 (!) 156/48 (!) 147/52 (!) 136/50  Pulse: 62 67 65 65  Resp: 14 (!) '25 16 15  '$ Temp:   97.8 F (36.6 C)   TempSrc:      SpO2: 100% 100% 100% 100%  Weight:      Height:         Physical Exam Vitals and nursing note reviewed.  Constitutional:      General: He is not in acute distress.    Appearance: He is ill-appearing. He is not toxic-appearing or diaphoretic.     Comments: Frail appearing  HENT:     Head: Normocephalic and atraumatic.     Nose: Nose normal.     Mouth/Throat:     Mouth: Mucous membranes are moist.     Pharynx: Oropharynx is clear.  Eyes:  General: No scleral icterus.    Extraocular Movements: Extraocular movements intact.  Cardiovascular:     Rate and Rhythm: Normal rate and regular rhythm.     Heart sounds: Normal heart sounds. No murmur heard.   No friction rub. No gallop.  Pulmonary:     Effort: Pulmonary effort is normal. No respiratory distress.     Breath sounds: Normal breath sounds. No wheezing, rhonchi or rales.  Abdominal:     General: Abdomen is flat. Bowel sounds are normal. There is no distension.     Palpations: Abdomen is soft.     Tenderness: There is no abdominal  tenderness. There is no guarding or rebound.  Musculoskeletal:     Cervical back: Neck supple.     Right lower leg: No edema.     Left lower leg: No edema.  Skin:    General: Skin is warm and dry.     Coloration: Skin is not jaundiced or pale.  Neurological:     General: No focal deficit present.     Mental Status: He is alert and oriented to person, place, and time. Mental status is at baseline.  Psychiatric:        Mood and Affect: Mood normal.        Behavior: Behavior normal.        Thought Content: Thought content normal.        Judgment: Judgment normal.     Laboratory Data Recent Labs  Lab 06/14/21 0915 06/14/21 1519 06/15/21 0029 06/15/21 0513  WBC 7.6 8.8  --  8.0  HGB 7.1* 6.9* 9.7* 9.5*  HCT 20.9* 19.8* 27.3* 26.4*  PLT 132* 110*  --  113*  NEUTOPHILPCT  --  89  --   --   LYMPHOPCT  --  5  --   --   MONOPCT  --  5  --   --   EOSPCT  --  0  --   --    Recent Labs  Lab 06/14/21 0915 06/14/21 1155 06/15/21 0029 06/15/21 0513  NA 120* 125*  --  132*  K 6.9* 5.2* 5.4* 5.0  CL 93* 99  --  105  CO2 17* 18*  --  24  BUN 108* 105*  --  99*  CREATININE 2.15* 2.23*  --  2.12*  CALCIUM 8.0* 7.6*  --  8.8*  PROT  --  4.8*  --   --   BILITOT  --  0.7  --   --   ALKPHOS  --  95  --   --   ALT  --  42  --   --   AST  --  36  --   --   GLUCOSE 86 73  --  100*   Recent Labs  Lab 06/14/21 1155 06/15/21 0513  INR 1.5* 1.3*      Imaging Studies: CT HEAD WO CONTRAST (5MM)  Result Date: 06/14/2021 CLINICAL DATA:  Trauma EXAM: CT HEAD WITHOUT CONTRAST TECHNIQUE: Contiguous axial images were obtained from the base of the skull through the vertex without intravenous contrast. COMPARISON:  CT head 05/16/2021, brain MRI 01/18/2008 FINDINGS: Brain: There is no evidence of acute intracranial hemorrhage, extra-axial fluid collection, or acute infarct. Global parenchymal volume loss is unchanged. 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 The ventricles are stable in size. There is no mass lesion. There is no midline shift. Vascular: There is calcification of the bilateral cavernous ICAs. Skull:  Deformity of the frontal calvarium is unchanged since 2009. There is no evidence of acute fracture. There is no suspicious osseous lesion. Sinuses/Orbits: The paranasal sinuses are clear. The globes and orbits are unremarkable. Other: None. IMPRESSION: 1. No acute intracranial hemorrhage or calvarial fracture. 2. Stable remote infarcts as above. Electronically Signed   By: Valetta Mole M.D.   On: 06/14/2021 11:45   US AORTA DUPLEX LIMITED  Result Date: 06/14/2021 CLINICAL DATA:  Abdominal aortic aneurysm EXAM: ULTRASOUND OF ABDOMINAL AORTA TECHNIQUE: Ultrasound examination of the abdominal aorta and proximal common iliac arteries was performed to evaluate for aneurysm. Additional color and Doppler images of the distal aorta were obtained to document patency. COMPARISON:  06/04/2021 FINDINGS: Abdominal aortic measurements as follows: Proximal:  2.3 x 2.3 cm Mid:  2.3 x 2.7 cm Distal:  4.3 x 4.7 cm, significant atheromatous plaque Patent: Yes, peak systolic velocity is 0000000 cm/s Right common iliac artery: Not visualized due to bowel gas. Left common iliac artery: Not visualized due to bowel gas. IMPRESSION: 1. Infrarenal abdominal aortic aneurysm measuring 4.3 x 4.7 cm. Recommend follow-up CT/MR every 6 months and vascular consultation. This recommendation follows ACR consensus guidelines: White Paper of the ACR Incidental Findings Committee II on Vascular Findings. J Am Coll Radiol 2013; 10:789-794. Electronically Signed   By: Randa Ngo M.D.   On: 06/14/2021 19:38   CT C SPINE LTD WO OR W CONTRAST  Result Date: 06/14/2021 CLINICAL DATA:  Fall EXAM: CT CERVICAL SPINE WITHOUT CONTRAST TECHNIQUE: Multidetector CT imaging of the cervical spine was performed without intravenous contrast. Multiplanar CT image reconstructions were also  generated. COMPARISON:  CT cervical spine 05/16/2021 FINDINGS: Image quality is degraded by motion artifact. Additionally, the cervical spine is only image through the C6 vertebral body. Alignment: There is trace anterolisthesis of C4 on C5, unchanged. There is slight focal kyphosis centered at C6. There is no traumatic malalignment to the level imaged. There is no jumped or perched facet. Skull base and vertebrae: Skull base alignment is within normal limits. Vertebral body heights are preserved. There is no evidence of acute fracture. Soft tissues and spinal canal: No prevertebral fluid or swelling. No visible canal hematoma. Disc levels: There is bulky uncovertebral arthropathy at C5-C6. There is multilevel neural foraminal stenosis, unchanged. The osseous spinal canal is patent. Upper chest: Not imaged Other: None. IMPRESSION: 1. Image quality is degraded by motion artifact, and the cervical spine is only imaged through the C6 vertebral body. 2. Within these confines, no acute fracture or traumatic malalignment of the level imaged. Electronically Signed   By: Valetta Mole M.D.   On: 06/14/2021 12:33   DG Chest Portable 1 View  Result Date: 06/14/2021 CLINICAL DATA:  falls, confused. eval infiltrate,p tx EXAM: PORTABLE CHEST 1 VIEW COMPARISON:  Radiograph 06/03/2021, chest CT 06/05/2019 FINDINGS: Unchanged cardiomediastinal silhouette with prior median sternotomy and postsurgical changes of CABG. There are diffuse mild interstitial opacities. There is a small right pleural effusion. No visible pneumothorax. There is no acute osseous abnormality on single frontal view of the chest. IMPRESSION: Mild pulmonary edema and small right pleural effusion. Electronically Signed   By: Maurine Simmering M.D.   On: 06/14/2021 10:24    Assessment:   # Suspected Upper GIB             - s/p 3 u prbc with appropriate improvement             - BUN/CR 108/2.15             -  initially presented hypotensive, improving with  resuscitation             - on asa plavix- taken on 06/13/21             - ddx includes ulcer, dieulafoy, avms                         - consider fistula given patient's AAA # Hemorrhagic shock- improving with resuscitation # hyperkalemia- resolved- now 5.0 # hyponatremia # thrombocytopenia # coaguloapthy- improving # AKI on CKD # Syncopal episode # bradycardia # Combined systolic, diastolic CHF; 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 NPO since midnight Consent garnered through translator Monitor H&H transfusion and resuscitation as per primary team Nursing has updated granddaughter Currently on ppi gtt CBC, BMP, INR reviewed this morning Electrolyte management as per primary team Further recommendations pending endoscopy  - if becomes hemodynamically unstable, consider CTA to rule out gastro or enteric fistula. Would also r/o retroperitoneal bleed given syncope although less likely given hematemesis and hematochezia. Would also help determining location of potential bleeding Continue to monitor for signs of GI bleeding Continue to hold aspirin and Plavix as well as other DVT prophylaxis   Esophagogastroduodenoscopy with possible biopsy, control of bleeding, polypectomy, and interventions as necessary has been discussed with the patient/patient representative. Informed consent was obtained from the patient/patient representative after explaining the indication, nature, and risks of the procedure including but not limited to death, bleeding, perforation, missed neoplasm/lesions, cardiorespiratory compromise, and reaction to medications. Opportunity for questions was given and appropriate answers were provided. Patient/patient representative has verbalized understanding is amenable to undergoing the procedure.  I personally performed the service.  Management of other medical comorbidities as per primary team  Thank you for allowing [place] to participate in  this patient's care. Please don't hesitate to call if any questions or concerns arise.   [place] [id number] [facility] Gastroenterology  Portions of the record may have been created with voice recognition software. Occasional wrong-word or 'sound-a-like' substitutions may have occurred due to the inherent limitations of voice recognition software.  Read the chart carefully and recognize, using context, where substitutions may have occurred.

## 2021-06-15 NOTE — Progress Notes (Addendum)
NAME:  Teryl Pezzano, MRN:  [id number], DOB:  [birth year], LOS: 1 ADMISSION DATE:  06/14/2021, CONSULTATION DATE: 06/14/2021 REFERRING MD: Dr. Tamala Julian, CHIEF COMPLAINT: Fall and syncope   History of Present Illness:  This is an 87 yo male who presented to [facility] ER on 09/9 for evaluation of a fall and syncope.  Per ER notes the pt reported having a fall early the morning of 09/9 around 04:00 am during which he struck his head, but did not lose consciousness.  He also reported hallucinations overnight "seeing his granddaughter" who was not at his home.  He also has had constipation over the past couple of days, however he had a bloody bowel movement overnight.  It was also reported the pt has fallen a couple of times over the past few days.  He was recently discharged from [facility] on 09/1 following treatment of acute on chronic combined systolic and diastolic CHF, sinus bradycardia, and hypertensive emergency.  He also saw his outpatient Cardiologist on 09/6 with recommendations to continue dual anti-platelet therapy; reduce dose of metoprolol xl from 25 mg daily to 12.5 mg daily due to recent bradycardia; continue furosemide but remain off K+, continue simvastatin; and continue olmesartan.    06/15/21-patient s/p endoscopy with finding of duodenal ulcers and gastric ulcers. Family is at bedside.   Pertinent  Medical History  Stage 3b Chronic Kidney Disease CAD Essential HTN  Constipation  Stroke  HLD  AAA (abdominal aortic aneurysm) Chronic combined systolic and diastolic CHF  BPH CABG   Significant Hospital Events: Including procedures, antibiotic start and stop dates in addition to other pertinent events   09/9: Pt admitted to ICU with acute on chronic renal failure with hyperkalemia and metabolic acidosis and hemorrhagic shock secondary to acute GI bleed 09/9: CT Head>> 09/9 US Aorta Duplex Limited>>  Interim History / Subjective:  Pt c/o headache and bilateral lower extremity pain/tremors    Objective   Blood pressure (!) 148/53, pulse 60, temperature 98 F (36.7 C), resp. rate 19, height '5\' 2"'$  (1.575 m), weight 58.6 kg, SpO2 98 %.        Intake/Output Summary (Last 24 hours) at 06/15/2021 1332 Last data filed at 06/15/2021 1200 Gross per 24 hour  Intake 1460.23 ml  Output 4400 ml  Net -2939.77 ml    Filed Weights   06/14/21 1426 06/15/21 0330  Weight: 64.2 kg 58.6 kg    Examination: General: acutely ill appearing male, resting in bed NAD  HENT: supple, no JVD Lungs: clear throughout, even, non labored  Cardiovascular: sinus tachycardia, no R/G, 2+ radial/1+ distal pulses, 2+ bilateral ankle swelling  Abdomen: +BS x4, soft, non tender, non distended  Extremities: moves all extremities, normal tone  Neuro: alert and oriented, follows commands, PERRLA GU: voiding clear yellow urine   Resolved Hospital Problem list     Assessment & Plan:  Acute respiratory failure secondary to metabolic acidosis  - Supplemental O2 for dyspnea and/or hypoxia  - Prn bronchodilator therapy   Hemorrhagic shock secondary to acute GI bleed Abdominal aortic aneurysm~CTA 06/04/21 revealed increased aneurysm from 4.1 cm to 4.4 cm since 12/11/2020 exam  Hx: HTN, Chronic combined systolic diastolic CHF, and Stroke  - Continuous telemetry monitoring  - Hold outpatient antihypertensives - Gentle fluid resuscitation or prn levophed gtt to maintain map >65 - US Aorta Duplex Limited to assess abdominal aortic aneurysm   Hyponatremia likely due to hypovolemia  Acute on chronic renal failure with hyperkalemia and metabolic acidosis: K+ 123XX123 -  Trend BMP and VBG  - Replace electrolytes as indicated  - Monitor UOP - Avoid nephrotoxic medications  - Nephrology consulted appreciate input  Acute on chronic anemia due to acute GI bleed - Trend CBC  - Monitor for s/sx of bleeding  - Transfuse for hgb <7 and/or active bleeding  - Hold outpatient aspirin and plavix  - Continue protonix gtt  for now  - Keep NPO for now  - Gastroenterology consulted appreciate input~will likely proceed with upper endoscopy in the am 09/10 once hyperkalemia resolved  Acute metabolic encephalopathy  Falls  - CT Head and Spine pending - Avoid sedating medications when able  - Frequent reorientation  - Maintain sleep/wake cycle   Best Practice (right click and "Reselect all SmartList Selections" daily)   Diet/type: NPO w/ oral meds DVT prophylaxis: SCD GI prophylaxis: PPI Lines: N/A Foley:  No and not indicated  Code Status:  full code Last date of multidisciplinary goals of care discussion [N/A]  Labs   CBC: Recent Labs  Lab 06/14/21 0915 06/14/21 1519 06/15/21 0029 06/15/21 0513 06/15/21 1105  WBC 7.6 8.8  --  8.0  --   NEUTROABS  --  7.9*  --   --   --   HGB 7.1* 6.9* 9.7* 9.5* 9.4*  HCT 20.9* 19.8* 27.3* 26.4* 26.9*  MCV 88.2 86.8  --  82.2  --   PLT 132* 110*  --  113*  --      Basic Metabolic Panel: Recent Labs  Lab 06/14/21 0915 06/14/21 1155 06/15/21 0029 06/15/21 0513  NA 120* 125*  --  132*  K 6.9* 5.2* 5.4* 5.0  CL 93* 99  --  105  CO2 17* 18*  --  24  GLUCOSE 86 73  --  100*  BUN 108* 105*  --  99*  CREATININE 2.15* 2.23*  --  2.12*  CALCIUM 8.0* 7.6*  --  8.8*  MG  --  2.2  --  2.5*  PHOS  --  4.3  --  5.0*    GFR: Estimated Creatinine Clearance: 19.7 mL/min (A) (by C-G formula based on SCr of 2.12 mg/dL (H)). Recent Labs  Lab 06/14/21 0915 06/14/21 1519 06/15/21 0513  WBC 7.6 8.8 8.0     Liver Function Tests: Recent Labs  Lab 06/14/21 1155  AST 36  ALT 42  ALKPHOS 95  BILITOT 0.7  PROT 4.8*  ALBUMIN 2.4*   No results for input(s): LIPASE, AMYLASE in the last 168 hours. No results for input(s): AMMONIA in the last 168 hours.  ABG    Component Value Date/Time   HCO3 18.9 (L) 06/14/2021 1155   ACIDBASEDEF 7.7 (H) 06/14/2021 1155   O2SAT 53.9 06/14/2021 1155      Coagulation Profile: Recent Labs  Lab 06/14/21 1155  06/15/21 0513  INR 1.5* 1.3*    Cardiac Enzymes: No results for input(s): CKTOTAL, CKMB, CKMBINDEX, TROPONINI in the last 168 hours.  HbA1C: Hgb A1c MFr Bld  Date/Time Value Ref Range Status  06/04/2021 04:59 PM 5.6 4.8 - 5.6 % Final    Comment:    (NOTE)         Prediabetes: 5.7 - 6.4         Diabetes: >6.4         Glycemic control for adults with diabetes: <7.0     CBG: Recent Labs  Lab 06/14/21 2315 06/15/21 0323 06/15/21 0415 06/15/21 0731 06/15/21 1103  GLUCAP 79 63* 114* 82 99  Review of Systems: Positives in BOLD   Gen: falls, fever, chills, weight change, fatigue, night sweats HEENT: Denies blurred vision, double vision, hearing loss, tinnitus, sinus congestion, rhinorrhea, sore throat, neck stiffness, dysphagia PULM: Denies shortness of breath, cough, sputum production, hemoptysis, wheezing CV: Denies chest pain, edema, orthopnea, paroxysmal nocturnal dyspnea, palpitations GI: Denies abdominal pain, nausea, vomiting, diarrhea, hematochezia, melena, constipation, change in bowel habits GU: Denies dysuria, hematuria, polyuria, oliguria, urethral discharge Endocrine: Denies hot or cold intolerance, polyuria, polyphagia or appetite change Derm: Denies rash, dry skin, scaling or peeling skin change Heme: Denies easy bruising, bleeding, bleeding gums Neuro: syncope, headache, numbness, weakness, slurred speech, loss of memory or consciousness  Past Medical History:  He,  has a past medical history of CHF (congestive heart failure) ([place]), Chronic kidney disease, Hypertension, Hyponatremia, and Stroke ([place]).   Surgical History:   Past Surgical History:  Procedure Laterality Date   BRAIN SURGERY     CARDIAC SURGERY       Social History:   reports that he has never smoked. He has never used smokeless tobacco. He reports that he does not drink alcohol and does not use drugs.   Family History:  His family history includes Cancer in his sister; Heart disease  in his sister.   Allergies Allergies  Allergen Reactions   Hydralazine     Pt has severe dizziness with it, and does not want to take it.     Home Medications  Prior to Admission medications   Medication Sig Start Date End Date Taking? Authorizing Provider  amLODipine (NORVASC) 10 MG tablet Take 1 tablet (10 mg total) by mouth daily. 06/07/21   Jennye Boroughs, MD  aspirin EC 81 MG EC tablet Take 1 tablet (81 mg total) by mouth daily. Swallow whole. 03/14/21   Danford, Suann Larry, MD  clopidogrel (PLAVIX) 75 MG tablet Take 75 mg by mouth daily.    [provider]  feeding supplement (ENSURE ENLIVE / ENSURE PLUS) LIQD Take 237 mLs by mouth 2 (two) times daily between meals. 04/14/21   Enzo Bi, MD  furosemide (LASIX) 40 MG tablet Take 1 tablet (40 mg total) by mouth daily. 04/14/21 07/13/21  Enzo Bi, MD  metoprolol succinate (TOPROL-XL) 50 MG 24 hr tablet Take 0.5 tablets (25 mg total) by mouth daily. Take with or immediately following a meal. 06/06/21   Jennye Boroughs, MD  olmesartan (BENICAR) 20 MG tablet Take 20 mg by mouth daily.    [provider]  simvastatin (ZOCOR) 80 MG tablet Take 80 mg by mouth daily.    [provider]    Critical care provider statement:   Total critical care time: 33 minutes   Performed by: Lanney Gins MD   Critical care time was exclusive of separately billable procedures and treating other patients.   Critical care was necessary to treat or prevent imminent or life-threatening deterioration.   Critical care was time spent personally by me on the following activities: development of treatment plan with patient and/or surrogate as well as nursing, discussions with consultants, evaluation of patient's response to treatment, examination of patient, obtaining history from patient or surrogate, ordering and performing treatments and interventions, ordering and review of laboratory studies, ordering and review of radiographic studies, pulse  oximetry and re-evaluation of patient's condition.    Ottie Glazier, M.D.  Pulmonary & Critical Care Medicine

## 2021-06-15 NOTE — Progress Notes (Signed)
PHARMACY CONSULT NOTE - FOLLOW UP  Pharmacy Consult for Electrolyte Monitoring and Replacement   Recent Labs: Potassium (mmol/L)  Date Value  06/15/2021 5.0   Magnesium (mg/dL)  Date Value  06/15/2021 2.5 (H)   Calcium (mg/dL)  Date Value  06/15/2021 8.8 (L)   Albumin (g/dL)  Date Value  06/14/2021 2.4 (L)   Phosphorus (mg/dL)  Date Value  06/15/2021 5.0 (H)   Sodium (mmol/L)  Date Value  06/15/2021 132 (L)   Corrected Calcium: 8.56  Assessment: Patient is an 87 y/o M with medical history including systolic HF with an EF of 25%, HTN, HLD, CVA, CKD, CAD s/p CABG who was recently admitted for hyponatremia, CHF exacerbation, and bradycardia who is now presenting with syncope. Pharmacy consulted to assist with electrolyte monitoring and replacement as indicated.  Labs notable for acute hyponatremia, hyperkalemia, non-anion gap metabolic acidosis, Scr 0000000 (BL~1.5) and Hgb 7.1 (BL 10)  Goal of Therapy:  WNL  Plan:  Na 132  K 5.0  Mag 2.5  Phos 5.0  Scr 2.12 No replacement at this time Pharmacy will continue to follow along   Noralee Space, PharmD 06/15/2021 11:31 AM

## 2021-06-15 NOTE — Anesthesia Preprocedure Evaluation (Addendum)
Anesthesia Evaluation  Patient identified by MRN, date of birth, ID band  Reviewed: Allergy & Precautions, NPO status , Patient's Chart, lab work & pertinent test results, reviewed documented beta blocker date and time   Airway Mallampati: III  TM Distance: >3 FB Neck ROM: Full    Dental  (+) Edentulous Upper, Edentulous Lower   Pulmonary    Pulmonary exam normal        Cardiovascular Exercise Tolerance: Poor hypertension, Pt. on medications + CAD, + Cardiac Stents and +CHF (Combined systolic, diastolic CHF; EF [id number]; grade 2 dd)  Normal cardiovascular exam+ dysrhythmias (Bradycardia)  - Peripheral Edema AAA now most recently measuring 4.4 cm  Admission after syncopal episode  Hemorrhagic shock improving   Neuro/Psych CVA, No Residual Symptoms negative psych ROS   GI/Hepatic GERD  ,GIB   Endo/Other    Renal/GU CRFRenal disease (AKI on CKD)     Musculoskeletal   Abdominal Normal abdominal exam  (+)   Peds  Hematology  (+) Blood dyscrasia (Acute Blood loss anemia s/p transfusion), anemia ,   Anesthesia Other Findings Hyperkalemia Hyponatremia Thrombocytopenia Coaguloapthy- inr 1.5   Reproductive/Obstetrics                            Anesthesia Physical Anesthesia Plan  ASA: 4  Anesthesia Plan: General   Post-op Pain Management:    Induction: Intravenous  PONV Risk Score and Plan: Treatment may vary due to age or medical condition, TIVA and Propofol infusion  Airway Management Planned: Natural Airway and Nasal Cannula  Additional Equipment:   Intra-op Plan:   Post-operative Plan:   Informed Consent: I have reviewed the patients History and Physical, chart, labs and discussed the procedure including the risks, benefits and alternatives for the proposed anesthesia with the patient or authorized representative who has indicated his/her understanding and acceptance.     Dental  Advisory Given  Plan Discussed with: Anesthesiologist, CRNA and Surgeon  Anesthesia Plan Comments: (Patient consented for risks of anesthesia including but not limited to:  - adverse reactions to medications - risk of airway placement if required - damage to eyes, teeth, lips or other oral mucosa - nerve damage due to positioning  - sore throat or hoarseness - Damage to heart, brain, nerves, lungs, other parts of body or loss of life  Patient voiced understanding.)        Anesthesia Quick Evaluation

## 2021-06-15 NOTE — Progress Notes (Signed)
[facility]  ROUNDING NOTE   Subjective:  Patient was seen in the ICU. Patient offers no new specific physical complaints. Patient was seen with the help of a interpreter as patient does not speak much [place]. Patient offers no complaint of chest pain shortness of breath nausea vomiting or diarrhea. Patient is anesthesia was in the room at the time of examination I did discuss the diagnosis of CKD with patient's team  Objective:  Vital signs in last 24 hours:  Temp:  [97.5 F (36.4 C)-98.9 F (37.2 C)] 97.8 F (36.6 C) (09/10 0700) Pulse Rate:  [41-75] 65 (09/10 0700) Resp:  [13-26] 16 (09/10 0700) BP: (88-168)/(31-92) 147/52 (09/10 0700) SpO2:  [60 %-100 %] 100 % (09/10 0700) Weight:  [58.6 kg-64.2 kg] 58.6 kg (09/10 0330)  Weight change:  Filed Weights   06/14/21 1426 06/15/21 0330  Weight: 64.2 kg 58.6 kg    Intake/Output: I/O last 3 completed shifts: In: 2459.2 [I.V.:516.9; Blood:906; IV Piggyback:1036.3] Out: 3900 [Urine:3900]   Intake/Output this shift:  No intake/output data recorded.  Physical Exam: General: No acute distress  Head: Normocephalic, atraumatic. Moist oral mucosal membranes  Eyes: Anicteric  Neck: Supple  Lungs:  Clear to auscultation, normal effort  Heart: S1S2 no rubs  Abdomen:  Soft, nontender, bowel sounds present  Extremities: No peripheral edema.  Neurologic: Awake, alert, following commands  Skin: No acute rash  Access: No hemodialysis access    Basic Metabolic Panel: Recent Labs  Lab 06/14/21 0915 06/14/21 1155 06/15/21 0029 06/15/21 0513  NA 120* 125*  --  132*  K 6.9* 5.2* 5.4* 5.0  CL 93* 99  --  105  CO2 17* 18*  --  24  GLUCOSE 86 73  --  100*  BUN 108* 105*  --  99*  CREATININE 2.15* 2.23*  --  2.12*  CALCIUM 8.0* 7.6*  --  8.8*  MG  --  2.2  --  2.5*  PHOS  --  4.3  --  5.0*    Liver Function Tests: Recent Labs  Lab 06/14/21 1155  AST 36  ALT 42  ALKPHOS 95  BILITOT 0.7  PROT 4.8*   ALBUMIN 2.4*   No results for input(s): LIPASE, AMYLASE in the last 168 hours. No results for input(s): AMMONIA in the last 168 hours.  CBC: Recent Labs  Lab 06/14/21 0915 06/14/21 1519 06/15/21 0029 06/15/21 0513  WBC 7.6 8.8  --  8.0  NEUTROABS  --  7.9*  --   --   HGB 7.1* 6.9* 9.7* 9.5*  HCT 20.9* 19.8* 27.3* 26.4*  MCV 88.2 86.8  --  82.2  PLT 132* 110*  --  113*    Cardiac Enzymes: No results for input(s): CKTOTAL, CKMB, CKMBINDEX, TROPONINI in the last 168 hours.  BNP: Invalid input(s): POCBNP  CBG: Recent Labs  Lab 06/14/21 1933 06/14/21 2315 06/15/21 0323 06/15/21 0415 06/15/21 0731  GLUCAP 84 79 63* 114* 82    Microbiology: Results for orders placed or performed during the hospital encounter of 06/14/21  Resp Panel by RT-PCR (Flu A&B, Covid) Nasopharyngeal Swab     Status: None   Collection Time: 06/14/21  9:38 AM   Specimen: Nasopharyngeal Swab; Nasopharyngeal(NP) swabs in vial transport medium  Result Value Ref Range Status   SARS Coronavirus 2 by RT PCR NEGATIVE NEGATIVE Final    Comment: (NOTE) SARS-CoV-2 target nucleic acids are NOT DETECTED.  The SARS-CoV-2 RNA is generally detectable in upper respiratory specimens during the  acute phase of infection. The lowest concentration of SARS-CoV-2 viral copies this assay can detect is 138 copies/mL. A negative result does not preclude SARS-Cov-2 infection and should not be used as the sole basis for treatment or other patient management decisions. A negative result may occur with  improper specimen collection/handling, submission of specimen other than nasopharyngeal swab, presence of viral mutation(s) within the areas targeted by this assay, and inadequate number of viral copies(<138 copies/mL). A negative result must be combined with clinical observations, patient history, and epidemiological information. The expected result is Negative.  Fact Sheet for Patients:   EntrepreneurPulse.com.au  Fact Sheet for Healthcare Providers:  IncredibleEmployment.be  This test is no t yet approved or cleared by the [place] FDA and  has been authorized for detection and/or diagnosis of SARS-CoV-2 by FDA under an Emergency Use Authorization (EUA). This EUA will remain  in effect (meaning this test can be used) for the duration of the COVID-19 declaration under Section 564(b)(1) of the Act, 21 U.S.C.section 360bbb-3(b)(1), unless the authorization is terminated  or revoked sooner.       Influenza A by PCR NEGATIVE NEGATIVE Final   Influenza B by PCR NEGATIVE NEGATIVE Final    Comment: (NOTE) The Xpert Xpress SARS-CoV-2/FLU/RSV plus assay is intended as an aid in the diagnosis of influenza from Nasopharyngeal swab specimens and should not be used as a sole basis for treatment. Nasal washings and aspirates are unacceptable for Xpert Xpress SARS-CoV-2/FLU/RSV testing.  Fact Sheet for Patients: EntrepreneurPulse.com.au  Fact Sheet for Healthcare Providers: IncredibleEmployment.be  This test is not yet approved or cleared by the [place] FDA and has been authorized for detection and/or diagnosis of SARS-CoV-2 by FDA under an Emergency Use Authorization (EUA). This EUA will remain in effect (meaning this test can be used) for the duration of the COVID-19 declaration under Section 564(b)(1) of the Act, 21 U.S.C. section 360bbb-3(b)(1), unless the authorization is terminated or revoked.  Performed at [facility], [address] [place], [place] [address]   MRSA Next Gen by PCR, Nasal     Status: None   Collection Time: 06/14/21  3:20 PM   Specimen: Nasal Mucosa; Nasal Swab  Result Value Ref Range Status   MRSA by PCR Next Gen NOT DETECTED NOT DETECTED Final    Comment: (NOTE) The GeneXpert MRSA Assay (FDA approved for NASAL specimens only), is one component of a  comprehensive MRSA colonization surveillance program. It is not intended to diagnose MRSA infection nor to guide or monitor treatment for MRSA infections. Test performance is not FDA approved in patients less than 12 years old. Performed at [facility], [address] [place],  [address]     Coagulation Studies: Recent Labs    06/14/21 1155 06/15/21 0513  LABPROT 18.2* 15.9*  INR 1.5* 1.3*    Urinalysis: Recent Labs    06/14/21 [place] <1.005*  PHURINE 5.5  GLUCOSEU NEGATIVE  HGBUR NEGATIVE  BILIRUBINUR NEGATIVE  KETONESUR NEGATIVE  PROTEINUR NEGATIVE  NITRITE NEGATIVE  LEUKOCYTESUR NEGATIVE      Imaging: CT HEAD WO CONTRAST (5MM)  Result Date: 06/14/2021 CLINICAL DATA:  Trauma EXAM: CT HEAD WITHOUT CONTRAST TECHNIQUE: Contiguous axial images were obtained from the base of the skull through the vertex without intravenous contrast. COMPARISON:  CT head 05/16/2021, brain MRI 01/18/2008 FINDINGS: Brain: There is no evidence of acute intracranial hemorrhage, extra-axial fluid collection, or acute infarct. Global parenchymal volume loss is unchanged. Remote infarcts  in the bilateral cerebellar hemispheres and occipital lobes are unchanged. The ventricles are stable in size. There is no mass lesion. There is no midline shift. Vascular: There is calcification of the bilateral cavernous ICAs. Skull: Deformity of the frontal calvarium is unchanged since 2009. There is no evidence of acute fracture. There is no suspicious osseous lesion. Sinuses/Orbits: The paranasal sinuses are clear. The globes and orbits are unremarkable. Other: None. IMPRESSION: 1. No acute intracranial hemorrhage or calvarial fracture. 2. Stable remote infarcts as above. Electronically Signed   By: Valetta Mole M.D.   On: 06/14/2021 11:45   US AORTA DUPLEX LIMITED  Result Date: 06/14/2021 CLINICAL DATA:  Abdominal aortic aneurysm EXAM: ULTRASOUND OF ABDOMINAL AORTA  TECHNIQUE: Ultrasound examination of the abdominal aorta and proximal common iliac arteries was performed to evaluate for aneurysm. Additional color and Doppler images of the distal aorta were obtained to document patency. COMPARISON:  06/04/2021 FINDINGS: Abdominal aortic measurements as follows: Proximal:  2.3 x 2.3 cm Mid:  2.3 x 2.7 cm Distal:  4.3 x 4.7 cm, significant atheromatous plaque Patent: Yes, peak systolic velocity is 0000000 cm/s Right common iliac artery: Not visualized due to bowel gas. Left common iliac artery: Not visualized due to bowel gas. IMPRESSION: 1. Infrarenal abdominal aortic aneurysm measuring 4.3 x 4.7 cm. Recommend follow-up CT/MR every 6 months and vascular consultation. This recommendation follows ACR consensus guidelines: White Paper of the ACR Incidental Findings Committee II on Vascular Findings. J Am Coll Radiol 2013; 10:789-794. Electronically Signed   By: Randa Ngo M.D.   On: 06/14/2021 19:38   CT C SPINE LTD WO OR W CONTRAST  Result Date: 06/14/2021 CLINICAL DATA:  Fall EXAM: CT CERVICAL SPINE WITHOUT CONTRAST TECHNIQUE: Multidetector CT imaging of the cervical spine was performed without intravenous contrast. Multiplanar CT image reconstructions were also generated. COMPARISON:  CT cervical spine 05/16/2021 FINDINGS: Image quality is degraded by motion artifact. Additionally, the cervical spine is only image through the C6 vertebral body. Alignment: There is trace anterolisthesis of C4 on C5, unchanged. There is slight focal kyphosis centered at C6. There is no traumatic malalignment to the level imaged. There is no jumped or perched facet. Skull base and vertebrae: Skull base alignment is within normal limits. Vertebral body heights are preserved. There is no evidence of acute fracture. Soft tissues and spinal canal: No prevertebral fluid or swelling. No visible canal hematoma. Disc levels: There is bulky uncovertebral arthropathy at C5-C6. There is multilevel neural  foraminal stenosis, unchanged. The osseous spinal canal is patent. Upper chest: Not imaged Other: None. IMPRESSION: 1. Image quality is degraded by motion artifact, and the cervical spine is only imaged through the C6 vertebral body. 2. Within these confines, no acute fracture or traumatic malalignment of the level imaged. Electronically Signed   By: Valetta Mole M.D.   On: 06/14/2021 12:33   DG Chest Portable 1 View  Result Date: 06/14/2021 CLINICAL DATA:  falls, confused. eval infiltrate,p tx EXAM: PORTABLE CHEST 1 VIEW COMPARISON:  Radiograph 06/03/2021, chest CT 06/05/2019 FINDINGS: Unchanged cardiomediastinal silhouette with prior median sternotomy and postsurgical changes of CABG. There are diffuse mild interstitial opacities. There is a small right pleural effusion. No visible pneumothorax. There is no acute osseous abnormality on single frontal view of the chest. IMPRESSION: Mild pulmonary edema and small right pleural effusion. Electronically Signed   By: Maurine Simmering M.D.   On: 06/14/2021 10:24     Medications:    sodium chloride  sodium chloride     sodium chloride Stopped (06/14/21 2323)   lactated ringers Stopped (06/14/21 1925)   norepinephrine (LEVOPHED) Adult infusion Stopped (06/14/21 1414)   pantoprazole 8 mg/hr (06/15/21 0600)    sodium chloride   Intravenous Once   Chlorhexidine Gluconate Cloth  6 each Topical Daily   ondansetron (ZOFRAN) IV  4 mg Intravenous Once   [START ON 06/18/2021] pantoprazole  40 mg Intravenous Q12H   acetaminophen, ipratropium-albuterol, ondansetron (ZOFRAN) IV  Assessment/ Plan:  87 y.o. male With past medical history of hypertension, coronary disease, chronic systolic heart failure, chronic kidney disease stage IIIb, anemia of chronic kidney disease, proteinuria who presents now with acute GI bleeding.       1)Renal   Acute kidney injury Patient has AKI secondary to ATN Patient has AKI on CKD Patient has baseline CKD stage  IIIb Patient has CKD going back to 2019 Patient CKD has been marked with multiple episodes of AKI Patient is CKD most likely secondary to long-term history of hypertension Patient creatinine is now stable at around 2.1  2) hypotension Patient was earlier requiring vasopressors Patient is now stable  3)Anemia of chronic disease and GI bleed  CBC Latest Ref Rng & Units 06/15/2021 06/15/2021 06/14/2021  WBC 4.0 - 10.5 K/uL 8.0 - 8.8  Hemoglobin 13.0 - 17.0 g/dL 9.5(L) 9.7(L) 6.9(L)  Hematocrit 39.0 - 52.0 % 26.4(L) 27.3(L) 19.8(L)  Platelets 150 - 400 K/uL 113(L) - 110(L)       HGb is at goal (9--11) Patient hemoglobin was less than 7 earlier Patient has received PRBC  4) Secondary hyperparathyroidism -CKD Mineral-Bone Disorder    Lab Results  Component Value Date   CALCIUM 8.8 (L) 06/15/2021   PHOS 5.0 (H) 06/15/2021    Secondary Hyperparathyroidism present Patient intact PTH level was high at 125 with an outpatient Phosphorus at goal for the acute state   5) GI bleed Primary team and GI is following  6) Electrolytes   BMP Latest Ref Rng & Units 06/15/2021 06/15/2021 06/14/2021  Glucose 70 - 99 mg/dL 100(H) - 73  BUN 8 - 23 mg/dL 99(H) - 105(H)  Creatinine 0.61 - 1.24 mg/dL 2.12(H) - 2.23(H)  Sodium 135 - 145 mmol/L 132(L) - 125(L)  Potassium 3.5 - 5.1 mmol/L 5.0 5.4(H) 5.2(H)  Chloride 98 - 111 mmol/L 105 - 99  CO2 22 - 32 mmol/L 24 - 18(L)  Calcium 8.9 - 10.3 mg/dL 8.8(L) - 7.6(L)     Sodium Hypovolemic hyponatremia Patient sodium is now better   Potassium Hyperkalemia Now better    7)Acid base  Patient was acidotic Co2 is now at goal    Plan   GFR is stable Potassium is better Hemoglobin stable Sodium is better Acidosis better We will continue the current treatment   LOS: 1 Aundrea Higginbotham s [facility] 9/10/20228:38 AM

## 2021-06-15 NOTE — Transfer of Care (Signed)
Immediate Anesthesia Transfer of Care Note  Patient: Daniel Bolton  Procedure(s) Performed: ESOPHAGOGASTRODUODENOSCOPY (EGD)  Patient Location: PACU and ICU  Anesthesia Type:General  Level of Consciousness: drowsy and patient cooperative  Airway & Oxygen Therapy: Patient Spontanous Breathing and Patient connected to nasal cannula oxygen  Post-op Assessment: Report given to RN and Post -op Vital signs reviewed and stable  Post vital signs: Reviewed and stable  Last Vitals:  Vitals Value Taken Time  BP    Temp    Pulse    Resp    SpO2      Last Pain:  Vitals:   06/15/21 0700  TempSrc:   PainSc: 0-No pain         Complications: No notable events documented.

## 2021-06-15 NOTE — Anesthesia Postprocedure Evaluation (Signed)
Anesthesia Post Note  Patient: Daniel Bolton  Procedure(s) Performed: ESOPHAGOGASTRODUODENOSCOPY (EGD)  Patient location during evaluation: ICU Anesthesia Type: General Level of consciousness: awake and alert Pain management: pain level controlled Vital Signs Assessment: post-procedure vital signs reviewed and stable Respiratory status: spontaneous breathing, nonlabored ventilation, respiratory function stable and patient connected to nasal cannula oxygen Cardiovascular status: blood pressure returned to baseline and stable Postop Assessment: no apparent nausea or vomiting Anesthetic complications: no   No notable events documented.   Last Vitals:  Vitals:   06/15/21 1500 06/15/21 1530  BP: (!) 129/44 (!) 144/59  Pulse: (!) 58 65  Resp: 14 15  Temp:  36.4 C  SpO2: 97% 91%    Last Pain:  Vitals:   06/15/21 1530  TempSrc:   PainSc: 0-No pain                 [place] Ouch

## 2021-06-15 NOTE — Progress Notes (Signed)
Patient's daughters and patient updated at bedside on procedures and findings via medical interpreter. Granddaughter was also updated by phone post procedure of findings and interventions. All verbalized understanding Continue iv protonix 40 mg bid  Clear liquids today. Can potentially advance diet tomorrow Await pathology. Monitor h&h; transfusion and resuscitation as per primary team GI will follow.  Annamaria Helling, DO [facility]- Gastroenterology

## 2021-06-15 NOTE — Progress Notes (Signed)
[facility] Inpatient Follow-up/Progress Note   Patient ID: Daniel Bolton is a 87 y.o. male.  Overnight Events / Subjective Findings Information garnered via nursing and translator Multiple bowel movements overnight with clots/blood.  He was briefly on Levophed but has not required for most of the night.  He did receive 3 units of PRBCs in total with appropriate rise in hemoglobin.  The patient denies any nausea abdominal pain or vomiting.  No other acute GI complaints  Review of Systems  Constitutional:        Garnered through interpreter  Gastrointestinal:  Positive for blood in stool (clots per nursing) and diarrhea. Negative for abdominal pain, constipation, nausea and vomiting.  Psychiatric/Behavioral:  Negative for confusion.   All other systems reviewed and are negative.   Medications  Current Facility-Administered Medications:    0.9 %  sodium chloride infusion (Manually program via Guardrails IV Fluids), , Intravenous, Once, Graves, Dana E, NP   0.9 %  sodium chloride infusion, 10 mL/hr, Intravenous, Once, Vladimir Crofts, MD   0.9 %  sodium chloride infusion, 250 mL, Intravenous, Continuous, Graves, Dana E, NP   0.9 %  sodium chloride infusion, , Intravenous, Continuous, Annamaria Helling, DO, Stopped at 06/14/21 2323   acetaminophen (TYLENOL) tablet 650 mg, 650 mg, Oral, Q6H PRN, Graves, Raeford Razor, NP, 650 mg at 06/14/21 1519   Chlorhexidine Gluconate Cloth 2 % PADS 6 each, 6 each, Topical, Daily, Aleskerov, Fuad, MD   ipratropium-albuterol (DUONEB) 0.5-2.5 (3) MG/3ML nebulizer solution 3 mL, 3 mL, Nebulization, Q6H PRN, Graves, Raeford Razor, NP   lactated ringers infusion, , Intravenous, Continuous, Graves, Raeford Razor, NP, Held at 06/14/21 1925   norepinephrine (LEVOPHED) '4mg'$  in 266m premix infusion, 2-10 mcg/min, Intravenous, Titrated, Graves, Dana E, NP, Stopped at 06/14/21 1414   ondansetron (ZOFRAN) injection 4 mg, 4 mg, Intravenous, Q6H PRN, Graves, Dana E, NP,  4 mg at 06/14/21 1149   ondansetron (ZOFRAN) injection 4 mg, 4 mg, Intravenous, Once, SVladimir Crofts MD   [Derrill MemoON 06/18/2021] pantoprazole (PROTONIX) injection 40 mg, 40 mg, Intravenous, Q12H, SVladimir Crofts MD   pantoprozole (PROTONIX) 80 mg /NS 100 mL infusion, 8 mg/hr, Intravenous, Continuous, SVladimir Crofts MD, Last Rate: 10 mL/hr at 06/15/21 0600, 8 mg/hr at 06/15/21 0600  sodium chloride     sodium chloride     sodium chloride Stopped (06/14/21 2323)   lactated ringers Stopped (06/14/21 1925)   norepinephrine (LEVOPHED) Adult infusion Stopped (06/14/21 1414)   pantoprazole 8 mg/hr (06/15/21 0600)    acetaminophen, ipratropium-albuterol, ondansetron (ZOFRAN) IV   Objective    Vitals:   06/15/21 0500 06/15/21 0600 06/15/21 0700 06/15/21 0800  BP: (!) 145/44 (!) 156/48 (!) 147/52 (!) 136/50  Pulse: 62 67 65 65  Resp: 14 (!) '25 16 15  '$ Temp:   97.8 F (36.6 C)   TempSrc:      SpO2: 100% 100% 100% 100%  Weight:      Height:         Physical Exam Vitals and nursing note reviewed.  Constitutional:      General: He is not in acute distress.    Appearance: He is ill-appearing. He is not toxic-appearing or diaphoretic.     Comments: Frail appearing  HENT:     Head: Normocephalic and atraumatic.     Nose: Nose normal.     Mouth/Throat:     Mouth: Mucous membranes are moist.     Pharynx: Oropharynx is clear.  Eyes:  General: No scleral icterus.    Extraocular Movements: Extraocular movements intact.  Cardiovascular:     Rate and Rhythm: Normal rate and regular rhythm.     Heart sounds: Normal heart sounds. No murmur heard.   No friction rub. No gallop.  Pulmonary:     Effort: Pulmonary effort is normal. No respiratory distress.     Breath sounds: Normal breath sounds. No wheezing, rhonchi or rales.  Abdominal:     General: Abdomen is flat. Bowel sounds are normal. There is no distension.     Palpations: Abdomen is soft.     Tenderness: There is no abdominal  tenderness. There is no guarding or rebound.  Musculoskeletal:     Cervical back: Neck supple.     Right lower leg: No edema.     Left lower leg: No edema.  Skin:    General: Skin is warm and dry.     Coloration: Skin is not jaundiced or pale.  Neurological:     General: No focal deficit present.     Mental Status: He is alert and oriented to person, place, and time. Mental status is at baseline.  Psychiatric:        Mood and Affect: Mood normal.        Behavior: Behavior normal.        Thought Content: Thought content normal.        Judgment: Judgment normal.     Laboratory Data Recent Labs  Lab 06/14/21 0915 06/14/21 1519 06/15/21 0029 06/15/21 0513  WBC 7.6 8.8  --  8.0  HGB 7.1* 6.9* 9.7* 9.5*  HCT 20.9* 19.8* 27.3* 26.4*  PLT 132* 110*  --  113*  NEUTOPHILPCT  --  89  --   --   LYMPHOPCT  --  5  --   --   MONOPCT  --  5  --   --   EOSPCT  --  0  --   --    Recent Labs  Lab 06/14/21 0915 06/14/21 1155 06/15/21 0029 06/15/21 0513  NA 120* 125*  --  132*  K 6.9* 5.2* 5.4* 5.0  CL 93* 99  --  105  CO2 17* 18*  --  24  BUN 108* 105*  --  99*  CREATININE 2.15* 2.23*  --  2.12*  CALCIUM 8.0* 7.6*  --  8.8*  PROT  --  4.8*  --   --   BILITOT  --  0.7  --   --   ALKPHOS  --  95  --   --   ALT  --  42  --   --   AST  --  36  --   --   GLUCOSE 86 73  --  100*   Recent Labs  Lab 06/14/21 1155 06/15/21 0513  INR 1.5* 1.3*      Imaging Studies: CT HEAD WO CONTRAST (5MM)  Result Date: 06/14/2021 CLINICAL DATA:  Trauma EXAM: CT HEAD WITHOUT CONTRAST TECHNIQUE: Contiguous axial images were obtained from the base of the skull through the vertex without intravenous contrast. COMPARISON:  CT head 05/16/2021, brain MRI 01/18/2008 FINDINGS: Brain: There is no evidence of acute intracranial hemorrhage, extra-axial fluid collection, or acute infarct. Global parenchymal volume loss is unchanged. 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 The ventricles are stable in size. There is no mass lesion. There is no midline shift. Vascular: There is calcification of the bilateral cavernous ICAs. Skull:  Deformity of the frontal calvarium is unchanged since 2009. There is no evidence of acute fracture. There is no suspicious osseous lesion. Sinuses/Orbits: The paranasal sinuses are clear. The globes and orbits are unremarkable. Other: None. IMPRESSION: 1. No acute intracranial hemorrhage or calvarial fracture. 2. Stable remote infarcts as above. Electronically Signed   By: Valetta Mole M.D.   On: 06/14/2021 11:45   US AORTA DUPLEX LIMITED  Result Date: 06/14/2021 CLINICAL DATA:  Abdominal aortic aneurysm EXAM: ULTRASOUND OF ABDOMINAL AORTA TECHNIQUE: Ultrasound examination of the abdominal aorta and proximal common iliac arteries was performed to evaluate for aneurysm. Additional color and Doppler images of the distal aorta were obtained to document patency. COMPARISON:  06/04/2021 FINDINGS: Abdominal aortic measurements as follows: Proximal:  2.3 x 2.3 cm Mid:  2.3 x 2.7 cm Distal:  4.3 x 4.7 cm, significant atheromatous plaque Patent: Yes, peak systolic velocity is 0000000 cm/s Right common iliac artery: Not visualized due to bowel gas. Left common iliac artery: Not visualized due to bowel gas. IMPRESSION: 1. Infrarenal abdominal aortic aneurysm measuring 4.3 x 4.7 cm. Recommend follow-up CT/MR every 6 months and vascular consultation. This recommendation follows ACR consensus guidelines: White Paper of the ACR Incidental Findings Committee II on Vascular Findings. J Am Coll Radiol 2013; 10:789-794. Electronically Signed   By: Randa Ngo M.D.   On: 06/14/2021 19:38   CT C SPINE LTD WO OR W CONTRAST  Result Date: 06/14/2021 CLINICAL DATA:  Fall EXAM: CT CERVICAL SPINE WITHOUT CONTRAST TECHNIQUE: Multidetector CT imaging of the cervical spine was performed without intravenous contrast. Multiplanar CT image reconstructions were also  generated. COMPARISON:  CT cervical spine 05/16/2021 FINDINGS: Image quality is degraded by motion artifact. Additionally, the cervical spine is only image through the C6 vertebral body. Alignment: There is trace anterolisthesis of C4 on C5, unchanged. There is slight focal kyphosis centered at C6. There is no traumatic malalignment to the level imaged. There is no jumped or perched facet. Skull base and vertebrae: Skull base alignment is within normal limits. Vertebral body heights are preserved. There is no evidence of acute fracture. Soft tissues and spinal canal: No prevertebral fluid or swelling. No visible canal hematoma. Disc levels: There is bulky uncovertebral arthropathy at C5-C6. There is multilevel neural foraminal stenosis, unchanged. The osseous spinal canal is patent. Upper chest: Not imaged Other: None. IMPRESSION: 1. Image quality is degraded by motion artifact, and the cervical spine is only imaged through the C6 vertebral body. 2. Within these confines, no acute fracture or traumatic malalignment of the level imaged. Electronically Signed   By: Valetta Mole M.D.   On: 06/14/2021 12:33   DG Chest Portable 1 View  Result Date: 06/14/2021 CLINICAL DATA:  falls, confused. eval infiltrate,p tx EXAM: PORTABLE CHEST 1 VIEW COMPARISON:  Radiograph 06/03/2021, chest CT 06/05/2019 FINDINGS: Unchanged cardiomediastinal silhouette with prior median sternotomy and postsurgical changes of CABG. There are diffuse mild interstitial opacities. There is a small right pleural effusion. No visible pneumothorax. There is no acute osseous abnormality on single frontal view of the chest. IMPRESSION: Mild pulmonary edema and small right pleural effusion. Electronically Signed   By: Maurine Simmering M.D.   On: 06/14/2021 10:24    Assessment:   # Suspected Upper GIB             - s/p 3 u prbc with appropriate improvement             - BUN/CR 108/2.15             -  initially presented hypotensive, improving with  resuscitation             - on asa plavix- taken on 06/13/21             - ddx includes ulcer, dieulafoy, avms                         - consider fistula given patient's AAA # Hemorrhagic shock- improving with resuscitation # hyperkalemia- resolved- now 5.0 # hyponatremia # thrombocytopenia # coaguloapthy- improving # AKI on CKD # Syncopal episode # bradycardia # Combined systolic, diastolic CHF; 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 NPO since midnight Consent garnered through translator Monitor H&H transfusion and resuscitation as per primary team Nursing has updated granddaughter Currently on ppi gtt CBC, BMP, INR reviewed this morning Electrolyte management as per primary team Further recommendations pending endoscopy  - if becomes hemodynamically unstable, consider CTA to rule out gastro or enteric fistula. Would also r/o retroperitoneal bleed given syncope although less likely given hematemesis and hematochezia. Would also help determining location of potential bleeding Continue to monitor for signs of GI bleeding Continue to hold aspirin and Plavix as well as other DVT prophylaxis   Esophagogastroduodenoscopy with possible biopsy, control of bleeding, polypectomy, and interventions as necessary has been discussed with the patient/patient representative. Informed consent was obtained from the patient/patient representative after explaining the indication, nature, and risks of the procedure including but not limited to death, bleeding, perforation, missed neoplasm/lesions, cardiorespiratory compromise, and reaction to medications. Opportunity for questions was given and appropriate answers were provided. Patient/patient representative has verbalized understanding is amenable to undergoing the procedure.  I personally performed the service.  Management of other medical comorbidities as per primary team  Thank you for allowing [place] to participate in  this patient's care. Please don't hesitate to call if any questions or concerns arise.   Annamaria Helling, DO [facility]  Portions of the record may have been created with voice recognition software. Occasional wrong-word or 'sound-a-like' substitutions may have occurred due to the inherent limitations of voice recognition software.  Read the chart carefully and recognize, using context, where substitutions may have occurred.

## 2021-06-15 NOTE — Interval H&P Note (Signed)
History and Physical Interval Note: Progress note from 06/15/21  was reviewed and there was no interval change after seeing and examining the patient.  Written consent was obtained from the patient via televideo medical interpreter after discussion of risks, benefits, and alternatives. Patient has consented to proceed with Esophagogastroduodenoscopy with possible intervention   06/15/2021 9:10 AM  Daniel Bolton  has presented today for surgery, with the diagnosis of GIB.  The various methods of treatment have been discussed with the patient and family. After consideration of risks, benefits and other options for treatment, the patient has consented to  Procedure(s): ESOPHAGOGASTRODUODENOSCOPY (EGD) (N/A) as a surgical intervention.  The patient's history has been reviewed, patient examined, no change in status, stable for surgery.  I have reviewed the patient's chart and labs.  Questions were answered to the patient's satisfaction.     Annamaria Helling

## 2021-06-15 NOTE — Op Note (Addendum)
[facility] Patient Name: [facility] Procedure Date: 06/15/2021 8:41 AM MRN: [id number] Account #: [id number] Date of Birth: 08/23/1934 Admit Type: Inpatient Age: 87 Room: [facility] ENDO ROOM 4 Gender: Male Note Status: Finalized Instrument Name: Upper Endoscope [id number] Procedure:             Upper GI endoscopy Indications:           Melena, Active gastrointestinal bleeding Providers:             Annamaria Helling DO, DO Medicines:             Monitored Anesthesia Care Complications:         No immediate complications. Estimated blood loss:                         Minimal. Procedure:             Pre-Anesthesia Assessment:                        - Prior to the procedure, a History and Physical was                         performed, and patient medications and allergies were                         reviewed. The patient is competent. The risks and                         benefits of the procedure and the sedation options and                         risks were discussed with the patient. All questions                         were answered and informed consent was obtained.                         Patient identification and proposed procedure were                         verified by the physician, the nurse, the anesthetist                         and the technician in the endoscopy suite. Mental                         Status Examination: alert and oriented. Airway                         Examination: normal oropharyngeal airway and neck                         mobility. Respiratory Examination: clear to                         auscultation. CV Examination: normal. Prophylactic                         Antibiotics: The patient does not require prophylactic  antibiotics. Prior Anticoagulants: The patient has                         taken no previous anticoagulant or antiplatelet                         agents. ASA Grade  Assessment: III - A patient with                         severe systemic disease. After reviewing the risks and                         benefits, the patient was deemed in satisfactory                         condition to undergo the procedure. The anesthesia                         plan was to use monitored anesthesia care (MAC).                         Immediately prior to administration of medications,                         the patient was re-assessed for adequacy to receive                         sedatives. The heart rate, respiratory rate, oxygen                         saturations, blood pressure, adequacy of pulmonary                         ventilation, and response to care were monitored                         throughout the procedure. The physical status of the                         patient was re-assessed after the procedure.                        After obtaining informed consent, the endoscope was                         passed under direct vision. Throughout the procedure,                         the patient's blood pressure, pulse, and oxygen                         saturations were monitored continuously. The Endoscope                         was introduced through the mouth, and advanced to the                         second part of duodenum. The upper GI endoscopy was  accomplished without difficulty. The patient tolerated                         the procedure well. Findings:      LA Grade C (one or more mucosal breaks continuous between tops of 2 or       more mucosal folds, less than 75% circumference) esophagitis with no       bleeding was found 34 to 40 cm from the incisors. Estimated blood loss:       none.      Two non-bleeding superficial gastric ulcers with no stigmata of bleeding       were found in the gastric antrum. The largest lesion was 4 mm in largest       dimension. Random gastric biopsies were taken with a cold forceps for        Helicobacter pylori testing. Estimated blood loss was minimal.      Multiple dispersed 1 to 2 mm erosions with no bleeding and no stigmata       of recent bleeding were found in the entire examined stomach. Estimated       blood loss: none.      Three non-bleeding superficial duodenal ulcers with a visible vessel       were found in the duodenal bulb. The largest lesion was 6 mm in largest       dimension and was Forrest Class 2A. Area was successfully injected with       4 mL of a 1:10,000 solution of epinephrine for hemostasis. Coagulation       for hemostasis using bipolar probe was successful. Estimated blood loss:       minimal. The others were forrest class 3      The exam of the duodenum was otherwise normal in the second portion. Impression:            - LA Grade C reflux esophagitis with no bleeding.                        - Non-bleeding gastric ulcers with no stigmata of                         bleeding. Biopsied.                        - Erosive gastropathy with no bleeding and no stigmata                         of recent bleeding.                        - Non-bleeding duodenal ulcers with a visible vessel.                         Injected. Treated with bipolar cautery. Recommendation:        - Return patient to ICU for ongoing care.                        - Clear liquid diet for 1 day.                        - Use Protonix (pantoprazole) 40 mg IV BID.                        -  No aspirin, ibuprofen, naproxen, or other                         non-steroidal anti-inflammatory drugs indefinitely if                         possible. Hold plavix for at least 48 hours,                         indefinitely if possible (cardiac stents >10 years ago                         per family)                        - Repeat upper endoscopy in 8 weeks to check healing.                        - Return to GI office at appointment to be scheduled.                        - Granddaughter updated  by phone of procedure findings                         and outcome Procedure Code(s):     --- Professional ---                        [phone], 59, Esophagogastroduodenoscopy, flexible,                         transoral; with control of bleeding, any method                        [id number], 51, Esophagogastroduodenoscopy, flexible,                         transoral; with biopsy, single or multiple Diagnosis Code(s):     --- Professional ---                        K21.00, Gastro-esophageal reflux disease with                         esophagitis, without bleeding                        K25.9, Gastric ulcer, unspecified as acute or chronic,                         without hemorrhage or perforation                        K31.89, Other diseases of stomach and duodenum                        K26.4, Chronic or unspecified duodenal ulcer with                         hemorrhage  K92.1, Melena (includes Hematochezia)                        K92.2, Gastrointestinal hemorrhage, unspecified CPT copyright 2019 American Medical Association. All rights reserved. The codes documented in this report are preliminary and upon coder review may  be revised to meet current compliance requirements. Attending Participation:      I personally performed the entire procedure. Volney American, DO Annamaria Helling DO, DO 06/15/2021 10:22:40 AM This report has been signed electronically. Number of Addenda: 0 Note Initiated On: 06/15/2021 8:41 AM Estimated Blood Loss:  Estimated blood loss was minimal.      [facility]

## 2021-06-15 NOTE — Progress Notes (Signed)
Patient blood sugar 63. 1/2 amp of D50 given per protocol. Recheck 114. NP made aware. Will continue to monitor.

## 2021-06-16 LAB — TYPE AND SCREEN
ABO/RH(D): O POS
Antibody Screen: NEGATIVE
Unit division: 0
Unit division: 0
Unit division: 0
Unit division: 0
Unit division: 0

## 2021-06-16 LAB — BPAM RBC
Blood Product Expiration Date: 202210122359
Blood Product Expiration Date: 202210122359
Blood Product Expiration Date: 202210122359
Blood Product Expiration Date: 202210122359
Blood Product Expiration Date: 202210122359
ISSUE DATE / TIME: 202209091132
ISSUE DATE / TIME: 202209091647
ISSUE DATE / TIME: 202209092019
Unit Type and Rh: 5100
Unit Type and Rh: 5100
Unit Type and Rh: 5100
Unit Type and Rh: 5100
Unit Type and Rh: 5100

## 2021-06-16 LAB — BASIC METABOLIC PANEL
Anion gap: 8 (ref 5–15)
BUN: 68 mg/dL — ABNORMAL HIGH (ref 8–23)
CO2: 22 mmol/L (ref 22–32)
Calcium: 8.7 mg/dL — ABNORMAL LOW (ref 8.9–10.3)
Chloride: 108 mmol/L (ref 98–111)
Creatinine, Ser: 1.71 mg/dL — ABNORMAL HIGH (ref 0.61–1.24)
GFR, Estimated: 39 mL/min — ABNORMAL LOW (ref >60)
Glucose, Bld: 157 mg/dL — ABNORMAL HIGH (ref 70–99)
Potassium: 4.2 mmol/L (ref 3.5–5.1)
Sodium: 138 mmol/L (ref 135–145)

## 2021-06-16 LAB — CBC
HCT: 27 % — ABNORMAL LOW (ref 39.0–52.0)
Hemoglobin: 9.2 g/dL — ABNORMAL LOW (ref 13.0–17.0)
MCH: 28.7 pg (ref 26.0–34.0)
MCHC: 34.1 g/dL (ref 30.0–36.0)
MCV: 84.1 fL (ref 80.0–100.0)
Platelets: 120 10*3/uL — ABNORMAL LOW (ref 150–400)
RBC: 3.21 MIL/uL — ABNORMAL LOW (ref 4.22–5.81)
RDW: 16.1 % — ABNORMAL HIGH (ref 11.5–15.5)
WBC: 5.3 10*3/uL (ref 4.0–10.5)
nRBC: 0 % (ref 0.0–0.2)

## 2021-06-16 LAB — GLUCOSE, CAPILLARY
Glucose-Capillary: 103 mg/dL — ABNORMAL HIGH (ref 70–99)
Glucose-Capillary: 108 mg/dL — ABNORMAL HIGH (ref 70–99)
Glucose-Capillary: 69 mg/dL — ABNORMAL LOW (ref 70–99)
Glucose-Capillary: 81 mg/dL (ref 70–99)
Glucose-Capillary: 88 mg/dL (ref 70–99)
Glucose-Capillary: 92 mg/dL (ref 70–99)
Glucose-Capillary: 97 mg/dL (ref 70–99)

## 2021-06-16 LAB — PREPARE RBC (CROSSMATCH)

## 2021-06-16 MED ORDER — PANTOPRAZOLE SODIUM 40 MG PO TBEC
40.0000 mg | DELAYED_RELEASE_TABLET | Freq: Two times a day (BID) | ORAL | Status: DC
  Administered 2021-06-16 – 2021-06-18 (×4): 40 mg via ORAL
  Filled 2021-06-16 (×4): qty 1

## 2021-06-16 NOTE — Progress Notes (Signed)
[facility]  ROUNDING NOTE   Subjective:  Patient was seen in the ICU. Patient offers no new specific physical complaints.   Objective:  Vital signs in last 24 hours:  Temp:  [97.6 F (36.4 C)-98.9 F (37.2 C)] 98.9 F (37.2 C) (09/11 0400) Pulse Rate:  [58-85] 67 (09/11 0600) Resp:  [13-27] 27 (09/11 0600) BP: (119-171)/(44-79) 134/48 (09/11 0600) SpO2:  [91 %-100 %] 99 % (09/11 0600) Weight:  [58.6 kg] 58.6 kg (09/11 0330)  Weight change: -5.6 kg Filed Weights   06/14/21 1426 06/15/21 0330 06/16/21 0330  Weight: 64.2 kg 58.6 kg 58.6 kg    Intake/Output: I/O last 3 completed shifts: In: 1087.2 [I.V.:471.2; Blood:616] Out: 6100 [id number]   Intake/Output this shift:  No intake/output data recorded.  Physical Exam: General: No acute distress  Head: Normocephalic, atraumatic. Moist oral mucosal membranes  Eyes: Anicteric  Neck: Supple  Lungs:  Clear to auscultation, normal effort  Heart: S1S2 no rubs  Abdomen:  Soft, nontender, bowel sounds present  Extremities: No peripheral edema.  Neurologic: Awake, alert, following commands  Skin: No acute rash  Access: No hemodialysis access    Basic Metabolic Panel: Recent Labs  Lab 06/14/21 0915 06/14/21 1155 06/15/21 0029 06/15/21 0513 06/16/21 0835  NA 120* 125*  --  132* 138  K 6.9* 5.2* 5.4* 5.0 4.2  CL 93* 99  --  105 108  CO2 17* 18*  --  24 22  GLUCOSE 86 73  --  100* 157*  BUN 108* 105*  --  99* 68*  CREATININE 2.15* 2.23*  --  2.12* 1.71*  CALCIUM 8.0* 7.6*  --  8.8* 8.7*  MG  --  2.2  --  2.5*  --   PHOS  --  4.3  --  5.0*  --     Liver Function Tests: Recent Labs  Lab 06/14/21 1155  AST 36  ALT 42  ALKPHOS 95  BILITOT 0.7  PROT 4.8*  ALBUMIN 2.4*   No results for input(s): LIPASE, AMYLASE in the last 168 hours. No results for input(s): AMMONIA in the last 168 hours.  CBC: Recent Labs  Lab 06/14/21 0915 06/14/21 1519 06/15/21 0029 06/15/21 0513 06/15/21 1105  06/15/21 1800 06/16/21 0653  WBC 7.6 8.8  --  8.0  --   --  5.3  NEUTROABS  --  7.9*  --   --   --   --   --   HGB 7.1* 6.9* 9.7* 9.5* 9.4* 9.5* 9.2*  HCT 20.9* 19.8* 27.3* 26.4* 26.9* 27.1* 27.0*  MCV 88.2 86.8  --  82.2  --   --  84.1  PLT 132* 110*  --  113*  --   --  120*    Cardiac Enzymes: No results for input(s): CKTOTAL, CKMB, CKMBINDEX, TROPONINI in the last 168 hours.  BNP: Invalid input(s): POCBNP  CBG: Recent Labs  Lab 06/15/21 1943 06/15/21 2357 06/16/21 0112 06/16/21 0327 06/16/21 0748  GLUCAP 104* 69* 97 88 81    Microbiology: Results for orders placed or performed during the hospital encounter of 06/14/21  Resp Panel by RT-PCR (Flu A&B, Covid) Nasopharyngeal Swab     Status: None   Collection Time: 06/14/21  9:38 AM   Specimen: Nasopharyngeal Swab; Nasopharyngeal(NP) swabs in vial transport medium  Result Value Ref Range Status   SARS Coronavirus 2 by RT PCR NEGATIVE NEGATIVE Final    Comment: (NOTE) SARS-CoV-2 target nucleic acids are NOT DETECTED.  The SARS-CoV-2 RNA  is generally detectable in upper respiratory specimens during the acute phase of infection. The lowest concentration of SARS-CoV-2 viral copies this assay can detect is 138 copies/mL. A negative result does not preclude SARS-Cov-2 infection and should not be used as the sole basis for treatment or other patient management decisions. A negative result may occur with  improper specimen collection/handling, submission of specimen other than nasopharyngeal swab, presence of viral mutation(s) within the areas targeted by this assay, and inadequate number of viral copies(<138 copies/mL). A negative result must be combined with clinical observations, patient history, and epidemiological information. The expected result is Negative.  Fact Sheet for Patients:  EntrepreneurPulse.com.au  Fact Sheet for Healthcare Providers:  IncredibleEmployment.be  This  test is no t yet approved or cleared by the [place] FDA and  has been authorized for detection and/or diagnosis of SARS-CoV-2 by FDA under an Emergency Use Authorization (EUA). This EUA will remain  in effect (meaning this test can be used) for the duration of the COVID-19 declaration under Section 564(b)(1) of the Act, 21 U.S.C.section 360bbb-3(b)(1), unless the authorization is terminated  or revoked sooner.       Influenza A by PCR NEGATIVE NEGATIVE Final   Influenza B by PCR NEGATIVE NEGATIVE Final    Comment: (NOTE) The Xpert Xpress SARS-CoV-2/FLU/RSV plus assay is intended as an aid in the diagnosis of influenza from Nasopharyngeal swab specimens and should not be used as a sole basis for treatment. Nasal washings and aspirates are unacceptable for Xpert Xpress SARS-CoV-2/FLU/RSV testing.  Fact Sheet for Patients: EntrepreneurPulse.com.au  Fact Sheet for Healthcare Providers: IncredibleEmployment.be  This test is not yet approved or cleared by the [place] FDA and has been authorized for detection and/or diagnosis of SARS-CoV-2 by FDA under an Emergency Use Authorization (EUA). This EUA will remain in effect (meaning this test can be used) for the duration of the COVID-19 declaration under Section 564(b)(1) of the Act, 21 U.S.C. section 360bbb-3(b)(1), unless the authorization is terminated or revoked.  Performed at [facility], [address] [place], [place] [address]   MRSA Next Gen by PCR, Nasal     Status: None   Collection Time: 06/14/21  3:20 PM   Specimen: Nasal Mucosa; Nasal Swab  Result Value Ref Range Status   MRSA by PCR Next Gen NOT DETECTED NOT DETECTED Final    Comment: (NOTE) The GeneXpert MRSA Assay (FDA approved for NASAL specimens only), is one component of a comprehensive MRSA colonization surveillance program. It is not intended to diagnose MRSA infection nor to guide or monitor treatment  for MRSA infections. Test performance is not FDA approved in patients less than 64 years old. Performed at [facility], [address] [place],  [address]     Coagulation Studies: Recent Labs    06/14/21 1155 06/15/21 0513  LABPROT 18.2* 15.9*  INR 1.5* 1.3*    Urinalysis: Recent Labs    06/14/21 [place] <1.005*  PHURINE 5.5  GLUCOSEU NEGATIVE  HGBUR NEGATIVE  BILIRUBINUR NEGATIVE  KETONESUR NEGATIVE  PROTEINUR NEGATIVE  NITRITE NEGATIVE  LEUKOCYTESUR NEGATIVE      Imaging: CT HEAD WO CONTRAST (5MM)  Result Date: 06/14/2021 CLINICAL DATA:  Trauma EXAM: CT HEAD WITHOUT CONTRAST TECHNIQUE: Contiguous axial images were obtained from the base of the skull through the vertex without intravenous contrast. COMPARISON:  CT head 05/16/2021, brain MRI 01/18/2008 FINDINGS: Brain: There is no evidence of acute intracranial hemorrhage, extra-axial fluid collection, or acute  infarct. Global parenchymal volume loss is unchanged. 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 The ventricles are stable in size. There is no mass lesion. There is no midline shift. Vascular: There is calcification of the bilateral cavernous ICAs. Skull: Deformity of the frontal calvarium is unchanged since 2009. There is no evidence of acute fracture. There is no suspicious osseous lesion. Sinuses/Orbits: The paranasal sinuses are clear. The globes and orbits are unremarkable. Other: None. IMPRESSION: 1. No acute intracranial hemorrhage or calvarial fracture. 2. Stable remote infarcts as above. Electronically Signed   By: Valetta Mole M.D.   On: 06/14/2021 11:45   US AORTA DUPLEX LIMITED  Result Date: 06/14/2021 CLINICAL DATA:  Abdominal aortic aneurysm EXAM: ULTRASOUND OF ABDOMINAL AORTA TECHNIQUE: Ultrasound examination of the abdominal aorta and proximal common iliac arteries was performed to evaluate for aneurysm. Additional  color and Doppler images of the distal aorta were obtained to document patency. COMPARISON:  06/04/2021 FINDINGS: Abdominal aortic measurements as follows: Proximal:  2.3 x 2.3 cm Mid:  2.3 x 2.7 cm Distal:  4.3 x 4.7 cm, significant atheromatous plaque Patent: Yes, peak systolic velocity is 0000000 cm/s Right common iliac artery: Not visualized due to bowel gas. Left common iliac artery: Not visualized due to bowel gas. IMPRESSION: 1. Infrarenal abdominal aortic aneurysm measuring 4.3 x 4.7 cm. Recommend follow-up CT/MR every 6 months and vascular consultation. This recommendation follows ACR consensus guidelines: White Paper of the ACR Incidental Findings Committee II on Vascular Findings. J Am Coll Radiol 2013; 10:789-794. Electronically Signed   By: Randa Ngo M.D.   On: 06/14/2021 19:38   CT C SPINE LTD WO OR W CONTRAST  Result Date: 06/14/2021 CLINICAL DATA:  Fall EXAM: CT CERVICAL SPINE WITHOUT CONTRAST TECHNIQUE: Multidetector CT imaging of the cervical spine was performed without intravenous contrast. Multiplanar CT image reconstructions were also generated. COMPARISON:  CT cervical spine 05/16/2021 FINDINGS: Image quality is degraded by motion artifact. Additionally, the cervical spine is only image through the C6 vertebral body. Alignment: There is trace anterolisthesis of C4 on C5, unchanged. There is slight focal kyphosis centered at C6. There is no traumatic malalignment to the level imaged. There is no jumped or perched facet. Skull base and vertebrae: Skull base alignment is within normal limits. Vertebral body heights are preserved. There is no evidence of acute fracture. Soft tissues and spinal canal: No prevertebral fluid or swelling. No visible canal hematoma. Disc levels: There is bulky uncovertebral arthropathy at C5-C6. There is multilevel neural foraminal stenosis, unchanged. The osseous spinal canal is patent. Upper chest: Not imaged Other: None. IMPRESSION: 1. Image quality is degraded  by motion artifact, and the cervical spine is only imaged through the C6 vertebral body. 2. Within these confines, no acute fracture or traumatic malalignment of the level imaged. Electronically Signed   By: Valetta Mole M.D.   On: 06/14/2021 12:33     Medications:    sodium chloride     sodium chloride Stopped (06/15/21 1946)   lactated ringers Stopped (06/14/21 1925)   norepinephrine (LEVOPHED) Adult infusion Stopped (06/14/21 1414)    sodium chloride   Intravenous Once   Chlorhexidine Gluconate Cloth  6 each Topical Daily   ondansetron (ZOFRAN) IV  4 mg Intravenous Once   pantoprazole  40 mg Intravenous Q12H   acetaminophen, ipratropium-albuterol, ondansetron (ZOFRAN) IV  Assessment/ Plan:  87 y.o. male With past medical history of hypertension, coronary disease, chronic systolic heart failure, chronic kidney disease  stage IIIb, anemia of chronic kidney disease, proteinuria who presents now with acute GI bleeding.       1)Renal   Acute kidney injury Patient has AKI secondary to ATN Patient has AKI on CKD Patient has baseline CKD stage IIIb Patient has CKD going back to 2019 Patient CKD has been marked with multiple episodes of AKI Patient is CKD most likely secondary to long-term history of hypertension   Patient had AKI with a peak creatinine of 2.2 Patient creatinine is now better at 1.7  2) hypotension Patient was earlier requiring vasopressors Patient is now stable  3)Anemia of chronic disease and GI bleed  CBC Latest Ref Rng & Units 06/16/2021 06/15/2021 06/15/2021  WBC 4.0 - 10.5 K/uL 5.3 - -  Hemoglobin 13.0 - 17.0 g/dL 9.2(L) 9.5(L) 9.4(L)  Hematocrit 39.0 - 52.0 % 27.0(L) 27.1(L) 26.9(L)  Platelets 150 - 400 K/uL 120(L) - -       HGb is at goal (9--11) Patient hemoglobin was less than 7 earlier Patient has received PRBC  4) Secondary hyperparathyroidism -CKD Mineral-Bone Disorder    Lab Results  Component Value Date   CALCIUM 8.7 (L) 06/16/2021    PHOS 5.0 (H) 06/15/2021    Secondary Hyperparathyroidism present Patient intact PTH level was high at 125 with an outpatient Phosphorus at goal for the acute state   5) GI bleed Primary team and GI is following  6) Electrolytes   BMP Latest Ref Rng & Units 06/16/2021 06/15/2021 06/15/2021  Glucose 70 - 99 mg/dL 157(H) 100(H) -  BUN 8 - 23 mg/dL 68(H) 99(H) -  Creatinine 0.61 - 1.24 mg/dL 1.71(H) 2.12(H) -  Sodium 135 - 145 mmol/L 138 132(L) -  Potassium 3.5 - 5.1 mmol/L 4.2 5.0 5.4(H)  Chloride 98 - 111 mmol/L 108 105 -  CO2 22 - 32 mmol/L 22 24 -  Calcium 8.9 - 10.3 mg/dL 8.7(L) 8.8(L) -     Sodium Hypovolemic hyponatremia Patient sodium is now better   Potassium Hyperkalemia Now better    7)Acid base  Patient was acidotic Co2 is now at goal    Plan    We will continue the current treatment   LOS: 2 Mer[facility]e Rollo [facility] 9/11/202210:09 AM

## 2021-06-16 NOTE — Progress Notes (Signed)
PHARMACY CONSULT NOTE - FOLLOW UP  Pharmacy Consult for Electrolyte Monitoring and Replacement   Recent Labs: Potassium (mmol/L)  Date Value  06/16/2021 4.2   Magnesium (mg/dL)  Date Value  06/15/2021 2.5 (H)   Calcium (mg/dL)  Date Value  06/16/2021 8.7 (L)   Albumin (g/dL)  Date Value  06/14/2021 2.4 (L)   Phosphorus (mg/dL)  Date Value  06/15/2021 5.0 (H)   Sodium (mmol/L)  Date Value  06/16/2021 138   Corrected Calcium: 8.56  Assessment: Patient is an 87 y/o M with medical history including systolic HF with an EF of 25%, HTN, HLD, CVA, CKD, CAD s/p CABG who was recently admitted for hyponatremia, CHF exacerbation, and bradycardia who is now presenting with syncope. Pharmacy consulted to assist with electrolyte monitoring and replacement as indicated.  Labs notable for acute hyponatremia, hyperkalemia, non-anion gap metabolic acidosis, Scr 0000000 (BL~1.5) and Hgb 7.1 (BL 10)  Goal of Therapy:  WNL  Plan:  Na 138  K 4.2  Scr 1.71 No electrolyte replacement at this time Pharmacy will continue to follow along   [place], PharmD 06/16/2021 10:09 AM

## 2021-06-16 NOTE — Progress Notes (Signed)
[facility] Gastroenterology Inpatient Follow-up/Progress Note   Patient ID: Daniel Bolton is a 87 y.o. male.  Overnight Events / Subjective Findings Information garnered via nursing and translator Pt much improved overnight. Hgb remains stable post endoscopy. Decreasing bowel movements. Tolerating clear liquids without issue. Would like to increase diet. No abdominal pain, nausea, vomiting.  No other acute GI complaints  Review of Systems  Constitutional:  Negative for appetite change, fever and unexpected weight change.       Garnered through interpreter  HENT:  Negative for trouble swallowing.   Respiratory:  Negative for shortness of breath and wheezing.   Cardiovascular:  Negative for chest pain.  Gastrointestinal:  Positive for blood in stool (improving). Negative for abdominal pain, constipation, diarrhea, nausea and vomiting.  Psychiatric/Behavioral:  Negative for confusion.   All other systems reviewed and are negative.   Medications  Current Facility-Administered Medications:    0.9 %  sodium chloride infusion (Manually program via Guardrails IV Fluids), , Intravenous, Once, Graves, Dana E, NP   0.9 %  sodium chloride infusion, 10 mL/hr, Intravenous, Once, Vladimir Crofts, MD   0.9 %  sodium chloride infusion, 250 mL, Intravenous, Continuous, Graves, Dana E, NP, Stopping Infusion hung by another clincian at 06/15/21 1946   acetaminophen (TYLENOL) tablet 650 mg, 650 mg, Oral, Q6H PRN, Graves, Dana E, NP, 650 mg at 06/15/21 1225   Chlorhexidine Gluconate Cloth 2 % PADS 6 each, 6 each, Topical, Daily, Ottie Glazier, MD, 6 each at 06/15/21 1945   ipratropium-albuterol (DUONEB) 0.5-2.5 (3) MG/3ML nebulizer solution 3 mL, 3 mL, Nebulization, Q6H PRN, Graves, Raeford Razor, NP   lactated ringers infusion, , Intravenous, Continuous, Graves, Raeford Razor, NP, Held at 06/14/21 1925   norepinephrine (LEVOPHED) '4mg'$  in 272m premix infusion, 2-10 mcg/min, Intravenous, Titrated, Graves, DRaeford Razor NP,  Stopped at 06/14/21 1414   ondansetron (ZOFRAN) injection 4 mg, 4 mg, Intravenous, Q6H PRN, Graves, Dana E, NP, 4 mg at 06/14/21 1149   ondansetron (ZOFRAN) injection 4 mg, 4 mg, Intravenous, Once, SVladimir Crofts MD   pantoprazole (PROTONIX) injection 40 mg, 40 mg, Intravenous, Q12H, RAnnamaria Helling DO, 40 mg at 06/15/21 2206  sodium chloride     sodium chloride Stopped (06/15/21 1946)   lactated ringers Stopped (06/14/21 1925)   norepinephrine (LEVOPHED) Adult infusion Stopped (06/14/21 1414)    acetaminophen, ipratropium-albuterol, ondansetron (ZOFRAN) IV   Objective    Vitals:   06/16/21 0330 06/16/21 0400 06/16/21 0500 06/16/21 0600  BP:  (!) 145/64 (!) 167/53 (!) 134/48  Pulse:  85 63 67  Resp:  (!) 24 13 (!) 27  Temp:  98.9 F (37.2 C)    TempSrc:      SpO2:  97% 100% 99%  Weight: 58.6 kg     Height:         Physical Exam Vitals and nursing note reviewed.  Constitutional:      General: He is not in acute distress.    Appearance: He is not ill-appearing, toxic-appearing or diaphoretic.  HENT:     Head: Normocephalic and atraumatic.     Nose: Nose normal.     Mouth/Throat:     Mouth: Mucous membranes are moist.     Pharynx: Oropharynx is clear.  Eyes:     General: No scleral icterus.    Extraocular Movements: Extraocular movements intact.  Cardiovascular:     Rate and Rhythm: Normal rate and regular rhythm.     Heart sounds: Normal heart sounds.  Pulmonary:  Effort: Pulmonary effort is normal. No respiratory distress.     Breath sounds: Normal breath sounds. No wheezing, rhonchi or rales.  Abdominal:     General: Abdomen is flat. Bowel sounds are normal. There is no distension.     Palpations: Abdomen is soft.     Tenderness: There is no abdominal tenderness. There is no guarding or rebound.  Musculoskeletal:     Cervical back: Neck supple.     Right lower leg: No edema.     Left lower leg: No edema.  Skin:    General: Skin is warm and dry.      Coloration: Skin is not jaundiced or pale.  Neurological:     General: No focal deficit present.     Mental Status: He is alert and oriented to person, place, and time. Mental status is at baseline.  Psychiatric:        Mood and Affect: Mood normal.        Behavior: Behavior normal.        Thought Content: Thought content normal.        Judgment: Judgment normal.     Laboratory Data Recent Labs  Lab 06/14/21 0915 06/14/21 1519 06/15/21 0029 06/15/21 0513 06/15/21 1105 06/15/21 1800  WBC 7.6 8.8  --  8.0  --   --   HGB 7.1* 6.9*   < > 9.5* 9.4* 9.5*  HCT 20.9* 19.8*   < > 26.4* 26.9* 27.1*  PLT 132* 110*  --  113*  --   --   NEUTOPHILPCT  --  89  --   --   --   --   LYMPHOPCT  --  5  --   --   --   --   MONOPCT  --  5  --   --   --   --   EOSPCT  --  0  --   --   --   --    < > = values in this interval not displayed.    Recent Labs  Lab 06/14/21 0915 06/14/21 1155 06/15/21 0029 06/15/21 0513  NA 120* 125*  --  132*  K 6.9* 5.2* 5.4* 5.0  CL 93* 99  --  105  CO2 17* 18*  --  24  BUN 108* 105*  --  99*  CREATININE 2.15* 2.23*  --  2.12*  CALCIUM 8.0* 7.6*  --  8.8*  PROT  --  4.8*  --   --   BILITOT  --  0.7  --   --   ALKPHOS  --  95  --   --   ALT  --  42  --   --   AST  --  36  --   --   GLUCOSE 86 73  --  100*    Recent Labs  Lab 06/14/21 1155 06/15/21 0513  INR 1.5* 1.3*       Imaging Studies: CT HEAD WO CONTRAST (5MM)  Result Date: 06/14/2021 CLINICAL DATA:  Trauma EXAM: CT HEAD WITHOUT CONTRAST TECHNIQUE: Contiguous axial images were obtained from the base of the skull through the vertex without intravenous contrast. COMPARISON:  CT head 05/16/2021, brain MRI 01/18/2008 FINDINGS: Brain: There is no evidence of acute intracranial hemorrhage, extra-axial fluid collection, or acute infarct. Global parenchymal volume loss is unchanged. 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 The ventricles are stable  in size. There is no mass lesion. There is  no midline shift. Vascular: There is calcification of the bilateral cavernous ICAs. Skull: Deformity of the frontal calvarium is unchanged since 2009. There is no evidence of acute fracture. There is no suspicious osseous lesion. Sinuses/Orbits: The paranasal sinuses are clear. The globes and orbits are unremarkable. Other: None. IMPRESSION: 1. No acute intracranial hemorrhage or calvarial fracture. 2. Stable remote infarcts as above. Electronically Signed   By: Valetta Mole M.D.   On: 06/14/2021 11:45   US AORTA DUPLEX LIMITED  Result Date: 06/14/2021 CLINICAL DATA:  Abdominal aortic aneurysm EXAM: ULTRASOUND OF ABDOMINAL AORTA TECHNIQUE: Ultrasound examination of the abdominal aorta and proximal common iliac arteries was performed to evaluate for aneurysm. Additional color and Doppler images of the distal aorta were obtained to document patency. COMPARISON:  06/04/2021 FINDINGS: Abdominal aortic measurements as follows: Proximal:  2.3 x 2.3 cm Mid:  2.3 x 2.7 cm Distal:  4.3 x 4.7 cm, significant atheromatous plaque Patent: Yes, peak systolic velocity is 0000000 cm/s Right common iliac artery: Not visualized due to bowel gas. Left common iliac artery: Not visualized due to bowel gas. IMPRESSION: 1. Infrarenal abdominal aortic aneurysm measuring 4.3 x 4.7 cm. Recommend follow-up CT/MR every 6 months and vascular consultation. This recommendation follows ACR consensus guidelines: White Paper of the ACR Incidental Findings Committee II on Vascular Findings. J Am Coll Radiol 2013; 10:789-794. Electronically Signed   By: Randa Ngo M.D.   On: 06/14/2021 19:38   CT C SPINE LTD WO OR W CONTRAST  Result Date: 06/14/2021 CLINICAL DATA:  Fall EXAM: CT CERVICAL SPINE WITHOUT CONTRAST TECHNIQUE: Multidetector CT imaging of the cervical spine was performed without intravenous contrast. Multiplanar CT image reconstructions were also generated. COMPARISON:  CT cervical spine  05/16/2021 FINDINGS: Image quality is degraded by motion artifact. Additionally, the cervical spine is only image through the C6 vertebral body. Alignment: There is trace anterolisthesis of C4 on C5, unchanged. There is slight focal kyphosis centered at C6. There is no traumatic malalignment to the level imaged. There is no jumped or perched facet. Skull base and vertebrae: Skull base alignment is within normal limits. Vertebral body heights are preserved. There is no evidence of acute fracture. Soft tissues and spinal canal: No prevertebral fluid or swelling. No visible canal hematoma. Disc levels: There is bulky uncovertebral arthropathy at C5-C6. There is multilevel neural foraminal stenosis, unchanged. The osseous spinal canal is patent. Upper chest: Not imaged Other: None. IMPRESSION: 1. Image quality is degraded by motion artifact, and the cervical spine is only imaged through the C6 vertebral body. 2. Within these confines, no acute fracture or traumatic malalignment of the level imaged. Electronically Signed   By: Valetta Mole M.D.   On: 06/14/2021 12:33   DG Chest Portable 1 View  Result Date: 06/14/2021 CLINICAL DATA:  falls, confused. eval infiltrate,p tx EXAM: PORTABLE CHEST 1 VIEW COMPARISON:  Radiograph 06/03/2021, chest CT 06/05/2019 FINDINGS: Unchanged cardiomediastinal silhouette with prior median sternotomy and postsurgical changes of CABG. There are diffuse mild interstitial opacities. There is a small right pleural effusion. No visible pneumothorax. There is no acute osseous abnormality on single frontal view of the chest. IMPRESSION: Mild pulmonary edema and small right pleural effusion. Electronically Signed   By: Maurine Simmering M.D.   On: 06/14/2021 10:24    Assessment:  # Upper GIB  - 2/2 Duodenal ulcer with visible vessel now s/p endoscopy and treatment with epi and cauterization. Multiple other duodenal ulcers and gastric ulcers and erosions. Additional esophagitis             -  s/p 3  u prbc with appropriate improvement             - BUN/CR 108/2.15 on presentation             - initially presented hypotensive, improved with resuscitation             - on asa plavix- taken on 06/13/21 # Hemorrhagic shock- resolved with resuscitation # hyperkalemia- resolved- now 5.0 # hyponatremia # thrombocytopenia # coaguloapthy- improving # AKI on CKD # Syncopal episode # bradycardia # Combined systolic, diastolic CHF; EF 123XX123; grade 2 dd # AAA- increasing in size 4.4  Plan:  Hgb stable s/p endoscopy with intervention. Bowel movement frequency decreasing and clearing of blood (will take a few days) Recommend repeat egd in 8 weeks- business card provided for follow-up Ppi can be transitioned to 40 mg po twice daily - to continue through endoscopy in 8 weeks Can advance diet as tolerated- GI soft Monitor H&H transfusion and resuscitation as per primary team Electrolyte management as per primary team Continue to hold aspirin and Plavix as well as other DVT prophylaxis for 24 more hours -Given his cardiac stents were placed >10 years ago, is it necessary for him to remain on these agents, if so, can he just be on one. Would recommend indefinite ppi if he is to remain on either or both agents.   I personally performed the service.  Management of other medical comorbidities as per primary team  GI to sign off. Available as needed. Please do not hesitate to call regarding questions or concerns.  Thank you for allowing [place] to participate in this patient's care.   Annamaria Helling, DO [facility] Gastroenterology  Portions of the record may have been created with voice recognition software. Occasional wrong-word or 'sound-a-like' substitutions may have occurred due to the inherent limitations of voice recognition software.  Read the chart carefully and recognize, using context, where substitutions may have occurred.

## 2021-06-16 NOTE — Progress Notes (Signed)
NAME:  Daniel Bolton, MRN:  [id number], DOB:  06/08/1934, LOS: 2 ADMISSION DATE:  06/14/2021, CONSULTATION DATE: 06/14/2021 REFERRING MD: Dr. Tamala Julian, CHIEF COMPLAINT: Fall and syncope   History of Present Illness:  This is an 87 yo male who presented to [facility] ER on 09/9 for evaluation of a fall and syncope.  Per ER notes the pt reported having a fall early the morning of 09/9 around 04:00 am during which he struck his head, but did not lose consciousness.  He also reported hallucinations overnight "seeing his granddaughter" who was not at his home.  He also has had constipation over the past couple of days, however he had a bloody bowel movement overnight.  It was also reported the pt has fallen a couple of times over the past few days.  He was recently discharged from [facility] on 09/1 following treatment of acute on chronic combined systolic and diastolic CHF, sinus bradycardia, and hypertensive emergency.  He also saw his outpatient Cardiologist on 09/6 with recommendations to continue dual anti-platelet therapy; reduce dose of metoprolol xl from 25 mg daily to 12.5 mg daily due to recent bradycardia; continue furosemide but remain off K+, continue simvastatin; and continue olmesartan.    06/15/21-patient s/p endoscopy with finding of duodenal ulcers and gastric ulcers. Family is at bedside.  06/16/21- patient is slightly improved and is urinating on his own with urinal . Renal function improved. Mentation imprvoed, no additional GI bleeding.    Pertinent  Medical History  Stage 3b Chronic Kidney Disease CAD Essential HTN  Constipation  Stroke  HLD  AAA (abdominal aortic aneurysm) Chronic combined systolic and diastolic CHF  BPH CABG   Significant Hospital Events: Including procedures, antibiotic start and stop dates in addition to other pertinent events   09/9: Pt admitted to ICU with acute on chronic renal failure with hyperkalemia and metabolic acidosis and hemorrhagic shock secondary to  acute GI bleed 09/9: CT Head>> 09/9 US Aorta Duplex Limited>>  Interim History / Subjective:  Pt c/o headache and bilateral lower extremity pain/tremors   Objective   Blood pressure (!) 155/55, pulse (!) 58, temperature 98.9 F (37.2 C), resp. rate 14, height '5\' 2"'$  (1.575 m), weight 58.6 kg, SpO2 100 %.        Intake/Output Summary (Last 24 hours) at 06/16/2021 1048 Last data filed at 06/16/2021 0600 Gross per 24 hour  Intake --  Output 3225 ml  Net -3225 ml    Filed Weights   06/14/21 1426 06/15/21 0330 06/16/21 0330  Weight: 64.2 kg 58.6 kg 58.6 kg    Examination: General: acutely ill appearing male, resting in bed NAD  HENT: supple, no JVD Lungs: clear throughout, even, non labored  Cardiovascular: sinus tachycardia, no R/G, 2+ radial/1+ distal pulses, 2+ bilateral ankle swelling  Abdomen: +BS x4, soft, non tender, non distended  Extremities: moves all extremities, normal tone  Neuro: alert and oriented, follows commands, PERRLA GU: voiding clear yellow urine   Resolved Hospital Problem list     Assessment & Plan:  Acute respiratory failure secondary to metabolic acidosis  - Supplemental O2 for dyspnea and/or hypoxia  - Prn bronchodilator therapy   Hemorrhagic shock secondary to acute GI bleed Abdominal aortic aneurysm~CTA 06/04/21 revealed increased aneurysm from 4.1 cm to 4.4 cm since 12/11/2020 exam  Hx: HTN, Chronic combined systolic diastolic CHF, and Stroke  - Continuous telemetry monitoring  - Hold outpatient antihypertensives - Gentle fluid resuscitation or prn levophed gtt to maintain map >65 -  US Aorta Duplex Limited to assess abdominal aortic aneurysm   Hyponatremia likely due to hypovolemia  Acute on chronic renal failure with hyperkalemia and metabolic acidosis: K+ 123XX123 - Trend BMP and VBG  - Replace electrolytes as indicated  - Monitor UOP - Avoid nephrotoxic medications  - Nephrology consulted appreciate input  Acute on chronic anemia due  to acute GI bleed - Trend CBC  - Monitor for s/sx of bleeding  - Transfuse for hgb <7 and/or active bleeding  - Hold outpatient aspirin and plavix  - Continue protonix gtt for now  - Keep NPO for now  - Gastroenterology consulted appreciate input~will likely proceed with upper endoscopy in the am 09/10 once hyperkalemia resolved  Acute metabolic encephalopathy  Falls  - CT Head and Spine pending - Avoid sedating medications when able  - Frequent reorientation  - Maintain sleep/wake cycle   Best Practice (right click and "Reselect all SmartList Selections" daily)   Diet/type: NPO w/ oral meds DVT prophylaxis: SCD GI prophylaxis: PPI Lines: N/A Foley:  No and not indicated  Code Status:  full code Last date of multidisciplinary goals of care discussion [N/A]  Labs   CBC: Recent Labs  Lab 06/14/21 0915 06/14/21 1519 06/15/21 0029 06/15/21 0513 06/15/21 1105 06/15/21 1800 06/16/21 0653  WBC 7.6 8.8  --  8.0  --   --  5.3  NEUTROABS  --  7.9*  --   --   --   --   --   HGB 7.1* 6.9* 9.7* 9.5* 9.4* 9.5* 9.2*  HCT 20.9* 19.8* 27.3* 26.4* 26.9* 27.1* 27.0*  MCV 88.2 86.8  --  82.2  --   --  84.1  PLT 132* 110*  --  113*  --   --  120*     Basic Metabolic Panel: Recent Labs  Lab 06/14/21 0915 06/14/21 1155 06/15/21 0029 06/15/21 0513 06/16/21 0835  NA 120* 125*  --  132* 138  K 6.9* 5.2* 5.4* 5.0 4.2  CL 93* 99  --  105 108  CO2 17* 18*  --  24 22  GLUCOSE 86 73  --  100* 157*  BUN 108* 105*  --  99* 68*  CREATININE 2.15* 2.23*  --  2.12* 1.71*  CALCIUM 8.0* 7.6*  --  8.8* 8.7*  MG  --  2.2  --  2.5*  --   PHOS  --  4.3  --  5.0*  --     GFR: Estimated Creatinine Clearance: 24.4 mL/min (A) (by C-G formula based on SCr of 1.71 mg/dL (H)). Recent Labs  Lab 06/14/21 0915 06/14/21 1519 06/15/21 0513 06/16/21 0653  WBC 7.6 8.8 8.0 5.3     Liver Function Tests: Recent Labs  Lab 06/14/21 1155  AST 36  ALT 42  ALKPHOS 95  BILITOT 0.7  PROT 4.8*   ALBUMIN 2.4*    No results for input(s): LIPASE, AMYLASE in the last 168 hours. No results for input(s): AMMONIA in the last 168 hours.  ABG    Component Value Date/Time   HCO3 18.9 (L) 06/14/2021 1155   ACIDBASEDEF 7.7 (H) 06/14/2021 1155   O2SAT 53.9 06/14/2021 1155      Coagulation Profile: Recent Labs  Lab 06/14/21 1155 06/15/21 0513  INR 1.5* 1.3*     Cardiac Enzymes: No results for input(s): CKTOTAL, CKMB, CKMBINDEX, TROPONINI in the last 168 hours.  HbA1C: Hgb A1c MFr Bld  Date/Time Value Ref Range Status  06/04/2021 04:59 PM 5.6 4.8 -  5.6 % Final    Comment:    (NOTE)         Prediabetes: 5.7 - 6.4         Diabetes: >6.4         Glycemic control for adults with diabetes: <7.0     CBG: Recent Labs  Lab 06/15/21 1943 06/15/21 2357 06/16/21 0112 06/16/21 0327 06/16/21 0748  GLUCAP 104* 69* 97 88 81     Review of Systems: Positives in BOLD   Gen: falls, fever, chills, weight change, fatigue, night sweats HEENT: Denies blurred vision, double vision, hearing loss, tinnitus, sinus congestion, rhinorrhea, sore throat, neck stiffness, dysphagia PULM: Denies shortness of breath, cough, sputum production, hemoptysis, wheezing CV: Denies chest pain, edema, orthopnea, paroxysmal nocturnal dyspnea, palpitations GI: Denies abdominal pain, nausea, vomiting, diarrhea, hematochezia, melena, constipation, change in bowel habits GU: Denies dysuria, hematuria, polyuria, oliguria, urethral discharge Endocrine: Denies hot or cold intolerance, polyuria, polyphagia or appetite change Derm: Denies rash, dry skin, scaling or peeling skin change Heme: Denies easy bruising, bleeding, bleeding gums Neuro: syncope, headache, numbness, weakness, slurred speech, loss of memory or consciousness  Past Medical History:  He,  has a past medical history of CHF (congestive heart failure) ([place]), Chronic kidney disease, Hypertension, Hyponatremia, and Stroke ([place]).   Surgical  History:   Past Surgical History:  Procedure Laterality Date   BRAIN SURGERY     CARDIAC SURGERY       Social History:   reports that he has never smoked. He has never used smokeless tobacco. He reports that he does not drink alcohol and does not use drugs.   Family History:  His family history includes Cancer in his sister; Heart disease in his sister.   Allergies Allergies  Allergen Reactions   Hydralazine     Pt has severe dizziness with it, and does not want to take it.     Home Medications  Prior to Admission medications   Medication Sig Start Date End Date Taking? Authorizing Provider  amLODipine (NORVASC) 10 MG tablet Take 1 tablet (10 mg total) by mouth daily. 06/07/21   Jennye Boroughs, MD  aspirin EC 81 MG EC tablet Take 1 tablet (81 mg total) by mouth daily. Swallow whole. 03/14/21   Danford, Suann Larry, MD  clopidogrel (PLAVIX) 75 MG tablet Take 75 mg by mouth daily.    [provider]  feeding supplement (ENSURE ENLIVE / ENSURE PLUS) LIQD Take 237 mLs by mouth 2 (two) times daily between meals. 04/14/21   Enzo Bi, MD  furosemide (LASIX) 40 MG tablet Take 1 tablet (40 mg total) by mouth daily. 04/14/21 07/13/21  Enzo Bi, MD  metoprolol succinate (TOPROL-XL) 50 MG 24 hr tablet Take 0.5 tablets (25 mg total) by mouth daily. Take with or immediately following a meal. 06/06/21   Jennye Boroughs, MD  olmesartan (BENICAR) 20 MG tablet Take 20 mg by mouth daily.    [provider]  simvastatin (ZOCOR) 80 MG tablet Take 80 mg by mouth daily.    [provider]    Critical care provider statement:   Total critical care time: 33 minutes   Performed by: Lanney Gins MD   Critical care time was exclusive of separately billable procedures and treating other patients.   Critical care was necessary to treat or prevent imminent or life-threatening deterioration.   Critical care was time spent personally by me on the following activities: development of  treatment plan with patient and/or surrogate as well as  nursing, discussions with consultants, evaluation of patient's response to treatment, examination of patient, obtaining history from patient or surrogate, ordering and performing treatments and interventions, ordering and review of laboratory studies, ordering and review of radiographic studies, pulse oximetry and re-evaluation of patient's condition.    Ottie Glazier, M.D.  Pulmonary & Critical Care Medicine

## 2021-06-17 ENCOUNTER — Encounter: Payer: Self-pay | Admitting: Gastroenterology

## 2021-06-17 DIAGNOSIS — N1832 Chronic kidney disease, stage 3b: Secondary | ICD-10-CM

## 2021-06-17 DIAGNOSIS — K922 Gastrointestinal hemorrhage, unspecified: Secondary | ICD-10-CM

## 2021-06-17 DIAGNOSIS — D62 Acute posthemorrhagic anemia: Secondary | ICD-10-CM

## 2021-06-17 DIAGNOSIS — N179 Acute kidney failure, unspecified: Secondary | ICD-10-CM | POA: Diagnosis not present

## 2021-06-17 DIAGNOSIS — R578 Other shock: Secondary | ICD-10-CM | POA: Diagnosis not present

## 2021-06-17 LAB — BASIC METABOLIC PANEL
Anion gap: 5 (ref 5–15)
BUN: 50 mg/dL — ABNORMAL HIGH (ref 8–23)
CO2: 23 mmol/L (ref 22–32)
Calcium: 8.4 mg/dL — ABNORMAL LOW (ref 8.9–10.3)
Chloride: 107 mmol/L (ref 98–111)
Creatinine, Ser: 1.43 mg/dL — ABNORMAL HIGH (ref 0.61–1.24)
GFR, Estimated: 48 mL/min — ABNORMAL LOW (ref >60)
Glucose, Bld: 130 mg/dL — ABNORMAL HIGH (ref 70–99)
Potassium: 3.9 mmol/L (ref 3.5–5.1)
Sodium: 135 mmol/L (ref 135–145)

## 2021-06-17 LAB — CBC WITH DIFFERENTIAL/PLATELET
Abs Immature Granulocytes: 0.02 10*3/uL (ref 0.00–0.07)
Basophils Absolute: 0 10*3/uL (ref 0.0–0.1)
Basophils Relative: 1 %
Eosinophils Absolute: 0.3 10*3/uL (ref 0.0–0.5)
Eosinophils Relative: 5 %
HCT: 25.3 % — ABNORMAL LOW (ref 39.0–52.0)
Hemoglobin: 8.8 g/dL — ABNORMAL LOW (ref 13.0–17.0)
Immature Granulocytes: 0 %
Lymphocytes Relative: 22 %
Lymphs Abs: 1.2 10*3/uL (ref 0.7–4.0)
MCH: 29.7 pg (ref 26.0–34.0)
MCHC: 34.8 g/dL (ref 30.0–36.0)
MCV: 85.5 fL (ref 80.0–100.0)
Monocytes Absolute: 0.4 10*3/uL (ref 0.1–1.0)
Monocytes Relative: 8 %
Neutro Abs: 3.5 10*3/uL (ref 1.7–7.7)
Neutrophils Relative %: 64 %
Platelets: 118 10*3/uL — ABNORMAL LOW (ref 150–400)
RBC: 2.96 MIL/uL — ABNORMAL LOW (ref 4.22–5.81)
RDW: 15.9 % — ABNORMAL HIGH (ref 11.5–15.5)
WBC: 5.6 10*3/uL (ref 4.0–10.5)
nRBC: 0 % (ref 0.0–0.2)

## 2021-06-17 LAB — GLUCOSE, CAPILLARY
Glucose-Capillary: 103 mg/dL — ABNORMAL HIGH (ref 70–99)
Glucose-Capillary: 118 mg/dL — ABNORMAL HIGH (ref 70–99)
Glucose-Capillary: 129 mg/dL — ABNORMAL HIGH (ref 70–99)
Glucose-Capillary: 147 mg/dL — ABNORMAL HIGH (ref 70–99)
Glucose-Capillary: 76 mg/dL (ref 70–99)
Glucose-Capillary: 92 mg/dL (ref 70–99)

## 2021-06-17 LAB — MAGNESIUM: Magnesium: 1.9 mg/dL (ref 1.7–2.4)

## 2021-06-17 MED ORDER — BISACODYL 10 MG RE SUPP: 10.0000 mg | Freq: Every day | RECTAL | Status: DC | PRN

## 2021-06-17 MED ORDER — INSULIN ASPART 100 UNIT/ML IJ SOLN: 0.0000 [IU] | Freq: Every day | INTRAMUSCULAR | Status: DC

## 2021-06-17 MED ORDER — SENNOSIDES-DOCUSATE SODIUM 8.6-50 MG PO TABS
1.0000 | ORAL_TABLET | Freq: Two times a day (BID) | ORAL | Status: DC
  Administered 2021-06-18: 1 via ORAL
  Filled 2021-06-17: qty 1

## 2021-06-17 MED ORDER — POLYETHYLENE GLYCOL 3350 17 G PO PACK: 17.0000 g | PACK | Freq: Every day | ORAL | Status: DC | PRN

## 2021-06-17 MED ORDER — INSULIN ASPART 100 UNIT/ML IJ SOLN
0.0000 [IU] | Freq: Three times a day (TID) | INTRAMUSCULAR | Status: DC
  Administered 2021-06-17 (×2): 2 [IU] via SUBCUTANEOUS
  Filled 2021-06-17 (×2): qty 1

## 2021-06-17 NOTE — Progress Notes (Signed)
NAME:  Daniel Bolton, MRN:  [id number], DOB:  08/01/1934, LOS: 3 ADMISSION DATE:  06/14/2021, CONSULTATION DATE: 06/14/2021 REFERRING MD: Dr. Tamala Julian, CHIEF COMPLAINT: Fall and syncope   History of Present Illness:  This is an 87 yo male who presented to [facility] ER on 09/9 for evaluation of a fall and syncope.  Per ER notes the pt reported having a fall early the morning of 09/9 around 04:00 am during which he struck his head, but did not lose consciousness.  He also reported hallucinations overnight "seeing his granddaughter" who was not at his home.  He also has had constipation over the past couple of days, however he had a bloody bowel movement overnight.  It was also reported the pt has fallen a couple of times over the past few days.  He was recently discharged from [facility] on 09/1 following treatment of acute on chronic combined systolic and diastolic CHF, sinus bradycardia, and hypertensive emergency.  He also saw his outpatient Cardiologist on 09/6 with recommendations to continue dual anti-platelet therapy; reduce dose of metoprolol xl from 25 mg daily to 12.5 mg daily due to recent bradycardia; continue furosemide but remain off K+, continue simvastatin; and continue olmesartan.    06/15/21-patient s/p endoscopy with finding of duodenal ulcers and gastric ulcers. Family is at bedside.  06/16/21- patient is slightly improved and is urinating on his own with urinal . Renal function improved. Mentation imprvoed, no additional GI bleeding.    06/17/21- no overnight events, patient stable on room air giving 2 thumbs up.  PCCM will sign off and available if needed.  TRH taking over today as primary team  Pertinent  Medical History  Stage 3b Chronic Kidney Disease CAD Essential HTN  Constipation  Stroke  HLD  AAA (abdominal aortic aneurysm) Chronic combined systolic and diastolic CHF  BPH CABG   Significant Hospital Events: Including procedures, antibiotic start and stop dates in addition to  other pertinent events   09/9: Pt admitted to ICU with acute on chronic renal failure with hyperkalemia and metabolic acidosis and hemorrhagic shock secondary to acute GI bleed 09/9: CT Head>> 09/9 US Aorta Duplex Limited>>  Interim History / Subjective:  Pt c/o headache and bilateral lower extremity pain/tremors   Objective   Blood pressure (!) 155/56, pulse 60, temperature (!) 97.5 F (36.4 C), temperature source Oral, resp. rate 12, height '5\' 2"'$  (1.575 m), weight 58.6 kg, SpO2 100 %.        Intake/Output Summary (Last 24 hours) at 06/17/2021 0856 Last data filed at 06/17/2021 [id number] Gross per 24 hour  Intake 240 ml  Output 1475 ml  Net -1235 ml    Filed Weights   06/14/21 1426 06/15/21 0330 06/16/21 0330  Weight: 64.2 kg 58.6 kg 58.6 kg    Examination: General: acutely ill appearing male, resting in bed NAD  HENT: supple, no JVD Lungs: clear throughout, even, non labored  Cardiovascular: sinus tachycardia, no R/G, 2+ radial/1+ distal pulses, 2+ bilateral ankle swelling  Abdomen: +BS x4, soft, non tender, non distended  Extremities: moves all extremities, normal tone  Neuro: alert and oriented, follows commands, PERRLA GU: voiding clear yellow urine   Resolved Hospital Problem list     Assessment & Plan:  Acute respiratory failure secondary to metabolic acidosis  - Supplemental O2 for dyspnea and/or hypoxia  - Prn bronchodilator therapy   Hemorrhagic shock secondary to acute GI bleed Abdominal aortic aneurysm~CTA 06/04/21 revealed increased aneurysm from 4.1 cm to 4.4 cm since  12/11/2020 exam  Hx: HTN, Chronic combined systolic diastolic CHF, and Stroke  - Continuous telemetry monitoring  - Hold outpatient antihypertensives - Gentle fluid resuscitation or prn levophed gtt to maintain map >65 - US Aorta Duplex Limited to assess abdominal aortic aneurysm   Hyponatremia likely due to hypovolemia  Acute on chronic renal failure with hyperkalemia and metabolic acidosis:  K+ [id number] - Trend BMP and VBG  - Replace electrolytes as indicated  - Monitor UOP - Avoid nephrotoxic medications  - Nephrology consulted appreciate input  Acute on chronic anemia due to acute GI bleed - Trend CBC  - Monitor for s/sx of bleeding  - Transfuse for hgb <7 and/or active bleeding  - Hold outpatient aspirin and plavix  - Continue protonix gtt for now  - Keep NPO for now  - Gastroenterology consulted appreciate input~will likely proceed with upper endoscopy in the am 09/10 once hyperkalemia resolved  Acute metabolic encephalopathy  Falls  - CT Head and Spine pending - Avoid sedating medications when able  - Frequent reorientation  - Maintain sleep/wake cycle   Best Practice (right click and "Reselect all SmartList Selections" daily)   Diet/type: NPO w/ oral meds DVT prophylaxis: SCD GI prophylaxis: PPI Lines: N/A Foley:  No and not indicated  Code Status:  full code Last date of multidisciplinary goals of care discussion [N/A]  Labs   CBC: Recent Labs  Lab 06/14/21 0915 06/14/21 1519 06/15/21 0029 06/15/21 0513 06/15/21 1105 06/15/21 1800 06/16/21 0653 06/17/21 0735  WBC 7.6 8.8  --  8.0  --   --  5.3 5.6  NEUTROABS  --  7.9*  --   --   --   --   --  3.5  HGB 7.1* 6.9*   < > 9.5* 9.4* 9.5* 9.2* 8.8*  HCT 20.9* 19.8*   < > 26.4* 26.9* 27.1* 27.0* 25.3*  MCV 88.2 86.8  --  82.2  --   --  84.1 85.5  PLT 132* 110*  --  113*  --   --  120* 118*   < > = values in this interval not displayed.     Basic Metabolic Panel: Recent Labs  Lab 06/14/21 0915 06/14/21 1155 06/15/21 0029 06/15/21 0513 06/16/21 0835 06/17/21 0735  NA 120* 125*  --  132* 138 135  K 6.9* 5.2* 5.4* 5.0 4.2 3.9  CL 93* 99  --  105 108 107  CO2 17* 18*  --  '24 22 23  '$ GLUCOSE 86 73  --  100* 157* 130*  BUN 108* 105*  --  99* 68* 50*  CREATININE 2.15* 2.23*  --  2.12* 1.71* 1.43*  CALCIUM 8.0* 7.6*  --  8.8* 8.7* 8.4*  MG  --  2.2  --  2.5*  --  1.9  PHOS  --  4.3  --  5.0*   --   --     GFR: Estimated Creatinine Clearance: 29.2 mL/min (A) (by C-G formula based on SCr of 1.43 mg/dL (H)). Recent Labs  Lab 06/14/21 1519 06/15/21 0513 06/16/21 0653 06/17/21 0735  WBC 8.8 8.0 5.3 5.6     Liver Function Tests: Recent Labs  Lab 06/14/21 1155  AST 36  ALT 42  ALKPHOS 95  BILITOT 0.7  PROT 4.8*  ALBUMIN 2.4*    No results for input(s): LIPASE, AMYLASE in the last 168 hours. No results for input(s): AMMONIA in the last 168 hours.  ABG    Component Value Date/Time  HCO3 18.9 (L) 06/14/2021 1155   ACIDBASEDEF 7.7 (H) 06/14/2021 1155   O2SAT 53.9 06/14/2021 1155      Coagulation Profile: Recent Labs  Lab 06/14/21 1155 06/15/21 0513  INR 1.5* 1.3*     Cardiac Enzymes: No results for input(s): CKTOTAL, CKMB, CKMBINDEX, TROPONINI in the last 168 hours.  HbA1C: Hgb A1c MFr Bld  Date/Time Value Ref Range Status  06/04/2021 04:59 PM 5.6 4.8 - 5.6 % Final    Comment:    (NOTE)         Prediabetes: 5.7 - 6.4         Diabetes: >6.4         Glycemic control for adults with diabetes: <7.0     CBG: Recent Labs  Lab 06/16/21 1629 06/16/21 2009 06/17/21 0003 06/17/21 0647 06/17/21 0730  GLUCAP 92 108* 92 76 118*     Review of Systems: Positives in BOLD   Gen: falls, fever, chills, weight change, fatigue, night sweats HEENT: Denies blurred vision, double vision, hearing loss, tinnitus, sinus congestion, rhinorrhea, sore throat, neck stiffness, dysphagia PULM: Denies shortness of breath, cough, sputum production, hemoptysis, wheezing CV: Denies chest pain, edema, orthopnea, paroxysmal nocturnal dyspnea, palpitations GI: Denies abdominal pain, nausea, vomiting, diarrhea, hematochezia, melena, constipation, change in bowel habits GU: Denies dysuria, hematuria, polyuria, oliguria, urethral discharge Endocrine: Denies hot or cold intolerance, polyuria, polyphagia or appetite change Derm: Denies rash, dry skin, scaling or peeling skin  change Heme: Denies easy bruising, bleeding, bleeding gums Neuro: syncope, headache, numbness, weakness, slurred speech, loss of memory or consciousness  Past Medical History:  He,  has a past medical history of CHF (congestive heart failure) ([place]), Chronic kidney disease, Hypertension, Hyponatremia, and Stroke ([place]).   Surgical History:   Past Surgical History:  Procedure Laterality Date   BRAIN SURGERY     CARDIAC SURGERY     ESOPHAGOGASTRODUODENOSCOPY N/A 06/15/2021   Procedure: ESOPHAGOGASTRODUODENOSCOPY (EGD);  Surgeon: Annamaria Helling, DO;  Location: [facility] ENDOSCOPY;  Service: Gastroenterology;  Laterality: N/A;     Social History:   reports that he has never smoked. He has never used smokeless tobacco. He reports that he does not drink alcohol and does not use drugs.   Family History:  His family history includes Cancer in his sister; Heart disease in his sister.   Allergies Allergies  Allergen Reactions   Hydralazine     Pt has severe dizziness with it, and does not want to take it.     Home Medications  Prior to Admission medications   Medication Sig Start Date End Date Taking? Authorizing Provider  amLODipine (NORVASC) 10 MG tablet Take 1 tablet (10 mg total) by mouth daily. 06/07/21   Jennye Boroughs, MD  aspirin EC 81 MG EC tablet Take 1 tablet (81 mg total) by mouth daily. Swallow whole. 03/14/21   Danford, Suann Larry, MD  clopidogrel (PLAVIX) 75 MG tablet Take 75 mg by mouth daily.    [provider]  feeding supplement (ENSURE ENLIVE / ENSURE PLUS) LIQD Take 237 mLs by mouth 2 (two) times daily between meals. 04/14/21   Enzo Bi, MD  furosemide (LASIX) 40 MG tablet Take 1 tablet (40 mg total) by mouth daily. 04/14/21 07/13/21  Enzo Bi, MD  metoprolol succinate (TOPROL-XL) 50 MG 24 hr tablet Take 0.5 tablets (25 mg total) by mouth daily. Take with or immediately following a meal. 06/06/21   Jennye Boroughs, MD  olmesartan (BENICAR) 20 MG tablet Take 20 mg  by  mouth daily.    [provider]  simvastatin (ZOCOR) 80 MG tablet Take 80 mg by mouth daily.    [provider]   Ottie Glazier, M.D.  Pulmonary & Critical Care Medicine

## 2021-06-17 NOTE — Progress Notes (Signed)
PHARMACY CONSULT NOTE - FOLLOW UP  Pharmacy Consult for Electrolyte Monitoring and Replacement   Recent Labs: Potassium (mmol/L)  Date Value  06/17/2021 3.9   Magnesium (mg/dL)  Date Value  06/17/2021 1.9   Calcium (mg/dL)  Date Value  06/17/2021 8.4 (L)   Albumin (g/dL)  Date Value  06/14/2021 2.4 (L)   Phosphorus (mg/dL)  Date Value  06/15/2021 5.0 (H)   Sodium (mmol/L)  Date Value  06/17/2021 135   Corrected Calcium: 8.56  Assessment: Patient is an 87 y/o M with medical history including systolic HF with an EF of 25%, HTN, HLD, CVA, CKD, CAD s/p CABG who was recently admitted for hyponatremia, CHF exacerbation, and bradycardia who is now presenting with syncope. Pharmacy consulted to assist with electrolyte monitoring and replacement as indicated.  Labs notable for acute hyponatremia, hyperkalemia, non-anion gap metabolic acidosis, Scr 0000000 (BL~1.5) and Hgb 7.1 (BL 10)  Goal of Therapy:  WNL  Plan:  Na 135  K 3.9  Mag 1.9  Scr 1.43 No electrolyte replacement at this time  Patient has transferred out of ICU, will discontinue CCM monitoring consult at this time   Noralee Space, PharmD 06/17/2021 9:41 AM

## 2021-06-17 NOTE — Progress Notes (Signed)
Patient ID: Daniel Bolton, male   DOB: 1934/02/17, 87 y.o.   MRN: [id number]  PROGRESS NOTE    Daniel Bolton  [id number] DOB: 30-Nov-1933 DOA: 06/14/2021 PCP: [name], MD   Brief Narrative:  87 year old male with history of chronic kidney disease stage IIIb, CAD status post CABG, hypertension, unspecified stroke, hyperlipidemia, chronic combined systolic and diastolic heart failure, BPH and recent discharge on 06/06/2021 from [facility] following treatment of acute on chronic systolic and diastolic heart failure and hypertensive emergency presented with a fall and was found to have hemorrhagic shock secondary to acute blood loss anemia from GI bleeding.  He was admitted to ICU under PCCM service and GI was consulted.  He was transfused packed red cells.  He was also found to have acute kidney injury; nephrology was consulted.  He had EGD on 06/15/2021 which showed reflux esophagitis/nonbleeding gastric ulcers/erosive gastropathy and nonbleeding duodenal ulcers.  Subsequently, patient's condition stabilized.  GI signed off.  He was transferred to [facility] service from 06/17/2021 onwards.  Assessment & Plan:   Hemorrhagic shock Upper GI bleeding Reflux esophagitis/nonbleeding gastric ulcer/erosive gastropathy/duodenal ulcers Acute blood loss anemia -Status post 3 units packed red cells transfusion during this hospitalization.  Hemoglobin 8.8 today. -Status post EGD on 06/15/2021: Findings as above.  Currently on Protonix 40 mg p.o. twice daily as per GI recommendations. -Aspirin and Plavix on hold: Will reach out to cardiology regarding plan for antiplatelets. -Blood pressure has much improved.  Shock has resolved.  Antihypertensives remain on hold: Might resume on discharge -Care transferred to [facility] service from 06/17/2021 onwards.  Thrombocytopenia -Questionable cause  Hyponatremia -Resolved  Hyperkalemia -Resolved  Acute kidney injury on chronic kidney disease stage  IIIb -Nephrology following.  Currently still on IV fluids.  Creatinine 1.43 today and improving  Acute respite failure with hypoxia -Resolved.  Currently on room air  Acute metabolic encephalopathy -Possibly from GI bleeding and shock.  Mental status much improved.  Generalized conditioning -PT eval  CAD with history of CABG -Currently chest pain-free.  We will follow cardiology recommendations regarding dual antiplatelet use.   DVT prophylaxis: SCDs Code Status: Full Family Communication: None at bedside Disposition Plan: Status is: Inpatient  Remains inpatient appropriate because:Inpatient level of care appropriate due to severity of illness  Dispo: The patient is from: Home              Anticipated d/c is to: Home              Patient currently is not medically stable to d/c.   Difficult to place patient No  Consultants: PCCM/GI  Procedures: EGD on 06/15/2021  Antimicrobials: None   Subjective: Patient seen and examined at bedside.  No overnight fever, vomiting, black or bloody stools reported.  Objective: Vitals:   06/16/21 2008 06/17/21 0055 06/17/21 0647 06/17/21 0729  BP: (!) 151/51 (!) 146/51 (!) 151/55 (!) 155/56  Pulse: 64 64 61 60  Resp: '16 18 18 12  '$ Temp: 98.2 F (36.8 C) 98.2 F (36.8 C) 97.6 F (36.4 C) (!) 97.5 F (36.4 C)  TempSrc: Oral Oral Oral Oral  SpO2: 99% 97% 100% 100%  Weight:      Height:        Intake/Output Summary (Last 24 hours) at 06/17/2021 1013 Last data filed at 06/17/2021 0727 Gross per 24 hour  Intake 240 ml  Output 1475 ml  Net -1235 ml   Filed Weights   06/14/21 1426 06/15/21 0330 06/16/21 0330  Weight: 64.2  kg 58.6 kg 58.6 kg    Examination:  General exam: Appears calm and comfortable.  On room air. Respiratory system: Bilateral decreased breath sounds at bases with scattered crackles Cardiovascular system: S1 & S2 heard, Rate controlled Gastrointestinal system: Abdomen is nondistended, soft and nontender.  Normal bowel sounds heard. Extremities: No cyanosis, clubbing, edema  Central nervous system: Awake.  Answers some questions.  No focal neurological deficits. Moving extremities Skin: No rashes, lesions or ulcers Psychiatry: Affect is mostly flat.   Data Reviewed: I have personally reviewed following labs and imaging studies  CBC: Recent Labs  Lab 06/14/21 0915 06/14/21 1519 06/15/21 0029 06/15/21 0513 06/15/21 1105 06/15/21 1800 06/16/21 0653 06/17/21 0735  WBC 7.6 8.8  --  8.0  --   --  5.3 5.6  NEUTROABS  --  7.9*  --   --   --   --   --  3.5  HGB 7.1* 6.9*   < > 9.5* 9.4* 9.5* 9.2* 8.8*  HCT 20.9* 19.8*   < > 26.4* 26.9* 27.1* 27.0* 25.3*  MCV 88.2 86.8  --  82.2  --   --  84.1 85.5  PLT 132* 110*  --  113*  --   --  120* 118*   < > = values in this interval not displayed.   Basic Metabolic Panel: Recent Labs  Lab 06/14/21 0915 06/14/21 1155 06/15/21 0029 06/15/21 0513 06/16/21 0835 06/17/21 0735  NA 120* 125*  --  132* 138 135  K 6.9* 5.2* 5.4* 5.0 4.2 3.9  CL 93* 99  --  105 108 107  CO2 17* 18*  --  '24 22 23  '$ GLUCOSE 86 73  --  100* 157* 130*  BUN 108* 105*  --  99* 68* 50*  CREATININE 2.15* 2.23*  --  2.12* 1.71* 1.43*  CALCIUM 8.0* 7.6*  --  8.8* 8.7* 8.4*  MG  --  2.2  --  2.5*  --  1.9  PHOS  --  4.3  --  5.0*  --   --    GFR: Estimated Creatinine Clearance: 29.2 mL/min (A) (by C-G formula based on SCr of 1.43 mg/dL (H)). Liver Function Tests: Recent Labs  Lab 06/14/21 1155  AST 36  ALT 42  ALKPHOS 95  BILITOT 0.7  PROT 4.8*  ALBUMIN 2.4*   No results for input(s): LIPASE, AMYLASE in the last 168 hours. No results for input(s): AMMONIA in the last 168 hours. Coagulation Profile: Recent Labs  Lab 06/14/21 1155 06/15/21 0513  INR 1.5* 1.3*   Cardiac Enzymes: No results for input(s): CKTOTAL, CKMB, CKMBINDEX, TROPONINI in the last 168 hours. BNP (last 3 results) No results for input(s): PROBNP in the last 8760 hours. HbA1C: No results  for input(s): HGBA1C in the last 72 hours. CBG: Recent Labs  Lab 06/16/21 1629 06/16/21 2009 06/17/21 0003 06/17/21 0647 06/17/21 0730  GLUCAP 92 108* 92 76 118*   Lipid Profile: No results for input(s): CHOL, HDL, LDLCALC, TRIG, CHOLHDL, LDLDIRECT in the last 72 hours. Thyroid Function Tests: No results for input(s): TSH, T4TOTAL, FREET4, T3FREE, THYROIDAB in the last 72 hours. Anemia Panel: No results for input(s): VITAMINB12, FOLATE, FERRITIN, TIBC, IRON, RETICCTPCT in the last 72 hours. Sepsis Labs: No results for input(s): PROCALCITON, LATICACIDVEN in the last 168 hours.  Recent Results (from the past 240 hour(s))  Resp Panel by RT-PCR (Flu A&B, Covid) Nasopharyngeal Swab     Status: None   Collection Time: 06/14/21  9:38  AM   Specimen: Nasopharyngeal Swab; Nasopharyngeal(NP) swabs in vial transport medium  Result Value Ref Range Status   SARS Coronavirus 2 by RT PCR NEGATIVE NEGATIVE Final    Comment: (NOTE) SARS-CoV-2 target nucleic acids are NOT DETECTED.  The SARS-CoV-2 RNA is generally detectable in upper respiratory specimens during the acute phase of infection. The lowest concentration of SARS-CoV-2 viral copies this assay can detect is 138 copies/mL. A negative result does not preclude SARS-Cov-2 infection and should not be used as the sole basis for treatment or other patient management decisions. A negative result may occur with  improper specimen collection/handling, submission of specimen other than nasopharyngeal swab, presence of viral mutation(s) within the areas targeted by this assay, and inadequate number of viral copies(<138 copies/mL). A negative result must be combined with clinical observations, patient history, and epidemiological information. The expected result is Negative.  Fact Sheet for Patients:  EntrepreneurPulse.com.au  Fact Sheet for Healthcare Providers:  IncredibleEmployment.be  This test is no t  yet approved or cleared by the [place] FDA and  has been authorized for detection and/or diagnosis of SARS-CoV-2 by FDA under an Emergency Use Authorization (EUA). This EUA will remain  in effect (meaning this test can be used) for the duration of the COVID-19 declaration under Section 564(b)(1) of the Act, 21 U.S.C.section 360bbb-3(b)(1), unless the authorization is terminated  or revoked sooner.       Influenza A by PCR NEGATIVE NEGATIVE Final   Influenza B by PCR NEGATIVE NEGATIVE Final    Comment: (NOTE) The Xpert Xpress SARS-CoV-2/FLU/RSV plus assay is intended as an aid in the diagnosis of influenza from Nasopharyngeal swab specimens and should not be used as a sole basis for treatment. Nasal washings and aspirates are unacceptable for Xpert Xpress SARS-CoV-2/FLU/RSV testing.  Fact Sheet for Patients: EntrepreneurPulse.com.au  Fact Sheet for Healthcare Providers: IncredibleEmployment.be  This test is not yet approved or cleared by the [place] FDA and has been authorized for detection and/or diagnosis of SARS-CoV-2 by FDA under an Emergency Use Authorization (EUA). This EUA will remain in effect (meaning this test can be used) for the duration of the COVID-19 declaration under Section 564(b)(1) of the Act, 21 U.S.C. section 360bbb-3(b)(1), unless the authorization is terminated or revoked.  Performed at [facility], [address] [place], [place] [address]   MRSA Next Gen by PCR, Nasal     Status: None   Collection Time: 06/14/21  3:20 PM   Specimen: Nasal Mucosa; Nasal Swab  Result Value Ref Range Status   MRSA by PCR Next Gen NOT DETECTED NOT DETECTED Final    Comment: (NOTE) The GeneXpert MRSA Assay (FDA approved for NASAL specimens only), is one component of a comprehensive MRSA colonization surveillance program. It is not intended to diagnose MRSA infection nor to guide or monitor treatment for MRSA  infections. Test performance is not FDA approved in patients less than 14 years old. Performed at [facility], [address] [place], [place] [address]          Radiology Studies: No results found.      Scheduled Meds:  Chlorhexidine Gluconate Cloth  6 each Topical Daily   insulin aspart  0-15 Units Subcutaneous TID WC   insulin aspart  0-5 Units Subcutaneous QHS   pantoprazole  40 mg Oral BID   Continuous Infusions:  sodium chloride     sodium chloride Stopped (06/15/21 1946)   lactated ringers Stopped (06/14/21 1925)  Aline August, MD [facility] 06/17/2021, 10:13 AM

## 2021-06-17 NOTE — Care Management Important Message (Signed)
Important Message  Patient Details  Name: Daniel Bolton MRN: [id number] Date of Birth: 01/24/1934   Medicare Important Message Given:  Yes     [name] 06/17/2021, 11:38 AM

## 2021-06-17 NOTE — Plan of Care (Signed)

## 2021-06-17 NOTE — Evaluation (Signed)
.ptevalhysical Therapy Evaluation Patient Details Name: Daniel Bolton MRN: [id number] DOB: [birth year] Today's Date: 06/17/2021  History of Present Illness  Daniel Bolton is an 38yoM who comes to [facility] on 9/9 after syncope and collapse. Pt reports several recent falls. Pt here 9 days prior for CHF exacerbation. PMH: CKD3b, CAD, HTN, constipation, CVA, HLD, AAA, combined CHF, BPH, CABG. Pt s/p 9/10 endoscopy finding duodenal and gastric ulcers. Improved mentation on 9/11, transfered to [facility] services on 9/12.  Clinical Impression  Pt admitted with above diagnosis. Pt currently with functional limitations due to the deficits listed below (see "PT Problem List"). Upon entry, pt in bed, awake, and agreeable to participate. The pt is alert, pleasant, interactive, and able to provide info regarding prior level of function, both in tolerance and independence. Pt dons socks, moves to EOB, rises to standing and AMB nealry 46f all with minGuard assist or less. His gait speed and balance are fairly good, near baseline per his report. It is not clear that he needs HHPT at this time. Patient's performance this date reveals decreased  independence and tolerance in performing basic mobility required for performance of activities of daily living, mostly by subacute posterior pelvis and leg pain. Pt requires additional DME, close physical assistance, and cues for safe participate in mobility. Pt encouraged to FU with PCP regarding his new posterior pelvis/leg pain. Pt will benefit from skilled PT intervention to increase independence and safety with basic mobility in preparation for discharge to the venue listed below.        Recommendations for follow up therapy are one component of a multi-disciplinary discharge planning process, led by the attending physician.  Recommendations may be updated based on patient status, additional functional criteria and insurance authorization.  Follow Up Recommendations  Supervision - Intermittent    Equipment Recommendations  None recommended by PT    Recommendations for Other Services       Precautions / Restrictions Precautions Precautions: Fall Restrictions Weight Bearing Restrictions: No      Mobility  Bed Mobility Overal bed mobility: Independent                  Transfers Overall transfer level: Needs assistance Equipment used: None Transfers: Sit to/from Stand Sit to Stand: Supervision            Ambulation/Gait Ambulation/Gait assistance: Min guard;Supervision Gait Distance (Feet): 380 Feet Assistive device: None Gait Pattern/deviations: Step-to pattern Gait velocity: 1.063m   General Gait Details: no LOB with turns, mild periodic sway due to pelvis pain  Stairs            Wheelchair Mobility    Modified Rankin (Stroke Patients Only)       Balance Overall balance assessment: Mild deficits observed, not formally tested;Modified Independent                                           Pertinent Vitals/Pain Pain Assessment: Faces Faces Pain Scale: Hurts little more Pain Location: bilat SIJ area, left calf Pain Intervention(s): Limited activity within patient's tolerance;Monitored during session    HoFarinaxpects to be discharged to:: Private residence Living Arrangements: Alone Available Help at Discharge: Family;Available PRN/intermittently (4 DTR 1 son) Type of Home: Mobile home Home Access: Stairs to enter Entrance Stairs-Rails: Right Entrance Stairs-Number of Steps: 6 Home Layout: One level Home Equipment: Cane - single point;Walker -  2 wheels Additional Comments: lives alone, family able to assist as needed.    Prior Function Level of Independence: Needs assistance   Gait / Transfers Assistance Needed: intermittent use of assistive device for household amb. Endorses 2 falls within past 6 months.  ADL's / Homemaking Assistance Needed: Independent ADL  performance, family helps with IADL        Hand Dominance        Extremity/Trunk Assessment                Communication   Communication: HOH;Interpreter utilized  Cognition Arousal/Alertness: Awake/alert Behavior During Therapy: WFL for tasks assessed/performed Overall Cognitive Status: Within Functional Limits for tasks assessed                                        General Comments      Exercises     Assessment/Plan    PT Assessment Patient needs continued PT services  PT Problem List Decreased strength;Decreased activity tolerance;Decreased safety awareness;Decreased balance;Cardiopulmonary status limiting activity;Decreased mobility       PT Treatment Interventions Gait training;Balance training;Stair training;Neuromuscular re-education;Functional mobility training;Patient/family education;Therapeutic activities;Therapeutic exercise    PT Goals (Current goals can be found in the Care Plan section)  Acute Rehab PT Goals Patient Stated Goal: return to baseline activity tolerance PT Goal Formulation: With patient Time For Goal Achievement: 07/01/21 Potential to Achieve Goals: Good    Frequency Min 2X/week   Barriers to discharge        Co-evaluation               AM-PAC PT "6 Clicks" Mobility  Outcome Measure Help needed turning from your back to your side while in a flat bed without using bedrails?: None Help needed moving from lying on your back to sitting on the side of a flat bed without using bedrails?: None Help needed moving to and from a bed to a chair (including a wheelchair)?: A Little Help needed standing up from a chair using your arms (e.g., wheelchair or bedside chair)?: A Little Help needed to walk in hospital room?: A Little Help needed climbing 3-5 steps with a railing? : A Little 6 Click Score: 20    End of Session Equipment Utilized During Treatment: Gait belt Activity Tolerance: Patient tolerated  treatment well;No increased pain Patient left: in bed;with call bell/phone within reach;with family/visitor present   PT Visit Diagnosis: Unsteadiness on feet (R26.81);Difficulty in walking, not elsewhere classified (R26.2);History of falling (Z91.81);Muscle weakness (generalized) (M62.81)    Time: [id number] PT Time Calculation (min) (ACUTE ONLY): 22 min   Charges:   PT Evaluation $PT Eval Low Complexity: 1 Low         10:38 AM, 06/17/21 Etta Grandchild, PT, DPT Physical Therapist - [facility]  [phone] ([place])    [place] C 06/17/2021, 10:35 AM

## 2021-06-17 NOTE — Progress Notes (Signed)
Patient refusing bed alarm and safety assistance measures. Patient states he is "fine," and can "walk fine." Educated via Optometrist on safety precautions.

## 2021-06-17 NOTE — Progress Notes (Signed)
[facility]  ROUNDING NOTE   Subjective:   No complaints. Transferred out of ICU.  Creatinine 1.43 (1.71) UOP 1074m  Objective:  Vital signs in last 24 hours:  Temp:  [97.5 F (36.4 C)-98.2 F (36.8 C)] 98 F (36.7 C) (09/12 1237) Pulse Rate:  [60-64] 62 (09/12 1237) Resp:  [12-18] 18 (09/12 1237) BP: (143-155)/(51-60) 146/60 (09/12 1237) SpO2:  [97 %-100 %] 99 % (09/12 1237)  Weight change:  Filed Weights   06/14/21 1426 06/15/21 0330 06/16/21 0330  Weight: 64.2 kg 58.6 kg 58.6 kg    Intake/Output: I/O last 3 completed shifts: In: 240 [P.O.:240] Out: [id number][Urine:3475]   Intake/Output this shift:  Total I/O In: 240 [P.O.:240] Out: 650 [Urine:650]  Physical Exam: General: No acute distress, sitting up in bed  Head: Normocephalic, atraumatic. Moist oral mucosal membranes  Eyes: Anicteric  Neck: Supple  Lungs:  Clear to auscultation, normal effort  Heart: regular  Abdomen:  Soft, nontender, bowel sounds present  Extremities: No peripheral edema.  Neurologic: Awake, alert, following commands  Skin: No acute rash  Access: No hemodialysis access    Basic Metabolic Panel: Recent Labs  Lab 06/14/21 0915 06/14/21 1155 06/15/21 0029 06/15/21 0513 06/16/21 0835 06/17/21 0735  NA 120* 125*  --  132* 138 135  K 6.9* 5.2* 5.4* 5.0 4.2 3.9  CL 93* 99  --  105 108 107  CO2 17* 18*  --  '24 22 23  '$ GLUCOSE 86 73  --  100* 157* 130*  BUN 108* 105*  --  99* 68* 50*  CREATININE 2.15* 2.23*  --  2.12* 1.71* 1.43*  CALCIUM 8.0* 7.6*  --  8.8* 8.7* 8.4*  MG  --  2.2  --  2.5*  --  1.9  PHOS  --  4.3  --  5.0*  --   --      Liver Function Tests: Recent Labs  Lab 06/14/21 1155  AST 36  ALT 42  ALKPHOS 95  BILITOT 0.7  PROT 4.8*  ALBUMIN 2.4*    No results for input(s): LIPASE, AMYLASE in the last 168 hours. No results for input(s): AMMONIA in the last 168 hours.  CBC: Recent Labs  Lab 06/14/21 0915 06/14/21 1519 06/15/21 0029  06/15/21 0513 06/15/21 1105 06/15/21 1800 06/16/21 0653 06/17/21 0735  WBC 7.6 8.8  --  8.0  --   --  5.3 5.6  NEUTROABS  --  7.9*  --   --   --   --   --  3.5  HGB 7.1* 6.9*   < > 9.5* 9.4* 9.5* 9.2* 8.8*  HCT 20.9* 19.8*   < > 26.4* 26.9* 27.1* 27.0* 25.3*  MCV 88.2 86.8  --  82.2  --   --  84.1 85.5  PLT 132* 110*  --  113*  --   --  120* 118*   < > = values in this interval not displayed.     Cardiac Enzymes: No results for input(s): CKTOTAL, CKMB, CKMBINDEX, TROPONINI in the last 168 hours.  BNP: Invalid input(s): POCBNP  CBG: Recent Labs  Lab 06/16/21 2009 06/17/21 0003 06/17/21 0647 06/17/21 0730 06/17/21 1133  GLUCAP 108* 92 76 118* 147*     Microbiology: Results for orders placed or performed during the hospital encounter of 06/14/21  Resp Panel by RT-PCR (Flu A&B, Covid) Nasopharyngeal Swab     Status: None   Collection Time: 06/14/21  9:38 AM   Specimen: Nasopharyngeal Swab; Nasopharyngeal(NP)  swabs in vial transport medium  Result Value Ref Range Status   SARS Coronavirus 2 by RT PCR NEGATIVE NEGATIVE Final    Comment: (NOTE) SARS-CoV-2 target nucleic acids are NOT DETECTED.  The SARS-CoV-2 RNA is generally detectable in upper respiratory specimens during the acute phase of infection. The lowest concentration of SARS-CoV-2 viral copies this assay can detect is 138 copies/mL. A negative result does not preclude SARS-Cov-2 infection and should not be used as the sole basis for treatment or other patient management decisions. A negative result may occur with  improper specimen collection/handling, submission of specimen other than nasopharyngeal swab, presence of viral mutation(s) within the areas targeted by this assay, and inadequate number of viral copies(<138 copies/mL). A negative result must be combined with clinical observations, patient history, and epidemiological information. The expected result is Negative.  Fact Sheet for Patients:   EntrepreneurPulse.com.au  Fact Sheet for Healthcare Providers:  IncredibleEmployment.be  This test is no t yet approved or cleared by the [place] FDA and  has been authorized for detection and/or diagnosis of SARS-CoV-2 by FDA under an Emergency Use Authorization (EUA). This EUA will remain  in effect (meaning this test can be used) for the duration of the COVID-19 declaration under Section 564(b)(1) of the Act, 21 U.S.C.section 360bbb-3(b)(1), unless the authorization is terminated  or revoked sooner.       Influenza A by PCR NEGATIVE NEGATIVE Final   Influenza B by PCR NEGATIVE NEGATIVE Final    Comment: (NOTE) The Xpert Xpress SARS-CoV-2/FLU/RSV plus assay is intended as an aid in the diagnosis of influenza from Nasopharyngeal swab specimens and should not be used as a sole basis for treatment. Nasal washings and aspirates are unacceptable for Xpert Xpress SARS-CoV-2/FLU/RSV testing.  Fact Sheet for Patients: EntrepreneurPulse.com.au  Fact Sheet for Healthcare Providers: IncredibleEmployment.be  This test is not yet approved or cleared by the [place] FDA and has been authorized for detection and/or diagnosis of SARS-CoV-2 by FDA under an Emergency Use Authorization (EUA). This EUA will remain in effect (meaning this test can be used) for the duration of the COVID-19 declaration under Section 564(b)(1) of the Act, 21 U.S.C. section 360bbb-3(b)(1), unless the authorization is terminated or revoked.  Performed at [facility], [address] [place], [place] [address]   MRSA Next Gen by PCR, Nasal     Status: None   Collection Time: 06/14/21  3:20 PM   Specimen: Nasal Mucosa; Nasal Swab  Result Value Ref Range Status   MRSA by PCR Next Gen NOT DETECTED NOT DETECTED Final    Comment: (NOTE) The GeneXpert MRSA Assay (FDA approved for NASAL specimens only), is one component of a  comprehensive MRSA colonization surveillance program. It is not intended to diagnose MRSA infection nor to guide or monitor treatment for MRSA infections. Test performance is not FDA approved in patients less than 77 years old. Performed at [facility], [address] [place],  [address]     Coagulation Studies: Recent Labs    06/15/21 0513  LABPROT 15.9*  INR 1.3*     Urinalysis: Recent Labs    06/14/21 1347  COLORURINE STRAW*  LABSPEC <1.005*  PHURINE 5.5  GLUCOSEU NEGATIVE  HGBUR NEGATIVE  BILIRUBINUR NEGATIVE  KETONESUR NEGATIVE  PROTEINUR NEGATIVE  NITRITE NEGATIVE  LEUKOCYTESUR NEGATIVE       Imaging: No results found.   Medications:    sodium chloride     sodium chloride Stopped (06/15/21 1946)   lactated  ringers Stopped (06/14/21 1925)    Chlorhexidine Gluconate Cloth  6 each Topical Daily   insulin aspart  0-15 Units Subcutaneous TID WC   insulin aspart  0-5 Units Subcutaneous QHS   pantoprazole  40 mg Oral BID   acetaminophen, ipratropium-albuterol, ondansetron (ZOFRAN) IV  Assessment/ Plan:   Mr. Tanush Debell is a 87 y.o. Hispanic male with hypertension, coronary artery disease, congestive heart failure, CVA, who is admitted to [facility] on 06/14/2021 for Hyperkalemia [E87.5] Confusion [R41.0] Hyponatremia [E87.1] Aortic aneurysm, abdominal (HCC) [I71.4] Acute GI bleeding [K92.2] AKI (acute kidney injury) ([place]) [N17.9] Gastrointestinal hemorrhage with melena [K92.1]  Acute kidney injury with hyperkalemia on chronic kidney disease stage IIIB: with proteinuria: history of nephrotic range proteinuria. Baseline creatinine of 1.8, GFR of 36 on 12/17/20. Chronic kidney disease secondary to hypertension. Acute kidney injury secondary to ATN. Creatinine now back to baseline.  Currently off his ACE-I/ARB. Followed by Dr. Lanora Manis as outpatient.  - discontinue IV fluids  Hypertension: 146/60. Holding home blood pressure agents.    Anemia with acute blood loss and kidney injury: secondary to GI bleed from peptic ulcer disease. Status post multiple PRBC transfusions this admission.   Hyponatremia: secondary to acute kidney injury. Improved with IV fluids.    LOS: 3 Daniel Bolton 9/12/202212:51 PM

## 2021-06-18 DIAGNOSIS — R578 Other shock: Secondary | ICD-10-CM | POA: Diagnosis not present

## 2021-06-18 DIAGNOSIS — K922 Gastrointestinal hemorrhage, unspecified: Secondary | ICD-10-CM | POA: Diagnosis not present

## 2021-06-18 LAB — CBC WITH DIFFERENTIAL/PLATELET
Abs Immature Granulocytes: 0.02 10*3/uL (ref 0.00–0.07)
Basophils Absolute: 0 10*3/uL (ref 0.0–0.1)
Basophils Relative: 1 %
Eosinophils Absolute: 0.2 10*3/uL (ref 0.0–0.5)
Eosinophils Relative: 4 %
HCT: 26.2 % — ABNORMAL LOW (ref 39.0–52.0)
Hemoglobin: 8.9 g/dL — ABNORMAL LOW (ref 13.0–17.0)
Immature Granulocytes: 0 %
Lymphocytes Relative: 25 %
Lymphs Abs: 1.4 10*3/uL (ref 0.7–4.0)
MCH: 29.2 pg (ref 26.0–34.0)
MCHC: 34 g/dL (ref 30.0–36.0)
MCV: 85.9 fL (ref 80.0–100.0)
Monocytes Absolute: 0.4 10*3/uL (ref 0.1–1.0)
Monocytes Relative: 8 %
Neutro Abs: 3.5 10*3/uL (ref 1.7–7.7)
Neutrophils Relative %: 62 %
Platelets: 125 10*3/uL — ABNORMAL LOW (ref 150–400)
RBC: 3.05 MIL/uL — ABNORMAL LOW (ref 4.22–5.81)
RDW: 15.5 % (ref 11.5–15.5)
WBC: 5.6 10*3/uL (ref 4.0–10.5)
nRBC: 0 % (ref 0.0–0.2)

## 2021-06-18 LAB — BASIC METABOLIC PANEL
Anion gap: 6 (ref 5–15)
BUN: 37 mg/dL — ABNORMAL HIGH (ref 8–23)
CO2: 22 mmol/L (ref 22–32)
Calcium: 8.2 mg/dL — ABNORMAL LOW (ref 8.9–10.3)
Chloride: 106 mmol/L (ref 98–111)
Creatinine, Ser: 1.3 mg/dL — ABNORMAL HIGH (ref 0.61–1.24)
GFR, Estimated: 54 mL/min — ABNORMAL LOW (ref >60)
Glucose, Bld: 83 mg/dL (ref 70–99)
Potassium: 3.8 mmol/L (ref 3.5–5.1)
Sodium: 134 mmol/L — ABNORMAL LOW (ref 135–145)

## 2021-06-18 LAB — MAGNESIUM: Magnesium: 1.7 mg/dL (ref 1.7–2.4)

## 2021-06-18 LAB — GLUCOSE, CAPILLARY: Glucose-Capillary: 104 mg/dL — ABNORMAL HIGH (ref 70–99)

## 2021-06-18 LAB — SURGICAL PATHOLOGY

## 2021-06-18 MED ORDER — PANTOPRAZOLE SODIUM 40 MG PO TBEC: 40.0000 mg | DELAYED_RELEASE_TABLET | Freq: Two times a day (BID) | ORAL | 0 refills | Status: DC

## 2021-06-18 NOTE — Discharge Summary (Signed)
Physician Discharge Summary  Kemarrion Okray [facility] [id number] DOB: [birth year] DOA: 06/14/2021  PCP: [name], MD  Admit date: 06/14/2021 Discharge date: 06/18/2021  Admitted From: Home Disposition: Home  Recommendations for Outpatient Follow-up:  Follow up with PCP in 1 week with repeat CBC/BMP Outpatient follow-up with gastroenterology and cardiology.  Hold aspirin and Plavix till reevaluated by cardiology in the office. Follow up in ED if symptoms worsen or new appear   Home Health: PT/OT/RN  equipment/Devices: None  Discharge Condition: Stable CODE STATUS: Full Diet recommendation: Heart healthy  Brief/Interim Summary: 87 year old male with history of chronic kidney disease stage IIIb, CAD status post CABG, hypertension, unspecified stroke, hyperlipidemia, chronic combined systolic and diastolic heart failure, BPH and recent discharge on 06/06/2021 from [facility] following treatment of acute on chronic systolic and diastolic heart failure and hypertensive emergency presented with a fall and was found to have hemorrhagic shock secondary to acute blood loss anemia from GI bleeding.  He was admitted to ICU under PCCM service and GI was consulted.  He was transfused packed red cells.  He was also found to have acute kidney injury; nephrology was consulted.  He had EGD on 06/15/2021 which showed reflux esophagitis/nonbleeding gastric ulcers/erosive gastropathy and nonbleeding duodenal ulcers.  Subsequently, patient's condition stabilized.  GI signed off.  He was transferred to [facility] service from 06/17/2021 onwards.  Subsequently, he has remained stable, tolerating diet.  He will be discharged home today with outpatient follow-up with PCP/cardiology/GI.    Discharge Diagnoses:   Hemorrhagic shock Upper GI bleeding Reflux esophagitis/nonbleeding gastric ulcer/erosive gastropathy/duodenal ulcers Acute blood loss anemia -Status post 3 units packed red cells transfusion during this  hospitalization.  Hemoglobin 8.9 today. -Status post EGD on 06/15/2021: Findings as above.  Currently on Protonix 40 mg p.o. twice daily as per GI recommendations. -Aspirin and Plavix will remain on hold till reevaluation by cardiology as an outpatient. -Blood pressure has much improved.  Shock has resolved.   -Discharge patient home today on Protonix 40 mg twice a day.  GI recommended to continue PPI for 8 weeks with repeat EGD in 8 weeks if needed.  - Outpatient follow-up with PCP and GI.  Repeat CBC within a week and follow-up with PCP.  Thrombocytopenia -Questionable cause.  Stable.  Hyponatremia -Mild.  Outpatient follow-up.  Hyperkalemia -Resolved   Acute kidney injury on chronic kidney disease stage IIIb -Nephrology following.  Treated with IV fluids.  Creatinine 1.3 today.  Outpatient follow-up.  Acute respiratory failure with hypoxia -Resolved.  Currently on room air  Acute metabolic encephalopathy -Possibly from GI bleeding and shock.  Mental status much improved.   Generalized conditioning -Will need home health PT/OT   CAD with history of CABG -Currently chest pain-free.  Communicated with Dr. Kowalski/cardiology via secure chat who recommended to hold aspirin and Plavix till reevaluation by cardiology as an outpatient. Discharge Instructions  Discharge Instructions     Diet - low sodium heart healthy   Complete by: As directed    Increase activity slowly   Complete by: As directed       Allergies as of 06/18/2021       Reactions   Hydralazine    Pt has severe dizziness with it, and does not want to take it.        Medication List     STOP taking these medications    aspirin 81 MG EC tablet   clopidogrel 75 MG tablet Commonly known as: PLAVIX  TAKE these medications    amLODipine 10 MG tablet Commonly known as: NORVASC Take 1 tablet (10 mg total) by mouth daily.   feeding supplement Liqd Take 237 mLs by mouth 2 (two) times daily  between meals.   furosemide 40 MG tablet Commonly known as: LASIX Take 1 tablet (40 mg total) by mouth daily.   metoprolol succinate 50 MG 24 hr tablet Commonly known as: TOPROL-XL Take 0.5 tablets (25 mg total) by mouth daily. Take with or immediately following a meal.   olmesartan 20 MG tablet Commonly known as: BENICAR Take 20 mg by mouth daily.   pantoprazole 40 MG tablet Commonly known as: PROTONIX Take 1 tablet (40 mg total) by mouth 2 (two) times daily.   simvastatin 80 MG tablet Commonly known as: ZOCOR Take 80 mg by mouth daily.        Follow-up Information     [name], MD. Go on 06/24/2021.   Specialty: Family Medicine Why: @ 11:30 am                         with cbc/bmp Contact information: [address] [place] [address] [phone]         Annamaria Helling, DO. Go in 1 week(s).   Why: Meghan (at office) sated they will call patient to set up follow up appt Contact information: [facility] [place] [address] [phone]         Teodoro Spray, MD. Go on 07/10/2021.   Specialty: Cardiology Why: @ 4pm Contact information: [place] [address] [phone]                Allergies  Allergen Reactions   Hydralazine     Pt has severe dizziness with it, and does not want to take it.    Consultations: PCCM/GI.  Communicated with Dr. Montine Circle via secure chat   Procedures/Studies: CT ABDOMEN PELVIS WO CONTRAST  Result Date: 06/04/2021 CLINICAL DATA:  Abdominal distention for 3-4 days. EXAM: CT ABDOMEN AND PELVIS WITHOUT CONTRAST TECHNIQUE: Multidetector CT imaging of the abdomen and pelvis was performed following the standard protocol without IV contrast. COMPARISON:  12/11/2020 FINDINGS: Lower chest: Moderate right and small left pleural effusions. Hepatobiliary: No focal abnormality in the liver on this study without intravenous contrast. 12 mm calcified gallstone  again noted. No intrahepatic or extrahepatic biliary dilation. Pancreas: No focal mass lesion. No dilatation of the main duct. No intraparenchymal cyst. Peripancreatic fat in the region of the pancreatic head is not well defined. Spleen: No splenomegaly. No focal mass lesion. Adrenals/Urinary Tract: No adrenal nodule or mass. Kidneys unremarkable. No evidence for hydroureter. The urinary bladder appears normal for the degree of distention. Stomach/Bowel: Stomach is unremarkable. No gastric wall thickening. No evidence of outlet obstruction. Duodenum is normally positioned as is the ligament of Treitz. Probable subtle periduodenal edema. No small bowel wall thickening. No small bowel dilatation. The terminal ileum is normal. The appendix is not well visualized, but there is no edema or inflammation in the region of the cecum. No gross colonic mass. No colonic wall thickening. Vascular/Lymphatic: Abdominal aortic aneurysm measures up to 4.4 cm diameter on today's study. Loss of fat plane between the duodenum in the aorta noted on today's exam. There is no gastrohepatic or hepatoduodenal ligament lymphadenopathy. No retroperitoneal or mesenteric lymphadenopathy. No pelvic sidewall lymphadenopathy. Reproductive: Prostate gland is mildly enlarged. Other: Trace free fluid noted in the  abdomen and pelvis. Musculoskeletal: Diffuse body wall edema. No worrisome lytic or sclerotic osseous abnormality. IMPRESSION: 1. Fat planes in the retroperitoneal space of the upper abdomen are not well preserved on today's study. As such, Peri duodenal or peripancreatic edema/inflammation would be a consideration, for example in the setting of duodenitis or pancreatitis. 2. Infrarenal abdominal aortic aneurysm measuring up to 4.4 cm today, increased from 4.1 cm on the 12/11/2020 exam. Haziness in the retroperitoneal fat extends around the aorta and fat plane between the anterior wall the aorta and the duodenum is not preserved on today's  exam. In the setting of GI bleeding, aorto enteric fistula could have this appearance. Recommend follow-up every 12 months and vascular consultation. This recommendation follows ACR consensus guidelines: White Paper of the ACR Incidental Findings Committee II on Vascular Findings. J Am Coll Radiol 2013; 10:789-794. 3. Cholelithiasis. 4. Similar bilateral pleural effusions, right greater than left. 5.  Aortic Atherosclerois (ICD10-170.0) Electronically Signed   By: Misty Stanley M.D.   On: 06/04/2021 07:23   DG Chest 2 View  Result Date: 06/03/2021 CLINICAL DATA:  Shortness of breath EXAM: CHEST - 2 VIEW COMPARISON:  04/09/2021 FINDINGS: Cardiomegaly with mild interstitial edema. Small right pleural effusion. No pneumothorax. Postsurgical changes related to prior CABG. Thoracic aortic atherosclerosis. Median sternotomy. IMPRESSION: Cardiomegaly with mild interstitial edema and small right pleural effusion. Electronically Signed   By: Julian Hy M.D.   On: 06/03/2021 23:32   DG Abdomen 1 View  Result Date: 06/03/2021 CLINICAL DATA:  Abdominal distension EXAM: ABDOMEN - 1 VIEW COMPARISON:  None. FINDINGS: Nonobstructive bowel gas pattern. No evidence of free air under the diaphragm. Normal colonic stool burden. Degenerative changes of the lumbar spine. IMPRESSION: Negative. Electronically Signed   By: Julian Hy M.D.   On: 06/03/2021 23:34   CT HEAD WO CONTRAST (5MM)  Result Date: 06/14/2021 CLINICAL DATA:  Trauma EXAM: CT HEAD WITHOUT CONTRAST TECHNIQUE: Contiguous axial images were obtained from the base of the skull through the vertex without intravenous contrast. COMPARISON:  CT head 05/16/2021, brain MRI 01/18/2008 FINDINGS: Brain: There is no evidence of acute intracranial hemorrhage, extra-axial fluid collection, or acute infarct. Global parenchymal volume loss is unchanged. 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 The ventricles are  stable in size. There is no mass lesion. There is no midline shift. Vascular: There is calcification of the bilateral cavernous ICAs. Skull: Deformity of the frontal calvarium is unchanged since 2009. There is no evidence of acute fracture. There is no suspicious osseous lesion. Sinuses/Orbits: The paranasal sinuses are clear. The globes and orbits are unremarkable. Other: None. IMPRESSION: 1. No acute intracranial hemorrhage or calvarial fracture. 2. Stable remote infarcts as above. Electronically Signed   By: Valetta Mole M.D.   On: 06/14/2021 11:45   US AORTA DUPLEX LIMITED  Result Date: 06/14/2021 CLINICAL DATA:  Abdominal aortic aneurysm EXAM: ULTRASOUND OF ABDOMINAL AORTA TECHNIQUE: Ultrasound examination of the abdominal aorta and proximal common iliac arteries was performed to evaluate for aneurysm. Additional color and Doppler images of the distal aorta were obtained to document patency. COMPARISON:  06/04/2021 FINDINGS: Abdominal aortic measurements as follows: Proximal:  2.3 x 2.3 cm Mid:  2.3 x 2.7 cm Distal:  4.3 x 4.7 cm, significant atheromatous plaque Patent: Yes, peak systolic velocity is 0000000 cm/s Right common iliac artery: Not visualized due to bowel gas. Left common iliac artery: Not visualized due to bowel gas. IMPRESSION: 1. Infrarenal abdominal aortic aneurysm measuring  4.3 x 4.7 cm. Recommend follow-up CT/MR every 6 months and vascular consultation. This recommendation follows ACR consensus guidelines: White Paper of the ACR Incidental Findings Committee II on Vascular Findings. J Am Coll Radiol 2013; 10:789-794. Electronically Signed   By: Randa Ngo M.D.   On: 06/14/2021 19:38   CT C SPINE LTD WO OR W CONTRAST  Result Date: 06/14/2021 CLINICAL DATA:  Fall EXAM: CT CERVICAL SPINE WITHOUT CONTRAST TECHNIQUE: Multidetector CT imaging of the cervical spine was performed without intravenous contrast. Multiplanar CT image reconstructions were also generated. COMPARISON:  CT cervical  spine 05/16/2021 FINDINGS: Image quality is degraded by motion artifact. Additionally, the cervical spine is only image through the C6 vertebral body. Alignment: There is trace anterolisthesis of C4 on C5, unchanged. There is slight focal kyphosis centered at C6. There is no traumatic malalignment to the level imaged. There is no jumped or perched facet. Skull base and vertebrae: Skull base alignment is within normal limits. Vertebral body heights are preserved. There is no evidence of acute fracture. Soft tissues and spinal canal: No prevertebral fluid or swelling. No visible canal hematoma. Disc levels: There is bulky uncovertebral arthropathy at C5-C6. There is multilevel neural foraminal stenosis, unchanged. The osseous spinal canal is patent. Upper chest: Not imaged Other: None. IMPRESSION: 1. Image quality is degraded by motion artifact, and the cervical spine is only imaged through the C6 vertebral body. 2. Within these confines, no acute fracture or traumatic malalignment of the level imaged. Electronically Signed   By: Valetta Mole M.D.   On: 06/14/2021 12:33   DG Chest Portable 1 View  Result Date: 06/14/2021 CLINICAL DATA:  falls, confused. eval infiltrate,p tx EXAM: PORTABLE CHEST 1 VIEW COMPARISON:  Radiograph 06/03/2021, chest CT 06/05/2019 FINDINGS: Unchanged cardiomediastinal silhouette with prior median sternotomy and postsurgical changes of CABG. There are diffuse mild interstitial opacities. There is a small right pleural effusion. No visible pneumothorax. There is no acute osseous abnormality on single frontal view of the chest. IMPRESSION: Mild pulmonary edema and small right pleural effusion. Electronically Signed   By: Maurine Simmering M.D.   On: 06/14/2021 10:24   DG Hip Unilat W or Wo Pelvis 2-3 Views Left  Result Date: 06/04/2021 CLINICAL DATA:  Left hip pain for 3-4 days. EXAM: DG HIP (WITH OR WITHOUT PELVIS) 2-3V LEFT COMPARISON:  06/04/2021 abdominal CT FINDINGS: There is no evidence  of hip fracture or dislocation. There is no evidence of arthropathy or other focal bone abnormality. Osteopenia and atherosclerosis. Postoperative left groin. IMPRESSION: No acute finding. Electronically Signed   By: Monte Fantasia M.D.   On: 06/04/2021 07:43    EGD on 06/15/2021  Subjective: Patient seen and examined at bedside.  He is tolerating diet and denies any nausea, vomiting.  Feels okay to go home today.  No worsening shortness of breath reported.  Discharge Exam: Vitals:   06/18/21 0458 06/18/21 0817  BP: (!) 139/56 (!) 148/54  Pulse: 68 63  Resp: 17 18  Temp: 98.5 F (36.9 C) 98 F (36.7 C)  SpO2: 97% 100%    General: Pt is alert, awake, not in acute distress.  Currently on room air. Cardiovascular: rate controlled, S1/S2 + Respiratory: bilateral decreased breath sounds at bases Abdominal: Soft, NT, ND, bowel sounds + Extremities: no edema, no cyanosis    The results of significant diagnostics from this hospitalization (including imaging, microbiology, ancillary and laboratory) are listed below for reference.     Microbiology: Recent Results (from the past  240 hour(s))  Resp Panel by RT-PCR (Flu A&B, Covid) Nasopharyngeal Swab     Status: None   Collection Time: 06/14/21  9:38 AM   Specimen: Nasopharyngeal Swab; Nasopharyngeal(NP) swabs in vial transport medium  Result Value Ref Range Status   SARS Coronavirus 2 by RT PCR NEGATIVE NEGATIVE Final    Comment: (NOTE) SARS-CoV-2 target nucleic acids are NOT DETECTED.  The SARS-CoV-2 RNA is generally detectable in upper respiratory specimens during the acute phase of infection. The lowest concentration of SARS-CoV-2 viral copies this assay can detect is 138 copies/mL. A negative result does not preclude SARS-Cov-2 infection and should not be used as the sole basis for treatment or other patient management decisions. A negative result may occur with  improper specimen collection/handling, submission of specimen  other than nasopharyngeal swab, presence of viral mutation(s) within the areas targeted by this assay, and inadequate number of viral copies(<138 copies/mL). A negative result must be combined with clinical observations, patient history, and epidemiological information. The expected result is Negative.  Fact Sheet for Patients:  EntrepreneurPulse.com.au  Fact Sheet for Healthcare Providers:  IncredibleEmployment.be  This test is no t yet approved or cleared by the [place] FDA and  has been authorized for detection and/or diagnosis of SARS-CoV-2 by FDA under an Emergency Use Authorization (EUA). This EUA will remain  in effect (meaning this test can be used) for the duration of the COVID-19 declaration under Section 564(b)(1) of the Act, 21 U.S.C.section 360bbb-3(b)(1), unless the authorization is terminated  or revoked sooner.       Influenza A by PCR NEGATIVE NEGATIVE Final   Influenza B by PCR NEGATIVE NEGATIVE Final    Comment: (NOTE) The Xpert Xpress SARS-CoV-2/FLU/RSV plus assay is intended as an aid in the diagnosis of influenza from Nasopharyngeal swab specimens and should not be used as a sole basis for treatment. Nasal washings and aspirates are unacceptable for Xpert Xpress SARS-CoV-2/FLU/RSV testing.  Fact Sheet for Patients: EntrepreneurPulse.com.au  Fact Sheet for Healthcare Providers: IncredibleEmployment.be  This test is not yet approved or cleared by the [place] FDA and has been authorized for detection and/or diagnosis of SARS-CoV-2 by FDA under an Emergency Use Authorization (EUA). This EUA will remain in effect (meaning this test can be used) for the duration of the COVID-19 declaration under Section 564(b)(1) of the Act, 21 U.S.C. section 360bbb-3(b)(1), unless the authorization is terminated or revoked.  Performed at [facility], [address]  [place], [place] [address]   MRSA Next Gen by PCR, Nasal     Status: None   Collection Time: 06/14/21  3:20 PM   Specimen: Nasal Mucosa; Nasal Swab  Result Value Ref Range Status   MRSA by PCR Next Gen NOT DETECTED NOT DETECTED Final    Comment: (NOTE) The GeneXpert MRSA Assay (FDA approved for NASAL specimens only), is one component of a comprehensive MRSA colonization surveillance program. It is not intended to diagnose MRSA infection nor to guide or monitor treatment for MRSA infections. Test performance is not FDA approved in patients less than 57 years old. Performed at [facility], [address] [place], [place] [address]      Labs: BNP (last 3 results) Recent Labs    04/09/21 2340 06/04/21 0621 06/14/21 1155  BNP 1,726.5* 3,266.0* [id number]*   Basic Metabolic Panel: Recent Labs  Lab 06/14/21 1155 06/15/21 0029 06/15/21 0513 06/16/21 0835 06/17/21 0735 06/18/21 0409  NA 125*  --  132* 138 135 134*  K  5.2* 5.4* 5.0 4.2 3.9 3.8  CL 99  --  105 108 107 106  CO2 18*  --  '24 22 23 22  '$ GLUCOSE 73  --  100* 157* 130* 83  BUN 105*  --  99* 68* 50* 37*  CREATININE 2.23*  --  2.12* 1.71* 1.43* 1.30*  CALCIUM 7.6*  --  8.8* 8.7* 8.4* 8.2*  MG 2.2  --  2.5*  --  1.9 1.7  PHOS 4.3  --  5.0*  --   --   --    Liver Function Tests: Recent Labs  Lab 06/14/21 1155  AST 36  ALT 42  ALKPHOS 95  BILITOT 0.7  PROT 4.8*  ALBUMIN 2.4*   No results for input(s): LIPASE, AMYLASE in the last 168 hours. No results for input(s): AMMONIA in the last 168 hours. CBC: Recent Labs  Lab 06/14/21 1519 06/15/21 0029 06/15/21 0513 06/15/21 1105 06/15/21 1800 06/16/21 0653 06/17/21 0735 06/18/21 0409  WBC 8.8  --  8.0  --   --  5.3 5.6 5.6  NEUTROABS 7.9*  --   --   --   --   --  3.5 3.5  HGB 6.9*   < > 9.5* 9.4* 9.5* 9.2* 8.8* 8.9*  HCT 19.8*   < > 26.4* 26.9* 27.1* 27.0* 25.3* 26.2*  MCV 86.8  --  82.2  --   --  84.1 85.5 85.9  PLT 110*  --  113*  --   --  120* 118*  125*   < > = values in this interval not displayed.   Cardiac Enzymes: No results for input(s): CKTOTAL, CKMB, CKMBINDEX, TROPONINI in the last 168 hours. BNP: Invalid input(s): POCBNP CBG: Recent Labs  Lab 06/17/21 0730 06/17/21 1133 06/17/21 1613 06/17/21 2034 06/18/21 0815  GLUCAP 118* 147* 129* 103* 104*   D-Dimer No results for input(s): DDIMER in the last 72 hours. Hgb A1c No results for input(s): HGBA1C in the last 72 hours. Lipid Profile No results for input(s): CHOL, HDL, LDLCALC, TRIG, CHOLHDL, LDLDIRECT in the last 72 hours. Thyroid function studies No results for input(s): TSH, T4TOTAL, T3FREE, THYROIDAB in the last 72 hours.  Invalid input(s): FREET3 Anemia work up No results for input(s): VITAMINB12, FOLATE, FERRITIN, TIBC, IRON, RETICCTPCT in the last 72 hours. Urinalysis    Component Value Date/Time   COLORURINE STRAW (A) 06/14/2021 1347   APPEARANCEUR CLEAR 06/14/2021 1347   LABSPEC <1.005 (L) 06/14/2021 1347   PHURINE 5.5 06/14/2021 1347   GLUCOSEU NEGATIVE 06/14/2021 1347   HGBUR NEGATIVE 06/14/2021 1347   BILIRUBINUR NEGATIVE 06/14/2021 1347   KETONESUR NEGATIVE 06/14/2021 1347   PROTEINUR NEGATIVE 06/14/2021 1347   NITRITE NEGATIVE 06/14/2021 1347   LEUKOCYTESUR NEGATIVE 06/14/2021 1347   Sepsis Labs Invalid input(s): PROCALCITONIN,  WBC,  LACTICIDVEN Microbiology Recent Results (from the past 240 hour(s))  Resp Panel by RT-PCR (Flu A&B, Covid) Nasopharyngeal Swab     Status: None   Collection Time: 06/14/21  9:38 AM   Specimen: Nasopharyngeal Swab; Nasopharyngeal(NP) swabs in vial transport medium  Result Value Ref Range Status   SARS Coronavirus 2 by RT PCR NEGATIVE NEGATIVE Final    Comment: (NOTE) SARS-CoV-2 target nucleic acids are NOT DETECTED.  The SARS-CoV-2 RNA is generally detectable in upper respiratory specimens during the acute phase of infection. The lowest concentration of SARS-CoV-2 viral copies this assay can detect  is 138 copies/mL. A negative result does not preclude SARS-Cov-2 infection and should not be used as  the sole basis for treatment or other patient management decisions. A negative result may occur with  improper specimen collection/handling, submission of specimen other than nasopharyngeal swab, presence of viral mutation(s) within the areas targeted by this assay, and inadequate number of viral copies(<138 copies/mL). A negative result must be combined with clinical observations, patient history, and epidemiological information. The expected result is Negative.  Fact Sheet for Patients:  EntrepreneurPulse.com.au  Fact Sheet for Healthcare Providers:  IncredibleEmployment.be  This test is no t yet approved or cleared by the [place] FDA and  has been authorized for detection and/or diagnosis of SARS-CoV-2 by FDA under an Emergency Use Authorization (EUA). This EUA will remain  in effect (meaning this test can be used) for the duration of the COVID-19 declaration under Section 564(b)(1) of the Act, 21 U.S.C.section 360bbb-3(b)(1), unless the authorization is terminated  or revoked sooner.       Influenza A by PCR NEGATIVE NEGATIVE Final   Influenza B by PCR NEGATIVE NEGATIVE Final    Comment: (NOTE) The Xpert Xpress SARS-CoV-2/FLU/RSV plus assay is intended as an aid in the diagnosis of influenza from Nasopharyngeal swab specimens and should not be used as a sole basis for treatment. Nasal washings and aspirates are unacceptable for Xpert Xpress SARS-CoV-2/FLU/RSV testing.  Fact Sheet for Patients: EntrepreneurPulse.com.au  Fact Sheet for Healthcare Providers: IncredibleEmployment.be  This test is not yet approved or cleared by the [place] FDA and has been authorized for detection and/or diagnosis of SARS-CoV-2 by FDA under an Emergency Use Authorization (EUA). This EUA will remain in effect  (meaning this test can be used) for the duration of the COVID-19 declaration under Section 564(b)(1) of the Act, 21 U.S.C. section 360bbb-3(b)(1), unless the authorization is terminated or revoked.  Performed at [facility], [address] [place], [place] [address]   MRSA Next Gen by PCR, Nasal     Status: None   Collection Time: 06/14/21  3:20 PM   Specimen: Nasal Mucosa; Nasal Swab  Result Value Ref Range Status   MRSA by PCR Next Gen NOT DETECTED NOT DETECTED Final    Comment: (NOTE) The GeneXpert MRSA Assay (FDA approved for NASAL specimens only), is one component of a comprehensive MRSA colonization surveillance program. It is not intended to diagnose MRSA infection nor to guide or monitor treatment for MRSA infections. Test performance is not FDA approved in patients less than 43 years old. Performed at [facility], [address] [place], [place] [address]      Time coordinating discharge: 35 minutes  SIGNED:   Aline August, MD  [facility] 06/18/2021, 11:55 AM

## 2021-06-18 NOTE — TOC Initial Note (Signed)
Transition of Care [facility]) - Initial/Assessment Note    Patient Details  Name: Daniel Bolton MRN: [id number] Date of Birth: 06-11-1934  [facility]) CM/SW Contact:    Daniel Hutching, RN Phone Number: 06/18/2021, 10:09 AM  Clinical Narrative:                 Patient admitted to the hospital with hyperkalemia and GI bleeding.  RNCM met with patient at the bedside using the tele-monitor for interpreting services.  Patient is from home where he lives alone, he has family that lives close by and his granddaughter Daniel Bolton goes with him to appointments and helps to translate.  Daniel Bolton will be picking the patient up today.  Patient is active with Meredeth Ide with [facility] aware of discharge today and HH orders are in.    Expected Discharge Plan: [place] Barriers to Discharge: Barriers Resolved   Patient Goals and CMS Choice Patient states their goals for this hospitalization and ongoing recovery are:: Patient glad to be going home and wants [facility] services CMS Medicare.gov Compare Post Acute Care list provided to:: Patient Choice offered to / list presented to : Patient  Expected Discharge Plan and Services Expected Discharge Plan: [place]   Discharge Planning Services: CM Consult Post Acute Care Choice: Resumption of Svcs/PTA Provider, Home Health Living arrangements for the past 2 months: Mobile Home Expected Discharge Date: 06/18/21               DME Arranged: N/A DME Agency: NA       HH Arranged: RN, PT, OT, Social Work [place] Agency: [place] Date [facility] Agency Contacted: 06/18/21 Time [place]: 1007 Representative spoke with at [place]: Tommi Rumps  Prior Living Arrangements/Services Living arrangements for the past 2 months: [place] with:: Self Patient language and need for interpreter reviewed:: Yes (Spanish) Do you feel safe going back to the place where you live?: Yes      Need for Family  Participation in Patient Care: Yes (Comment) (stroke) Care giver support system in place?: Yes (comment) (family support) Current home services: DME (RW) Criminal Activity/Legal Involvement Pertinent to Current Situation/Hospitalization: No - Comment as needed  Activities of Daily Living      Permission Sought/Granted Permission sought to share information with : Case Manager, Family Supports, Other (comment) Permission granted to share information with : Yes, Verbal Permission Granted  Share Information with NAME: Daniel Bolton  Permission granted to share info w AGENCY: [facility]  Permission granted to share info w Relationship: Granddaughter     Emotional Assessment Appearance:: Appears younger than stated age Attitude/Demeanor/Rapport: Engaged Affect (typically observed): Accepting Orientation: : Oriented to Self, Oriented to Place, Oriented to  Time, Oriented to Situation Alcohol / Substance Use: Not Applicable Psych Involvement: No (comment)  Admission diagnosis:  Hyperkalemia [E87.5] Confusion [R41.0] Hyponatremia [E87.1] Aortic aneurysm, abdominal (HCC) [I71.4] Acute GI bleeding [K92.2] AKI (acute kidney injury) ([place]) [N17.9] Gastrointestinal hemorrhage with melena [K92.1] Patient Active Problem List   Diagnosis Date Noted   Acute GI bleeding 06/14/2021   Acute on chronic combined systolic and diastolic CHF (congestive heart failure) ([place]) 06/04/2021   Abdominal pain 06/04/2021   Constipation 06/04/2021   Left hip pain 06/04/2021   Sinus bradycardia 06/04/2021   Stroke (HCC)    HLD (hyperlipidemia)    Hyperkalemia    AAA (abdominal aortic aneurysm) (HCC)    Elevated troponin    Protein-calorie malnutrition, severe  04/12/2021   Essential hypertension 03/22/2021   Acute on chronic diastolic CHF (congestive heart failure) ([place]) 03/22/2021   Hyponatremia 03/22/2021   Acute on chronic systolic CHF (congestive heart failure) ([place]) 03/11/2021   Hypertensive urgency  03/11/2021   Acute renal failure superimposed on stage 3b chronic kidney disease ([place]) 03/11/2021   Normochromic normocytic anemia 03/11/2021   Acute CHF (congestive heart failure) ([place]) 03/11/2021   Moderate major depression, single episode ([place]) 08/01/2015   Coronary artery disease 08/01/2015   Financial problems 08/01/2015   PCP:  Daniel Pitch, MD Pharmacy:   [id number] [place]: [phone]: [address]Phone: [phone]: [phone]    Social Determinants of Health (SDOH) Interventions    Readmission Risk Interventions Readmission Risk Prevention Plan 06/18/2021  Transportation Screening Complete  Medication Review ([place] Complete  PCP or Specialist appointment within 3-5 days of discharge Complete  HRI or HSigourneyComplete  SW Recovery Care/Counseling Consult Complete  PLeith-[place]fieldNot Applicable  Some recent data might be hidden

## 2021-07-17 ENCOUNTER — Ambulatory Visit: Payer: Medicare HMO | Admitting: Family

## 2021-08-08 DIAGNOSIS — N1832 Chronic kidney disease, stage 3b: Secondary | ICD-10-CM | POA: Diagnosis not present

## 2021-08-08 DIAGNOSIS — I25810 Atherosclerosis of coronary artery bypass graft(s) without angina pectoris: Secondary | ICD-10-CM | POA: Diagnosis not present

## 2021-08-08 DIAGNOSIS — I5043 Acute on chronic combined systolic (congestive) and diastolic (congestive) heart failure: Secondary | ICD-10-CM | POA: Diagnosis not present

## 2021-08-08 DIAGNOSIS — G8929 Other chronic pain: Secondary | ICD-10-CM | POA: Diagnosis not present

## 2021-08-08 DIAGNOSIS — I959 Hypotension, unspecified: Secondary | ICD-10-CM | POA: Diagnosis not present

## 2021-08-08 DIAGNOSIS — I130 Hypertensive heart and chronic kidney disease with heart failure and stage 1 through stage 4 chronic kidney disease, or unspecified chronic kidney disease: Secondary | ICD-10-CM | POA: Diagnosis not present

## 2021-08-08 DIAGNOSIS — I2510 Atherosclerotic heart disease of native coronary artery without angina pectoris: Secondary | ICD-10-CM | POA: Diagnosis not present

## 2021-08-11 ENCOUNTER — Inpatient Hospital Stay
Admission: EM | Admit: 2021-08-11 | Discharge: 2021-08-26 | DRG: 682 | Disposition: A | Discharge status: Hospice | Payer: Medicare HMO | Attending: Internal Medicine | Admitting: Internal Medicine

## 2021-08-11 ENCOUNTER — Other Ambulatory Visit: Payer: Self-pay

## 2021-08-11 DIAGNOSIS — M319 Necrotizing vasculopathy, unspecified: Secondary | ICD-10-CM | POA: Diagnosis present

## 2021-08-11 DIAGNOSIS — R778 Other specified abnormalities of plasma proteins: Secondary | ICD-10-CM | POA: Diagnosis not present

## 2021-08-11 DIAGNOSIS — T426X5A Adverse effect of other antiepileptic and sedative-hypnotic drugs, initial encounter: Secondary | ICD-10-CM | POA: Diagnosis not present

## 2021-08-11 DIAGNOSIS — R821 Myoglobinuria: Secondary | ICD-10-CM | POA: Diagnosis present

## 2021-08-11 DIAGNOSIS — K219 Gastro-esophageal reflux disease without esophagitis: Secondary | ICD-10-CM | POA: Diagnosis present

## 2021-08-11 DIAGNOSIS — I714 Abdominal aortic aneurysm, without rupture, unspecified: Secondary | ICD-10-CM | POA: Diagnosis not present

## 2021-08-11 DIAGNOSIS — E7800 Pure hypercholesterolemia, unspecified: Secondary | ICD-10-CM | POA: Diagnosis not present

## 2021-08-11 DIAGNOSIS — I2510 Atherosclerotic heart disease of native coronary artery without angina pectoris: Secondary | ICD-10-CM | POA: Diagnosis present

## 2021-08-11 DIAGNOSIS — J811 Chronic pulmonary edema: Secondary | ICD-10-CM | POA: Diagnosis not present

## 2021-08-11 DIAGNOSIS — N19 Unspecified kidney failure: Secondary | ICD-10-CM | POA: Diagnosis present

## 2021-08-11 DIAGNOSIS — K8010 Calculus of gallbladder with chronic cholecystitis without obstruction: Secondary | ICD-10-CM | POA: Diagnosis present

## 2021-08-11 DIAGNOSIS — I10 Essential (primary) hypertension: Secondary | ICD-10-CM | POA: Diagnosis present

## 2021-08-11 DIAGNOSIS — M5126 Other intervertebral disc displacement, lumbar region: Secondary | ICD-10-CM | POA: Diagnosis not present

## 2021-08-11 DIAGNOSIS — D649 Anemia, unspecified: Secondary | ICD-10-CM | POA: Diagnosis not present

## 2021-08-11 DIAGNOSIS — R600 Localized edema: Secondary | ICD-10-CM | POA: Diagnosis not present

## 2021-08-11 DIAGNOSIS — I509 Heart failure, unspecified: Secondary | ICD-10-CM

## 2021-08-11 DIAGNOSIS — L89626 Pressure-induced deep tissue damage of left heel: Secondary | ICD-10-CM | POA: Diagnosis present

## 2021-08-11 DIAGNOSIS — N189 Chronic kidney disease, unspecified: Secondary | ICD-10-CM | POA: Diagnosis not present

## 2021-08-11 DIAGNOSIS — N400 Enlarged prostate without lower urinary tract symptoms: Secondary | ICD-10-CM | POA: Diagnosis present

## 2021-08-11 DIAGNOSIS — E878 Other disorders of electrolyte and fluid balance, not elsewhere classified: Secondary | ICD-10-CM | POA: Diagnosis present

## 2021-08-11 DIAGNOSIS — F321 Major depressive disorder, single episode, moderate: Secondary | ICD-10-CM | POA: Diagnosis present

## 2021-08-11 DIAGNOSIS — Z515 Encounter for palliative care: Secondary | ICD-10-CM | POA: Diagnosis not present

## 2021-08-11 DIAGNOSIS — N1831 Chronic kidney disease, stage 3a: Secondary | ICD-10-CM | POA: Diagnosis not present

## 2021-08-11 DIAGNOSIS — Z8673 Personal history of transient ischemic attack (TIA), and cerebral infarction without residual deficits: Secondary | ICD-10-CM

## 2021-08-11 DIAGNOSIS — K8020 Calculus of gallbladder without cholecystitis without obstruction: Secondary | ICD-10-CM | POA: Diagnosis not present

## 2021-08-11 DIAGNOSIS — M48061 Spinal stenosis, lumbar region without neurogenic claudication: Secondary | ICD-10-CM | POA: Diagnosis present

## 2021-08-11 DIAGNOSIS — N289 Disorder of kidney and ureter, unspecified: Secondary | ICD-10-CM | POA: Diagnosis not present

## 2021-08-11 DIAGNOSIS — I5043 Acute on chronic combined systolic (congestive) and diastolic (congestive) heart failure: Secondary | ICD-10-CM | POA: Diagnosis not present

## 2021-08-11 DIAGNOSIS — N179 Acute kidney failure, unspecified: Secondary | ICD-10-CM | POA: Diagnosis not present

## 2021-08-11 DIAGNOSIS — W19XXXA Unspecified fall, initial encounter: Secondary | ICD-10-CM | POA: Diagnosis present

## 2021-08-11 DIAGNOSIS — R7401 Elevation of levels of liver transaminase levels: Secondary | ICD-10-CM | POA: Diagnosis not present

## 2021-08-11 DIAGNOSIS — R945 Abnormal results of liver function studies: Secondary | ICD-10-CM | POA: Diagnosis present

## 2021-08-11 DIAGNOSIS — R0602 Shortness of breath: Secondary | ICD-10-CM | POA: Diagnosis not present

## 2021-08-11 DIAGNOSIS — T361X5A Adverse effect of cephalosporins and other beta-lactam antibiotics, initial encounter: Secondary | ICD-10-CM | POA: Diagnosis not present

## 2021-08-11 DIAGNOSIS — Z888 Allergy status to other drugs, medicaments and biological substances status: Secondary | ICD-10-CM

## 2021-08-11 DIAGNOSIS — E876 Hypokalemia: Secondary | ICD-10-CM | POA: Diagnosis not present

## 2021-08-11 DIAGNOSIS — I5032 Chronic diastolic (congestive) heart failure: Secondary | ICD-10-CM | POA: Diagnosis not present

## 2021-08-11 DIAGNOSIS — E440 Moderate protein-calorie malnutrition: Secondary | ICD-10-CM | POA: Diagnosis present

## 2021-08-11 DIAGNOSIS — E871 Hypo-osmolality and hyponatremia: Secondary | ICD-10-CM | POA: Diagnosis present

## 2021-08-11 DIAGNOSIS — Z8249 Family history of ischemic heart disease and other diseases of the circulatory system: Secondary | ICD-10-CM

## 2021-08-11 DIAGNOSIS — B370 Candidal stomatitis: Secondary | ICD-10-CM | POA: Diagnosis not present

## 2021-08-11 DIAGNOSIS — J984 Other disorders of lung: Secondary | ICD-10-CM | POA: Diagnosis not present

## 2021-08-11 DIAGNOSIS — R1310 Dysphagia, unspecified: Secondary | ICD-10-CM | POA: Diagnosis present

## 2021-08-11 DIAGNOSIS — E785 Hyperlipidemia, unspecified: Secondary | ICD-10-CM | POA: Diagnosis present

## 2021-08-11 DIAGNOSIS — M5116 Intervertebral disc disorders with radiculopathy, lumbar region: Secondary | ICD-10-CM | POA: Diagnosis present

## 2021-08-11 DIAGNOSIS — E7849 Other hyperlipidemia: Secondary | ICD-10-CM | POA: Diagnosis not present

## 2021-08-11 DIAGNOSIS — M5127 Other intervertebral disc displacement, lumbosacral region: Secondary | ICD-10-CM | POA: Diagnosis not present

## 2021-08-11 DIAGNOSIS — Z8711 Personal history of peptic ulcer disease: Secondary | ICD-10-CM

## 2021-08-11 DIAGNOSIS — G928 Other toxic encephalopathy: Secondary | ICD-10-CM | POA: Diagnosis not present

## 2021-08-11 DIAGNOSIS — M47814 Spondylosis without myelopathy or radiculopathy, thoracic region: Secondary | ICD-10-CM | POA: Diagnosis not present

## 2021-08-11 DIAGNOSIS — Z20822 Contact with and (suspected) exposure to covid-19: Secondary | ICD-10-CM | POA: Diagnosis not present

## 2021-08-11 DIAGNOSIS — I440 Atrioventricular block, first degree: Secondary | ICD-10-CM | POA: Diagnosis present

## 2021-08-11 DIAGNOSIS — K921 Melena: Secondary | ICD-10-CM | POA: Diagnosis present

## 2021-08-11 DIAGNOSIS — I429 Cardiomyopathy, unspecified: Secondary | ICD-10-CM | POA: Diagnosis present

## 2021-08-11 DIAGNOSIS — M7989 Other specified soft tissue disorders: Secondary | ICD-10-CM | POA: Diagnosis not present

## 2021-08-11 DIAGNOSIS — I129 Hypertensive chronic kidney disease with stage 1 through stage 4 chronic kidney disease, or unspecified chronic kidney disease: Secondary | ICD-10-CM | POA: Diagnosis not present

## 2021-08-11 DIAGNOSIS — Z79899 Other long term (current) drug therapy: Secondary | ICD-10-CM

## 2021-08-11 DIAGNOSIS — M4802 Spinal stenosis, cervical region: Secondary | ICD-10-CM | POA: Diagnosis present

## 2021-08-11 DIAGNOSIS — R700 Elevated erythrocyte sedimentation rate: Secondary | ICD-10-CM | POA: Diagnosis present

## 2021-08-11 DIAGNOSIS — R54 Age-related physical debility: Secondary | ICD-10-CM | POA: Diagnosis not present

## 2021-08-11 DIAGNOSIS — R7989 Other specified abnormal findings of blood chemistry: Secondary | ICD-10-CM | POA: Diagnosis present

## 2021-08-11 DIAGNOSIS — E875 Hyperkalemia: Secondary | ICD-10-CM | POA: Diagnosis not present

## 2021-08-11 DIAGNOSIS — Z8616 Personal history of COVID-19: Secondary | ICD-10-CM | POA: Diagnosis not present

## 2021-08-11 DIAGNOSIS — I5022 Chronic systolic (congestive) heart failure: Secondary | ICD-10-CM | POA: Diagnosis not present

## 2021-08-11 DIAGNOSIS — R109 Unspecified abdominal pain: Secondary | ICD-10-CM | POA: Diagnosis not present

## 2021-08-11 DIAGNOSIS — Z7189 Other specified counseling: Secondary | ICD-10-CM

## 2021-08-11 DIAGNOSIS — R278 Other lack of coordination: Secondary | ICD-10-CM | POA: Diagnosis present

## 2021-08-11 DIAGNOSIS — R531 Weakness: Secondary | ICD-10-CM | POA: Diagnosis not present

## 2021-08-11 DIAGNOSIS — N184 Chronic kidney disease, stage 4 (severe): Secondary | ICD-10-CM | POA: Diagnosis present

## 2021-08-11 DIAGNOSIS — G9341 Metabolic encephalopathy: Secondary | ICD-10-CM | POA: Diagnosis present

## 2021-08-11 DIAGNOSIS — J90 Pleural effusion, not elsewhere classified: Secondary | ICD-10-CM | POA: Diagnosis present

## 2021-08-11 DIAGNOSIS — R001 Bradycardia, unspecified: Secondary | ICD-10-CM | POA: Diagnosis not present

## 2021-08-11 DIAGNOSIS — M5416 Radiculopathy, lumbar region: Secondary | ICD-10-CM | POA: Diagnosis present

## 2021-08-11 DIAGNOSIS — M6282 Rhabdomyolysis: Secondary | ICD-10-CM | POA: Diagnosis not present

## 2021-08-11 DIAGNOSIS — M609 Myositis, unspecified: Secondary | ICD-10-CM | POA: Diagnosis not present

## 2021-08-11 DIAGNOSIS — R609 Edema, unspecified: Secondary | ICD-10-CM | POA: Diagnosis not present

## 2021-08-11 DIAGNOSIS — R1011 Right upper quadrant pain: Secondary | ICD-10-CM | POA: Diagnosis not present

## 2021-08-11 DIAGNOSIS — R748 Abnormal levels of other serum enzymes: Secondary | ICD-10-CM | POA: Diagnosis present

## 2021-08-11 DIAGNOSIS — R079 Chest pain, unspecified: Secondary | ICD-10-CM | POA: Diagnosis not present

## 2021-08-11 DIAGNOSIS — Z66 Do not resuscitate: Secondary | ICD-10-CM | POA: Diagnosis not present

## 2021-08-11 DIAGNOSIS — L89619 Pressure ulcer of right heel, unspecified stage: Secondary | ICD-10-CM | POA: Diagnosis present

## 2021-08-11 DIAGNOSIS — I517 Cardiomegaly: Secondary | ICD-10-CM | POA: Diagnosis not present

## 2021-08-11 DIAGNOSIS — R159 Full incontinence of feces: Secondary | ICD-10-CM | POA: Diagnosis present

## 2021-08-11 DIAGNOSIS — D631 Anemia in chronic kidney disease: Secondary | ICD-10-CM | POA: Diagnosis present

## 2021-08-11 DIAGNOSIS — Z789 Other specified health status: Secondary | ICD-10-CM | POA: Diagnosis not present

## 2021-08-11 DIAGNOSIS — J449 Chronic obstructive pulmonary disease, unspecified: Secondary | ICD-10-CM | POA: Diagnosis present

## 2021-08-11 DIAGNOSIS — J690 Pneumonitis due to inhalation of food and vomit: Secondary | ICD-10-CM | POA: Diagnosis not present

## 2021-08-11 DIAGNOSIS — K8080 Other cholelithiasis without obstruction: Secondary | ICD-10-CM | POA: Diagnosis not present

## 2021-08-11 DIAGNOSIS — M50223 Other cervical disc displacement at C6-C7 level: Secondary | ICD-10-CM | POA: Diagnosis not present

## 2021-08-11 DIAGNOSIS — R7982 Elevated C-reactive protein (CRP): Secondary | ICD-10-CM | POA: Diagnosis present

## 2021-08-11 DIAGNOSIS — R809 Proteinuria, unspecified: Secondary | ICD-10-CM | POA: Diagnosis present

## 2021-08-11 DIAGNOSIS — Z951 Presence of aortocoronary bypass graft: Secondary | ICD-10-CM

## 2021-08-11 DIAGNOSIS — I081 Rheumatic disorders of both mitral and tricuspid valves: Secondary | ICD-10-CM | POA: Diagnosis present

## 2021-08-11 DIAGNOSIS — I130 Hypertensive heart and chronic kidney disease with heart failure and stage 1 through stage 4 chronic kidney disease, or unspecified chronic kidney disease: Secondary | ICD-10-CM | POA: Diagnosis present

## 2021-08-11 DIAGNOSIS — G8929 Other chronic pain: Secondary | ICD-10-CM | POA: Diagnosis present

## 2021-08-11 DIAGNOSIS — K5900 Constipation, unspecified: Secondary | ICD-10-CM | POA: Diagnosis present

## 2021-08-11 LAB — BASIC METABOLIC PANEL
Anion gap: 10 (ref 5–15)
BUN: 118 mg/dL — ABNORMAL HIGH (ref 8–23)
CO2: 23 mmol/L (ref 22–32)
Calcium: 8.8 mg/dL — ABNORMAL LOW (ref 8.9–10.3)
Chloride: 97 mmol/L — ABNORMAL LOW (ref 98–111)
Creatinine, Ser: 3.42 mg/dL — ABNORMAL HIGH (ref 0.61–1.24)
GFR, Estimated: 17 mL/min — ABNORMAL LOW (ref >60)
Glucose, Bld: 100 mg/dL — ABNORMAL HIGH (ref 70–99)
Potassium: 5.7 mmol/L — ABNORMAL HIGH (ref 3.5–5.1)
Sodium: 130 mmol/L — ABNORMAL LOW (ref 135–145)

## 2021-08-11 LAB — TYPE AND SCREEN
ABO/RH(D): O POS
Antibody Screen: NEGATIVE

## 2021-08-11 LAB — CBC
HCT: 29.7 % — ABNORMAL LOW (ref 39.0–52.0)
Hemoglobin: 9.6 g/dL — ABNORMAL LOW (ref 13.0–17.0)
MCH: 27.4 pg (ref 26.0–34.0)
MCHC: 32.3 g/dL (ref 30.0–36.0)
MCV: 84.6 fL (ref 80.0–100.0)
Platelets: 190 10*3/uL (ref 150–400)
RBC: 3.51 MIL/uL — ABNORMAL LOW (ref 4.22–5.81)
RDW: 15.4 % (ref 11.5–15.5)
WBC: 8.7 10*3/uL (ref 4.0–10.5)
nRBC: 0 % (ref 0.0–0.2)

## 2021-08-11 LAB — TROPONIN I (HIGH SENSITIVITY): Troponin I (High Sensitivity): 111 ng/L (ref <18)

## 2021-08-11 LAB — CBG MONITORING, ED: Glucose-Capillary: 95 mg/dL (ref 70–99)

## 2021-08-11 NOTE — ED Provider Notes (Signed)
[facility] Emergency Department Provider Note  ____________________________________________   Event Date/Time   First MD Initiated Contact with Patient 08/11/21 2351     (approximate)  I have reviewed the triage vital signs and the nursing notes.   HISTORY  Chief Complaint Weakness    HPI Daniel Bolton is a 87 y.o. male with history of COPD, CHF, hypertension, CVA, CAD status post CABG who presents to the emergency department with complaints of generalized weakness since Friday, November 4.  Granddaughter reports that he has had decreased appetite and oral intake.  No fevers, cough, chest pain, shortness of breath, vomiting or diarrhea.   He is on oxygen at home but she is not sure how much.  States he lives with his daughter and uses a walker at baseline.  On Friday he did have a fall but she describes it as him sliding down to the floor slowly onto his bottom.  He did not hit his head.  She states that his legs have been swollen for a couple of days and while being here in the emergency department she noticed that his right arm is swollen.  No history of PE or DVT.  Not on blood thinners.  No injury to this arm.      Echo 04/10/21:  1. Left ventricular ejection fraction, by estimation, is 25 to 30%. The  left ventricle has severely decreased function. The left ventricle  demonstrates global hypokinesis. There is mild left ventricular  hypertrophy. Left ventricular diastolic parameters   are consistent with Grade II diastolic dysfunction (pseudonormalization).  The average left ventricular global longitudinal strain is -9.4 %. The  global longitudinal strain is abnormal.      Past Medical History:  Diagnosis Date   CHF (congestive heart failure) ([place])    Chronic kidney disease    Hypertension    Hyponatremia    Stroke [facility])     Patient Active Problem List   Diagnosis Date Noted   Acute GI bleeding 06/14/2021   Acute on chronic combined  systolic and diastolic CHF (congestive heart failure) ([place]) 06/04/2021   Abdominal pain 06/04/2021   Constipation 06/04/2021   Left hip pain 06/04/2021   Sinus bradycardia 06/04/2021   Stroke ([place])    HLD (hyperlipidemia)    Hyperkalemia    AAA (abdominal aortic aneurysm)    Elevated troponin    Protein-calorie malnutrition, severe 04/12/2021   Essential hypertension 03/22/2021   Acute on chronic diastolic CHF (congestive heart failure) ([place]) 03/22/2021   Hyponatremia 03/22/2021   Acute on chronic systolic CHF (congestive heart failure) ([place]) 03/11/2021   Hypertensive urgency 03/11/2021   Acute renal failure superimposed on stage 3b chronic kidney disease ([place]) 03/11/2021   Normochromic normocytic anemia 03/11/2021   Acute CHF (congestive heart failure) ([place]) 03/11/2021   Moderate major depression, single episode ([place]) 08/01/2015   Coronary artery disease 08/01/2015   Financial problems 08/01/2015    Past Surgical History:  Procedure Laterality Date   BRAIN SURGERY     CARDIAC SURGERY     ESOPHAGOGASTRODUODENOSCOPY N/A 06/15/2021   Procedure: ESOPHAGOGASTRODUODENOSCOPY (EGD);  Surgeon: Annamaria Helling, DO;  Location: [facility] ENDOSCOPY;  Service: Gastroenterology;  Laterality: N/A;    Prior to Admission medications   Medication Sig Start Date End Date Taking? Authorizing Provider  amLODipine (NORVASC) 10 MG tablet Take 1 tablet (10 mg total) by mouth daily. 06/07/21  Yes Jennye Boroughs, MD  feeding supplement (ENSURE ENLIVE / ENSURE PLUS) LIQD Take 237 mLs  by mouth 2 (two) times daily between meals. 04/14/21  Yes Enzo Bi, MD  furosemide (LASIX) 40 MG tablet Take 1 tablet (40 mg total) by mouth daily. 04/14/21 08/12/21 Yes Enzo Bi, MD  metoprolol succinate (TOPROL-XL) 50 MG 24 hr tablet Take 0.5 tablets (25 mg total) by mouth daily. Take with or immediately following a meal. 06/06/21  Yes Jennye Boroughs, MD  olmesartan (BENICAR) 20 MG tablet Take 20 mg by mouth daily.   Yes  [provider]  pantoprazole (PROTONIX) 40 MG tablet Take 1 tablet (40 mg total) by mouth 2 (two) times daily. 06/18/21  Yes Aline August, MD  simvastatin (ZOCOR) 80 MG tablet Take 80 mg by mouth daily.   Yes [provider]    Allergies Hydralazine  Family History  Problem Relation Age of Onset   Cancer Sister    Heart disease Sister     Social History Social History   Tobacco Use   Smoking status: Never   Smokeless tobacco: Never  Substance Use Topics   Alcohol use: No   Drug use: No    Review of Systems Constitutional: No fever. Eyes: No visual changes. ENT: No sore throat. Cardiovascular: Denies chest pain. Respiratory: Denies shortness of breath. Gastrointestinal: No nausea, vomiting, diarrhea. Genitourinary: Negative for dysuria. Musculoskeletal: + chronic lower back pain Skin: Negative for rash. Neurological: Negative for focal weakness or numbness.  ____________________________________________   PHYSICAL EXAM:  VITAL SIGNS: ED Triage Vitals  Enc Vitals Group     BP 08/11/21 1919 (!) 144/49     Pulse Rate 08/11/21 1919 (!) 50     Resp 08/11/21 1919 18     Temp 08/11/21 1919 98.1 F (36.7 C)     Temp Source 08/11/21 1919 Oral     SpO2 08/11/21 1919 100 %     Weight 08/11/21 1921 132 lb (59.9 kg)     Height 08/11/21 1921 5\' 1"  (1.549 m)     Head Circumference --      Peak Flow --      Pain Score 08/11/21 1920 10     Pain Loc --      Pain Edu? --      Excl. in [place]? --    CONSTITUTIONAL: Alert and oriented and responds appropriately to questions.  Thin, elderly, chronically ill-appearing HEAD: Normocephalic EYES: Conjunctivae clear, pupils appear equal, EOM appear intact ENT: normal nose; moist mucous membranes NECK: Supple, normal ROM CARD: Regular and bradycardic; S1 and S2 appreciated; no murmurs, no clicks, no rubs, no gallops RESP: Normal chest excursion without splinting or tachypnea; breath sounds clear and equal  bilaterally; no wheezes, no rhonchi, no rales, no hypoxia or respiratory distress, speaking full sentences ABD/GI: Normal bowel sounds; non-distended; soft, non-tender, no rebound, no guarding, no peritoneal signs, no hepatosplenomegaly BACK: The back appears normal EXT: Normal ROM in all joints; no deformity noted, no cyanosis, no significant edema noted in his bilateral lower extremities, no calf tenderness or calf swelling, patient does have some swelling in the right upper arm compared to the left but compartments are soft and there is no redness, warmth, ecchymosis or deformity.  No joint effusions.  2+ radial and DP pulses bilaterally. SKIN: Normal color for age and race; warm; no rash on exposed skin NEURO: Moves all extremities equally PSYCH: The patient's mood and manner are appropriate.  ____________________________________________   LABS (all labs ordered are listed, but only abnormal results are displayed)  Labs Reviewed  BASIC METABOLIC PANEL -  Abnormal; Notable for the following components:      Result Value   Sodium 130 (*)    Potassium 5.7 (*)    Chloride 97 (*)    Glucose, Bld 100 (*)    BUN 118 (*)    Creatinine, Ser 3.42 (*)    Calcium 8.8 (*)    GFR, Estimated 17 (*)    All other components within normal limits  CBC - Abnormal; Notable for the following components:   RBC 3.51 (*)    Hemoglobin 9.6 (*)    HCT 29.7 (*)    All other components within normal limits  URINALYSIS, ROUTINE W REFLEX MICROSCOPIC - Abnormal; Notable for the following components:   Color, Urine YELLOW (*)    APPearance CLEAR (*)    Hgb urine dipstick LARGE (*)    Protein, ur 100 (*)    All other components within normal limits  TROPONIN I (HIGH SENSITIVITY) - Abnormal; Notable for the following components:   Troponin I (High Sensitivity) 111 (*)    All other components within normal limits  RESP PANEL BY RT-PCR (FLU A&B, COVID) ARPGX2  HEPATIC FUNCTION PANEL  D-DIMER, QUANTITATIVE   CBG MONITORING, ED  TYPE AND SCREEN  TROPONIN I (HIGH SENSITIVITY)   ____________________________________________  EKG   EKG Interpretation  Date/Time:  Sunday August 11 2021 19:17:53 EST Ventricular Rate:  51 PR Interval:  222 QRS Duration: 100 QT Interval:  474 QTC Calculation: 436 R Axis:   76 Text Interpretation: Sinus bradycardia with 1st degree A-V block Nonspecific T wave abnormality Abnormal ECG No significant change since last tracing Confirmed by Pryor Curia [phone]) on 08/11/2021 11:52:36 PM        ____________________________________________  RADIOLOGY Jessie Foot Camika Marsico, personally viewed and evaluated these images (plain radiographs) as part of my medical decision making, as well as reviewing the written report by the radiologist.  ED MD interpretation:  [place] pending  Official radiology report(s): No results found.  ____________________________________________   PROCEDURES  Procedure(s) performed (including Critical Care):  Procedures    ____________________________________________   INITIAL IMPRESSION / ASSESSMENT AND PLAN / ED COURSE  As part of my medical decision making, I reviewed the following data within the [place] History obtained from family, Nursing notes reviewed and incorporated, Labs reviewed , EKG interpreted , Old EKG reviewed, Old chart reviewed, Discussed with admitting physician , and Notes from prior ED visits         Patient here with complaints of generalized weakness and decreased oral intake.  He appears dry on exam and labs are suggestive of dehydration with AKI that I suspect is prerenal and mild hyperkalemia without EKG changes.  He is on cardiac monitoring.  We will begin hydrating patient but will do so cautiously given history of EF of 25% in July of this year.  He denies any chest pain or shortness of breath but his troponin is elevated.  This could be due to his AKI.  Repeat troponin is pending.   EKG shows no new ischemic change compared to previous.  He does have some swelling of the right upper extremity with no history of injury.  Nothing at this time to suggest fracture.  No sign of compartment syndrome, cellulitis, gout, septic arthritis, arterial obstruction.  Will obtain venous Doppler.  Will give full dose aspirin.  We will hold heparin at this time given I am not convinced that this is an NSTEMI.  Patient will need admission given his  worsening renal function.  Granddaughter and patient comfortable with this plan.  Will discuss with hospitalist.  ED PROGRESS     12:17 AM Discussed patient's case with hospitalist, Dr. Sidney Ace.  I have recommended admission and patient (and family if present) agree with this plan. Admitting physician will place admission orders.   I reviewed all nursing notes, vitals, pertinent previous records and reviewed/interpreted all EKGs, lab and urine results, imaging (as available).  Hospitalist recommends obtaining D-dimer as well. ____________________________________________   FINAL CLINICAL IMPRESSION(S) / ED DIAGNOSES  Final diagnoses:  Swelling of arm  AKI (acute kidney injury) ([place])  Generalized weakness  Hyperkalemia  Elevated troponin     ED Discharge Orders     None       *Please note:  Daniel Bolton was evaluated in Emergency Department on 08/12/2021 for the symptoms described in the history of present illness. He was evaluated in the context of the global COVID-19 pandemic, which necessitated consideration that the patient might be at risk for infection with the SARS-CoV-2 virus that causes COVID-19. Institutional protocols and algorithms that pertain to the evaluation of patients at risk for COVID-19 are in a state of rapid change based on information released by regulatory bodies including the CDC and federal and state organizations. These policies and algorithms were followed during the patient's care in the ED.  Some ED  evaluations and interventions may be delayed as a result of limited staffing during and the pandemic.*   Note:  This document was prepared using Dragon voice recognition software and may include unintentional dictation errors.    Maricella Filyaw, Delice Bison, DO 08/12/21 0030

## 2021-08-11 NOTE — ED Triage Notes (Signed)
Pt from home, pt's grand daughter states pt has weakness since Thursday night. Pt has had trouble getting out of bed and felt weakness in his legs bilaterally, pt is also not eating well. Family denies fevers. Pt has hx of cardiac stent, ulcers. Pt is usually able to walk with walker. Pt with edema in hands and legs.

## 2021-08-11 NOTE — ED Notes (Signed)
Pt with swelling and pain in right elbow, no injury per family. Swelling started today per family.

## 2021-08-12 ENCOUNTER — Inpatient Hospital Stay: Payer: Medicare HMO

## 2021-08-12 ENCOUNTER — Emergency Department: Payer: Medicare HMO

## 2021-08-12 DIAGNOSIS — L89626 Pressure-induced deep tissue damage of left heel: Secondary | ICD-10-CM | POA: Diagnosis present

## 2021-08-12 DIAGNOSIS — I10 Essential (primary) hypertension: Secondary | ICD-10-CM

## 2021-08-12 DIAGNOSIS — Z20822 Contact with and (suspected) exposure to covid-19: Secondary | ICD-10-CM | POA: Diagnosis present

## 2021-08-12 DIAGNOSIS — E871 Hypo-osmolality and hyponatremia: Secondary | ICD-10-CM | POA: Diagnosis present

## 2021-08-12 DIAGNOSIS — M6282 Rhabdomyolysis: Secondary | ICD-10-CM | POA: Diagnosis present

## 2021-08-12 DIAGNOSIS — R821 Myoglobinuria: Secondary | ICD-10-CM | POA: Diagnosis present

## 2021-08-12 DIAGNOSIS — M7989 Other specified soft tissue disorders: Secondary | ICD-10-CM | POA: Diagnosis not present

## 2021-08-12 DIAGNOSIS — K8020 Calculus of gallbladder without cholecystitis without obstruction: Secondary | ICD-10-CM | POA: Diagnosis not present

## 2021-08-12 DIAGNOSIS — I130 Hypertensive heart and chronic kidney disease with heart failure and stage 1 through stage 4 chronic kidney disease, or unspecified chronic kidney disease: Secondary | ICD-10-CM | POA: Diagnosis present

## 2021-08-12 DIAGNOSIS — E875 Hyperkalemia: Secondary | ICD-10-CM | POA: Diagnosis not present

## 2021-08-12 DIAGNOSIS — W19XXXA Unspecified fall, initial encounter: Secondary | ICD-10-CM | POA: Diagnosis present

## 2021-08-12 DIAGNOSIS — M47814 Spondylosis without myelopathy or radiculopathy, thoracic region: Secondary | ICD-10-CM | POA: Diagnosis not present

## 2021-08-12 DIAGNOSIS — Z515 Encounter for palliative care: Secondary | ICD-10-CM | POA: Diagnosis not present

## 2021-08-12 DIAGNOSIS — I714 Abdominal aortic aneurysm, without rupture, unspecified: Secondary | ICD-10-CM | POA: Diagnosis not present

## 2021-08-12 DIAGNOSIS — I5043 Acute on chronic combined systolic (congestive) and diastolic (congestive) heart failure: Secondary | ICD-10-CM | POA: Diagnosis present

## 2021-08-12 DIAGNOSIS — N189 Chronic kidney disease, unspecified: Secondary | ICD-10-CM

## 2021-08-12 DIAGNOSIS — G928 Other toxic encephalopathy: Secondary | ICD-10-CM | POA: Diagnosis not present

## 2021-08-12 DIAGNOSIS — R531 Weakness: Secondary | ICD-10-CM | POA: Diagnosis present

## 2021-08-12 DIAGNOSIS — J90 Pleural effusion, not elsewhere classified: Secondary | ICD-10-CM | POA: Diagnosis not present

## 2021-08-12 DIAGNOSIS — E7800 Pure hypercholesterolemia, unspecified: Secondary | ICD-10-CM | POA: Diagnosis not present

## 2021-08-12 DIAGNOSIS — J984 Other disorders of lung: Secondary | ICD-10-CM | POA: Diagnosis not present

## 2021-08-12 DIAGNOSIS — R778 Other specified abnormalities of plasma proteins: Secondary | ICD-10-CM | POA: Diagnosis not present

## 2021-08-12 DIAGNOSIS — Z66 Do not resuscitate: Secondary | ICD-10-CM | POA: Diagnosis not present

## 2021-08-12 DIAGNOSIS — D631 Anemia in chronic kidney disease: Secondary | ICD-10-CM | POA: Diagnosis present

## 2021-08-12 DIAGNOSIS — J449 Chronic obstructive pulmonary disease, unspecified: Secondary | ICD-10-CM | POA: Diagnosis present

## 2021-08-12 DIAGNOSIS — N179 Acute kidney failure, unspecified: Principal | ICD-10-CM

## 2021-08-12 DIAGNOSIS — Z7189 Other specified counseling: Secondary | ICD-10-CM | POA: Diagnosis not present

## 2021-08-12 DIAGNOSIS — R600 Localized edema: Secondary | ICD-10-CM | POA: Diagnosis not present

## 2021-08-12 DIAGNOSIS — E440 Moderate protein-calorie malnutrition: Secondary | ICD-10-CM | POA: Diagnosis present

## 2021-08-12 DIAGNOSIS — I081 Rheumatic disorders of both mitral and tricuspid valves: Secondary | ICD-10-CM | POA: Diagnosis present

## 2021-08-12 DIAGNOSIS — J690 Pneumonitis due to inhalation of food and vomit: Secondary | ICD-10-CM | POA: Diagnosis not present

## 2021-08-12 DIAGNOSIS — K8010 Calculus of gallbladder with chronic cholecystitis without obstruction: Secondary | ICD-10-CM | POA: Diagnosis present

## 2021-08-12 DIAGNOSIS — E785 Hyperlipidemia, unspecified: Secondary | ICD-10-CM | POA: Diagnosis present

## 2021-08-12 DIAGNOSIS — F321 Major depressive disorder, single episode, moderate: Secondary | ICD-10-CM | POA: Diagnosis present

## 2021-08-12 DIAGNOSIS — R7989 Other specified abnormal findings of blood chemistry: Secondary | ICD-10-CM | POA: Diagnosis not present

## 2021-08-12 DIAGNOSIS — E7849 Other hyperlipidemia: Secondary | ICD-10-CM | POA: Diagnosis not present

## 2021-08-12 DIAGNOSIS — Z8616 Personal history of COVID-19: Secondary | ICD-10-CM | POA: Diagnosis not present

## 2021-08-12 DIAGNOSIS — I2510 Atherosclerotic heart disease of native coronary artery without angina pectoris: Secondary | ICD-10-CM | POA: Diagnosis not present

## 2021-08-12 DIAGNOSIS — R0602 Shortness of breath: Secondary | ICD-10-CM | POA: Diagnosis not present

## 2021-08-12 DIAGNOSIS — M48061 Spinal stenosis, lumbar region without neurogenic claudication: Secondary | ICD-10-CM | POA: Diagnosis not present

## 2021-08-12 DIAGNOSIS — M5126 Other intervertebral disc displacement, lumbar region: Secondary | ICD-10-CM | POA: Diagnosis not present

## 2021-08-12 DIAGNOSIS — I5032 Chronic diastolic (congestive) heart failure: Secondary | ICD-10-CM | POA: Diagnosis not present

## 2021-08-12 DIAGNOSIS — M5127 Other intervertebral disc displacement, lumbosacral region: Secondary | ICD-10-CM | POA: Diagnosis not present

## 2021-08-12 DIAGNOSIS — I429 Cardiomyopathy, unspecified: Secondary | ICD-10-CM | POA: Diagnosis present

## 2021-08-12 DIAGNOSIS — M50223 Other cervical disc displacement at C6-C7 level: Secondary | ICD-10-CM | POA: Diagnosis not present

## 2021-08-12 DIAGNOSIS — N289 Disorder of kidney and ureter, unspecified: Secondary | ICD-10-CM | POA: Diagnosis not present

## 2021-08-12 DIAGNOSIS — N184 Chronic kidney disease, stage 4 (severe): Secondary | ICD-10-CM | POA: Diagnosis present

## 2021-08-12 DIAGNOSIS — I5022 Chronic systolic (congestive) heart failure: Secondary | ICD-10-CM | POA: Diagnosis not present

## 2021-08-12 DIAGNOSIS — N1831 Chronic kidney disease, stage 3a: Secondary | ICD-10-CM | POA: Diagnosis not present

## 2021-08-12 DIAGNOSIS — B370 Candidal stomatitis: Secondary | ICD-10-CM | POA: Diagnosis not present

## 2021-08-12 DIAGNOSIS — M319 Necrotizing vasculopathy, unspecified: Secondary | ICD-10-CM | POA: Diagnosis present

## 2021-08-12 LAB — URINALYSIS, ROUTINE W REFLEX MICROSCOPIC
Bacteria, UA: NONE SEEN
Bilirubin Urine: NEGATIVE
Glucose, UA: NEGATIVE mg/dL
Ketones, ur: NEGATIVE mg/dL
Leukocytes,Ua: NEGATIVE
Nitrite: NEGATIVE
Protein, ur: 100 mg/dL — AB
Specific Gravity, Urine: 1.011 (ref 1.005–1.030)
Squamous Epithelial / HPF: NONE SEEN (ref 0–5)
pH: 6 (ref 5.0–8.0)

## 2021-08-12 LAB — C-REACTIVE PROTEIN: CRP: 3.3 mg/dL — ABNORMAL HIGH (ref <1.0)

## 2021-08-12 LAB — TROPONIN I (HIGH SENSITIVITY)
Troponin I (High Sensitivity): 126 ng/L (ref <18)
Troponin I (High Sensitivity): 106 ng/L (ref <18)
Troponin I (High Sensitivity): 106 ng/L (ref <18)

## 2021-08-12 LAB — BASIC METABOLIC PANEL WITH GFR
Anion gap: 10 (ref 5–15)
BUN: 124 mg/dL — ABNORMAL HIGH (ref 8–23)
CO2: 22 mmol/L (ref 22–32)
Calcium: 8.8 mg/dL — ABNORMAL LOW (ref 8.9–10.3)
Chloride: 102 mmol/L (ref 98–111)
Creatinine, Ser: 3.26 mg/dL — ABNORMAL HIGH (ref 0.61–1.24)
GFR, Estimated: 18 mL/min — ABNORMAL LOW
Glucose, Bld: 79 mg/dL (ref 70–99)
Potassium: 5 mmol/L (ref 3.5–5.1)
Sodium: 134 mmol/L — ABNORMAL LOW (ref 135–145)

## 2021-08-12 LAB — CK: Total CK: 2617 U/L — ABNORMAL HIGH (ref 49–397)

## 2021-08-12 LAB — HEPATITIS PANEL, ACUTE
HCV Ab: NONREACTIVE
Hep A IgM: NONREACTIVE
Hep B C IgM: NONREACTIVE
Hepatitis B Surface Ag: NONREACTIVE

## 2021-08-12 LAB — RESP PANEL BY RT-PCR (FLU A&B, COVID) ARPGX2
Influenza A by PCR: NEGATIVE
Influenza B by PCR: NEGATIVE
SARS Coronavirus 2 by RT PCR: NEGATIVE

## 2021-08-12 LAB — D-DIMER, QUANTITATIVE: D-Dimer, Quant: 5.38 ug/mL-FEU — ABNORMAL HIGH (ref 0.00–0.50)

## 2021-08-12 LAB — CBC
HCT: 29 % — ABNORMAL LOW (ref 39.0–52.0)
Hemoglobin: 9.5 g/dL — ABNORMAL LOW (ref 13.0–17.0)
MCH: 28.1 pg (ref 26.0–34.0)
MCHC: 32.8 g/dL (ref 30.0–36.0)
MCV: 85.8 fL (ref 80.0–100.0)
Platelets: 190 10*3/uL (ref 150–400)
RBC: 3.38 MIL/uL — ABNORMAL LOW (ref 4.22–5.81)
RDW: 15.4 % (ref 11.5–15.5)
WBC: 7.7 10*3/uL (ref 4.0–10.5)
nRBC: 0 % (ref 0.0–0.2)

## 2021-08-12 LAB — HEPATIC FUNCTION PANEL
ALT: 494 U/L — ABNORMAL HIGH (ref 0–44)
AST: 1049 U/L — ABNORMAL HIGH (ref 15–41)
Albumin: 2.6 g/dL — ABNORMAL LOW (ref 3.5–5.0)
Alkaline Phosphatase: 163 U/L — ABNORMAL HIGH (ref 38–126)
Bilirubin, Direct: 0.2 mg/dL (ref 0.0–0.2)
Indirect Bilirubin: 0.7 mg/dL (ref 0.3–0.9)
Total Bilirubin: 0.9 mg/dL (ref 0.3–1.2)
Total Protein: 6.7 g/dL (ref 6.5–8.1)

## 2021-08-12 LAB — SEDIMENTATION RATE: Sed Rate: 93 mm/hr — ABNORMAL HIGH (ref 0–20)

## 2021-08-12 MED ORDER — PANTOPRAZOLE SODIUM 40 MG PO TBEC
40.0000 mg | DELAYED_RELEASE_TABLET | Freq: Two times a day (BID) | ORAL | Status: DC
  Administered 2021-08-12 – 2021-08-24 (×23): 40 mg via ORAL
  Filled 2021-08-12 (×25): qty 1

## 2021-08-12 MED ORDER — ASPIRIN 81 MG PO CHEW
324.0000 mg | CHEWABLE_TABLET | Freq: Once | ORAL | Status: AC
  Administered 2021-08-12: 324 mg via ORAL
  Filled 2021-08-12: qty 4

## 2021-08-12 MED ORDER — SODIUM CHLORIDE 0.9 % IV SOLN: INTRAVENOUS | Status: DC

## 2021-08-12 MED ORDER — DEXTROSE 50 % IV SOLN
1.0000 | Freq: Once | INTRAVENOUS | Status: AC
  Administered 2021-08-12: 50 mL via INTRAVENOUS
  Filled 2021-08-12: qty 50

## 2021-08-12 MED ORDER — ONDANSETRON HCL 4 MG PO TABS: 4.0000 mg | ORAL_TABLET | Freq: Four times a day (QID) | ORAL | Status: DC | PRN

## 2021-08-12 MED ORDER — ACETAMINOPHEN 325 MG PO TABS
650.0000 mg | ORAL_TABLET | Freq: Four times a day (QID) | ORAL | Status: DC | PRN
  Administered 2021-08-13 – 2021-08-23 (×3): 650 mg via ORAL
  Filled 2021-08-12 (×4): qty 2

## 2021-08-12 MED ORDER — ATORVASTATIN CALCIUM 20 MG PO TABS: 40.0000 mg | ORAL_TABLET | Freq: Every day | ORAL | Status: DC

## 2021-08-12 MED ORDER — ONDANSETRON HCL 4 MG/2ML IJ SOLN: 4.0000 mg | Freq: Four times a day (QID) | INTRAMUSCULAR | Status: DC | PRN

## 2021-08-12 MED ORDER — ENOXAPARIN SODIUM 30 MG/0.3ML IJ SOSY
30.0000 mg | PREFILLED_SYRINGE | INTRAMUSCULAR | Status: DC
  Administered 2021-08-12 – 2021-08-15 (×3): 30 mg via SUBCUTANEOUS
  Filled 2021-08-12 (×3): qty 0.3

## 2021-08-12 MED ORDER — MORPHINE SULFATE (PF) 2 MG/ML IV SOLN
2.0000 mg | INTRAVENOUS | Status: DC | PRN
  Administered 2021-08-12 – 2021-08-20 (×10): 2 mg via INTRAVENOUS
  Filled 2021-08-12 (×10): qty 1

## 2021-08-12 MED ORDER — SODIUM CHLORIDE 0.9 % IV BOLUS (SEPSIS)
1000.0000 mL | Freq: Once | INTRAVENOUS | Status: AC
  Administered 2021-08-12: 1000 mL via INTRAVENOUS

## 2021-08-12 MED ORDER — MAGNESIUM HYDROXIDE 400 MG/5ML PO SUSP
30.0000 mL | Freq: Every day | ORAL | Status: DC | PRN
  Filled 2021-08-12: qty 30

## 2021-08-12 MED ORDER — TRAZODONE HCL 50 MG PO TABS
25.0000 mg | ORAL_TABLET | Freq: Every evening | ORAL | Status: DC | PRN
  Administered 2021-08-13 – 2021-08-21 (×8): 25 mg via ORAL
  Filled 2021-08-12 (×8): qty 1

## 2021-08-12 MED ORDER — INSULIN ASPART 100 UNIT/ML IJ SOLN
10.0000 [IU] | Freq: Once | INTRAMUSCULAR | Status: AC
  Administered 2021-08-12: 10 [IU] via INTRAVENOUS
  Filled 2021-08-12: qty 1

## 2021-08-12 MED ORDER — AMLODIPINE BESYLATE 10 MG PO TABS
10.0000 mg | ORAL_TABLET | Freq: Every day | ORAL | Status: DC
  Administered 2021-08-12 – 2021-08-13 (×2): 10 mg via ORAL
  Filled 2021-08-12: qty 2
  Filled 2021-08-12: qty 1

## 2021-08-12 MED ORDER — SODIUM CHLORIDE (PF) 0.9 % IJ SOLN: 10.0000 [IU] | Freq: Once | INTRAMUSCULAR | Status: DC

## 2021-08-12 MED ORDER — METOPROLOL SUCCINATE ER 50 MG PO TB24: 25.0000 mg | ORAL_TABLET | Freq: Every day | ORAL | Status: DC

## 2021-08-12 MED ORDER — TECHNETIUM TO 99M ALBUMIN AGGREGATED
4.0000 | Freq: Once | INTRAVENOUS | Status: AC | PRN
  Administered 2021-08-12: 4.43 via INTRAVENOUS

## 2021-08-12 MED ORDER — ENSURE ENLIVE PO LIQD
237.0000 mL | Freq: Two times a day (BID) | ORAL | Status: DC
  Administered 2021-08-13 – 2021-08-20 (×14): 237 mL via ORAL

## 2021-08-12 MED ORDER — ACETAMINOPHEN 650 MG RE SUPP
650.0000 mg | Freq: Four times a day (QID) | RECTAL | Status: DC | PRN
  Filled 2021-08-12: qty 1

## 2021-08-12 NOTE — ED Notes (Signed)
Pt changed into clean brief and chucks

## 2021-08-12 NOTE — H&P (Deleted)
[place]   PATIENT NAME: Daniel Bolton    MR#:  [id number]  DATE OF BIRTH:  06/12/1934  DATE OF ADMISSION:  08/11/2021  PRIMARY CARE PHYSICIAN: [name], MD   Patient is coming from: Home  REQUESTING/REFERRING PHYSICIAN: Ward, Delice Bison, DO  CHIEF COMPLAINT:   Chief Complaint  Patient presents with   Weakness   The patient is Spanish-speaking and his daughter was providing translation in the room  HISTORY OF PRESENT ILLNESS:  Daniel Bolton is a 87 y.o. Hispanic male with medical history significant for essential hypertension, CAD status post CABG, CHF, stage IIIa chronic kidney disease and CVA, who presented to the emergency room with acute onset   generalized weakness since Friday with diminished appetite and oral intake.  He denies any nausea or vomiting or abdominal pain or diarrhea.  No fever or chills.  No chest pain or dyspnea or cough or wheezing.  Her daughter is not sure how much oxygen is on at home.  He uses a walker at baseline.  He had a fall on Friday sliding down to the floor slowly per his daughter.  No head injury.  No paresthesias or focal muscle weakness.  He has right upper extremity swelling as well as bilateral lower extremity edema without pain.  He has been feeling weak enough to being unable to ambulate with his walker..  ED Course: Upon presentation to the emergency room, blood pressure was 144/49 with a heart rate of 50 with otherwise normal vital signs.  CBC showed hyponatremia of 130 and hypochloremia of 97 hyperkalemia 5.7 with a BUN of 118 and a creatinine of 3.4 up from 37/1.3 on 06/18/2021.  CBC showed anemia with hemoglobin 9.6 and hematocrit 29.7 above previous levels then.  UA showed 100 protein and was otherwise unremarkable.  High-sensitivity troponin I was 111 EKG as reviewed by me : EKG showed showed sinus bradycardia with a rate of 51 and first-degree AV block with T wave inversion laterally.  The patient was given 4  baby aspirin and 1 L bolus of IV normal saline over a couple of hours.  He will be admitted to a progressive unit bed for further evaluation and management. PAST MEDICAL HISTORY:   Past Medical History:  Diagnosis Date   CHF (congestive heart failure) ([place])    Chronic kidney disease    Hypertension    Hyponatremia    Stroke [facility])     PAST SURGICAL HISTORY:   Past Surgical History:  Procedure Laterality Date   BRAIN SURGERY     CARDIAC SURGERY     ESOPHAGOGASTRODUODENOSCOPY N/A 06/15/2021   Procedure: ESOPHAGOGASTRODUODENOSCOPY (EGD);  Surgeon: Annamaria Helling, DO;  Location: [facility] ENDOSCOPY;  Service: Gastroenterology;  Laterality: N/A;    SOCIAL HISTORY:   Social History   Tobacco Use   Smoking status: Never   Smokeless tobacco: Never  Substance Use Topics   Alcohol use: No    FAMILY HISTORY:   Family History  Problem Relation Age of Onset   Cancer Sister    Heart disease Sister     DRUG ALLERGIES:   Allergies  Allergen Reactions   Hydralazine     Pt has severe dizziness with it, and does not want to take it.    REVIEW OF SYSTEMS:   ROS As per history of present illness. All pertinent systems were reviewed above. Constitutional, HEENT, cardiovascular, respiratory, GI, GU, musculoskeletal, neuro, psychiatric, endocrine, integumentary and hematologic systems were reviewed  and are otherwise negative/unremarkable except for positive findings mentioned above in the HPI.   MEDICATIONS AT HOME:   Prior to Admission medications   Medication Sig Start Date End Date Taking? Authorizing Provider  amLODipine (NORVASC) 10 MG tablet Take 1 tablet (10 mg total) by mouth daily. 06/07/21  Yes Jennye Boroughs, MD  feeding supplement (ENSURE ENLIVE / ENSURE PLUS) LIQD Take 237 mLs by mouth 2 (two) times daily between meals. 04/14/21  Yes Enzo Bi, MD  furosemide (LASIX) 40 MG tablet Take 1 tablet (40 mg total) by mouth daily. 04/14/21 08/12/21 Yes Enzo Bi, MD  metoprolol  succinate (TOPROL-XL) 50 MG 24 hr tablet Take 0.5 tablets (25 mg total) by mouth daily. Take with or immediately following a meal. 06/06/21  Yes Jennye Boroughs, MD  olmesartan (BENICAR) 20 MG tablet Take 20 mg by mouth daily.   Yes [provider]  pantoprazole (PROTONIX) 40 MG tablet Take 1 tablet (40 mg total) by mouth 2 (two) times daily. 06/18/21  Yes Aline August, MD  simvastatin (ZOCOR) 80 MG tablet Take 80 mg by mouth daily.   Yes [provider]      VITAL SIGNS:  Blood pressure (!) 144/49, pulse (!) 50, temperature 97.8 F (36.6 C), temperature source Oral, resp. rate 18, height 5\' 1"  (1.549 m), weight 59.9 kg, SpO2 100 %.  PHYSICAL EXAMINATION:  Physical Exam  GENERAL:  87 y.o.-year-old Hispanic male patient lying in the bed with no acute distress.  EYES: Pupils equal, round, reactive to light and accommodation. No scleral icterus. Extraocular muscles intact.  HEENT: Head atraumatic, normocephalic. Oropharynx and nasopharynx clear.  NECK:  Supple, no jugular venous distention. No thyroid enlargement, no tenderness.  LUNGS: Normal breath sounds bilaterally, no wheezing, rales,rhonchi or crepitation. No use of accessory muscles of respiration.  CARDIOVASCULAR: Regular rate and rhythm, S1, S2 normal. No murmurs, rubs, or gallops.  ABDOMEN: Soft, nondistended, nontender. Bowel sounds present. No organomegaly or mass.  EXTREMITIES: Bilateral 1-2+ pitting lower extremity edema with no cyanosis, or clubbing.  Right upper extremity swelling without erythema or redness or warmth or tenderness. NEUROLOGIC: Cranial nerves II through XII are intact. Muscle strength 5/5 in all extremities. Sensation intact. Gait not checked.  PSYCHIATRIC: The patient is alert and oriented x 3.  Normal affect and good eye contact. SKIN: No obvious rash, lesion, or ulcer.   LABORATORY PANEL:   CBC Recent Labs  Lab 08/11/21 1930  WBC 8.7  HGB 9.6*  HCT 29.7*  PLT 190    ------------------------------------------------------------------------------------------------------------------  Chemistries  Recent Labs  Lab 08/11/21 1930  NA 130*  K 5.7*  CL 97*  CO2 23  GLUCOSE 100*  BUN 118*  CREATININE 3.42*  CALCIUM 8.8*   ------------------------------------------------------------------------------------------------------------------  Cardiac Enzymes No results for input(s): TROPONINI in the last 168 hours. ------------------------------------------------------------------------------------------------------------------  RADIOLOGY:  No results found.    IMPRESSION AND PLAN:  Active Problems:   Moderate major depression, single episode (HCC)   Coronary artery disease   Acute renal failure superimposed on stage 3b chronic kidney disease (HCC)   Normochromic normocytic anemia   Essential hypertension   Hyponatremia   Acute on chronic combined systolic and diastolic CHF (congestive heart failure) (HCC)   HLD (hyperlipidemia)   Hyperkalemia   AAA (abdominal aortic aneurysm)   Elevated troponin  1.  Acute kidney injury superimposed on stage IIIa chronic kidney disease with subsequent generalized weakness. - The patient will be admitted to a medical monitored bed. - She will be  hydrated with IV normal saline. - We will follow BMP. - We will hold nephrotoxins. - We will obtain renal ultrasound. - Nephrology consult to be obtained. - I notified Dr. Theador Hawthorne.  2.  Hyperkalemia. - We will hold off of Benicar. - We will give  3.  Elevated troponin I. - We will follow serial troponins. - This could be secondary to acute kidney injury. - Cardiology consult will be obtained in a.m. especially given some associated sinus bradycardia that could be symptomatic and contributing to generalized weakness. - Dr. Saralyn Pilar will be notified at 7 AM.  4.  Right upper extremity swelling.  He has elevated D-dimer. - Venous Doppler came back negative  for DVT. - This could be related to his acute kidney injury together with bilateral lower extremity edema. - LFTs are currently pending. - We will obtain a VQ scan though this elevated D-dimer is likely secondary to acute kidney injury  5.  Sinus bradycardia. - Toprol-XL will be held off and will monitor his heart rate.  6.  Essential hypertension. - We will continue  Norvasc and hold off Benicar and HCTZ as mentioned above.  7.  GERD. - We will continue PPI.  8.  Dyslipidemia. - We will continue statin therapy.   DVT prophylaxis: Lovenox.  Code Status: full code. Family Communication:  The plan of care was discussed in details with the patient (and family). I answered all questions. The patient agreed to proceed with the above mentioned plan. Further management will depend upon hospital course. Disposition Plan: Back to previous home environment Consults called: Nephrology.  All the records are reviewed and case discussed with ED provider.  Status is: Inpatient  Remains inpatient appropriate because:Ongoing diagnostic testing needed not appropriate for outpatient work up, Unsafe d/c plan, IV treatments appropriate due to intensity of illness or inability to take PO, and Inpatient level of care appropriate due to severity of illness   Dispo: The patient is from: Home              Anticipated d/c is to: Home              Patient currently is not medically stable to d/c.              Difficult to place patient: No  TOTAL TIME TAKING CARE OF THIS PATIENT: 55 minutes.     Christel Mormon M.D on 08/12/2021 at 12:41 AM  [facility]   From 7 PM-7 AM, contact night-coverage [email or web address]  CC: Primary care physician; [name], MD

## 2021-08-12 NOTE — Progress Notes (Addendum)
PROGRESS NOTE    Daniel Bolton  EUM:[id number] DOB: [birth year] DOA: 08/11/2021 PCP: [name], MD   Chief Complaint  Patient presents with   Weakness    Brief Narrative: 87 y.o. Hispanic male with medical history significant for essential hypertension, CAD status post CABG, CHF, chronic kidney disease and CVA, who presented to the emergency room with acute onset   generalized weakness since Friday with diminished appetite and oral intake.  He's been found to have AKI.  Assessment & Plan:   Active Problems:   Moderate major depression, single episode (HCC)   Coronary artery disease   Acute renal failure superimposed on stage 3b chronic kidney disease (HCC)   Normochromic normocytic anemia   Essential hypertension   Hyponatremia   Acute on chronic combined systolic and diastolic CHF (congestive heart failure) (HCC)   HLD (hyperlipidemia)   Hyperkalemia   AAA (abdominal aortic aneurysm)   Elevated troponin  Bilateral Lower Extremity Weakness  Generalized Weakness Unclear etiology, before Friday, up and walking with walker - after Friday, wasn't able to  Granddaughter noted incontinence of bowel as well as numbness in legs as well Weakness could be from neurologic causes vs rhabdo/myositis Will image spine with concern for incontinence and numbness  Per granddaughter, strength has improved since being in hospital  AKI  Hyperkalemia UA with hg, but no RBC Elevated LFT's Suspect rhabdo (urine myoglobin pending) Renal US without hydro Continue IVF (caution given HF) Follow CK Benicar and HCTZ on hold Appreciate renal assistance  Concern for possible rhabdo/myositis He's on amlodipine/simvastatin -> increased risk with dose he's on (sounds like he's been on this combination for Denney Shein while) Also with weakness, could have been in one position for Priya Matsen while Follow CK, sed rate, crp  Elevated D dimer  RUE Swelling No evidence of DVT in RUE VQ scan pending ->  indeterminate, follow LE [place]  HFrEF EF 25-30%, global hypokinesis 04/2021, grade II diastolic dysfunction, moderately reduced RVSF  Caution with IVF given HF  Right Pleural Effusion Will need follow up imaging  CAD with hx CABG  Elevated Troponin Aspirin, plavix still being held in setting of GI bleed (was supposed to hold until reeval), unclear if he's followed with cards outpatient in the interim No CP, appreciate cardiology assistance - suspect demand Appreciate cardiology assistance  Sinus Bradycardia Currently metoprolol on hold Concern that this may contribute to weakness, appreciate cardiology assistance  Hypertension Norvasc Holding benicar and HCTZ  HLD Holding simvastatin  GERD  Hx GI Bleed  Reflux Esophagitis  Gastric Ulcer  Erosive Gastropathy  Duodenal Ulcers PPI BID  DVT prophylaxis: lovenox Code Status: full Family Communication: Curator, son Disposition:   Status is: Inpatient  Remains inpatient appropriate because: need for further workup       Consultants:  Renal cardiology  Procedures No DVT in RUE  Antimicrobials:  Anti-infectives (From admission, onward)    None          Subjective: C/o pain in legs  Objective: Vitals:   08/12/21 0400 08/12/21 0500 08/12/21 0530 08/12/21 0600  BP: (!) 136/52 (!) 132/51 (!) 144/67 (!) 146/56  Pulse: (!) 48 (!) 54 (!) 53 (!) 53  Resp: 11 19 14 12   Temp:      TempSrc:      SpO2: 100% 100% 99% 100%  Weight:      Height:        Intake/Output Summary (Last 24 hours) at 08/12/2021 0916 Last data filed at 08/12/2021 0032 Gross  per 24 hour  Intake 300 ml  Output 250 ml  Net 50 ml   Filed Weights   08/11/21 1921  Weight: 59.9 kg    Examination:  General: No acute distress. Cardiovascular: Heart sounds show Kiele Heavrin regular rate, and rhythm.  Lungs: Clear to auscultation bilaterally  Abdomen: Soft, nontender, nondistended  Neurological: Alert and oriented 3. Moves all extremities  4 . Cranial nerves II through XII grossly intact.  5/5 strength to upper and lower extremities - no significant TTP to palpation. Extremities: No clubbing or cyanosis. No edema.  Data Reviewed: I have personally reviewed following labs and imaging studies  CBC: Recent Labs  Lab 08/11/21 1930 08/12/21 0648  WBC 8.7 7.7  HGB 9.6* 9.5*  HCT 29.7* 29.0*  MCV 84.6 85.8  PLT 190 998    Basic Metabolic Panel: Recent Labs  Lab 08/11/21 1930 08/12/21 0648  NA 130* 134*  K 5.7* 5.0  CL 97* 102  CO2 23 22  GLUCOSE 100* 79  BUN 118* 124*  CREATININE 3.42* 3.26*  CALCIUM 8.8* 8.8*    GFR: Estimated Creatinine Clearance: 12.3 mL/min (Fareedah Mahler) (by C-G formula based on SCr of 3.26 mg/dL (H)).  Liver Function Tests: Recent Labs  Lab 08/12/21 0006  AST 1,049*  ALT 494*  ALKPHOS 163*  BILITOT 0.9  PROT 6.7  ALBUMIN 2.6*    CBG: Recent Labs  Lab 08/11/21 1938  GLUCAP 95     Recent Results (from the past 240 hour(s))  Resp Panel by RT-PCR (Flu Jalaya Sarver&B, Covid) Nasopharyngeal Swab     Status: None   Collection Time: 08/12/21 12:03 AM   Specimen: Nasopharyngeal Swab; Nasopharyngeal(NP) swabs in vial transport medium  Result Value Ref Range Status   SARS Coronavirus 2 by RT PCR NEGATIVE NEGATIVE Final    Comment: (NOTE) SARS-CoV-2 target nucleic acids are NOT DETECTED.  The SARS-CoV-2 RNA is generally detectable in upper respiratory specimens during the acute phase of infection. The lowest concentration of SARS-CoV-2 viral copies this assay can detect is 138 copies/mL. Lil Lepage negative result does not preclude SARS-Cov-2 infection and should not be used as the sole basis for treatment or other patient management decisions. Yzabelle Calles negative result may occur with  improper specimen collection/handling, submission of specimen other than nasopharyngeal swab, presence of viral mutation(s) within the areas targeted by this assay, and inadequate number of viral copies(<138 copies/mL). Ginger Leeth negative  result must be combined with clinical observations, patient history, and epidemiological information. The expected result is Negative.  Fact Sheet for Patients:  EntrepreneurPulse.com.au  Fact Sheet for Healthcare Providers:  IncredibleEmployment.be  This test is no t yet approved or cleared by the [place] FDA and  has been authorized for detection and/or diagnosis of SARS-CoV-2 by FDA under an Emergency Use Authorization (EUA). This EUA will remain  in effect (meaning this test can be used) for the duration of the COVID-19 declaration under Section 564(b)(1) of the Act, 21 U.S.C.section 360bbb-3(b)(1), unless the authorization is terminated  or revoked sooner.       Influenza Etha Stambaugh by PCR NEGATIVE NEGATIVE Final   Influenza B by PCR NEGATIVE NEGATIVE Final    Comment: (NOTE) The Xpert Xpress SARS-CoV-2/FLU/RSV plus assay is intended as an aid in the diagnosis of influenza from Nasopharyngeal swab specimens and should not be used as Willow Reczek sole basis for treatment. Nasal washings and aspirates are unacceptable for Xpert Xpress SARS-CoV-2/FLU/RSV testing.  Fact Sheet for Patients: EntrepreneurPulse.com.au  Fact Sheet for Healthcare Providers: IncredibleEmployment.be  This  test is not yet approved or cleared by the [place] and has been authorized for detection and/or diagnosis of SARS-CoV-2 by FDA under an Emergency Use Authorization (EUA). This EUA will remain in effect (meaning this test can be used) for the duration of the COVID-19 declaration under Section 564(b)(1) of the Act, 21 U.S.C. section 360bbb-3(b)(1), unless the authorization is terminated or revoked.  Performed at [facility], [address] [place], [place] [address]          Radiology Studies: US RENAL  Result Date: 08/12/2021 CLINICAL DATA:  Acute renal insufficiency. EXAM: RENAL / URINARY TRACT ULTRASOUND  COMPLETE COMPARISON:  None. FINDINGS: Right Kidney: Renal measurements: 10.1 x 4.0 x 4.6 cm = volume: 96 mL. 6 there is diffuse increased renal parenchymal echogenicity. No hydronephrosis or shadowing stone. Left Kidney: Renal measurements: 9.1 x 4.8 x 3.3 cm = volume: 75 mL. Diffuse increased renal parenchymal echogenicity. No hydronephrosis or shadowing stone Bladder: Appears normal for degree of bladder distention. Other: The prostate gland measures 4.7 x 3.8 x 4.8 cm. Partially visualized right pleural effusion. There is coarsened appearance of the liver parenchyma. Shoni Quijas trace perihepatic free fluid may be present. IMPRESSION: 1. Echogenic kidneys, likely related to medical renal disease. No hydronephrosis or shadowing stone. 2. Partially visualized right pleural effusion. Electronically Signed   By: Anner Crete M.D.   On: 08/12/2021 03:44   US Venous Img Upper Uni Right(DVT)  Result Date: 08/12/2021 CLINICAL DATA:  Right upper extremity swelling. EXAM: Right UPPER EXTREMITY VENOUS DOPPLER ULTRASOUND TECHNIQUE: Gray-scale sonography with graded compression, as well as color Doppler and duplex ultrasound were performed to evaluate the upper extremity deep venous system from the level of the subclavian vein and including the jugular, axillary, basilic, radial, ulnar and upper cephalic vein. Spectral Doppler was utilized to evaluate flow at rest and with distal augmentation maneuvers. COMPARISON:  None. FINDINGS: Contralateral Subclavian Vein: Respiratory phasicity is normal and symmetric with the symptomatic side. No evidence of thrombus. Normal compressibility. Internal Jugular Vein: No evidence of thrombus. Normal compressibility, respiratory phasicity and response to augmentation. Subclavian Vein: No evidence of thrombus. Normal compressibility, respiratory phasicity and response to augmentation. Axillary Vein: No evidence of thrombus. Normal compressibility, respiratory phasicity and response to  augmentation. Cephalic Vein: No evidence of thrombus. Normal compressibility, respiratory phasicity and response to augmentation. Basilic Vein: No evidence of thrombus. Normal compressibility, respiratory phasicity and response to augmentation. Brachial Veins: No evidence of thrombus. Normal compressibility, respiratory phasicity and response to augmentation. Radial Veins: No evidence of thrombus. Normal compressibility, respiratory phasicity and response to augmentation. Ulnar Veins: No evidence of thrombus. Normal compressibility, respiratory phasicity and response to augmentation. Venous Reflux:  None visualized. Other Findings: There is diffuse subcutaneous edema of the right upper extremity. IMPRESSION: No evidence of DVT within the right upper extremity. Electronically Signed   By: Anner Crete M.D.   On: 08/12/2021 01:00   DG Chest Port 1 View  Result Date: 08/12/2021 CLINICAL DATA:  Shortness of breath EXAM: PORTABLE CHEST 1 VIEW COMPARISON:  Chest x-ray 06/14/2021 FINDINGS: Heart is enlarged. Mediastinum appears stable. Calcified plaques in the aortic arch. Pulmonary vascular congestion. Increased opacities at the left lung base may represent Arin Peral small effusion. No pneumothorax visualized. IMPRESSION: Cardiomegaly, pulmonary vascular congestion and possible small left pleural effusion. Electronically Signed   By: Ofilia Neas M.D.   On: 08/12/2021 08:00        Scheduled Meds:  amLODipine  10 mg Oral Daily  enoxaparin (LOVENOX) injection  30 mg Subcutaneous Q24H   feeding supplement  237 mL Oral BID BM   pantoprazole  40 mg Oral BID   Continuous Infusions:  sodium chloride 75 mL/hr at 08/12/21 0145     LOS: 0 days    Time spent: over 30 min    Fayrene Helper, MD [facility]   To contact the attending provider between 7A-7P or the covering provider during after hours 7P-7A, please log into the web site [email or web address] and access using universal [place] password  for that web site. If you do not have the password, please call the hospital operator.  08/12/2021, 9:16 AM

## 2021-08-12 NOTE — ED Notes (Signed)
Pt given the phone at this time.

## 2021-08-12 NOTE — ED Notes (Signed)
Patient transported to MRI 

## 2021-08-12 NOTE — Progress Notes (Signed)
[facility]  ROUNDING NOTE   Subjective:   Daniel Bolton is a 87 year old Hispanic male with a past medical history including CAD with CABG, diastolic CHF, hypertension, CVA, and chronic kidney disease stage IIIa.  Patient presents to the emergency room with complaints of generalized weakness and increased swelling.  Patient will be admitted for AKI (acute kidney injury) [facility]) [N17.9]  Patient is known to our office via previous hospitalizations and is followed in our office by Dr. Lanora Manis.  Patient is very drowsy at this time, son at bedside provides some HPI.  Son states father has become progressively weaker since Friday.  Swelling has greatly increased in all extremities including arms and legs.  Patient currently on 2 L nasal cannula, baseline.  Denies complaints of shortness of breath, cough or chest pain.  Denies nausea, vomiting, diarrhea, and abdominal pain.  Son states patient continues to take anti-inflammatories for generalized pain, but is uncertain of dosing.  Patient currently lives with her granddaughter.  Patient ambulates with a walker at baseline, unable to do so this past weekend.  Pertinent labs on admission include sodium 130, potassium 5.7, BUN 118, creatinine 3.42, albumin 2.6 and  eGFR 17.  Upper extremity Doppler negative for DVT.  Renal ultrasound negative for obstruction.  Chest x-ray shows pulmonary vascular congestion and a possible small left pleural effusion.  Lumbar spine MRI shows bulging disc, facet and ligamentous hypertrophy.  We have been consulted to evaluate acute kidney injury.   Objective:  Vital signs in last 24 hours:  Temp:  [97.8 F (36.6 C)-98.1 F (36.7 C)] 97.8 F (36.6 C) (11/06 1941) Pulse Rate:  [48-55] 53 (11/07 1145) Resp:  [11-19] 16 (11/07 1145) BP: (120-146)/(49-67) 133/55 (11/07 1145) SpO2:  [99 %-100 %] 100 % (11/07 1145) Weight:  [59.9 kg] 59.9 kg (11/06 1921)  Weight change:  Filed Weights   08/11/21 1921   Weight: 59.9 kg    Intake/Output: I/O last 3 completed shifts: In: 300 [I.V.:300] Out: 250 [Urine:250]   Intake/Output this shift:  No intake/output data recorded.  Physical Exam: General: NAD, resting comfortably  Head: Normocephalic, atraumatic. Moist oral mucosal membranes  Eyes: Anicteric  Lungs:  Clear to auscultation, normal effort, 2 L O2 [place]  Heart: Regular rate and rhythm  Abdomen:  Soft, nontender, nondistended  Extremities: 1+ peripheral edema.  Neurologic: Nonfocal, moving all four extremities  Skin: No lesions       Basic Metabolic Panel: Recent Labs  Lab 08/11/21 1930 08/12/21 0648  NA 130* 134*  K 5.7* 5.0  CL 97* 102  CO2 23 22  GLUCOSE 100* 79  BUN 118* 124*  CREATININE 3.42* 3.26*  CALCIUM 8.8* 8.8*    Liver Function Tests: Recent Labs  Lab 08/12/21 0006  AST 1,049*  ALT 494*  ALKPHOS 163*  BILITOT 0.9  PROT 6.7  ALBUMIN 2.6*   No results for input(s): LIPASE, AMYLASE in the last 168 hours. No results for input(s): AMMONIA in the last 168 hours.  CBC: Recent Labs  Lab 08/11/21 1930 08/12/21 0648  WBC 8.7 7.7  HGB 9.6* 9.5*  HCT 29.7* 29.0*  MCV 84.6 85.8  PLT 190 190    Cardiac Enzymes: Recent Labs  Lab 08/12/21 0826  CKTOTAL 2,617*    BNP: Invalid input(s): POCBNP  CBG: Recent Labs  Lab 08/11/21 1938  GLUCAP 95    Microbiology: Results for orders placed or performed during the hospital encounter of 08/11/21  Resp Panel by RT-PCR (Flu A&B,  Covid) Nasopharyngeal Swab     Status: None   Collection Time: 08/12/21 12:03 AM   Specimen: Nasopharyngeal Swab; Nasopharyngeal(NP) swabs in vial transport medium  Result Value Ref Range Status   SARS Coronavirus 2 by RT PCR NEGATIVE NEGATIVE Final    Comment: (NOTE) SARS-CoV-2 target nucleic acids are NOT DETECTED.  The SARS-CoV-2 RNA is generally detectable in upper respiratory specimens during the acute phase of infection. The lowest concentration of SARS-CoV-2  viral copies this assay can detect is 138 copies/mL. A negative result does not preclude SARS-Cov-2 infection and should not be used as the sole basis for treatment or other patient management decisions. A negative result may occur with  improper specimen collection/handling, submission of specimen other than nasopharyngeal swab, presence of viral mutation(s) within the areas targeted by this assay, and inadequate number of viral copies(<138 copies/mL). A negative result must be combined with clinical observations, patient history, and epidemiological information. The expected result is Negative.  Fact Sheet for Patients:  EntrepreneurPulse.com.au  Fact Sheet for Healthcare Providers:  IncredibleEmployment.be  This test is no t yet approved or cleared by the [place] FDA and  has been authorized for detection and/or diagnosis of SARS-CoV-2 by FDA under an Emergency Use Authorization (EUA). This EUA will remain  in effect (meaning this test can be used) for the duration of the COVID-19 declaration under Section 564(b)(1) of the Act, 21 U.S.C.section 360bbb-3(b)(1), unless the authorization is terminated  or revoked sooner.       Influenza A by PCR NEGATIVE NEGATIVE Final   Influenza B by PCR NEGATIVE NEGATIVE Final    Comment: (NOTE) The Xpert Xpress SARS-CoV-2/FLU/RSV plus assay is intended as an aid in the diagnosis of influenza from Nasopharyngeal swab specimens and should not be used as a sole basis for treatment. Nasal washings and aspirates are unacceptable for Xpert Xpress SARS-CoV-2/FLU/RSV testing.  Fact Sheet for Patients: EntrepreneurPulse.com.au  Fact Sheet for Healthcare Providers: IncredibleEmployment.be  This test is not yet approved or cleared by the [place] FDA and has been authorized for detection and/or diagnosis of SARS-CoV-2 by FDA under an Emergency Use Authorization  (EUA). This EUA will remain in effect (meaning this test can be used) for the duration of the COVID-19 declaration under Section 564(b)(1) of the Act, 21 U.S.C. section 360bbb-3(b)(1), unless the authorization is terminated or revoked.  Performed at [facility], [address] [place],  [address]     Coagulation Studies: No results for input(s): LABPROT, INR in the last 72 hours.  Urinalysis: Recent Labs    08/11/21 1930  COLORURINE YELLOW*  LABSPEC 1.011  PHURINE 6.0  GLUCOSEU NEGATIVE  HGBUR LARGE*  BILIRUBINUR NEGATIVE  KETONESUR NEGATIVE  PROTEINUR 100*  NITRITE NEGATIVE  LEUKOCYTESUR NEGATIVE      Imaging: MR LUMBAR SPINE WO CONTRAST  Result Date: 08/12/2021 CLINICAL DATA:  Acute onset of generalized weakness beginning 3 days ago EXAM: MRI LUMBAR SPINE WITHOUT CONTRAST TECHNIQUE: Multiplanar, multisequence MR imaging of the lumbar spine was performed. No intravenous contrast was administered. COMPARISON:  None. FINDINGS: Segmentation:  5 lumbar type vertebral bodies assumed. Alignment: Minimal scoliotic curvature. 2 mm degenerative anterolisthesis L4-5. Vertebrae:  No fracture or focal bone lesion. Conus medullaris and cauda equina: Conus extends to the L1-2 level. Conus and cauda equina appear normal. Paraspinal and other soft tissues: Negative Disc levels: T12-L1: Normal L1-2: Very tiny central disc protrusion with slight caudal down turning in the midline. No significant compressive effect upon the canal. Foramina  widely patent. L2-3: Bulging of the disc. Mild facet and ligamentous hypertrophy. Mild stenosis of both lateral recesses, not likely compressive. L3-4: Shallow protrusion of the disc. Facet and ligamentous hypertrophy. Moderate multifactorial stenosis at this level that could cause neural compression on either or both sides. L4-5: Chronic facet arthritis with 2 mm of degenerative anterolisthesis. Large broad-based disc herniation with upward  migration of disc material centrally and towards the left. Severe spinal stenosis at this level could cause neural compression on either or both sides. The upper migrated disc material on the left could focally compress the left L4 nerve. L5-S1: Endplate osteophytes and bulging of the disc. Facet and ligamentous hypertrophy. Stenosis of both subarticular lateral recesses that could compress either or both S1 nerves. IMPRESSION: At L4-5, there is facet arthropathy with 2 mm of anterolisthesis. There is a broad-based disc herniation with upward migration of extruded disc material centrally and towards the left behind L4. Severe spinal stenosis at the disc level could cause neural compression on either or both sides. The upwardly migrated disc material could focally compress the left L4 nerve. L3-4: Shallow disc protrusion. Facet and ligamentous hypertrophy. Moderate multifactorial stenosis that could cause neural compression on either or both sides. L5-S1: Bulging of the disc. Facet and ligamentous hypertrophy. Bilateral subarticular lateral recess narrowing that could possibly affect either S1 nerve. L2-3: Disc bulge. Mild facet hypertrophy. Mild narrowing of the lateral recesses without likely neural compression. Electronically Signed   By: Nelson Chimes M.D.   On: 08/12/2021 12:49   NM Pulmonary Perfusion  Result Date: 08/12/2021 CLINICAL DATA:  PE suspected.  Positive D-dimer. EXAM: NUCLEAR MEDICINE PERFUSION LUNG SCAN TECHNIQUE: Perfusion images were obtained in multiple projections after intravenous injection of radiopharmaceutical. Ventilation scans intentionally deferred if perfusion scan and chest x-ray adequate for interpretation during COVID 19 epidemic. RADIOPHARMACEUTICALS:  4.4 mCi Tc-58mMAA IV COMPARISON:  Chest x-ray 08/12/2021. FINDINGS: Multiplanar perfusion imaging shows scattered small areas of peripheral decreased perfusion in the mid and lower lungs bilaterally. Chest x-ray from earlier today  shows patchy basilar airspace disease, left greater than right with probable left effusion. IMPRESSION: Study is indeterminate for the presence of acute pulmonary embolus. Electronically Signed   By: EMisty StanleyM.D.   On: 08/12/2021 13:28   [place]  Result Date: 08/12/2021 CLINICAL DATA:  Acute renal insufficiency. EXAM: RENAL / URINARY TRACT ULTRASOUND COMPLETE COMPARISON:  None. FINDINGS: Right Kidney: Renal measurements: 10.1 x 4.0 x 4.6 cm = volume: 96 mL. 6 there is diffuse increased renal parenchymal echogenicity. No hydronephrosis or shadowing stone. Left Kidney: Renal measurements: 9.1 x 4.8 x 3.3 cm = volume: 75 mL. Diffuse increased renal parenchymal echogenicity. No hydronephrosis or shadowing stone Bladder: Appears normal for degree of bladder distention. Other: The prostate gland measures 4.7 x 3.8 x 4.8 cm. Partially visualized right pleural effusion. There is coarsened appearance of the liver parenchyma. A trace perihepatic free fluid may be present. IMPRESSION: 1. Echogenic kidneys, likely related to medical renal disease. No hydronephrosis or shadowing stone. 2. Partially visualized right pleural effusion. Electronically Signed   By: AAnner CreteM.D.   On: 08/12/2021 03:44   [place] Img Upper Uni Right(DVT)  Result Date: 08/12/2021 CLINICAL DATA:  Right upper extremity swelling. EXAM: Right UPPER EXTREMITY VENOUS DOPPLER ULTRASOUND TECHNIQUE: Gray-scale sonography with graded compression, as well as color Doppler and duplex ultrasound were performed to evaluate the upper extremity deep venous system from the level of the subclavian vein and including the  jugular, axillary, basilic, radial, ulnar and upper cephalic vein. Spectral Doppler was utilized to evaluate flow at rest and with distal augmentation maneuvers. COMPARISON:  None. FINDINGS: Contralateral Subclavian Vein: Respiratory phasicity is normal and symmetric with the symptomatic side. No evidence of thrombus. Normal  compressibility. Internal Jugular Vein: No evidence of thrombus. Normal compressibility, respiratory phasicity and response to augmentation. Subclavian Vein: No evidence of thrombus. Normal compressibility, respiratory phasicity and response to augmentation. Axillary Vein: No evidence of thrombus. Normal compressibility, respiratory phasicity and response to augmentation. Cephalic Vein: No evidence of thrombus. Normal compressibility, respiratory phasicity and response to augmentation. Basilic Vein: No evidence of thrombus. Normal compressibility, respiratory phasicity and response to augmentation. Brachial Veins: No evidence of thrombus. Normal compressibility, respiratory phasicity and response to augmentation. Radial Veins: No evidence of thrombus. Normal compressibility, respiratory phasicity and response to augmentation. Ulnar Veins: No evidence of thrombus. Normal compressibility, respiratory phasicity and response to augmentation. Venous Reflux:  None visualized. Other Findings: There is diffuse subcutaneous edema of the right upper extremity. IMPRESSION: No evidence of DVT within the right upper extremity. Electronically Signed   By: Anner Crete M.D.   On: 08/12/2021 01:00   DG Chest Port 1 View  Result Date: 08/12/2021 CLINICAL DATA:  Shortness of breath EXAM: PORTABLE CHEST 1 VIEW COMPARISON:  Chest x-ray 06/14/2021 FINDINGS: Heart is enlarged. Mediastinum appears stable. Calcified plaques in the aortic arch. Pulmonary vascular congestion. Increased opacities at the left lung base may represent a small effusion. No pneumothorax visualized. IMPRESSION: Cardiomegaly, pulmonary vascular congestion and possible small left pleural effusion. Electronically Signed   By: Ofilia Neas M.D.   On: 08/12/2021 08:00     Medications:    sodium chloride 75 mL/hr at 08/12/21 1058    amLODipine  10 mg Oral Daily   enoxaparin (LOVENOX) injection  30 mg Subcutaneous Q24H   feeding supplement  237 mL  Oral BID BM   pantoprazole  40 mg Oral BID   acetaminophen **OR** acetaminophen, magnesium hydroxide, morphine injection, ondansetron **OR** ondansetron (ZOFRAN) IV, traZODone  Assessment/ Plan:  Mr. Othon Guardia is a 87 y.o.  male with a past medical history including CAD with CABG, diastolic CHF, hypertension, CVA, and chronic kidney disease stage IIIa.  Patient is admitted for AKI (acute kidney injury) ([place]) [N17.9]   Acute Kidney Injury on chronic kidney disease stage 3a with baseline creatinine 1.3 and GFR of 54 on 06/18/21.  Acute kidney injury with unclear etiology at this time Risk factors include hypertension and diastolic heart failure. Olmasartan and furosemide held Renal ultrasound negative for obstruction Nuclear Medicine scan on 08/12/21 No indication for dialysis at this time Agree with IVF for hydration and monitor  Lab Results  Component Value Date   CREATININE 3.26 (H) 08/12/2021   CREATININE 3.42 (H) 08/11/2021   CREATININE 1.30 (H) 06/18/2021    Intake/Output Summary (Last 24 hours) at 08/12/2021 1428 Last data filed at 08/12/2021 0032 Gross per 24 hour  Intake 300 ml  Output 250 ml  Net 50 ml   2. Hypertension with chronic kidney disease Home regimen includes Amlodipine, olmesartan and furosemide Currently receiving Amlodipine only. BP 133/55  3.Diastolic chronic heart failure: Echo on 04/10/21 shows EF of 25-30%. Will monitor fluid status to prevent fluid overload.     LOS: 0   11/7/20222:28 PM

## 2021-08-12 NOTE — H&P (Signed)
[place]   PATIENT NAME: Daniel Bolton    MR#:  [id number]  DATE OF BIRTH:  31-Mar-1934  DATE OF ADMISSION:  08/11/2021  PRIMARY CARE PHYSICIAN: [name], MD   Patient is coming from: Home  REQUESTING/REFERRING PHYSICIAN: Ward, Delice Bison, DO  CHIEF COMPLAINT:   Chief Complaint  Patient presents with   Weakness   The patient is Spanish-speaking and his daughter was providing translation in the room  HISTORY OF PRESENT ILLNESS:  Daniel Bolton is a 87 y.o. Hispanic male with medical history significant for essential hypertension, CAD status post CABG, CHF, chronic kidney disease and CVA, who presented to the emergency room with acute onset   generalized weakness since Friday with diminished appetite and oral intake.  He denies any nausea or vomiting or abdominal pain or diarrhea.  No fever or chills.  No chest pain or dyspnea or cough or wheezing.  Her daughter is not sure how much oxygen is on at home.  He uses a walker at baseline.  He had a fall on Friday sliding down to the floor slowly per his daughter.  No head injury.  No paresthesias or focal muscle weakness.  He has right upper extremity swelling as well as bilateral lower extremity edema without pain.  He has been feeling weak enough to being unable to ambulate with his walker..  ED Course: Upon presentation to the emergency room, blood pressure was 144/49 with a heart rate of 50 with otherwise normal vital signs.  CBC showed hyponatremia of 130 and hypochloremia of 97 hyperkalemia 5.7 with a BUN of 118 and a creatinine of 3.4 up from 37/1.3 on 06/18/2021.  CBC showed anemia with hemoglobin 9.6 and hematocrit 29.7 above previous levels then.  UA showed 100 protein and was otherwise unremarkable.  High-sensitivity troponin I was 111 EKG as reviewed by me : EKG showed showed sinus bradycardia with a rate of 51 and first-degree AV block with T wave inversion laterally.  The patient was given 4 baby aspirin  and 1 L bolus of IV normal saline over a couple of hours.  He will be admitted to a progressive unit bed for further evaluation and management. PAST MEDICAL HISTORY:   Past Medical History:  Diagnosis Date   CHF (congestive heart failure) ([place])    Chronic kidney disease    Hypertension    Hyponatremia    Stroke [facility])     PAST SURGICAL HISTORY:   Past Surgical History:  Procedure Laterality Date   BRAIN SURGERY     CARDIAC SURGERY     ESOPHAGOGASTRODUODENOSCOPY N/A 06/15/2021   Procedure: ESOPHAGOGASTRODUODENOSCOPY (EGD);  Surgeon: Annamaria Helling, DO;  Location: [facility];  Service: Gastroenterology;  Laterality: N/A;    SOCIAL HISTORY:   Social History   Tobacco Use   Smoking status: Never   Smokeless tobacco: Never  Substance Use Topics   Alcohol use: No    FAMILY HISTORY:   Family History  Problem Relation Age of Onset   Cancer Sister    Heart disease Sister     DRUG ALLERGIES:   Allergies  Allergen Reactions   Hydralazine     Pt has severe dizziness with it, and does not want to take it.    REVIEW OF SYSTEMS:   ROS As per history of present illness. All pertinent systems were reviewed above. Constitutional, HEENT, cardiovascular, respiratory, GI, GU, musculoskeletal, neuro, psychiatric, endocrine, integumentary and hematologic systems were reviewed and are  otherwise negative/unremarkable except for positive findings mentioned above in the HPI.   MEDICATIONS AT HOME:   Prior to Admission medications   Medication Sig Start Date End Date Taking? Authorizing Provider  amLODipine (NORVASC) 10 MG tablet Take 1 tablet (10 mg total) by mouth daily. 06/07/21  Yes Jennye Boroughs, MD  feeding supplement (ENSURE ENLIVE / ENSURE PLUS) LIQD Take 237 mLs by mouth 2 (two) times daily between meals. 04/14/21  Yes Enzo Bi, MD  furosemide (LASIX) 40 MG tablet Take 1 tablet (40 mg total) by mouth daily. 04/14/21 08/12/21 Yes Enzo Bi, MD  metoprolol succinate  (TOPROL-XL) 50 MG 24 hr tablet Take 0.5 tablets (25 mg total) by mouth daily. Take with or immediately following a meal. 06/06/21  Yes Jennye Boroughs, MD  olmesartan (BENICAR) 20 MG tablet Take 20 mg by mouth daily.   Yes [provider]  pantoprazole (PROTONIX) 40 MG tablet Take 1 tablet (40 mg total) by mouth 2 (two) times daily. 06/18/21  Yes Aline August, MD  simvastatin (ZOCOR) 80 MG tablet Take 80 mg by mouth daily.   Yes [provider]      VITAL SIGNS:  Blood pressure (!) 144/49, pulse (!) 50, temperature 97.8 F (36.6 C), temperature source Oral, resp. rate 18, height 5\' 1"  (1.549 m), weight 59.9 kg, SpO2 100 %.  PHYSICAL EXAMINATION:  Physical Exam  GENERAL:  87 y.o.-year-old Hispanic male patient lying in the bed with no acute distress.  EYES: Pupils equal, round, reactive to light and accommodation. No scleral icterus. Extraocular muscles intact.  HEENT: Head atraumatic, normocephalic. Oropharynx and nasopharynx clear.  NECK:  Supple, no jugular venous distention. No thyroid enlargement, no tenderness.  LUNGS: Normal breath sounds bilaterally, no wheezing, rales,rhonchi or crepitation. No use of accessory muscles of respiration.  CARDIOVASCULAR: Regular rate and rhythm, S1, S2 normal. No murmurs, rubs, or gallops.  ABDOMEN: Soft, nondistended, nontender. Bowel sounds present. No organomegaly or mass.  EXTREMITIES: Bilateral 1-2+ pitting lower extremity edema with no cyanosis, or clubbing.  Right upper extremity swelling without erythema or redness or warmth or tenderness. NEUROLOGIC: Cranial nerves II through XII are intact. Muscle strength 5/5 in all extremities. Sensation intact. Gait not checked.  PSYCHIATRIC: The patient is alert and oriented x 3.  Normal affect and good eye contact. SKIN: No obvious rash, lesion, or ulcer.   LABORATORY PANEL:   CBC Recent Labs  Lab 08/11/21 1930  WBC 8.7  HGB 9.6*  HCT 29.7*  PLT 190     ------------------------------------------------------------------------------------------------------------------  Chemistries  Recent Labs  Lab 08/11/21 1930  NA 130*  K 5.7*  CL 97*  CO2 23  GLUCOSE 100*  BUN 118*  CREATININE 3.42*  CALCIUM 8.8*    ------------------------------------------------------------------------------------------------------------------  Cardiac Enzymes No results for input(s): TROPONINI in the last 168 hours. ------------------------------------------------------------------------------------------------------------------  RADIOLOGY:  US Venous Img Upper Uni Right(DVT)  Result Date: 08/12/2021 CLINICAL DATA:  Right upper extremity swelling. EXAM: Right UPPER EXTREMITY VENOUS DOPPLER ULTRASOUND TECHNIQUE: Gray-scale sonography with graded compression, as well as color Doppler and duplex ultrasound were performed to evaluate the upper extremity deep venous system from the level of the subclavian vein and including the jugular, axillary, basilic, radial, ulnar and upper cephalic vein. Spectral Doppler was utilized to evaluate flow at rest and with distal augmentation maneuvers. COMPARISON:  None. FINDINGS: Contralateral Subclavian Vein: Respiratory phasicity is normal and symmetric with the symptomatic side. No evidence of thrombus. Normal compressibility. Internal Jugular Vein: No evidence of thrombus. Normal compressibility,  respiratory phasicity and response to augmentation. Subclavian Vein: No evidence of thrombus. Normal compressibility, respiratory phasicity and response to augmentation. Axillary Vein: No evidence of thrombus. Normal compressibility, respiratory phasicity and response to augmentation. Cephalic Vein: No evidence of thrombus. Normal compressibility, respiratory phasicity and response to augmentation. Basilic Vein: No evidence of thrombus. Normal compressibility, respiratory phasicity and response to augmentation. Brachial Veins: No  evidence of thrombus. Normal compressibility, respiratory phasicity and response to augmentation. Radial Veins: No evidence of thrombus. Normal compressibility, respiratory phasicity and response to augmentation. Ulnar Veins: No evidence of thrombus. Normal compressibility, respiratory phasicity and response to augmentation. Venous Reflux:  None visualized. Other Findings: There is diffuse subcutaneous edema of the right upper extremity. IMPRESSION: No evidence of DVT within the right upper extremity. Electronically Signed   By: Anner Crete M.D.   On: 08/12/2021 01:00      IMPRESSION AND PLAN:  Active Problems:   Moderate major depression, single episode (HCC)   Coronary artery disease   Acute renal failure superimposed on stage 3b chronic kidney disease (HCC)   Normochromic normocytic anemia   Essential hypertension   Hyponatremia   Acute on chronic combined systolic and diastolic CHF (congestive heart failure) (HCC)   HLD (hyperlipidemia)   Hyperkalemia   AAA (abdominal aortic aneurysm)   Elevated troponin  1.  Acute kidney injury superimposed on stage IIIa chronic kidney disease with subsequent generalized weakness. - The patient will be admitted to a medical monitored bed. - She will be hydrated with IV normal saline. - We will follow BMP. - We will hold nephrotoxins. - We will obtain renal ultrasound. - Nephrology consult to be obtained. - I notified Dr. Theador Hawthorne.  2.  Hyperkalemia. - We will hold off of Benicar. - We will give D50 insulin and follow potassium level.  3.  Elevated troponin I. - We will follow serial troponins. - This could be secondary to acute kidney injury. - Cardiology consult will be obtained in a.m. especially given some associated sinus bradycardia that could be symptomatic and contributing to generalized weakness. - Dr. Saralyn Pilar will be notified at 7 AM.  4.  Right upper extremity swelling.  He has elevated D-dimer. - Venous Doppler came back  negative for DVT. - This could be related to his acute kidney injury together with bilateral lower extremity edema. - LFTs are currently pending. - We will obtain a VQ scan though this elevated D-dimer is likely secondary to acute kidney injury  5.  Sinus bradycardia. - Toprol-XL will be held off and will monitor his heart rate.  6.  Essential hypertension. - We will continue  Norvasc and hold off Benicar and HCTZ as mentioned above.  7.  GERD. - We will continue PPI.  8.  Dyslipidemia. - We will continue statin therapy.   DVT prophylaxis: Lovenox.  Code Status: full code. Family Communication:  The plan of care was discussed in details with the patient (and family). I answered all questions. The patient agreed to proceed with the above mentioned plan. Further management will depend upon hospital course. Disposition Plan: Back to previous home environment Consults called: Nephrology.  All the records are reviewed and case discussed with ED provider.  Status is: Inpatient  Remains inpatient appropriate because:Ongoing diagnostic testing needed not appropriate for outpatient work up, Unsafe d/c plan, IV treatments appropriate due to intensity of illness or inability to take PO, and Inpatient level of care appropriate due to severity of illness   Dispo: The patient is  from: Home              Anticipated d/c is to: Home              Patient currently is not medically stable to d/c.              Difficult to place patient: No  TOTAL TIME TAKING CARE OF THIS PATIENT: 55 minutes.     Christel Mormon M.D on 08/12/2021 at 2:31 AM  [facility]   From 7 PM-7 AM, contact night-coverage [email or web address]  CC: Primary care physician; [name], MD

## 2021-08-12 NOTE — Progress Notes (Signed)
Patient admitted to unit from ER in stable condition. Patient spanish speaking only.

## 2021-08-12 NOTE — ED Notes (Signed)
Pt ambulatory to the restroom at this time, with a walker and standby assistance. Pt had medium size BM and urinal emptied at this time. Family at bedside. No needs expressed at this time.

## 2021-08-12 NOTE — Consult Note (Signed)
Neurosurgery-New Consultation Evaluation 08/12/2021 Daniel Bolton [facility] [id number]  Identifying Statement: Daniel Bolton is a 87 y.o. male from [place] [id number] with weakness  Physician Requesting Consultation: [facility]  History of Present Illness: Daniel Bolton is here for evaluation of weakness over the past few days.  He is also had diminished appetite and oral intake.  In the emergency department he was found to have AKI with evidence of rhabdomyolysis.  He does have a history of heart failure and chronic kidney disease.  It was reported that he was also having some bowel incontinence.  Given all of these features, he was sent for an MRI of the lumbar spine there was finding of severe stenosis in the mid lumbar area.  Neurosurgery was consulted given the findings and symptoms.  Past Medical History:  Past Medical History:  Diagnosis Date   CHF (congestive heart failure) ([place])    Chronic kidney disease    Hypertension    Hyponatremia    Stroke [facility])     Social History: Social History   Socioeconomic History   Marital status: Married    Spouse name: Not on file   Number of children: Not on file   Years of education: Not on file   Highest education level: Not on file  Occupational History   Not on file  Tobacco Use   Smoking status: Never   Smokeless tobacco: Never  Substance and Sexual Activity   Alcohol use: No   Drug use: No   Sexual activity: Not on file  Other Topics Concern   Not on file  Social History Narrative   Not on file   Social Determinants of Health   Financial Resource Strain: Not on file  Food Insecurity: Not on file  Transportation Needs: Not on file  Physical Activity: Not on file  Stress: Not on file  Social Connections: Not on file  Intimate Partner Violence: Not on file   Living arrangements (living alone, with partner): Friend is in room, patient is Spanish-speaking  Family History: Family History  Problem Relation  Age of Onset   Cancer Sister    Heart disease Sister     Review of Systems:  Review of Systems - General ROS: Negative Psychological ROS: Negative Ophthalmic ROS: Negative ENT ROS: Negative Hematological and Lymphatic ROS: Negative  Endocrine ROS: Negative Respiratory ROS: Negative Cardiovascular ROS: Negative Gastrointestinal ROS: Negative Genito-Urinary ROS: Negative Musculoskeletal ROS: Positive for back pain Neurological ROS: Positive for leg pain Dermatological ROS: Negative  Physical Exam: BP (!) 133/55   Pulse (!) 53   Temp 97.8 F (36.6 C) (Oral)   Resp 16   Ht 5\' 1"  (1.549 m)   Wt 59.9 kg   SpO2 100%   BMI 24.94 kg/m  Body mass index is 24.94 kg/m. Body surface area is 1.61 meters squared. General appearance: Alert, cooperative, in no acute distress Head: Normocephalic, atraumatic Eyes: Normal, EOM intact Oropharynx: Moist without lesions Neck: Supple, no tenderness Heart: Normal, regular rate and rhythm, without murmur Lungs: Clear to auscultation, good air exchange Abdomen: Soft, nondistended Ext: Edema noted in bilateral lower extremities  Neurologic exam:  Mental status: alertness: alert, affect: normal Speech: fluent and clear Motor:strength symmetric 5/5 in bilateral hip flexion, knee extension, dorsiflexion, plantarflexion Sensory: intact to light touch in bilateral lower extremities, rectal tone is intact Gait: Not tested   Laboratory: Results for orders placed or performed during the hospital encounter of 08/11/21  Resp Panel by RT-PCR (Flu A&B, Covid)  Nasopharyngeal Swab   Specimen: Nasopharyngeal Swab; Nasopharyngeal(NP) swabs in vial transport medium  Result Value Ref Range   SARS Coronavirus 2 by RT PCR NEGATIVE NEGATIVE   Influenza A by PCR NEGATIVE NEGATIVE   Influenza B by PCR NEGATIVE NEGATIVE  Basic metabolic panel  Result Value Ref Range   Sodium 130 (L) 135 - 145 mmol/L   Potassium 5.7 (H) 3.5 - 5.1 mmol/L   Chloride 97 (L) 98  - 111 mmol/L   CO2 23 22 - 32 mmol/L   Glucose, Bld 100 (H) 70 - 99 mg/dL   BUN 118 (H) 8 - 23 mg/dL   Creatinine, Ser 3.42 (H) 0.61 - 1.24 mg/dL   Calcium 8.8 (L) 8.9 - 10.3 mg/dL   GFR, Estimated 17 (L) >60 mL/min   Anion gap 10 5 - 15  CBC  Result Value Ref Range   WBC 8.7 4.0 - 10.5 K/uL   RBC 3.51 (L) 4.22 - 5.81 MIL/uL   Hemoglobin 9.6 (L) 13.0 - 17.0 g/dL   HCT 29.7 (L) 39.0 - 52.0 %   MCV 84.6 80.0 - 100.0 fL   MCH 27.4 26.0 - 34.0 pg   MCHC 32.3 30.0 - 36.0 g/dL   RDW 15.4 11.5 - 15.5 %   Platelets 190 150 - 400 K/uL   nRBC 0.0 0.0 - 0.2 %  Urinalysis, Routine w reflex microscopic  Result Value Ref Range   Color, Urine YELLOW (A) YELLOW   APPearance CLEAR (A) CLEAR   Specific Gravity, Urine 1.011 1.005 - 1.030   pH 6.0 5.0 - 8.0   Glucose, UA NEGATIVE NEGATIVE mg/dL   Hgb urine dipstick LARGE (A) NEGATIVE   Bilirubin Urine NEGATIVE NEGATIVE   Ketones, ur NEGATIVE NEGATIVE mg/dL   Protein, ur 100 (A) NEGATIVE mg/dL   Nitrite NEGATIVE NEGATIVE   Leukocytes,Ua NEGATIVE NEGATIVE   RBC / HPF 0-5 0 - 5 RBC/hpf   WBC, UA 0-5 0 - 5 WBC/hpf   Bacteria, UA NONE SEEN NONE SEEN   Squamous Epithelial / LPF NONE SEEN 0 - 5  Hepatic function panel  Result Value Ref Range   Total Protein 6.7 6.5 - 8.1 g/dL   Albumin 2.6 (L) 3.5 - 5.0 g/dL   AST 1,049 (H) 15 - 41 U/L   ALT 494 (H) 0 - 44 U/L   Alkaline Phosphatase 163 (H) 38 - 126 U/L   Total Bilirubin 0.9 0.3 - 1.2 mg/dL   Bilirubin, Direct 0.2 0.0 - 0.2 mg/dL   Indirect Bilirubin 0.7 0.3 - 0.9 mg/dL  D-dimer, quantitative  Result Value Ref Range   D-Dimer, Quant 5.38 (H) 0.00 - 0.50 ug/mL-FEU  Basic metabolic panel  Result Value Ref Range   Sodium 134 (L) 135 - 145 mmol/L   Potassium 5.0 3.5 - 5.1 mmol/L   Chloride 102 98 - 111 mmol/L   CO2 22 22 - 32 mmol/L   Glucose, Bld 79 70 - 99 mg/dL   BUN 124 (H) 8 - 23 mg/dL   Creatinine, Ser 3.26 (H) 0.61 - 1.24 mg/dL   Calcium 8.8 (L) 8.9 - 10.3 mg/dL   GFR, Estimated  18 (L) >60 mL/min   Anion gap 10 5 - 15  CBC  Result Value Ref Range   WBC 7.7 4.0 - 10.5 K/uL   RBC 3.38 (L) 4.22 - 5.81 MIL/uL   Hemoglobin 9.5 (L) 13.0 - 17.0 g/dL   HCT 29.0 (L) 39.0 - 52.0 %   MCV 85.8 80.0 -  100.0 fL   MCH 28.1 26.0 - 34.0 pg   MCHC 32.8 30.0 - 36.0 g/dL   RDW 15.4 11.5 - 15.5 %   Platelets 190 150 - 400 K/uL   nRBC 0.0 0.0 - 0.2 %  CK  Result Value Ref Range   Total CK 2,617 (H) 49 - 397 U/L  C-reactive protein  Result Value Ref Range   CRP 3.3 (H) <1.0 mg/dL  Sedimentation rate  Result Value Ref Range   Sed Rate 93 (H) 0 - 20 mm/hr  Hepatitis panel, acute  Result Value Ref Range   Hepatitis B Surface Ag NON REACTIVE NON REACTIVE   HCV Ab PENDING NON REACTIVE   Hep A IgM PENDING NON REACTIVE   Hep B C IgM PENDING NON REACTIVE  CBG monitoring, ED  Result Value Ref Range   Glucose-Capillary 95 70 - 99 mg/dL  Type and screen [facility]  Result Value Ref Range   ABO/RH(D) O POS    Antibody Screen NEG    Sample Expiration      08/14/2021,2359 Performed at [facility], [address] [place], [place] [address]   Troponin I (High Sensitivity)  Result Value Ref Range   Troponin I (High Sensitivity) 111 (HH) <18 ng/L  Troponin I (High Sensitivity)  Result Value Ref Range   Troponin I (High Sensitivity) 126 (HH) <18 ng/L  Troponin I (High Sensitivity)  Result Value Ref Range   Troponin I (High Sensitivity) 106 (HH) <18 ng/L  Troponin I (High Sensitivity)  Result Value Ref Range   Troponin I (High Sensitivity) 106 (HH) <18 ng/L   I personally reviewed labs  Imaging: MRI Lumbar Spine:At L4-5, there is facet arthropathy with 2 mm of anterolisthesis. There is a broad-based disc herniation with upward migration of extruded disc material centrally and towards the left behind L4. Severe spinal stenosis at the disc level could cause neural compression on either or both sides. The upwardly migrated disc material could  focally compress the left L4 nerve.   L3-4: Shallow disc protrusion. Facet and ligamentous hypertrophy. Moderate multifactorial stenosis that could cause neural compression on either or both sides.   L5-S1: Bulging of the disc. Facet and ligamentous hypertrophy. Bilateral subarticular lateral recess narrowing that could possibly affect either S1 nerve.   L2-3: Disc bulge. Mild facet hypertrophy. Mild narrowing of the lateral recesses without likely neural compression.   Impression/Plan:  Daniel Bolton is here for evaluation of weakness but also in finding that he is dealing with acute kidney failure on top of chronic kidney failure with heart failure.  His exam for me shows intact strength and per patient, intact sensation.  He is having pain in the back and leg and I do think this is due to the stenosis we see on the MRI.  However, given his acute medical issues, I do think proceeding to surgery in a less urgent fashion is the best option for him but I do think he will need surgery for the lesion seen on the MRI.   1.  Diagnosis: Lumbar stenosis with radiculopathy.  2.  Plan -Continue with medical management and proceed with cardiac clearance -Can consider an L4-5 decompression when patient is medically stable

## 2021-08-12 NOTE — Consult Note (Signed)
CARDIOLOGY CONSULT NOTE               Patient ID: Daniel Bolton MRN: [id number] DOB/AGE: 85/03/1934 85 y.o.  Admit date: 08/11/2021 Referring Physician [facility] Primary Physician [facility] Primary Cardiologist Fath Reason for Consultation elevated troponin, sinus bradycardia  HPI: 87 year old gentleman referred for evaluation of elevated troponin and sinus bradycardia. The patient has a history of CABG x 3 in 2003, CVA, CKD stage IIIb, GI bleed, admitted 06/2021 at which time DAPT was discontinued, advanced COPD, hypertension, and hyperlipidemia. The patient is Spanish-speaking, and an interpreter assists during this encounter. The patient reports a recent history of generalized weakness, lower extremity numbness, incontinence, falls, decreased oral intake, worsening lower extremity edema, and "bone" pain in his upper and lower extremities. The patient's son at the bedside, states that the patient has been too weak to ambulate or even roll in bed due to pain. The patient has chronic shortness of breath, but denies any recent worsening. He denies chest pain or orthopnea. Admission labs are notable for high sensitivity troponin 111- 126- 106, sed rate 93, CK 2617, hemoglobin 9.5 and hematocrit 29, which appears stable, creatinine 3.42 (up from 1.3 two months prior), BUN 124, K 5.7, d-dimer 5.38, AST 1049, ALT 494, proteinuria. EKG showed sinus bradycardia at a rate of 51 bpm, with 1st degree AV block, with nonspecific T wave abnormalities. Chest xray showed pulmonary vascular congestion and possible small left pleural effusion. Right upper extremity doppler is negative for evidence of DVT. Previous 2D echocardiogram 04/2021 revealed LVEF 25-30% with mild to moderate mitral and tricupsid regurgitation.   Review of systems complete and found to be negative unless listed above     Past Medical History:  Diagnosis Date   CHF (congestive heart failure) ([place])    Chronic kidney disease     Hypertension    Hyponatremia    Stroke [facility])     Past Surgical History:  Procedure Laterality Date   BRAIN SURGERY     CARDIAC SURGERY     ESOPHAGOGASTRODUODENOSCOPY N/A 06/15/2021   Procedure: ESOPHAGOGASTRODUODENOSCOPY (EGD);  Surgeon: Annamaria Helling, DO;  Location: [facility] ENDOSCOPY;  Service: Gastroenterology;  Laterality: N/A;    (Not in a hospital admission)  Social History   Socioeconomic History   Marital status: Married    Spouse name: Not on file   Number of children: Not on file   Years of education: Not on file   Highest education level: Not on file  Occupational History   Not on file  Tobacco Use   Smoking status: Never   Smokeless tobacco: Never  Substance and Sexual Activity   Alcohol use: No   Drug use: No   Sexual activity: Not on file  Other Topics Concern   Not on file  Social History Narrative   Not on file   Social Determinants of Health   Financial Resource Strain: Not on file  Food Insecurity: Not on file  Transportation Needs: Not on file  Physical Activity: Not on file  Stress: Not on file  Social Connections: Not on file  Intimate Partner Violence: Not on file    Family History  Problem Relation Age of Onset   Cancer Sister    Heart disease Sister       Review of systems complete and found to be negative unless listed above      PHYSICAL EXAM  General: Frail elderly gentleman lying in bed in no acute distress HEENT:  Normocephalic and atramatic  Neck:  +JVD Lungs: bilateral crackles, no wheezing, normal effort of breathing on suppl O2 Heart: Regular rhythm, bradycardic. Normal S1 and S2 with 2/6 systolic murmur Msk:  no obvious deformities. Tenderness to palpitation of bilateral upper arms/shoulders Extremities: with mild bilateral lower extremity edema, with compression stockings..   Neuro: Alert and oriented X 3. Psych:  Good affect, responds appropriately  Labs:   Lab Results  Component Value Date   WBC 7.7  08/12/2021   HGB 9.5 (L) 08/12/2021   HCT 29.0 (L) 08/12/2021   MCV 85.8 08/12/2021   PLT 190 08/12/2021    Recent Labs  Lab 08/12/21 0006 08/12/21 0648  NA  --  134*  K  --  5.0  CL  --  102  CO2  --  22  BUN  --  124*  CREATININE  --  3.26*  CALCIUM  --  8.8*  PROT 6.7  --   BILITOT 0.9  --   ALKPHOS 163*  --   ALT 494*  --   AST 1,049*  --   GLUCOSE  --  79   Lab Results  Component Value Date   CKTOTAL 140 12/11/2020   TROPONINI 0.03 (HH) 12/29/2016    Lab Results  Component Value Date   CHOL 101 06/05/2021   Lab Results  Component Value Date   HDL 37 (L) 06/05/2021   Lab Results  Component Value Date   LDLCALC 59 06/05/2021   Lab Results  Component Value Date   TRIG 26 06/05/2021   Lab Results  Component Value Date   CHOLHDL 2.7 06/05/2021   No results found for: LDLDIRECT    Radiology: US RENAL  Result Date: 08/12/2021 CLINICAL DATA:  Acute renal insufficiency. EXAM: RENAL / URINARY TRACT ULTRASOUND COMPLETE COMPARISON:  None. FINDINGS: Right Kidney: Renal measurements: 10.1 x 4.0 x 4.6 cm = volume: 96 mL. 6 there is diffuse increased renal parenchymal echogenicity. No hydronephrosis or shadowing stone. Left Kidney: Renal measurements: 9.1 x 4.8 x 3.3 cm = volume: 75 mL. Diffuse increased renal parenchymal echogenicity. No hydronephrosis or shadowing stone Bladder: Appears normal for degree of bladder distention. Other: The prostate gland measures 4.7 x 3.8 x 4.8 cm. Partially visualized right pleural effusion. There is coarsened appearance of the liver parenchyma. A trace perihepatic free fluid may be present. IMPRESSION: 1. Echogenic kidneys, likely related to medical renal disease. No hydronephrosis or shadowing stone. 2. Partially visualized right pleural effusion. Electronically Signed   By: Anner Crete M.D.   On: 08/12/2021 03:44   US Venous Img Upper Uni Right(DVT)  Result Date: 08/12/2021 CLINICAL DATA:  Right upper extremity swelling. EXAM:  Right UPPER EXTREMITY VENOUS DOPPLER ULTRASOUND TECHNIQUE: Gray-scale sonography with graded compression, as well as color Doppler and duplex ultrasound were performed to evaluate the upper extremity deep venous system from the level of the subclavian vein and including the jugular, axillary, basilic, radial, ulnar and upper cephalic vein. Spectral Doppler was utilized to evaluate flow at rest and with distal augmentation maneuvers. COMPARISON:  None. FINDINGS: Contralateral Subclavian Vein: Respiratory phasicity is normal and symmetric with the symptomatic side. No evidence of thrombus. Normal compressibility. Internal Jugular Vein: No evidence of thrombus. Normal compressibility, respiratory phasicity and response to augmentation. Subclavian Vein: No evidence of thrombus. Normal compressibility, respiratory phasicity and response to augmentation. Axillary Vein: No evidence of thrombus. Normal compressibility, respiratory phasicity and response to augmentation. Cephalic Vein: No evidence of thrombus. Normal compressibility, respiratory phasicity and response to augmentation.  Basilic Vein: No evidence of thrombus. Normal compressibility, respiratory phasicity and response to augmentation. Brachial Veins: No evidence of thrombus. Normal compressibility, respiratory phasicity and response to augmentation. Radial Veins: No evidence of thrombus. Normal compressibility, respiratory phasicity and response to augmentation. Ulnar Veins: No evidence of thrombus. Normal compressibility, respiratory phasicity and response to augmentation. Venous Reflux:  None visualized. Other Findings: There is diffuse subcutaneous edema of the right upper extremity. IMPRESSION: No evidence of DVT within the right upper extremity. Electronically Signed   By: Anner Crete M.D.   On: 08/12/2021 01:00   DG Chest Port 1 View  Result Date: 08/12/2021 CLINICAL DATA:  Shortness of breath EXAM: PORTABLE CHEST 1 VIEW COMPARISON:  Chest x-ray  06/14/2021 FINDINGS: Heart is enlarged. Mediastinum appears stable. Calcified plaques in the aortic arch. Pulmonary vascular congestion. Increased opacities at the left lung base may represent a small effusion. No pneumothorax visualized. IMPRESSION: Cardiomegaly, pulmonary vascular congestion and possible small left pleural effusion. Electronically Signed   By: Ofilia Neas M.D.   On: 08/12/2021 08:00    EKG: sinus bradycardia, rate 51 bpm with 1st degree AV block  ASSESSMENT AND PLAN:  Elevated troponin (111- 126- 106- 106), down-trending, in the absence of chest pain, worsening shortness of breath, likely demand supply ischemia. Coronary artery disease, status post CABG x 3 in 2003, DAPT held due to recent GI bleed. Sinus bradycardia, known history; metoprolol is currently being held. Heart rate has been as low as 40s, documented at outpatient visits. Chronic systolic CHF, with LVEF 16-10%, appears mildly fluid overloaded. Chest xray show pulmonary vascular congestion and possible small left pleural effusion. AKI on CKD stage IIIb, creatinine increased to 3.42 from 1.3 in two months Possible rhabdomyositis, elevated CK, sed rate, transaminases, with generalized weakness and myalgias  Plan: Continue to hold DAPT in light of anemia Continue to hold AV nodal blocking agents in light of bradycardia Defer temporary or permament pacemaker at this time Defer further cardiac diagnostics at this time in the absence of chest pain, shortness of breath, and down-trending troponin Gentle hydration in light of HFrEF  Signed: Clabe Seal PA-C 08/12/2021, 8:28 AM

## 2021-08-13 ENCOUNTER — Inpatient Hospital Stay: Payer: Medicare HMO

## 2021-08-13 DIAGNOSIS — N179 Acute kidney failure, unspecified: Secondary | ICD-10-CM | POA: Diagnosis not present

## 2021-08-13 LAB — CBC WITH DIFFERENTIAL/PLATELET
Abs Immature Granulocytes: 0.05 10*3/uL (ref 0.00–0.07)
Basophils Absolute: 0 10*3/uL (ref 0.0–0.1)
Basophils Relative: 0 %
Eosinophils Absolute: 0.1 10*3/uL (ref 0.0–0.5)
Eosinophils Relative: 2 %
HCT: 27.3 % — ABNORMAL LOW (ref 39.0–52.0)
Hemoglobin: 9 g/dL — ABNORMAL LOW (ref 13.0–17.0)
Immature Granulocytes: 1 %
Lymphocytes Relative: 9 %
Lymphs Abs: 0.8 10*3/uL (ref 0.7–4.0)
MCH: 28.2 pg (ref 26.0–34.0)
MCHC: 33 g/dL (ref 30.0–36.0)
MCV: 85.6 fL (ref 80.0–100.0)
Monocytes Absolute: 0.5 10*3/uL (ref 0.1–1.0)
Monocytes Relative: 5 %
Neutro Abs: 7.9 10*3/uL — ABNORMAL HIGH (ref 1.7–7.7)
Neutrophils Relative %: 83 %
Platelets: 196 10*3/uL (ref 150–400)
RBC: 3.19 MIL/uL — ABNORMAL LOW (ref 4.22–5.81)
RDW: 15.6 % — ABNORMAL HIGH (ref 11.5–15.5)
WBC: 9.4 10*3/uL (ref 4.0–10.5)
nRBC: 0 % (ref 0.0–0.2)

## 2021-08-13 LAB — COMPREHENSIVE METABOLIC PANEL
ALT: 448 U/L — ABNORMAL HIGH (ref 0–44)
AST: 906 U/L — ABNORMAL HIGH (ref 15–41)
Albumin: 2.1 g/dL — ABNORMAL LOW (ref 3.5–5.0)
Alkaline Phosphatase: 167 U/L — ABNORMAL HIGH (ref 38–126)
Anion gap: 9 (ref 5–15)
BUN: 118 mg/dL — ABNORMAL HIGH (ref 8–23)
CO2: 20 mmol/L — ABNORMAL LOW (ref 22–32)
Calcium: 8.3 mg/dL — ABNORMAL LOW (ref 8.9–10.3)
Chloride: 105 mmol/L (ref 98–111)
Creatinine, Ser: 2.76 mg/dL — ABNORMAL HIGH (ref 0.61–1.24)
GFR, Estimated: 22 mL/min — ABNORMAL LOW (ref >60)
Glucose, Bld: 94 mg/dL (ref 70–99)
Potassium: 5.2 mmol/L — ABNORMAL HIGH (ref 3.5–5.1)
Sodium: 134 mmol/L — ABNORMAL LOW (ref 135–145)
Total Bilirubin: 0.7 mg/dL (ref 0.3–1.2)
Total Protein: 5.7 g/dL — ABNORMAL LOW (ref 6.5–8.1)

## 2021-08-13 LAB — C-REACTIVE PROTEIN: CRP: 5.4 mg/dL — ABNORMAL HIGH (ref <1.0)

## 2021-08-13 LAB — CK: Total CK: 3846 U/L — ABNORMAL HIGH (ref 49–397)

## 2021-08-13 LAB — MAGNESIUM: Magnesium: 2.3 mg/dL (ref 1.7–2.4)

## 2021-08-13 LAB — MYOGLOBIN, URINE: Myoglobin, Ur: 315 ng/mL — ABNORMAL HIGH (ref 0–13)

## 2021-08-13 LAB — SEDIMENTATION RATE: Sed Rate: >140 mm/hr — ABNORMAL HIGH (ref 0–20)

## 2021-08-13 LAB — TSH: TSH: 4.942 u[IU]/mL — ABNORMAL HIGH (ref 0.350–4.500)

## 2021-08-13 LAB — T4, FREE: Free T4: 1.06 ng/dL (ref 0.61–1.12)

## 2021-08-13 LAB — PHOSPHORUS: Phosphorus: 6.7 mg/dL — ABNORMAL HIGH (ref 2.5–4.6)

## 2021-08-13 MED ORDER — OXYCODONE HCL 5 MG PO TABS
5.0000 mg | ORAL_TABLET | Freq: Four times a day (QID) | ORAL | Status: DC | PRN
  Administered 2021-08-13 – 2021-08-23 (×15): 5 mg via ORAL
  Filled 2021-08-13 (×16): qty 1

## 2021-08-13 NOTE — Progress Notes (Addendum)
[facility] Cardiology    SUBJECTIVE: The patient denies chest pain or shortness of breath. His son at the bedside assists with the interview.   Vitals:   08/12/21 1700 08/12/21 1919 08/13/21 0010 08/13/21 0825  BP: (!) 135/52 136/60 (!) 129/55 (!) 137/59  Pulse: (!) 48 (!) 55 (!) 53 (!) 59  Resp: 20 18 16 18   Temp:  98.5 F (36.9 C) 98.5 F (36.9 C) (!) 97.4 F (36.3 C)  TempSrc:  Oral    SpO2: 100% 100% 100% 97%  Weight:      Height:        No intake or output data in the 24 hours ending 08/13/21 0946    PHYSICAL EXAM  General: Frail elderly gentleman lying in bed in no acute distress HEENT:  Normocephalic and atramatic Neck:  No JVD.  Lungs: Clear bilaterally to auscultation, normal effort of breathing on supp O2 Heart: HRRR . Normal S1 and S2 without gallops or murmurs.  Msk:  tenderness to palpation of bilateral upper arms Extremities: right upper arm edema.   Neuro: Alert and oriented  Psych:  Good affect, responds appropriately   LABS: Basic Metabolic Panel: Recent Labs    08/12/21 0648 08/13/21 0526  NA 134* 134*  K 5.0 5.2*  CL 102 105  CO2 22 20*  GLUCOSE 79 94  BUN 124* 118*  CREATININE 3.26* 2.76*  CALCIUM 8.8* 8.3*  MG  --  2.3  PHOS  --  6.7*   Liver Function Tests: Recent Labs    08/12/21 0006 08/13/21 0526  AST 1,049* 906*  ALT 494* 448*  ALKPHOS 163* 167*  BILITOT 0.9 0.7  PROT 6.7 5.7*  ALBUMIN 2.6* 2.1*   No results for input(s): LIPASE, AMYLASE in the last 72 hours. CBC: Recent Labs    08/12/21 0648 08/13/21 0526  WBC 7.7 9.4  NEUTROABS  --  7.9*  HGB 9.5* 9.0*  HCT 29.0* 27.3*  MCV 85.8 85.6  PLT 190 196   Cardiac Enzymes: Recent Labs    08/12/21 0826 08/13/21 0526  CKTOTAL 2,617* 3,846*   BNP: Invalid input(s): POCBNP D-Dimer: Recent Labs    08/12/21 0006  DDIMER 5.38*   Hemoglobin A1C: No results for input(s): HGBA1C in the last 72 hours. Fasting Lipid Panel: No results for input(s): CHOL, HDL, LDLCALC,  TRIG, CHOLHDL, LDLDIRECT in the last 72 hours. Thyroid Function Tests: No results for input(s): TSH, T4TOTAL, T3FREE, THYROIDAB in the last 72 hours.  Invalid input(s): FREET3 Anemia Panel: No results for input(s): VITAMINB12, FOLATE, FERRITIN, TIBC, IRON, RETICCTPCT in the last 72 hours.  MR LUMBAR SPINE WO CONTRAST  Result Date: 08/12/2021 CLINICAL DATA:  Acute onset of generalized weakness beginning 3 days ago EXAM: MRI LUMBAR SPINE WITHOUT CONTRAST TECHNIQUE: Multiplanar, multisequence MR imaging of the lumbar spine was performed. No intravenous contrast was administered. COMPARISON:  None. FINDINGS: Segmentation:  5 lumbar type vertebral bodies assumed. Alignment: Minimal scoliotic curvature. 2 mm degenerative anterolisthesis L4-5. Vertebrae:  No fracture or focal bone lesion. Conus medullaris and cauda equina: Conus extends to the L1-2 level. Conus and cauda equina appear normal. Paraspinal and other soft tissues: Negative Disc levels: T12-L1: Normal L1-2: Very tiny central disc protrusion with slight caudal down turning in the midline. No significant compressive effect upon the canal. Foramina widely patent. L2-3: Bulging of the disc. Mild facet and ligamentous hypertrophy. Mild stenosis of both lateral recesses, not likely compressive. L3-4: Shallow protrusion of the disc. Facet and ligamentous hypertrophy. Moderate multifactorial  stenosis at this level that could cause neural compression on either or both sides. L4-5: Chronic facet arthritis with 2 mm of degenerative anterolisthesis. Large broad-based disc herniation with upward migration of disc material centrally and towards the left. Severe spinal stenosis at this level could cause neural compression on either or both sides. The upper migrated disc material on the left could focally compress the left L4 nerve. L5-S1: Endplate osteophytes and bulging of the disc. Facet and ligamentous hypertrophy. Stenosis of both subarticular lateral recesses  that could compress either or both S1 nerves. IMPRESSION: At L4-5, there is facet arthropathy with 2 mm of anterolisthesis. There is a broad-based disc herniation with upward migration of extruded disc material centrally and towards the left behind L4. Severe spinal stenosis at the disc level could cause neural compression on either or both sides. The upwardly migrated disc material could focally compress the left L4 nerve. L3-4: Shallow disc protrusion. Facet and ligamentous hypertrophy. Moderate multifactorial stenosis that could cause neural compression on either or both sides. L5-S1: Bulging of the disc. Facet and ligamentous hypertrophy. Bilateral subarticular lateral recess narrowing that could possibly affect either S1 nerve. L2-3: Disc bulge. Mild facet hypertrophy. Mild narrowing of the lateral recesses without likely neural compression. Electronically Signed   By: Nelson Chimes M.D.   On: 08/12/2021 12:49   NM Pulmonary Perfusion  Result Date: 08/12/2021 CLINICAL DATA:  PE suspected.  Positive D-dimer. EXAM: NUCLEAR MEDICINE PERFUSION LUNG SCAN TECHNIQUE: Perfusion images were obtained in multiple projections after intravenous injection of radiopharmaceutical. Ventilation scans intentionally deferred if perfusion scan and chest x-ray adequate for interpretation during COVID 19 epidemic. RADIOPHARMACEUTICALS:  4.4 mCi Tc-11m MAA IV COMPARISON:  Chest x-ray 08/12/2021. FINDINGS: Multiplanar perfusion imaging shows scattered small areas of peripheral decreased perfusion in the mid and lower lungs bilaterally. Chest x-ray from earlier today shows patchy basilar airspace disease, left greater than right with probable left effusion. IMPRESSION: Study is indeterminate for the presence of acute pulmonary embolus. Electronically Signed   By: Misty Stanley M.D.   On: 08/12/2021 13:28   US RENAL  Result Date: 08/12/2021 CLINICAL DATA:  Acute renal insufficiency. EXAM: RENAL / URINARY TRACT ULTRASOUND COMPLETE  COMPARISON:  None. FINDINGS: Right Kidney: Renal measurements: 10.1 x 4.0 x 4.6 cm = volume: 96 mL. 6 there is diffuse increased renal parenchymal echogenicity. No hydronephrosis or shadowing stone. Left Kidney: Renal measurements: 9.1 x 4.8 x 3.3 cm = volume: 75 mL. Diffuse increased renal parenchymal echogenicity. No hydronephrosis or shadowing stone Bladder: Appears normal for degree of bladder distention. Other: The prostate gland measures 4.7 x 3.8 x 4.8 cm. Partially visualized right pleural effusion. There is coarsened appearance of the liver parenchyma. A trace perihepatic free fluid may be present. IMPRESSION: 1. Echogenic kidneys, likely related to medical renal disease. No hydronephrosis or shadowing stone. 2. Partially visualized right pleural effusion. Electronically Signed   By: Anner Crete M.D.   On: 08/12/2021 03:44   US Venous Img Lower Bilateral (DVT)  Result Date: 08/12/2021 CLINICAL DATA:  Positive D-dimer concern for DVT EXAM: BILATERAL LOWER EXTREMITY VENOUS DOPPLER ULTRASOUND TECHNIQUE: Gray-scale sonography with graded compression, as well as color Doppler and duplex ultrasound were performed to evaluate the lower extremity deep venous systems from the level of the common femoral vein and including the common femoral, femoral, profunda femoral, popliteal and calf veins including the posterior tibial, peroneal and gastrocnemius veins when visible. The superficial great saphenous vein was also interrogated. Spectral Doppler was utilized  to evaluate flow at rest and with distal augmentation maneuvers in the common femoral, femoral and popliteal veins. COMPARISON:  None. FINDINGS: RIGHT LOWER EXTREMITY Common Femoral Vein: No evidence of thrombus. Normal compressibility, respiratory phasicity and response to augmentation. Saphenofemoral Junction: No evidence of thrombus. Normal compressibility and flow on color Doppler imaging. Profunda Femoral Vein: No evidence of thrombus. Normal  compressibility and flow on color Doppler imaging. Femoral Vein: No evidence of thrombus. Normal compressibility, respiratory phasicity and response to augmentation. Popliteal Vein: No evidence of thrombus. Normal compressibility, respiratory phasicity and response to augmentation. Calf Veins: No evidence of thrombus. Normal compressibility and flow on color Doppler imaging. Superficial Great Saphenous Vein: No evidence of thrombus. Normal compressibility. Venous Reflux:  None. Other Findings:  None. LEFT LOWER EXTREMITY Common Femoral Vein: No evidence of thrombus. Normal compressibility, respiratory phasicity and response to augmentation. Saphenofemoral Junction: No evidence of thrombus. Normal compressibility and flow on color Doppler imaging. Profunda Femoral Vein: No evidence of thrombus. Normal compressibility and flow on color Doppler imaging. Femoral Vein: No evidence of thrombus. Normal compressibility, respiratory phasicity and response to augmentation. Popliteal Vein: No evidence of thrombus. Normal compressibility, respiratory phasicity and response to augmentation. Calf Veins: No evidence of thrombus. Normal compressibility and flow on color Doppler imaging. Superficial Great Saphenous Vein: No evidence of thrombus. Normal compressibility. Venous Reflux:  None. Other Findings:  None. IMPRESSION: No evidence of deep venous thrombosis in either lower extremity. Electronically Signed   By: Dahlia Bailiff M.D.   On: 08/12/2021 19:18   US Venous Img Upper Uni Right(DVT)  Result Date: 08/12/2021 CLINICAL DATA:  Right upper extremity swelling. EXAM: Right UPPER EXTREMITY VENOUS DOPPLER ULTRASOUND TECHNIQUE: Gray-scale sonography with graded compression, as well as color Doppler and duplex ultrasound were performed to evaluate the upper extremity deep venous system from the level of the subclavian vein and including the jugular, axillary, basilic, radial, ulnar and upper cephalic vein. Spectral Doppler was  utilized to evaluate flow at rest and with distal augmentation maneuvers. COMPARISON:  None. FINDINGS: Contralateral Subclavian Vein: Respiratory phasicity is normal and symmetric with the symptomatic side. No evidence of thrombus. Normal compressibility. Internal Jugular Vein: No evidence of thrombus. Normal compressibility, respiratory phasicity and response to augmentation. Subclavian Vein: No evidence of thrombus. Normal compressibility, respiratory phasicity and response to augmentation. Axillary Vein: No evidence of thrombus. Normal compressibility, respiratory phasicity and response to augmentation. Cephalic Vein: No evidence of thrombus. Normal compressibility, respiratory phasicity and response to augmentation. Basilic Vein: No evidence of thrombus. Normal compressibility, respiratory phasicity and response to augmentation. Brachial Veins: No evidence of thrombus. Normal compressibility, respiratory phasicity and response to augmentation. Radial Veins: No evidence of thrombus. Normal compressibility, respiratory phasicity and response to augmentation. Ulnar Veins: No evidence of thrombus. Normal compressibility, respiratory phasicity and response to augmentation. Venous Reflux:  None visualized. Other Findings: There is diffuse subcutaneous edema of the right upper extremity. IMPRESSION: No evidence of DVT within the right upper extremity. Electronically Signed   By: Anner Crete M.D.   On: 08/12/2021 01:00   DG Chest Port 1 View  Result Date: 08/12/2021 CLINICAL DATA:  Shortness of breath EXAM: PORTABLE CHEST 1 VIEW COMPARISON:  Chest x-ray 06/14/2021 FINDINGS: Heart is enlarged. Mediastinum appears stable. Calcified plaques in the aortic arch. Pulmonary vascular congestion. Increased opacities at the left lung base may represent a small effusion. No pneumothorax visualized. IMPRESSION: Cardiomegaly, pulmonary vascular congestion and possible small left pleural effusion. Electronically Signed   By:  Elberta Fortis  Jimmye Norman M.D.   On: 08/12/2021 08:00     Echo LVEF 25-30%   ASSESSMENT AND PLAN:  Active Problems:   Moderate major depression, single episode (HCC)   Coronary artery disease   AKI (acute kidney injury) ([place])   Normochromic normocytic anemia   Essential hypertension   Hyponatremia   Acute on chronic combined systolic and diastolic CHF (congestive heart failure) (HCC)   HLD (hyperlipidemia)   Hyperkalemia   AAA (abdominal aortic aneurysm)   Elevated troponin    Elevated troponin (111- 126- 106- 106), down-trending, in the absence of chest pain, worsening shortness of breath, likely demand supply ischemia. Coronary artery disease, status post CABG x 3 in 2003, DAPT held due to recent GI bleed. Sinus bradycardia, known history; metoprolol is currently being held. Heart rate has been as low as 40s, documented at outpatient visits. Chronic systolic CHF, with LVEF 34-74%, appears mildly fluid overloaded. Chest xray show pulmonary vascular congestion and possible small left pleural effusion. AKI on CKD stage IIIb, creatinine increased to 3.42 from 1.3 in two months. Lasix and olmesartan on hold. Rhabdomyolysis, elevated CK, sed rate, transaminases, with generalized weakness and myalgias. 7.   Preoperative cardiovascular evaluation for potential L4-5 decompression of lumbar stenosis with radiculopathy, the patient is not currently medically optimized due to underlying AKI on CKD, recent GI bleed, and bradycardia. The patient is a high risk surgical candidate. Recommend as minimally invasive approach as possible, and deferring surgery until patient is medically stable.   Plan: Agree with current plan Monitor closely for evidence of volume overload Continue to hold DAPT at this time in light of anemia/recent GI bleed Defer further cardiac diagnostics at this time Recommend minimally invasive surgical approach. Resume low dose metoprolol perioperatively and avoid fluid overload.   Follow up with Dr. Ubaldo Glassing in 1 week from discharge as outpatient.   Sign off for now; please call/[facility] with any questions.   Clabe Seal, PA-C 08/13/2021 9:46 AM Discussed with Dr. Saralyn Pilar and the plan was made in collaboration with him.

## 2021-08-13 NOTE — Evaluation (Addendum)
Physical Therapy Evaluation Patient Details Name: Daniel Bolton MRN: [id number] DOB: 08-07-1934 Today's Date: 08/13/2021  History of Present Illness  87 y.o. Hispanic male with medical history significant for essential hypertension, CAD status post CABG, CHF, chronic kidney disease and CVA, who presented to the emergency room with acute onset   generalized weakness since Friday with diminished appetite and oral intake.  He denies any nausea or vomiting or abdominal pain or diarrhea.  No fever or chills.  No chest pain or dyspnea or cough or wheezing.  Her daughter is not sure how much oxygen is on at home.  He uses a walker at baseline.  He had a fall on Friday sliding down to the floor slowly per his daughter.  No head injury.  No paresthesias or focal muscle weakness.  He has right upper extremity swelling. MRI results show several protruding disc in lumbar region.  Clinical Impression  Pt alert, oriented x 3, with family present throughout treatment. MD clearance for therapy due to high potassium value (5.1). Spanish interpretor utilized during treatment. Pt states 0/10 pain at rest but increases to 9/10 with mobility OOB from previous OT session. Pt provided medication during treatment. PLOF was MOD I w/ RW and demonstrates significant decline in functional mobility. Pt also notes bilateral hand numbness and tingling with overall decreased ROM in BUE. Pt required MAX A for coming to EOB but was able to progress to standing and taking small steps to [facility]. HR monitored throughout treatment, 65 bpm standing remaining asymptomatic for dizziness. Due to change from PLOF and decreased caregiver support SNF is primary recommendation to remain functional mobility. Skilled PT intervention is indicated to address deficits in function, mobility, and to return to PLOF as able.     Recommendations for follow up therapy are one component of a multi-disciplinary discharge planning process, led by the attending  physician.  Recommendations may be updated based on patient status, additional functional criteria and insurance authorization.  Follow Up Recommendations Skilled nursing-short term rehab (<3 hours/day)    Assistance Recommended at Discharge Frequent or constant Supervision/Assistance  Functional Status Assessment    Equipment Recommendations  Other (comment) (TBD next venue of care)    Recommendations for Other Services       Precautions / Restrictions Precautions Precautions: Back Precaution Comments: for comfort Restrictions Weight Bearing Restrictions: No      Mobility  Bed Mobility Overal bed mobility: Needs Assistance Bed Mobility: Supine to Sit;Sit to Supine     Supine to sit: Max assist;HOB elevated Sit to supine: Max assist   General bed mobility comments: cued for log roll but pt stating unable to despite cues    Transfers Overall transfer level: Needs assistance Equipment used: Rolling walker (2 wheels) Transfers: Sit to/from Stand Sit to Stand: From elevated surface;Mod assist           General transfer comment: No LOB or instability upon standing, HR monitored remained at 65, decreases to 55 bpm;    Ambulation/Gait Ambulation/Gait assistance: Min guard   Assistive device: Rolling walker (2 wheels) Gait Pattern/deviations: Step-to pattern       General Gait Details: able to stand for 2 minutes and step forward, side to side without physical assist; fatigued with stepping  Stairs            Wheelchair Mobility    Modified Rankin (Stroke Patients Only)       Balance Overall balance assessment: Needs assistance Sitting-balance support: Feet supported;Bilateral upper extremity supported  Sitting balance-Leahy Scale: Fair Sitting balance - Comments: close CGA but no physical assist provided,   Standing balance support: Bilateral upper extremity supported Standing balance-Leahy Scale: Poor Standing balance comment: BUE support  required                             Pertinent Vitals/Pain Pain Assessment: 0-10 Pain Score: 9  Pain Location: low back and BUE with movement 0/10 at rest Pain Descriptors / Indicators: Aching;Discomfort;Grimacing;Guarding Pain Intervention(s): Limited activity within patient's tolerance;Monitored during session;Repositioned;RN gave pain meds during session    Home Living Family/patient expects to be discharged to:: Private residence Living Arrangements: Alone Available Help at Discharge: Family;Available PRN/intermittently Type of Home: Mobile home Home Access: Stairs to enter Entrance Stairs-Rails: Right Entrance Stairs-Number of Steps: 4   Home Layout: One level Home Equipment: Cane - single point;Rolling Walker (2 wheels) Additional Comments: Pt lives alone . Son and Daughter present and report "everyone works" but they assist intermittently as needed.    Prior Function Prior Level of Function : Independent/Modified Independent             Mobility Comments: Pt uses a RW for amb w/ MOD-I ADLs Comments: Daughters assist w/ food prep, groceries, and take to medical appointments     Hand Dominance   Dominant Hand: Right    Extremity/Trunk Assessment   Upper Extremity Assessment Upper Extremity Assessment: Generalized weakness RUE Deficits / Details: Bilateral n/t to hands (started ~ 3 months ago, unable to verify reasoning), pt unable to lift arms > 60 degrees w/ pain RUE: Unable to fully assess due to pain    Lower Extremity Assessment Lower Extremity Assessment: Generalized weakness (able to bend knees in bed but not maintain contraction (possibly due to poor communication w/ interpretation, no grimacing or signs of pain))       Communication   Communication: HOH;Interpreter utilized;Prefers language other than English  Cognition Arousal/Alertness: Awake/alert Behavior During Therapy: WFL for tasks assessed/performed Overall Cognitive Status:  Difficult to assess                                 General Comments: Translator was unable to interpret full conversation at multiple points, oriented to self, and hospital        General Comments      Exercises     Assessment/Plan    PT Assessment Patient needs continued PT services  PT Problem List Decreased strength;Decreased range of motion;Decreased activity tolerance;Decreased balance;Decreased mobility       PT Treatment Interventions Gait training;Stair training;Functional mobility training;Therapeutic exercise;Balance training    PT Goals (Current goals can be found in the Care Plan section)  Acute Rehab PT Goals Patient Stated Goal: to decrease pain PT Goal Formulation: With patient Time For Goal Achievement: 08/27/21 Potential to Achieve Goals: Fair    Frequency Min 2X/week   Barriers to discharge        Co-evaluation               AM-PAC PT "6 Clicks" Mobility  Outcome Measure Help needed turning from your back to your side while in a flat bed without using bedrails?: A Lot Help needed moving from lying on your back to sitting on the side of a flat bed without using bedrails?: A Lot Help needed moving to and from a bed to a chair (including a wheelchair)?: A Lot  Help needed standing up from a chair using your arms (e.g., wheelchair or bedside chair)?: A Lot Help needed to walk in hospital room?: A Lot Help needed climbing 3-5 steps with a railing? : Total 6 Click Score: 11    End of Session Equipment Utilized During Treatment: Gait belt Activity Tolerance: Patient limited by fatigue Patient left: in bed;with call bell/phone within reach;with family/visitor present;with bed alarm set Nurse Communication: Mobility status PT Visit Diagnosis: Other abnormalities of gait and mobility (R26.89);Muscle weakness (generalized) (M62.81);Difficulty in walking, not elsewhere classified (R26.2)    Time: 0354-6568 PT Time Calculation (min)  (ACUTE ONLY): 33 min   Charges:              [employer], SPT

## 2021-08-13 NOTE — Progress Notes (Signed)
[facility]  ROUNDING NOTE   Subjective:   Daniel Bolton is a 87 year old Hispanic male with a past medical history including CAD with CABG, diastolic CHF, hypertension, CVA, and chronic kidney disease stage IIIa.  Patient presents to the emergency room with complaints of generalized weakness and increased swelling.  Patient will be admitted for Shortness of breath [R06.02] Hyperkalemia [E87.5] Positive D dimer [R79.89] Elevated troponin [R77.8] Swelling of arm [M79.89] Generalized weakness [R53.1] AKI (acute kidney injury) ([place]) [N17.9]  Patient is known to our office via previous hospitalizations and is followed in our office by Dr. Lanora Manis.    Patient seen more alert Son at bedside Tolerating meals Creatinine improved Normal Saline @ 75 ml/hr  Objective:  Vital signs in last 24 hours:  Temp:  [97.4 F (36.3 C)-98.5 F (36.9 C)] 97.4 F (36.3 C) (11/08 0825) Pulse Rate:  [48-59] 59 (11/08 0825) Resp:  [16-20] 18 (11/08 0825) BP: (129-142)/(52-60) 137/59 (11/08 0825) SpO2:  [97 %-100 %] 97 % (11/08 0825)  Weight change:  Filed Weights   08/11/21 1921  Weight: 59.9 kg    Intake/Output: I/O last 3 completed shifts: In: 300 [I.V.:300] Out: 250 [Urine:250]   Intake/Output this shift:  Total I/O In: 480 [P.O.:480] Out: 500 [Urine:500]  Physical Exam: General: NAD, resting comfortably  Head: Normocephalic, atraumatic. Moist oral mucosal membranes  Eyes: Anicteric  Lungs:  Clear to auscultation, normal effort, 2 L O2 [place]  Heart: Regular rate and rhythm  Abdomen:  Soft, nontender, nondistended  Extremities: 1+ peripheral edema.  Neurologic: Nonfocal, moving all four extremities  Skin: No lesions       Basic Metabolic Panel: Recent Labs  Lab 08/11/21 1930 08/12/21 0648 08/13/21 0526  NA 130* 134* 134*  K 5.7* 5.0 5.2*  CL 97* 102 105  CO2 23 22 20*  GLUCOSE 100* 79 94  BUN 118* 124* 118*  CREATININE 3.42* 3.26* 2.76*  CALCIUM 8.8*  8.8* 8.3*  MG  --   --  2.3  PHOS  --   --  6.7*     Liver Function Tests: Recent Labs  Lab 08/12/21 0006 08/13/21 0526  AST 1,049* 906*  ALT 494* 448*  ALKPHOS 163* 167*  BILITOT 0.9 0.7  PROT 6.7 5.7*  ALBUMIN 2.6* 2.1*    No results for input(s): LIPASE, AMYLASE in the last 168 hours. No results for input(s): AMMONIA in the last 168 hours.  CBC: Recent Labs  Lab 08/11/21 1930 08/12/21 0648 08/13/21 0526  WBC 8.7 7.7 9.4  NEUTROABS  --   --  7.9*  HGB 9.6* 9.5* 9.0*  HCT 29.7* 29.0* 27.3*  MCV 84.6 85.8 85.6  PLT 190 190 196     Cardiac Enzymes: Recent Labs  Lab 08/12/21 0826 08/13/21 0526  CKTOTAL 2,617* 3,846*     BNP: Invalid input(s): POCBNP  CBG: Recent Labs  Lab 08/11/21 [place] 95     Microbiology: Results for orders placed or performed during the hospital encounter of 08/11/21  Resp Panel by RT-PCR (Flu A&B, Covid) Nasopharyngeal Swab     Status: None   Collection Time: 08/12/21 12:03 AM   Specimen: Nasopharyngeal Swab; Nasopharyngeal(NP) swabs in vial transport medium  Result Value Ref Range Status   SARS Coronavirus 2 by RT PCR NEGATIVE NEGATIVE Final    Comment: (NOTE) SARS-CoV-2 target nucleic acids are NOT DETECTED.  The SARS-CoV-2 RNA is generally detectable in upper respiratory specimens during the acute phase of infection. The lowest concentration  of SARS-CoV-2 viral copies this assay can detect is 138 copies/mL. A negative result does not preclude SARS-Cov-2 infection and should not be used as the sole basis for treatment or other patient management decisions. A negative result may occur with  improper specimen collection/handling, submission of specimen other than nasopharyngeal swab, presence of viral mutation(s) within the areas targeted by this assay, and inadequate number of viral copies(<138 copies/mL). A negative result must be combined with clinical observations, patient history, and  epidemiological information. The expected result is Negative.  Fact Sheet for Patients:  EntrepreneurPulse.com.au  Fact Sheet for Healthcare Providers:  IncredibleEmployment.be  This test is no t yet approved or cleared by the [place] FDA and  has been authorized for detection and/or diagnosis of SARS-CoV-2 by FDA under an Emergency Use Authorization (EUA). This EUA will remain  in effect (meaning this test can be used) for the duration of the COVID-19 declaration under Section 564(b)(1) of the Act, 21 U.S.C.section 360bbb-3(b)(1), unless the authorization is terminated  or revoked sooner.       Influenza A by PCR NEGATIVE NEGATIVE Final   Influenza B by PCR NEGATIVE NEGATIVE Final    Comment: (NOTE) The Xpert Xpress SARS-CoV-2/FLU/RSV plus assay is intended as an aid in the diagnosis of influenza from Nasopharyngeal swab specimens and should not be used as a sole basis for treatment. Nasal washings and aspirates are unacceptable for Xpert Xpress SARS-CoV-2/FLU/RSV testing.  Fact Sheet for Patients: EntrepreneurPulse.com.au  Fact Sheet for Healthcare Providers: IncredibleEmployment.be  This test is not yet approved or cleared by the [place] FDA and has been authorized for detection and/or diagnosis of SARS-CoV-2 by FDA under an Emergency Use Authorization (EUA). This EUA will remain in effect (meaning this test can be used) for the duration of the COVID-19 declaration under Section 564(b)(1) of the Act, 21 U.S.C. section 360bbb-3(b)(1), unless the authorization is terminated or revoked.  Performed at [facility], [address] [place], [place] [address]     Coagulation Studies: No results for input(s): LABPROT, INR in the last 72 hours.  Urinalysis: Recent Labs    08/11/21 1930  COLORURINE YELLOW*  LABSPEC 1.011  PHURINE 6.0  GLUCOSEU NEGATIVE  HGBUR LARGE*   BILIRUBINUR NEGATIVE  KETONESUR NEGATIVE  PROTEINUR 100*  NITRITE NEGATIVE  LEUKOCYTESUR NEGATIVE       Imaging: MR LUMBAR SPINE WO CONTRAST  Result Date: 08/12/2021 CLINICAL DATA:  Acute onset of generalized weakness beginning 3 days ago EXAM: MRI LUMBAR SPINE WITHOUT CONTRAST TECHNIQUE: Multiplanar, multisequence MR imaging of the lumbar spine was performed. No intravenous contrast was administered. COMPARISON:  None. FINDINGS: Segmentation:  5 lumbar type vertebral bodies assumed. Alignment: Minimal scoliotic curvature. 2 mm degenerative anterolisthesis L4-5. Vertebrae:  No fracture or focal bone lesion. Conus medullaris and cauda equina: Conus extends to the L1-2 level. Conus and cauda equina appear normal. Paraspinal and other soft tissues: Negative Disc levels: T12-L1: Normal L1-2: Very tiny central disc protrusion with slight caudal down turning in the midline. No significant compressive effect upon the canal. Foramina widely patent. L2-3: Bulging of the disc. Mild facet and ligamentous hypertrophy. Mild stenosis of both lateral recesses, not likely compressive. L3-4: Shallow protrusion of the disc. Facet and ligamentous hypertrophy. Moderate multifactorial stenosis at this level that could cause neural compression on either or both sides. L4-5: Chronic facet arthritis with 2 mm of degenerative anterolisthesis. Large broad-based disc herniation with upward migration of disc material centrally and towards the left. Severe spinal stenosis  at this level could cause neural compression on either or both sides. The upper migrated disc material on the left could focally compress the left L4 nerve. L5-S1: Endplate osteophytes and bulging of the disc. Facet and ligamentous hypertrophy. Stenosis of both subarticular lateral recesses that could compress either or both S1 nerves. IMPRESSION: At L4-5, there is facet arthropathy with 2 mm of anterolisthesis. There is a broad-based disc herniation with upward  migration of extruded disc material centrally and towards the left behind L4. Severe spinal stenosis at the disc level could cause neural compression on either or both sides. The upwardly migrated disc material could focally compress the left L4 nerve. L3-4: Shallow disc protrusion. Facet and ligamentous hypertrophy. Moderate multifactorial stenosis that could cause neural compression on either or both sides. L5-S1: Bulging of the disc. Facet and ligamentous hypertrophy. Bilateral subarticular lateral recess narrowing that could possibly affect either S1 nerve. L2-3: Disc bulge. Mild facet hypertrophy. Mild narrowing of the lateral recesses without likely neural compression. Electronically Signed   By: Nelson Chimes M.D.   On: 08/12/2021 12:49   NM Pulmonary Perfusion  Result Date: 08/12/2021 CLINICAL DATA:  PE suspected.  Positive D-dimer. EXAM: NUCLEAR MEDICINE PERFUSION LUNG SCAN TECHNIQUE: Perfusion images were obtained in multiple projections after intravenous injection of radiopharmaceutical. Ventilation scans intentionally deferred if perfusion scan and chest x-ray adequate for interpretation during COVID 19 epidemic. RADIOPHARMACEUTICALS:  4.4 mCi Tc-62m MAA IV COMPARISON:  Chest x-ray 08/12/2021. FINDINGS: Multiplanar perfusion imaging shows scattered small areas of peripheral decreased perfusion in the mid and lower lungs bilaterally. Chest x-ray from earlier today shows patchy basilar airspace disease, left greater than right with probable left effusion. IMPRESSION: Study is indeterminate for the presence of acute pulmonary embolus. Electronically Signed   By: Misty Stanley M.D.   On: 08/12/2021 13:28   US RENAL  Result Date: 08/12/2021 CLINICAL DATA:  Acute renal insufficiency. EXAM: RENAL / URINARY TRACT ULTRASOUND COMPLETE COMPARISON:  None. FINDINGS: Right Kidney: Renal measurements: 10.1 x 4.0 x 4.6 cm = volume: 96 mL. 6 there is diffuse increased renal parenchymal echogenicity. No  hydronephrosis or shadowing stone. Left Kidney: Renal measurements: 9.1 x 4.8 x 3.3 cm = volume: 75 mL. Diffuse increased renal parenchymal echogenicity. No hydronephrosis or shadowing stone Bladder: Appears normal for degree of bladder distention. Other: The prostate gland measures 4.7 x 3.8 x 4.8 cm. Partially visualized right pleural effusion. There is coarsened appearance of the liver parenchyma. A trace perihepatic free fluid may be present. IMPRESSION: 1. Echogenic kidneys, likely related to medical renal disease. No hydronephrosis or shadowing stone. 2. Partially visualized right pleural effusion. Electronically Signed   By: Anner Crete M.D.   On: 08/12/2021 03:44   US Venous Img Lower Bilateral (DVT)  Result Date: 08/12/2021 CLINICAL DATA:  Positive D-dimer concern for DVT EXAM: BILATERAL LOWER EXTREMITY VENOUS DOPPLER ULTRASOUND TECHNIQUE: Gray-scale sonography with graded compression, as well as color Doppler and duplex ultrasound were performed to evaluate the lower extremity deep venous systems from the level of the common femoral vein and including the common femoral, femoral, profunda femoral, popliteal and calf veins including the posterior tibial, peroneal and gastrocnemius veins when visible. The superficial great saphenous vein was also interrogated. Spectral Doppler was utilized to evaluate flow at rest and with distal augmentation maneuvers in the common femoral, femoral and popliteal veins. COMPARISON:  None. FINDINGS: RIGHT LOWER EXTREMITY Common Femoral Vein: No evidence of thrombus. Normal compressibility, respiratory phasicity and response to augmentation. Saphenofemoral Junction:  No evidence of thrombus. Normal compressibility and flow on color Doppler imaging. Profunda Femoral Vein: No evidence of thrombus. Normal compressibility and flow on color Doppler imaging. Femoral Vein: No evidence of thrombus. Normal compressibility, respiratory phasicity and response to augmentation.  Popliteal Vein: No evidence of thrombus. Normal compressibility, respiratory phasicity and response to augmentation. Calf Veins: No evidence of thrombus. Normal compressibility and flow on color Doppler imaging. Superficial Great Saphenous Vein: No evidence of thrombus. Normal compressibility. Venous Reflux:  None. Other Findings:  None. LEFT LOWER EXTREMITY Common Femoral Vein: No evidence of thrombus. Normal compressibility, respiratory phasicity and response to augmentation. Saphenofemoral Junction: No evidence of thrombus. Normal compressibility and flow on color Doppler imaging. Profunda Femoral Vein: No evidence of thrombus. Normal compressibility and flow on color Doppler imaging. Femoral Vein: No evidence of thrombus. Normal compressibility, respiratory phasicity and response to augmentation. Popliteal Vein: No evidence of thrombus. Normal compressibility, respiratory phasicity and response to augmentation. Calf Veins: No evidence of thrombus. Normal compressibility and flow on color Doppler imaging. Superficial Great Saphenous Vein: No evidence of thrombus. Normal compressibility. Venous Reflux:  None. Other Findings:  None. IMPRESSION: No evidence of deep venous thrombosis in either lower extremity. Electronically Signed   By: Dahlia Bailiff M.D.   On: 08/12/2021 19:18   US Venous Img Upper Uni Right(DVT)  Result Date: 08/12/2021 CLINICAL DATA:  Right upper extremity swelling. EXAM: Right UPPER EXTREMITY VENOUS DOPPLER ULTRASOUND TECHNIQUE: Gray-scale sonography with graded compression, as well as color Doppler and duplex ultrasound were performed to evaluate the upper extremity deep venous system from the level of the subclavian vein and including the jugular, axillary, basilic, radial, ulnar and upper cephalic vein. Spectral Doppler was utilized to evaluate flow at rest and with distal augmentation maneuvers. COMPARISON:  None. FINDINGS: Contralateral Subclavian Vein: Respiratory phasicity is normal  and symmetric with the symptomatic side. No evidence of thrombus. Normal compressibility. Internal Jugular Vein: No evidence of thrombus. Normal compressibility, respiratory phasicity and response to augmentation. Subclavian Vein: No evidence of thrombus. Normal compressibility, respiratory phasicity and response to augmentation. Axillary Vein: No evidence of thrombus. Normal compressibility, respiratory phasicity and response to augmentation. Cephalic Vein: No evidence of thrombus. Normal compressibility, respiratory phasicity and response to augmentation. Basilic Vein: No evidence of thrombus. Normal compressibility, respiratory phasicity and response to augmentation. Brachial Veins: No evidence of thrombus. Normal compressibility, respiratory phasicity and response to augmentation. Radial Veins: No evidence of thrombus. Normal compressibility, respiratory phasicity and response to augmentation. Ulnar Veins: No evidence of thrombus. Normal compressibility, respiratory phasicity and response to augmentation. Venous Reflux:  None visualized. Other Findings: There is diffuse subcutaneous edema of the right upper extremity. IMPRESSION: No evidence of DVT within the right upper extremity. Electronically Signed   By: Anner Crete M.D.   On: 08/12/2021 01:00   DG Chest Port 1 View  Result Date: 08/12/2021 CLINICAL DATA:  Shortness of breath EXAM: PORTABLE CHEST 1 VIEW COMPARISON:  Chest x-ray 06/14/2021 FINDINGS: Heart is enlarged. Mediastinum appears stable. Calcified plaques in the aortic arch. Pulmonary vascular congestion. Increased opacities at the left lung base may represent a small effusion. No pneumothorax visualized. IMPRESSION: Cardiomegaly, pulmonary vascular congestion and possible small left pleural effusion. Electronically Signed   By: Ofilia Neas M.D.   On: 08/12/2021 08:00     Medications:    sodium chloride 75 mL/hr at 08/13/21 0931    amLODipine  10 mg Oral Daily   enoxaparin  (LOVENOX) injection  30 mg Subcutaneous Q24H  feeding supplement  237 mL Oral BID BM   pantoprazole  40 mg Oral BID   acetaminophen **OR** acetaminophen, magnesium hydroxide, morphine injection, ondansetron **OR** ondansetron (ZOFRAN) IV, traZODone  Assessment/ Plan:  [name] is a 87 y.o.  male with a past medical history including CAD with CABG, diastolic CHF, hypertension, CVA, and chronic kidney disease stage IIIa.  Patient is admitted for Shortness of breath [R06.02] Hyperkalemia [E87.5] Positive D dimer [R79.89] Elevated troponin [R77.8] Swelling of arm [M79.89] Generalized weakness [R53.1] AKI (acute kidney injury) ([place]) [N17.9]   Acute Kidney Injury on chronic kidney disease stage 3a with baseline creatinine 1.3 and GFR of 54 on 06/18/21.  Acute kidney injury with unclear etiology at this time Risk factors include hypertension and diastolic heart failure. Olmasartan and furosemide held Renal ultrasound negative for obstruction. No acute need for dialysis Creatinine improved with IVF Continue IV hydration and will monitor renal function  Lab Results  Component Value Date   CREATININE 2.76 (H) 08/13/2021   CREATININE 3.26 (H) 08/12/2021   CREATININE 3.42 (H) 08/11/2021    Intake/Output Summary (Last 24 hours) at 08/13/2021 1058 Last data filed at 08/13/2021 1010 Gross per 24 hour  Intake 480 ml  Output 500 ml  Net -20 ml    2. Hypertension with chronic kidney disease Home regimen includes Amlodipine, olmesartan and furosemide Amlodipine ordered inpatient.. BP 137/59  3.Diastolic chronic heart failure: Echo on 04/10/21 shows EF of 25-30%. BLE edema remains unchanged from yesterday, continue fluid with monitoring    LOS: 1   11/8/202210:58 AM

## 2021-08-13 NOTE — Evaluation (Signed)
Occupational Therapy Evaluation Patient Details Name: Daniel Bolton MRN: [id number] DOB: 1934-07-24 Today's Date: 08/13/2021   History of Present Illness 87 y.o. Hispanic male with medical history significant for essential hypertension, CAD status post CABG, CHF, chronic kidney disease and CVA, who presented to the emergency room with acute onset   generalized weakness since Friday with diminished appetite and oral intake.  He denies any nausea or vomiting or abdominal pain or diarrhea.  No fever or chills.  No chest pain or dyspnea or cough or wheezing.  Her daughter is not sure how much oxygen is on at home.  He uses a walker at baseline.  He had a fall on Friday sliding down to the floor slowly per his daughter.  No head injury.  No paresthesias or focal muscle weakness.  He has right upper extremity swelling. MRI results show several protruding disc in lumbar region.   Clinical Impression   Patient presenting with decreased Ind in self care, balance, functional mobility/transfers, endurance, and safety awareness.Patient reports living at home alone and uses RW for functional mobility PTA. Pt has several daughters and a son. Two children are present in the room and confirm that pt has intermittent assistance because they all work full time.Use of interpreter this session to insure correct information obtained and pt and family able to ask questions as needed. Patient currently functioning at max A for supine >sit with pain increasing to 10/10 with movement. PT needing varying levels of assist from EOB for balance. He declined functional transfer and standing tasks. OT reviewed back precautions for comfort and use of log roll for bed mobility. Patient will benefit from acute OT to increase overall independence in the areas of ADLs, functional mobility, and safety awareness in order to safely discharge to next venue of care.      Recommendations for follow up therapy are one component of a  multi-disciplinary discharge planning process, led by the attending physician.  Recommendations may be updated based on patient status, additional functional criteria and insurance authorization.   Follow Up Recommendations  Skilled nursing-short term rehab (<3 hours/day)    Assistance Recommended at Discharge Intermittent Supervision/Assistance  Functional Status Assessment  Patient has had a recent decline in their functional status and demonstrates the ability to make significant improvements in function in a reasonable and predictable amount of time.  Equipment Recommendations  Other (comment) (defer to next venue of care)       Precautions / Restrictions Precautions Precautions: Back Precaution Comments: for comfort      Mobility Bed Mobility Overal bed mobility: Needs Assistance Bed Mobility: Supine to Sit;Sit to Supine     Supine to sit: Max assist Sit to supine: Max assist   General bed mobility comments: Assist for log roll  with cuing for hand placement and technique    Transfers                   General transfer comment: Pt declined      Balance Overall balance assessment: Needs assistance Sitting-balance support: Feet supported;Bilateral upper extremity supported Sitting balance-Leahy Scale: Poor Sitting balance - Comments: min guard - max A       Standing balance comment: not attempted                           ADL either performed or assessed with clinical judgement   ADL Overall ADL's : Needs assistance/impaired  Lower Body Dressing: Maximal assistance;Bed level                 General ADL Comments: unable to transfer secondary to increased pain and pt refusal. Pt needing min guard- max A for static sitting balance on EOB. Max A to don B socks .     Vision Patient Visual Report: No change from baseline              Pertinent Vitals/Pain Pain Assessment: 0-10 Pain Score: 10-Worst pain  ever Pain Location: low back Pain Descriptors / Indicators: Aching;Discomfort;Grimacing;Guarding Pain Intervention(s): Limited activity within patient's tolerance;Monitored during session;Repositioned;Patient requesting pain meds-RN notified     Hand Dominance Right   Extremity/Trunk Assessment Upper Extremity Assessment Upper Extremity Assessment: RUE deficits/detail RUE Deficits / Details: unable to fully extend elbow. When attempting PROM pt cries out in pain RUE: Unable to fully assess due to pain   Lower Extremity Assessment Lower Extremity Assessment: Generalized weakness       Communication Communication Communication: HOH;Interpreter utilized;Prefers language other than English   Cognition Arousal/Alertness: Awake/alert Behavior During Therapy: WFL for tasks assessed/performed Overall Cognitive Status: Within Functional Limits for tasks assessed                                 General Comments: Pt is pleasant and cooperative                Home Living Family/patient expects to be discharged to:: Private residence Living Arrangements: Alone Available Help at Discharge: Family;Available PRN/intermittently (4 DTRs and 1 son) Type of Home: Mobile home Home Access: Stairs to enter Entrance Stairs-Number of Steps: 4 Entrance Stairs-Rails: Right Home Layout: One level     Bathroom Shower/Tub: Tub/shower unit         Home Equipment: Cane - single Barista (2 wheels)   Additional Comments: Pt lives alone . Son and Daughter present and report "everyone works" but they assist intermittently as needed.      Prior Functioning/Environment Prior Level of Function : Independent/Modified Independent             Mobility Comments: Pt ambulates with use of RW at baseline. ADLs Comments: Pt reports increased difficulty with LB dressing. His daughter assists with meals and family takes him to appointments as needed.        OT Problem  List: Decreased strength;Decreased activity tolerance;Impaired balance (sitting and/or standing);Decreased safety awareness;Pain;Decreased knowledge of use of DME or AE      OT Treatment/Interventions: Self-care/ADL training;Manual therapy;Therapeutic exercise;Modalities;Patient/family education;Balance training;Energy conservation;Therapeutic activities;DME and/or AE instruction    OT Goals(Current goals can be found in the care plan section) Acute Rehab OT Goals Patient Stated Goal: to decreased pain OT Goal Formulation: With patient/family Time For Goal Achievement: 08/27/21 Potential to Achieve Goals: Fair  OT Frequency: Min 2X/week   Barriers to D/C: Decreased caregiver support  Pt has family assist but they made it clear they are unable to provide 24/7 assist because they work. They are very supportive of pt but know their limitations.          AM-PAC OT "6 Clicks" Daily Activity     Outcome Measure Help from another person eating meals?: None Help from another person taking care of personal grooming?: A Little Help from another person toileting, which includes using toliet, bedpan, or urinal?: A Lot Help from another person bathing (including washing, rinsing, drying)?: A Lot Help  from another person to put on and taking off regular upper body clothing?: A Little Help from another person to put on and taking off regular lower body clothing?: A Lot 6 Click Score: 16   End of Session Nurse Communication: Mobility status;Patient requests pain meds  Activity Tolerance: Patient limited by pain Patient left: in bed;with call bell/phone within reach;with family/visitor present  OT Visit Diagnosis: Unsteadiness on feet (R26.81);Repeated falls (R29.6);Muscle weakness (generalized) (M62.81);History of falling (Z91.81)                Time: 3744-5146 OT Time Calculation (min): 30 min Charges:  OT General Charges $OT Visit: 1 Visit OT Evaluation $OT Eval Moderate Complexity: 1  Mod OT Treatments $Self Care/Home Management : 8-22 mins $Therapeutic Activity: 8-22 mins  Darleen Crocker, MS, OTR/L , CBIS ascom [phone]  08/13/21, 1:54 PM

## 2021-08-13 NOTE — TOC Initial Note (Signed)
Transition of Care [facility]) - Initial/Assessment Note    Patient Details  Name: Daniel Bolton MRN: [id number] Date of Birth: [birth year]  Transition of Care [facility]) CM/SW Contact:    Shelbie Hutching, RN Phone Number: 08/13/2021, 1:13 PM  Clinical Narrative:                 RNCM went to see patient twice this morning and both times patient had other disciplines at the bedside.  RNCM will stop by and see patient tomorrow to discuss OT recommendations, PT consult pending.  OT current recommendation for SNF, patient was not able to stand and had significant pain with sitting on the side of the bed.    Patient is from home alone where he usually walks with a walker.  Family helps with transportation.  Patient needs Spanish interpreter.    Expected Discharge Plan: Skilled Nursing Facility Barriers to Discharge: Continued Medical Work up   Patient Goals and CMS Choice   CMS Medicare.gov Compare Post Acute Care list provided to:: Patient Choice offered to / list presented to : Patient  Expected Discharge Plan and Services Expected Discharge Plan: [place]   Discharge Planning Services: CM Consult Post Acute Care Choice: [place] Living arrangements for the past 2 months: Single Family Home                 DME Arranged: N/A DME Agency: NA       HH Arranged: NA          Prior Living Arrangements/Services Living arrangements for the past 2 months: Single Family Home Lives with:: Self Patient language and need for interpreter reviewed:: Yes (Spanish) Do you feel safe going back to the place where you live?: Yes      Need for Family Participation in Patient Care: Yes (Comment) (buldging disk) Care giver support system in place?: Yes (comment) (son, daughter, granddaughter) Current home services: DME (cane and walker) Criminal Activity/Legal Involvement Pertinent to Current Situation/Hospitalization: No - Comment as needed  Activities of Daily  Living Home Assistive Devices/Equipment: Environmental consultant (specify type) ADL Screening (condition at time of admission) Patient's cognitive ability adequate to safely complete daily activities?: Yes Is the patient deaf or have difficulty hearing?: Yes Does the patient have difficulty seeing, even when wearing glasses/contacts?: No Does the patient have difficulty concentrating, remembering, or making decisions?: No Patient able to express need for assistance with ADLs?: Yes Does the patient have difficulty dressing or bathing?: Yes Independently performs ADLs?: No Does the patient have difficulty walking or climbing stairs?: Yes Weakness of Legs: Both Weakness of Arms/Hands: None  Permission Sought/Granted Permission sought to share information with : Case Manager, Family Supports Permission granted to share information with : Yes, Verbal Permission Granted  Share Information with NAME: [place] granted to share info w Relationship: granddaughter  Permission granted to share info w Contact Information: [phone]  Emotional Assessment Appearance:: Appears stated age Attitude/Demeanor/Rapport: Engaged Affect (typically observed): Accepting Orientation: : Oriented to Self, Oriented to Place, Oriented to  Time, Oriented to Situation Alcohol / Substance Use: Not Applicable Psych Involvement: No (comment)  Admission diagnosis:  Shortness of breath [R06.02] Hyperkalemia [E87.5] Positive D dimer [R79.89] Elevated troponin [R77.8] Swelling of arm [M79.89] Generalized weakness [R53.1] AKI (acute kidney injury) ([place]) [N17.9] Patient Active Problem List   Diagnosis Date Noted   Acute GI bleeding 06/14/2021   Acute on chronic combined systolic and diastolic CHF (congestive heart failure) (  [place]) 06/04/2021   Abdominal pain 06/04/2021   Constipation 06/04/2021   Left hip pain 06/04/2021   Sinus bradycardia 06/04/2021   Stroke ([place])    HLD (hyperlipidemia)     Hyperkalemia    AAA (abdominal aortic aneurysm)    Elevated troponin    Protein-calorie malnutrition, severe 04/12/2021   Essential hypertension 03/22/2021   Acute on chronic diastolic CHF (congestive heart failure) ([place]) 03/22/2021   Hyponatremia 03/22/2021   Acute on chronic systolic CHF (congestive heart failure) ([place]) 03/11/2021   Hypertensive urgency 03/11/2021   AKI (acute kidney injury) ([place]) 03/11/2021   Normochromic normocytic anemia 03/11/2021   Acute CHF (congestive heart failure) ([place]) 03/11/2021   Moderate major depression, single episode ([place]) 08/01/2015   Coronary artery disease 08/01/2015   Financial problems 08/01/2015   PCP:  [name], MD Pharmacy:   [id number] [place], [address] [address] Phone: [phone] Fax: [facility] [id number], [place] [address] Phone: [phone] Fax: [phone]     Social Determinants of Health (SDOH) Interventions    Readmission Risk Interventions Readmission Risk Prevention Plan 08/13/2021 06/18/2021  Transportation Screening Complete Complete  Medication Review Press photographer) Complete Complete  PCP or Specialist appointment within 3-5 days of discharge Complete Complete  HRI or Home Care Consult Complete Complete  SW Recovery Care/Counseling Consult Complete Complete  Palliative Care Screening Not Applicable Not [place] Complete Not Applicable  Some recent data might be hidden

## 2021-08-13 NOTE — Progress Notes (Addendum)
PROGRESS NOTE    Daniel Bolton  VPX:[id number] DOB: December 07, 1933 DOA: 08/11/2021 PCP: [name], MD   Chief Complaint  Patient presents with   Weakness    Brief Narrative: 87 y.o. Hispanic male with medical history significant for essential hypertension, CAD status post CABG, CHF, chronic kidney disease and CVA, who presented to the emergency room with acute onset   generalized weakness since Friday with diminished appetite and oral intake.  He's been found to have AKI as well as generalized weakness.  Being treated for rhabdo with IVF (caution given hx HF).  Other potential causes for his weakness being evaluated as noted below.    Assessment & Plan:   Active Problems:   Moderate major depression, single episode (HCC)   Coronary artery disease   AKI (acute kidney injury) ([place])   Normochromic normocytic anemia   Essential hypertension   Hyponatremia   Acute on chronic combined systolic and diastolic CHF (congestive heart failure) (HCC)   HLD (hyperlipidemia)   Hyperkalemia   AAA (abdominal aortic aneurysm)   Elevated troponin  Bilateral Lower Extremity Weakness  Generalized Weakness Unclear etiology, before Friday, up and walking with walker - after Friday, wasn't able to  Granddaughter noted incontinence of bowel as well as numbness in legs as well Weakness could be from neurologic causes vs rhabdo/myositis, see below Will image spine with concern for incontinence and numbness  Per granddaughter, strength has improved since being in hospital  AKI  Hyperkalemia UA with hg, but no RBC Elevated LFT's Suspect rhabdo (urine myoglobin positive) Renal US without hydro Continue IVF (caution given HF) Follow CK - rising, continuing IVF per renal Benicar and HCTZ on hold Appreciate renal assistance  Rhabdomyolysis UA Heme + and only 0-5 RBC's He's on amlodipine/simvastatin -> increases risk of rhabdo (sounds like he's been on this combination for Daniel Bolton while ->  regimen should be adjusted at discharge) - continue to hold  Also with weakness, could have been in one position for Saber Dickerman while CK rising, continuing IVF  Consider possibility of inflammatory myositis? as well, though will monitor for now with treatment for rhabdo, consider discussion with [facility] rheumatology if worsening/not improving - unclear sig of inflammatory markers at this time, will follow TSH elevated mildly, follow free T4  Lumbar Stenosis with Radiculopathy Lumbar imaging obtained with concern of LE weakness + incontinence of stool C/T spine imaging obtained with concern for UE pain, weakness as well - follow with neurology NSGY rec considering decompression when medically stable Preoperative clearance per cardiology - high risk surgical candidate (see 11/8 note)  Elevated LFT's 2/2 rhabdo  Negative acute hepatitis panel Follow RUQ [place] Trend, improving  Elevated D dimer  RUE Swelling No evidence of DVT in RUE VQ scan pending -> indeterminate LE [place] - negative for DVT Low suspicion for PE based on his overall picture - will wean O2, may need additional w/u if difficulty weaning  HFrEF EF 25-30%, global hypokinesis 04/2021, grade II diastolic dysfunction, moderately reduced RVSF  Caution with IVF given HF  Right Pleural Effusion Will need follow up imaging  CAD with hx CABG  Elevated Troponin Aspirin, plavix still being held in setting of GI bleed (was supposed to hold until reeval) No CP, appreciate cardiology assistance - suspect demand Appreciate cardiology assistance  Sinus Bradycardia Currently metoprolol on hold HR as low as 40's documented outpatient  Appreciate cardiology recs (see 11/8 note, signing off) Concern that this may contribute to weakness, appreciate cardiology assistance  Hypertension  Norvasc Holding benicar and HCTZ  HLD Holding simvastatin given elevated CK and on amlodipine, if resuming statin at discharge should resume alternative statin or  simvastatin at lower dose (after CK, LFT's improved)  GERD  Hx GI Bleed  Reflux Esophagitis  Gastric Ulcer  Erosive Gastropathy  Duodenal Ulcers PPI BID  DVT prophylaxis: lovenox Code Status: full Family Communication: Curator, son Disposition:   Status is: Inpatient  Remains inpatient appropriate because: need for further workup       Consultants:  Renal cardiology  Procedures No DVT in RUE  Antimicrobials:  Anti-infectives (From admission, onward)    None          Subjective: C/o pain with sitting up Discussion with interpreter ipad  Objective: Vitals:   08/12/21 1919 08/13/21 0010 08/13/21 0825 08/13/21 1154  BP: 136/60 (!) 129/55 (!) 137/59 (!) 139/53  Pulse: (!) 55 (!) 53 (!) 59 (!) 55  Resp: 18 16 18 18   Temp: 98.5 F (36.9 C) 98.5 F (36.9 C) (!) 97.4 F (36.3 C) (!) 97.5 F (36.4 C)  TempSrc: Oral     SpO2: 100% 100% 97% 100%  Weight:      Height:        Intake/Output Summary (Last 24 hours) at 08/13/2021 1440 Last data filed at 08/13/2021 1423 Gross per 24 hour  Intake 600 ml  Output 500 ml  Net 100 ml   Filed Weights   08/11/21 1921  Weight: 59.9 kg    Examination:  General: No acute distress. Cardiovascular: RRR Lungs: unlabored Abdomen: Soft, nontender, nondistended Neurological: Alert and oriented 3. + SLR bilaterally.  Symmetric strength to upper and lower extremities. Skin: Warm and dry. No rashes or lesions. Extremities: no sig TTP of extremities   Data Reviewed: I have personally reviewed following labs and imaging studies  CBC: Recent Labs  Lab 08/11/21 1930 08/12/21 0648 08/13/21 0526  WBC 8.7 7.7 9.4  NEUTROABS  --   --  7.9*  HGB 9.6* 9.5* 9.0*  HCT 29.7* 29.0* 27.3*  MCV 84.6 85.8 85.6  PLT 190 190 096    Basic Metabolic Panel: Recent Labs  Lab 08/11/21 1930 08/12/21 0648 08/13/21 0526  NA 130* 134* 134*  K 5.7* 5.0 5.2*  CL 97* 102 105  CO2 23 22 20*  GLUCOSE 100* 79 94  BUN 118*  124* 118*  CREATININE 3.42* 3.26* 2.76*  CALCIUM 8.8* 8.8* 8.3*  MG  --   --  2.3  PHOS  --   --  6.7*    GFR: Estimated Creatinine Clearance: 14.5 mL/min (Daniel Bolton) (by C-G formula based on SCr of 2.76 mg/dL (H)).  Liver Function Tests: Recent Labs  Lab 08/12/21 0006 08/13/21 0526  AST 1,049* 906*  ALT 494* 448*  ALKPHOS 163* 167*  BILITOT 0.9 0.7  PROT 6.7 5.7*  ALBUMIN 2.6* 2.1*    CBG: Recent Labs  Lab 08/11/21 1938  GLUCAP 95     Recent Results (from the past 240 hour(s))  Resp Panel by RT-PCR (Flu Alleah Dearman&B, Covid) Nasopharyngeal Swab     Status: None   Collection Time: 08/12/21 12:03 AM   Specimen: Nasopharyngeal Swab; Nasopharyngeal(NP) swabs in vial transport medium  Result Value Ref Range Status   SARS Coronavirus 2 by RT PCR NEGATIVE NEGATIVE Final    Comment: (NOTE) SARS-CoV-2 target nucleic acids are NOT DETECTED.  The SARS-CoV-2 RNA is generally detectable in upper respiratory specimens during the acute phase of infection. The lowest concentration of SARS-CoV-2 viral  copies this assay can detect is 138 copies/mL. Daniel Bolton negative result does not preclude SARS-Cov-2 infection and should not be used as the sole basis for treatment or other patient management decisions. Daniel Bolton negative result may occur with  improper specimen collection/handling, submission of specimen other than nasopharyngeal swab, presence of viral mutation(s) within the areas targeted by this assay, and inadequate number of viral copies(<138 copies/mL). Daniel Bolton negative result must be combined with clinical observations, patient history, and epidemiological information. The expected result is Negative.  Fact Sheet for Patients:  EntrepreneurPulse.com.au  Fact Sheet for Healthcare Providers:  IncredibleEmployment.be  This test is no t yet approved or cleared by the [place] FDA and  has been authorized for detection and/or diagnosis of SARS-CoV-2 by FDA under an  Emergency Use Authorization (EUA). This EUA will remain  in effect (meaning this test can be used) for the duration of the COVID-19 declaration under Section 564(b)(1) of the Act, 21 U.S.C.section 360bbb-3(b)(1), unless the authorization is terminated  or revoked sooner.       Influenza Daniel Bolton by PCR NEGATIVE NEGATIVE Final   Influenza B by PCR NEGATIVE NEGATIVE Final    Comment: (NOTE) The Xpert Xpress SARS-CoV-2/FLU/RSV plus assay is intended as an aid in the diagnosis of influenza from Nasopharyngeal swab specimens and should not be used as Daniel Bolton sole basis for treatment. Nasal washings and aspirates are unacceptable for Xpert Xpress SARS-CoV-2/FLU/RSV testing.  Fact Sheet for Patients: EntrepreneurPulse.com.au  Fact Sheet for Healthcare Providers: IncredibleEmployment.be  This test is not yet approved or cleared by the [place] FDA and has been authorized for detection and/or diagnosis of SARS-CoV-2 by FDA under an Emergency Use Authorization (EUA). This EUA will remain in effect (meaning this test can be used) for the duration of the COVID-19 declaration under Section 564(b)(1) of the Act, 21 U.S.C. section 360bbb-3(b)(1), unless the authorization is terminated or revoked.  Performed at [facility], [address], [place], [place] [address]          Radiology Studies: MR LUMBAR SPINE WO CONTRAST  Result Date: 08/12/2021 CLINICAL DATA:  Acute onset of generalized weakness beginning 3 days ago EXAM: MRI LUMBAR SPINE WITHOUT CONTRAST TECHNIQUE: Multiplanar, multisequence MR imaging of the lumbar spine was performed. No intravenous contrast was administered. COMPARISON:  None. FINDINGS: Segmentation:  5 lumbar type vertebral bodies assumed. Alignment: Minimal scoliotic curvature. 2 mm degenerative anterolisthesis L4-5. Vertebrae:  No fracture or focal bone lesion. Conus medullaris and cauda equina: Conus extends to the L1-2 level.  Conus and cauda equina appear normal. Paraspinal and other soft tissues: Negative Disc levels: T12-L1: Normal L1-2: Very tiny central disc protrusion with slight caudal down turning in the midline. No significant compressive effect upon the canal. Foramina widely patent. L2-3: Bulging of the disc. Mild facet and ligamentous hypertrophy. Mild stenosis of both lateral recesses, not likely compressive. L3-4: Shallow protrusion of the disc. Facet and ligamentous hypertrophy. Moderate multifactorial stenosis at this level that could cause neural compression on either or both sides. L4-5: Chronic facet arthritis with 2 mm of degenerative anterolisthesis. Large broad-based disc herniation with upward migration of disc material centrally and towards the left. Severe spinal stenosis at this level could cause neural compression on either or both sides. The upper migrated disc material on the left could focally compress the left L4 nerve. L5-S1: Endplate osteophytes and bulging of the disc. Facet and ligamentous hypertrophy. Stenosis of both subarticular lateral recesses that could compress either or both S1 nerves. IMPRESSION: At  L4-5, there is facet arthropathy with 2 mm of anterolisthesis. There is Daniel Bolton broad-based disc herniation with upward migration of extruded disc material centrally and towards the left behind L4. Severe spinal stenosis at the disc level could cause neural compression on either or both sides. The upwardly migrated disc material could focally compress the left L4 nerve. L3-4: Shallow disc protrusion. Facet and ligamentous hypertrophy. Moderate multifactorial stenosis that could cause neural compression on either or both sides. L5-S1: Bulging of the disc. Facet and ligamentous hypertrophy. Bilateral subarticular lateral recess narrowing that could possibly affect either S1 nerve. L2-3: Disc bulge. Mild facet hypertrophy. Mild narrowing of the lateral recesses without likely neural compression.  Electronically Signed   By: Nelson Chimes M.D.   On: 08/12/2021 12:49   NM Pulmonary Perfusion  Result Date: 08/12/2021 CLINICAL DATA:  PE suspected.  Positive D-dimer. EXAM: NUCLEAR MEDICINE PERFUSION LUNG SCAN TECHNIQUE: Perfusion images were obtained in multiple projections after intravenous injection of radiopharmaceutical. Ventilation scans intentionally deferred if perfusion scan and chest x-ray adequate for interpretation during COVID 19 epidemic. RADIOPHARMACEUTICALS:  4.4 mCi Tc-30m MAA IV COMPARISON:  Chest x-ray 08/12/2021. FINDINGS: Multiplanar perfusion imaging shows scattered small areas of peripheral decreased perfusion in the mid and lower lungs bilaterally. Chest x-ray from earlier today shows patchy basilar airspace disease, left greater than right with probable left effusion. IMPRESSION: Study is indeterminate for the presence of acute pulmonary embolus. Electronically Signed   By: Misty Stanley M.D.   On: 08/12/2021 13:28   US RENAL  Result Date: 08/12/2021 CLINICAL DATA:  Acute renal insufficiency. EXAM: RENAL / URINARY TRACT ULTRASOUND COMPLETE COMPARISON:  None. FINDINGS: Right Kidney: Renal measurements: 10.1 x 4.0 x 4.6 cm = volume: 96 mL. 6 there is diffuse increased renal parenchymal echogenicity. No hydronephrosis or shadowing stone. Left Kidney: Renal measurements: 9.1 x 4.8 x 3.3 cm = volume: 75 mL. Diffuse increased renal parenchymal echogenicity. No hydronephrosis or shadowing stone Bladder: Appears normal for degree of bladder distention. Other: The prostate gland measures 4.7 x 3.8 x 4.8 cm. Partially visualized right pleural effusion. There is coarsened appearance of the liver parenchyma. Daniel Bolton trace perihepatic free fluid may be present. IMPRESSION: 1. Echogenic kidneys, likely related to medical renal disease. No hydronephrosis or shadowing stone. 2. Partially visualized right pleural effusion. Electronically Signed   By: Daniel Bolton M.D.   On: 08/12/2021 03:44   US  Venous Img Lower Bilateral (DVT)  Result Date: 08/12/2021 CLINICAL DATA:  Positive D-dimer concern for DVT EXAM: BILATERAL LOWER EXTREMITY VENOUS DOPPLER ULTRASOUND TECHNIQUE: Gray-scale sonography with graded compression, as well as color Doppler and duplex ultrasound were performed to evaluate the lower extremity deep venous systems from the level of the common femoral vein and including the common femoral, femoral, profunda femoral, popliteal and calf veins including the posterior tibial, peroneal and gastrocnemius veins when visible. The superficial great saphenous vein was also interrogated. Spectral Doppler was utilized to evaluate flow at rest and with distal augmentation maneuvers in the common femoral, femoral and popliteal veins. COMPARISON:  None. FINDINGS: RIGHT LOWER EXTREMITY Common Femoral Vein: No evidence of thrombus. Normal compressibility, respiratory phasicity and response to augmentation. Saphenofemoral Junction: No evidence of thrombus. Normal compressibility and flow on color Doppler imaging. Profunda Femoral Vein: No evidence of thrombus. Normal compressibility and flow on color Doppler imaging. Femoral Vein: No evidence of thrombus. Normal compressibility, respiratory phasicity and response to augmentation. Popliteal Vein: No evidence of thrombus. Normal compressibility, respiratory phasicity and response to augmentation.  Calf Veins: No evidence of thrombus. Normal compressibility and flow on color Doppler imaging. Superficial Great Saphenous Vein: No evidence of thrombus. Normal compressibility. Venous Reflux:  None. Other Findings:  None. LEFT LOWER EXTREMITY Common Femoral Vein: No evidence of thrombus. Normal compressibility, respiratory phasicity and response to augmentation. Saphenofemoral Junction: No evidence of thrombus. Normal compressibility and flow on color Doppler imaging. Profunda Femoral Vein: No evidence of thrombus. Normal compressibility and flow on color Doppler  imaging. Femoral Vein: No evidence of thrombus. Normal compressibility, respiratory phasicity and response to augmentation. Popliteal Vein: No evidence of thrombus. Normal compressibility, respiratory phasicity and response to augmentation. Calf Veins: No evidence of thrombus. Normal compressibility and flow on color Doppler imaging. Superficial Great Saphenous Vein: No evidence of thrombus. Normal compressibility. Venous Reflux:  None. Other Findings:  None. IMPRESSION: No evidence of deep venous thrombosis in either lower extremity. Electronically Signed   By: Dahlia Bailiff M.D.   On: 08/12/2021 19:18   US Venous Img Upper Uni Right(DVT)  Result Date: 08/12/2021 CLINICAL DATA:  Right upper extremity swelling. EXAM: Right UPPER EXTREMITY VENOUS DOPPLER ULTRASOUND TECHNIQUE: Gray-scale sonography with graded compression, as well as color Doppler and duplex ultrasound were performed to evaluate the upper extremity deep venous system from the level of the subclavian vein and including the jugular, axillary, basilic, radial, ulnar and upper cephalic vein. Spectral Doppler was utilized to evaluate flow at rest and with distal augmentation maneuvers. COMPARISON:  None. FINDINGS: Contralateral Subclavian Vein: Respiratory phasicity is normal and symmetric with the symptomatic side. No evidence of thrombus. Normal compressibility. Internal Jugular Vein: No evidence of thrombus. Normal compressibility, respiratory phasicity and response to augmentation. Subclavian Vein: No evidence of thrombus. Normal compressibility, respiratory phasicity and response to augmentation. Axillary Vein: No evidence of thrombus. Normal compressibility, respiratory phasicity and response to augmentation. Cephalic Vein: No evidence of thrombus. Normal compressibility, respiratory phasicity and response to augmentation. Basilic Vein: No evidence of thrombus. Normal compressibility, respiratory phasicity and response to augmentation. Brachial  Veins: No evidence of thrombus. Normal compressibility, respiratory phasicity and response to augmentation. Radial Veins: No evidence of thrombus. Normal compressibility, respiratory phasicity and response to augmentation. Ulnar Veins: No evidence of thrombus. Normal compressibility, respiratory phasicity and response to augmentation. Venous Reflux:  None visualized. Other Findings: There is diffuse subcutaneous edema of the right upper extremity. IMPRESSION: No evidence of DVT within the right upper extremity. Electronically Signed   By: Daniel Bolton M.D.   On: 08/12/2021 01:00   DG Chest Port 1 View  Result Date: 08/12/2021 CLINICAL DATA:  Shortness of breath EXAM: PORTABLE CHEST 1 VIEW COMPARISON:  Chest x-ray 06/14/2021 FINDINGS: Heart is enlarged. Mediastinum appears stable. Calcified plaques in the aortic arch. Pulmonary vascular congestion. Increased opacities at the left lung base may represent Tamala Manzer small effusion. No pneumothorax visualized. IMPRESSION: Cardiomegaly, pulmonary vascular congestion and possible small left pleural effusion. Electronically Signed   By: Ofilia Neas M.D.   On: 08/12/2021 08:00        Scheduled Meds:  amLODipine  10 mg Oral Daily   enoxaparin (LOVENOX) injection  30 mg Subcutaneous Q24H   feeding supplement  237 mL Oral BID BM   pantoprazole  40 mg Oral BID   Continuous Infusions:  sodium chloride 75 mL/hr at 08/13/21 0931     LOS: 1 day    Time spent: over 30 min    Fayrene Helper, MD [facility]   To contact the attending provider between 7A-7P or the covering  provider during after hours 7P-7A, please log into the web site [email or web address] and access using universal [place] password for that web site. If you do not have the password, please call the hospital operator.  08/13/2021, 2:40 PM

## 2021-08-14 ENCOUNTER — Inpatient Hospital Stay: Payer: Medicare HMO

## 2021-08-14 DIAGNOSIS — K8020 Calculus of gallbladder without cholecystitis without obstruction: Secondary | ICD-10-CM | POA: Diagnosis not present

## 2021-08-14 DIAGNOSIS — E871 Hypo-osmolality and hyponatremia: Secondary | ICD-10-CM

## 2021-08-14 DIAGNOSIS — I2510 Atherosclerotic heart disease of native coronary artery without angina pectoris: Secondary | ICD-10-CM | POA: Diagnosis not present

## 2021-08-14 DIAGNOSIS — N179 Acute kidney failure, unspecified: Secondary | ICD-10-CM | POA: Diagnosis not present

## 2021-08-14 DIAGNOSIS — E785 Hyperlipidemia, unspecified: Secondary | ICD-10-CM

## 2021-08-14 DIAGNOSIS — I10 Essential (primary) hypertension: Secondary | ICD-10-CM | POA: Diagnosis not present

## 2021-08-14 LAB — COMPREHENSIVE METABOLIC PANEL
ALT: 513 U/L — ABNORMAL HIGH (ref 0–44)
AST: 994 U/L — ABNORMAL HIGH (ref 15–41)
Albumin: 2.4 g/dL — ABNORMAL LOW (ref 3.5–5.0)
Alkaline Phosphatase: 183 U/L — ABNORMAL HIGH (ref 38–126)
Anion gap: 10 (ref 5–15)
BUN: 118 mg/dL — ABNORMAL HIGH (ref 8–23)
CO2: 18 mmol/L — ABNORMAL LOW (ref 22–32)
Calcium: 8.3 mg/dL — ABNORMAL LOW (ref 8.9–10.3)
Chloride: 103 mmol/L (ref 98–111)
Creatinine, Ser: 3.01 mg/dL — ABNORMAL HIGH (ref 0.61–1.24)
GFR, Estimated: 20 mL/min — ABNORMAL LOW (ref >60)
Glucose, Bld: 113 mg/dL — ABNORMAL HIGH (ref 70–99)
Potassium: 6 mmol/L — ABNORMAL HIGH (ref 3.5–5.1)
Sodium: 131 mmol/L — ABNORMAL LOW (ref 135–145)
Total Bilirubin: 0.7 mg/dL (ref 0.3–1.2)
Total Protein: 6.3 g/dL — ABNORMAL LOW (ref 6.5–8.1)

## 2021-08-14 LAB — CBC WITH DIFFERENTIAL/PLATELET
Abs Immature Granulocytes: 0.04 10*3/uL (ref 0.00–0.07)
Basophils Absolute: 0 10*3/uL (ref 0.0–0.1)
Basophils Relative: 0 %
Eosinophils Absolute: 0.1 10*3/uL (ref 0.0–0.5)
Eosinophils Relative: 1 %
HCT: 28.4 % — ABNORMAL LOW (ref 39.0–52.0)
Hemoglobin: 9.3 g/dL — ABNORMAL LOW (ref 13.0–17.0)
Immature Granulocytes: 0 %
Lymphocytes Relative: 10 %
Lymphs Abs: 1.1 10*3/uL (ref 0.7–4.0)
MCH: 27.7 pg (ref 26.0–34.0)
MCHC: 32.7 g/dL (ref 30.0–36.0)
MCV: 84.5 fL (ref 80.0–100.0)
Monocytes Absolute: 0.6 10*3/uL (ref 0.1–1.0)
Monocytes Relative: 6 %
Neutro Abs: 9.6 10*3/uL — ABNORMAL HIGH (ref 1.7–7.7)
Neutrophils Relative %: 83 %
Platelets: 223 10*3/uL (ref 150–400)
RBC: 3.36 MIL/uL — ABNORMAL LOW (ref 4.22–5.81)
RDW: 16 % — ABNORMAL HIGH (ref 11.5–15.5)
WBC: 11.5 10*3/uL — ABNORMAL HIGH (ref 4.0–10.5)
nRBC: 0 % (ref 0.0–0.2)

## 2021-08-14 LAB — C-REACTIVE PROTEIN: CRP: 10 mg/dL — ABNORMAL HIGH (ref <1.0)

## 2021-08-14 LAB — PHOSPHORUS: Phosphorus: 7.5 mg/dL — ABNORMAL HIGH (ref 2.5–4.6)

## 2021-08-14 LAB — MAGNESIUM: Magnesium: 2.4 mg/dL (ref 1.7–2.4)

## 2021-08-14 LAB — SEDIMENTATION RATE: Sed Rate: 72 mm/hr — ABNORMAL HIGH (ref 0–20)

## 2021-08-14 MED ORDER — SODIUM POLYSTYRENE SULFONATE 15 GM/60ML PO SUSP
30.0000 g | Freq: Once | ORAL | Status: AC
  Administered 2021-08-14: 10:00:00 30 g via ORAL
  Filled 2021-08-14: qty 120

## 2021-08-14 MED ORDER — SODIUM ZIRCONIUM CYCLOSILICATE 10 G PO PACK
10.0000 g | PACK | Freq: Once | ORAL | Status: DC
  Filled 2021-08-14: qty 1

## 2021-08-14 MED ORDER — DEXTROSE 50 % IV SOLN
25.0000 mL | Freq: Once | INTRAVENOUS | Status: AC
  Administered 2021-08-14: 14:00:00 25 mL via INTRAVENOUS
  Filled 2021-08-14: qty 50

## 2021-08-14 MED ORDER — PIPERACILLIN-TAZOBACTAM IN DEX 2-0.25 GM/50ML IV SOLN
2.2500 g | Freq: Four times a day (QID) | INTRAVENOUS | Status: DC
  Administered 2021-08-14 – 2021-08-19 (×18): 2.25 g via INTRAVENOUS
  Filled 2021-08-14 (×26): qty 50

## 2021-08-14 MED ORDER — INSULIN ASPART 100 UNIT/ML IV SOLN
10.0000 [IU] | Freq: Once | INTRAVENOUS | Status: AC
  Administered 2021-08-14: 10 [IU] via INTRAVENOUS
  Filled 2021-08-14: qty 0.1

## 2021-08-14 NOTE — Plan of Care (Signed)
Translation (spanish) provided by [facility] this shift . Patient needs were communicated to this nurse and addressed.

## 2021-08-14 NOTE — Consult Note (Signed)
GI Inpatient Consult Note  Reason for Consult: Transaminitis with cholelithiasis and possible cholecystitis    Attending Requesting Consult: Dr. Nolberto Hanlon  History of Present Illness: Daniel Bolton is a 87 y.o. male seen for evaluation of severe transaminitis with cholelithiasis and possible cholecystitis at the request of Dr. Nolberto Hanlon. Pt has a PMH of HTN, HLD, Hx of CVA, CKD Stage IIIb, COPD, CAD, Hx of CABG x3 in 2003, Hx of recent GI bleed 2/2 peptic ulcer disease treated with bipolar cautery 06/2021 by Dr. Virgina Jock. Pt presented to the [facility] ED 11/6 for generalized weakness, falls at home, and reduced oral intake. Patient is non-English speaking and video interpretor is used to help with exam. Patient lives at home with daughter and son-in-law. Patient's daughter, Daniel Bolton, provides most of the history. Upon presentation to the ED, labs were significant for acute on chronic kidney injury with serum creatinine 3.42 (baseline Cr 1.3 - 1.7), BUN 118, hemoglobin 9.6, hematocrit 29.7, hs-troponin 111, sodium 130, potassium 5.7, creatinine kinase 2617, ESR 93. He was also found to have significant transaminitis with AST 1049, ALT 494. His alk phos was 163 and tbili within normal limits at 0.9-0.7. Urinalysis was significant for proteinuria and positive myoglobin. He began treatment with IVF hydration for possible rhabdomyolysis. 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 He had RUQ US performed this morning showing large gallstone in neck of gallbladder with possible wall changes suggestive of cholecystitis and dilated common bile duct at 9.0 mm. General Surgery has been consulted. Daughter reports that her father has been complaining of abdominal pain over the past several days, initially starting in the LUQ and then moving towards his right upper quadrant. She denies any nausea, vomiting, or fevers. She reports is appetite at home has been severely reduced.     Past  Medical History:  Past Medical History:  Diagnosis Date   CHF (congestive heart failure) ([place])    Chronic kidney disease    Hypertension    Hyponatremia    Stroke [facility])     Problem List: Patient Active Problem List   Diagnosis Date Noted   Acute GI bleeding 06/14/2021   Acute on chronic combined systolic and diastolic CHF (congestive heart failure) ([place]) 06/04/2021   Abdominal pain 06/04/2021   Constipation 06/04/2021   Left hip pain 06/04/2021   Sinus bradycardia 06/04/2021   Stroke ([place])    HLD (hyperlipidemia)    Hyperkalemia    AAA (abdominal aortic aneurysm)    Elevated troponin    Protein-calorie malnutrition, severe 04/12/2021   Essential hypertension 03/22/2021   Acute on chronic diastolic CHF (congestive heart failure) ([place]) 03/22/2021   Hyponatremia 03/22/2021   Acute on chronic systolic CHF (congestive heart failure) ([place]) 03/11/2021   Hypertensive urgency 03/11/2021   AKI (acute kidney injury) ([place]) 03/11/2021   Normochromic normocytic anemia 03/11/2021   Acute CHF (congestive heart failure) ([place]) 03/11/2021   Moderate major depression, single episode ([place]) 08/01/2015   Coronary artery disease 08/01/2015   Financial problems 08/01/2015    Past Surgical History: Past Surgical History:  Procedure Laterality Date   BRAIN SURGERY     CARDIAC SURGERY     ESOPHAGOGASTRODUODENOSCOPY N/A 06/15/2021   Procedure: ESOPHAGOGASTRODUODENOSCOPY (EGD);  Surgeon: Annamaria Helling, DO;  Location: [facility] ENDOSCOPY;  Service: Gastroenterology;  Laterality: N/A;    Allergies: Allergies  Allergen Reactions   Hydralazine     Pt has severe dizziness with it, and does not want to take  it.    Home Medications: Medications Prior to Admission  Medication Sig Dispense Refill Last Dose   amLODipine (NORVASC) 10 MG tablet Take 1 tablet (10 mg total) by mouth daily. 30 tablet 0 08/11/2021   feeding supplement (ENSURE ENLIVE / ENSURE PLUS) LIQD Take 237 mLs by mouth 2 (two) times  daily between meals. 237 mL 12 08/11/2021   furosemide (LASIX) 40 MG tablet Take 1 tablet (40 mg total) by mouth daily. 90 tablet 0 08/11/2021   metoprolol succinate (TOPROL-XL) 50 MG 24 hr tablet Take 0.5 tablets (25 mg total) by mouth daily. Take with or immediately following a meal.   08/11/2021   olmesartan (BENICAR) 20 MG tablet Take 20 mg by mouth daily.   08/11/2021   pantoprazole (PROTONIX) 40 MG tablet Take 1 tablet (40 mg total) by mouth 2 (two) times daily. 60 tablet 0 08/11/2021   simvastatin (ZOCOR) 80 MG tablet Take 80 mg by mouth daily.   Past Week   Home medication reconciliation was completed with the patient.   Scheduled Inpatient Medications:    enoxaparin (LOVENOX) injection  30 mg Subcutaneous Q24H   feeding supplement  237 mL Oral BID BM   pantoprazole  40 mg Oral BID    Continuous Inpatient Infusions:    sodium chloride 75 mL/hr at 08/14/21 1442    PRN Inpatient Medications:  acetaminophen **OR** acetaminophen, magnesium hydroxide, morphine injection, ondansetron **OR** ondansetron (ZOFRAN) IV, oxyCODONE, traZODone  Family History: family history includes Cancer in his sister; Heart disease in his sister.  The patient's family history is negative for inflammatory bowel disorders, GI malignancy, or solid organ transplantation.  Social History:   reports that he has never smoked. He has never used smokeless tobacco. He reports that he does not drink alcohol and does not use drugs. The patient denies ETOH, tobacco, or drug use.   Review of Systems:  Unable to obtain due to patient's physical condition    Physical Examination: BP (!) 140/51 (BP Location: Left Arm)   Pulse 61   Temp 97.8 F (36.6 C) (Oral)   Resp 20   Ht 5' 1"  (1.549 m)   Wt 59.9 kg   SpO2 94%   BMI 24.94 kg/m  Chronically ill-appearing male in no acute distress. Lethargic, but arouseable. Daughter in room provides history. Video interpretor used.  Gen: NAD, alert and oriented x 4 HEENT:  PEERLA, EOMI, sclera anicteric  Neck: supple, no JVD or thyromegaly Chest: CTA bilaterally, no wheezes, crackles, or other adventitious sounds CV: RRR, no m/g/c/r Abd: soft, ND, +BS in all four quadrants; tender to deep palpation in RUQ no HSM, guarding, ridigity, or rebound tenderness Ext: 1+ edema, well perfused with 2+ pulses, Skin: no rash or lesions noted Lymph: no LAD  Data: Lab Results  Component Value Date   WBC 11.5 (H) 08/14/2021   HGB 9.3 (L) 08/14/2021   HCT 28.4 (L) 08/14/2021   MCV 84.5 08/14/2021   PLT 223 08/14/2021   Recent Labs  Lab 08/12/21 0648 08/13/21 0526 08/14/21 0553  HGB 9.5* 9.0* 9.3*   Lab Results  Component Value Date   NA 131 (L) 08/14/2021   K 6.0 (H) 08/14/2021   CL 103 08/14/2021   CO2 18 (L) 08/14/2021   BUN 118 (H) 08/14/2021   CREATININE 3.01 (H) 08/14/2021   Lab Results  Component Value Date   ALT 513 (H) 08/14/2021   AST 994 (H) 08/14/2021   ALKPHOS 183 (H) 08/14/2021   BILITOT 0.7  08/14/2021   No results for input(s): APTT, INR, PTT in the last 168 hours.  EGD 06/15/2021: Impression:             - LA Grade C reflux esophagitis with no bleeding. - Non-bleeding gastric ulcers with no stigmata of bleeding. Biopsied. - Erosive gastropathy with no bleeding and no stigmata of recent bleeding. - Non-bleeding duodenal ulcers with a visible vessel. Injected. Treated with bipolar cautery.  RUQ [place] 08/14/21: IMPRESSION: 1. Cholelithiasis and findings suspicious for acute cholecystitis. 2. Dilated common bile duct could suggest an obstructing common bile duct stone. MRI abdomen/MRCP may be helpful for further evaluation. 3. Right pleural effusion.  Assessment/Plan:  87 y/o Hispanic male with a PMH of HTN, Hx of CVA, CKD Stage IIIb, COPD, CAD, Hx of CABG x3 in 2003, Hx of recent GI bleed 2/2 peptic ulcer disease treated with bipolar cautery 06/2021 by Dr. Virgina Jock admitted to the hospital for generalized weakness, falls at home, and  severely reduced appetite and oral intake. He was found to have acute on chronic kidney disease with likely rhabdomyolysis and incidentally found to have severe transaminitis with cholelithiasis and possible cholecystitis  Transaminitis - possibly 2/2 rhabdomyolysis given his severely elevated creatinine kinase >1500, UA heme +, and weakness. Negative acute hepatitis panel.   Cholelithiasis with possible cholecystitis - RUQ [place] today showing large gallstone near gallbladder neck with some wall changes and minimally elevated CBD to 9.0 mm. Concern for possible cholecystitis given these findings. Choledocholithiasis deemed less likely given only minimally elevated alk phos and normal total bilirubin. General Surgery saw patient and recommended HIDA scan to definitely rule out cholecystitis.   Rhabdomyolysis - appreciate care per primary team  Acute on chronic kidney disease - Nephrology following   Lumbar stenosis with radiculopathy - Neurosurgery following  Chronic combined systolic and diastolic CHF - Cardiology following   Generalized weakness/Bilateral lower extremity weakness  Recommendations:  - Appreciate General Surgery recommendations. I discussed plan of care with Loel Dubonnet, PA-C, and HIDA is planned to rule out cholecystitis and if positive, will need percutaneous cholecystostomy tube placement. - If HIDA is negative for cholecystitis, would recommend MRCP to rule out extrahepatic biliary dilatation. If there is evidence of choledocholithiasis on imaging, will need to have ERCP with Dr. Allen Norris.  - Start IV Zosyn to cover for cholecystitis  - Continue serial abdominal examinations  - Continue to monitor daily LFTs - Continue pain control and anti-emetics  - Continue supportive care per primary team - Following. Further recs after HIDA scan.   Thank you for the consult. Please call with questions or concerns.  Reeves Forth [facility]  Gastroenterology [phone] (Cell)

## 2021-08-14 NOTE — Progress Notes (Addendum)
[facility]  ROUNDING NOTE   Subjective:   Daniel Bolton is a 87 year old Hispanic male with a past medical history including CAD with CABG, diastolic CHF, hypertension, CVA, and chronic kidney disease stage IIIa.  Patient presents to the emergency room with complaints of generalized weakness and increased swelling.  Patient will be admitted for Shortness of breath [R06.02] Hyperkalemia [E87.5] Positive D dimer [R79.89] Elevated troponin [R77.8] Swelling of arm [M79.89] Generalized weakness [R53.1] AKI (acute kidney injury) ([address]) [N17.9]  Patient is known to our office via previous hospitalizations and is followed in our office by Dr. Lanora Manis.    Patient resting comfortably in bed, son remains at bedside Spoke with patient via video [place] interpreter Tolerating small meals, denies nausea and vomiting Denies shortness of breath, O2 weaned to 1 L Creatinine increased from yesterday, potassium at 6 Recorded urine output 900 mL in preceding 24 hours  Objective:  Vital signs in last 24 hours:  Temp:  [97.3 F (36.3 C)-98.6 F (37 C)] 98 F (36.7 C) (11/09 0812) Pulse Rate:  [53-66] 64 (11/09 0812) Resp:  [16-20] 16 (11/09 0812) BP: (102-132)/(40-59) 123/51 (11/09 0812) SpO2:  [89 %-98 %] 94 % (11/09 1000)  Weight change:  Filed Weights   08/11/21 1921  Weight: 59.9 kg    Intake/Output: I/O last 3 completed shifts: In: 3602.8 [P.O.:800; I.V.:2802.8] Out: 900 [Urine:900]   Intake/Output this shift:  No intake/output data recorded.  Physical Exam: General: NAD, resting comfortably  Head: Normocephalic, atraumatic. Moist oral mucosal membranes  Eyes: Anicteric  Lungs:  Clear to auscultation, normal effort, 1 L O2 [place]  Heart: Regular rate and rhythm  Abdomen:  Soft, tender, nondistended  Extremities: 1+ peripheral edema.  Neurologic: Nonfocal, moving all four extremities  Skin: No lesions       Basic Metabolic Panel: Recent Labs  Lab  08/11/21 1930 08/12/21 0648 08/13/21 0526 08/14/21 0553  NA 130* 134* 134* 131*  K 5.7* 5.0 5.2* 6.0*  CL 97* 102 105 103  CO2 23 22 20* 18*  GLUCOSE 100* 79 94 113*  BUN 118* 124* 118* 118*  CREATININE 3.42* 3.26* 2.76* 3.01*  CALCIUM 8.8* 8.8* 8.3* 8.3*  MG  --   --  2.3 2.4  PHOS  --   --  6.7* 7.5*     Liver Function Tests: Recent Labs  Lab 08/12/21 0006 08/13/21 0526 08/14/21 0553  AST 1,049* 906* 994*  ALT 494* 448* 513*  ALKPHOS 163* 167* 183*  BILITOT 0.9 0.7 0.7  PROT 6.7 5.7* 6.3*  ALBUMIN 2.6* 2.1* 2.4*    No results for input(s): LIPASE, AMYLASE in the last 168 hours. No results for input(s): AMMONIA in the last 168 hours.  CBC: Recent Labs  Lab 08/11/21 1930 08/12/21 0648 08/13/21 0526 08/14/21 0553  WBC 8.7 7.7 9.4 11.5*  NEUTROABS  --   --  7.9* 9.6*  HGB 9.6* 9.5* 9.0* 9.3*  HCT 29.7* 29.0* 27.3* 28.4*  MCV 84.6 85.8 85.6 84.5  PLT 190 190 196 223     Cardiac Enzymes: Recent Labs  Lab 08/12/21 0826 08/13/21 0526  CKTOTAL 2,617* 3,846*     BNP: Invalid input(s): POCBNP  CBG: Recent Labs  Lab 08/11/21 [place] 95     Microbiology: Results for orders placed or performed during the hospital encounter of 08/11/21  Resp Panel by RT-PCR (Flu A&B, Covid) Nasopharyngeal Swab     Status: None   Collection Time: 08/12/21 12:03 AM   Specimen:  Nasopharyngeal Swab; Nasopharyngeal(NP) swabs in vial transport medium  Result Value Ref Range Status   SARS Coronavirus 2 by RT PCR NEGATIVE NEGATIVE Final    Comment: (NOTE) SARS-CoV-2 target nucleic acids are NOT DETECTED.  The SARS-CoV-2 RNA is generally detectable in upper respiratory specimens during the acute phase of infection. The lowest concentration of SARS-CoV-2 viral copies this assay can detect is 138 copies/mL. A negative result does not preclude SARS-Cov-2 infection and should not be used as the sole basis for treatment or other patient management decisions. A negative  result may occur with  improper specimen collection/handling, submission of specimen other than nasopharyngeal swab, presence of viral mutation(s) within the areas targeted by this assay, and inadequate number of viral copies(<138 copies/mL). A negative result must be combined with clinical observations, patient history, and epidemiological information. The expected result is Negative.  Fact Sheet for Patients:  EntrepreneurPulse.com.au  Fact Sheet for Healthcare Providers:  IncredibleEmployment.be  This test is no t yet approved or cleared by the [place] FDA and  has been authorized for detection and/or diagnosis of SARS-CoV-2 by FDA under an Emergency Use Authorization (EUA). This EUA will remain  in effect (meaning this test can be used) for the duration of the COVID-19 declaration under Section 564(b)(1) of the Act, 21 U.S.C.section 360bbb-3(b)(1), unless the authorization is terminated  or revoked sooner.       Influenza A by PCR NEGATIVE NEGATIVE Final   Influenza B by PCR NEGATIVE NEGATIVE Final    Comment: (NOTE) The Xpert Xpress SARS-CoV-2/FLU/RSV plus assay is intended as an aid in the diagnosis of influenza from Nasopharyngeal swab specimens and should not be used as a sole basis for treatment. Nasal washings and aspirates are unacceptable for Xpert Xpress SARS-CoV-2/FLU/RSV testing.  Fact Sheet for Patients: EntrepreneurPulse.com.au  Fact Sheet for Healthcare Providers: IncredibleEmployment.be  This test is not yet approved or cleared by the [place] FDA and has been authorized for detection and/or diagnosis of SARS-CoV-2 by FDA under an Emergency Use Authorization (EUA). This EUA will remain in effect (meaning this test can be used) for the duration of the COVID-19 declaration under Section 564(b)(1) of the Act, 21 U.S.C. section 360bbb-3(b)(1), unless the authorization is  terminated or revoked.  Performed at [facility], [address] [place], [place] [address]     Coagulation Studies: No results for input(s): LABPROT, INR in the last 72 hours.  Urinalysis: Recent Labs    08/11/21 1930  COLORURINE YELLOW*  LABSPEC 1.011  PHURINE 6.0  GLUCOSEU NEGATIVE  HGBUR LARGE*  BILIRUBINUR NEGATIVE  KETONESUR NEGATIVE  PROTEINUR 100*  NITRITE NEGATIVE  LEUKOCYTESUR NEGATIVE       Imaging: MR CERVICAL SPINE WO CONTRAST  Addendum Date: 08/13/2021   ADDENDUM REPORT: 08/13/2021 15:23 IMPRESSION: 6.  Moderate right and trace left pleural effusions. Electronically Signed   By: Merilyn Baba M.D.   On: 08/13/2021 15:23   Result Date: 08/13/2021 CLINICAL DATA:  Upper extremity weakness, incontinence, leg numbness EXAM: MRI CERVICAL AND THORACIC SPINE WITHOUT CONTRAST TECHNIQUE: Multiplanar and multiecho pulse sequences of the cervical spine, to include the craniocervical junction and cervicothoracic junction, and the thoracic spine, were obtained without intravenous contrast. COMPARISON:  No prior MRI, correlation is made with CT cervical spine 06/14/2021 FINDINGS: MRI CERVICAL SPINE FINDINGS Alignment: Unchanged trace anterolisthesis C4 on C5 and C5 on C6. Vertebrae: No acute fracture or suspicious osseous lesion. Congenitally short pedicles, which narrow the AP diameter of the spinal canal. Cord: Normal  signal and morphology. Posterior Fossa, vertebral arteries, paraspinal tissues: Negative. Disc levels: C2-C3: No significant disc bulge. Facet arthropathy and right-greater-than-left uncovertebral hypertrophy. No spinal canal stenosis. Mild right neural foraminal narrowing. C3-C4: Central disc extrusion with 4 mm of caudal migration, which indents the ventral spinal cord. Facet arthropathy. Mild to moderate bilateral neural foraminal narrowing. Moderate spinal canal stenosis. C4-C5: Disc bulge with superimposed central protrusion, which indents the ventral  cord. Ligamentum flavum hypertrophy. Left greater than right facet and uncovertebral hypertrophy. Moderate to severe spinal canal stenosis. Moderate bilateral neural foraminal narrowing. C5-C6: Mild anterolisthesis and disc unroofing. Facet arthropathy. Mild bilateral neural foraminal narrowing. No spinal canal stenosis. C6-C7: Minimal disc bulge. Left-greater-than-right uncovertebral hypertrophy. Facet arthropathy. No spinal canal stenosis. Moderate bilateral neural foraminal narrowing. C7-T1: No significant disc bulge. Facet arthropathy. No spinal canal stenosis. Mild bilateral neural foraminal narrowing. MRI THORACIC SPINE FINDINGS Alignment:  No listhesis. Vertebrae: No acute fracture or suspicious osseous lesion. Cord:  Normal in signal and morphology. Paraspinal and other soft tissues: Moderate right and trace left pleural effusion. Disc levels: Mild degenerative changes without significant spinal canal stenosis or high-grade neural foraminal narrowing. IMPRESSION: 1. No evidence of cord signal abnormality in the cervical or thoracic spine. 2. C4-C5 moderate to severe spinal canal stenosis and moderate bilateral neural foraminal narrowing. 3. C3-C4 moderate spinal canal stenosis and mild-to-moderate bilateral neural foraminal narrowing. 4. C6-C7 moderate bilateral neural foraminal narrowing. 5. No significant spinal canal stenosis or neural foraminal narrowing in the thoracic spine. Electronically Signed: By: Merilyn Baba M.D. On: 08/13/2021 15:19   MR THORACIC SPINE WO CONTRAST  Addendum Date: 08/13/2021   ADDENDUM REPORT: 08/13/2021 15:23 IMPRESSION: 6.  Moderate right and trace left pleural effusions. Electronically Signed   By: Merilyn Baba M.D.   On: 08/13/2021 15:23   Result Date: 08/13/2021 CLINICAL DATA:  Upper extremity weakness, incontinence, leg numbness EXAM: MRI CERVICAL AND THORACIC SPINE WITHOUT CONTRAST TECHNIQUE: Multiplanar and multiecho pulse sequences of the cervical spine, to  include the craniocervical junction and cervicothoracic junction, and the thoracic spine, were obtained without intravenous contrast. COMPARISON:  No prior MRI, correlation is made with CT cervical spine 06/14/2021 FINDINGS: MRI CERVICAL SPINE FINDINGS Alignment: Unchanged trace anterolisthesis C4 on C5 and C5 on C6. Vertebrae: No acute fracture or suspicious osseous lesion. Congenitally short pedicles, which narrow the AP diameter of the spinal canal. Cord: Normal signal and morphology. Posterior Fossa, vertebral arteries, paraspinal tissues: Negative. Disc levels: C2-C3: No significant disc bulge. Facet arthropathy and right-greater-than-left uncovertebral hypertrophy. No spinal canal stenosis. Mild right neural foraminal narrowing. C3-C4: Central disc extrusion with 4 mm of caudal migration, which indents the ventral spinal cord. Facet arthropathy. Mild to moderate bilateral neural foraminal narrowing. Moderate spinal canal stenosis. C4-C5: Disc bulge with superimposed central protrusion, which indents the ventral cord. Ligamentum flavum hypertrophy. Left greater than right facet and uncovertebral hypertrophy. Moderate to severe spinal canal stenosis. Moderate bilateral neural foraminal narrowing. C5-C6: Mild anterolisthesis and disc unroofing. Facet arthropathy. Mild bilateral neural foraminal narrowing. No spinal canal stenosis. C6-C7: Minimal disc bulge. Left-greater-than-right uncovertebral hypertrophy. Facet arthropathy. No spinal canal stenosis. Moderate bilateral neural foraminal narrowing. C7-T1: No significant disc bulge. Facet arthropathy. No spinal canal stenosis. Mild bilateral neural foraminal narrowing. MRI THORACIC SPINE FINDINGS Alignment:  No listhesis. Vertebrae: No acute fracture or suspicious osseous lesion. Cord:  Normal in signal and morphology. Paraspinal and other soft tissues: Moderate right and trace left pleural effusion. Disc levels: Mild degenerative changes without significant spinal  canal stenosis or high-grade neural foraminal narrowing. IMPRESSION: 1. No evidence of cord signal abnormality in the cervical or thoracic spine. 2. C4-C5 moderate to severe spinal canal stenosis and moderate bilateral neural foraminal narrowing. 3. C3-C4 moderate spinal canal stenosis and mild-to-moderate bilateral neural foraminal narrowing. 4. C6-C7 moderate bilateral neural foraminal narrowing. 5. No significant spinal canal stenosis or neural foraminal narrowing in the thoracic spine. Electronically Signed: By: Merilyn Baba M.D. On: 08/13/2021 15:19   NM Pulmonary Perfusion  Result Date: 08/12/2021 CLINICAL DATA:  PE suspected.  Positive D-dimer. EXAM: NUCLEAR MEDICINE PERFUSION LUNG SCAN TECHNIQUE: Perfusion images were obtained in multiple projections after intravenous injection of radiopharmaceutical. Ventilation scans intentionally deferred if perfusion scan and chest x-ray adequate for interpretation during COVID 19 epidemic. RADIOPHARMACEUTICALS:  4.4 mCi Tc-2m MAA IV COMPARISON:  Chest x-ray 08/12/2021. FINDINGS: Multiplanar perfusion imaging shows scattered small areas of peripheral decreased perfusion in the mid and lower lungs bilaterally. Chest x-ray from earlier today shows patchy basilar airspace disease, left greater than right with probable left effusion. IMPRESSION: Study is indeterminate for the presence of acute pulmonary embolus. Electronically Signed   By: Misty Stanley M.D.   On: 08/12/2021 13:28   US RENAL  Result Date: 08/14/2021 CLINICAL DATA:  Renal failure EXAM: RENAL / URINARY TRACT ULTRASOUND COMPLETE COMPARISON:  Renal ultrasound 08/12/2021 FINDINGS: Right Kidney: Renal measurements: 8.8 x 4.2 x 4.4 cm = volume: 85 mL. Increased echogenicity with no hydronephrosis. Left Kidney: Renal measurements: 8.3 x 3.8 x 3.5 cm = volume: 57 mL. Mildly increased echogenicity. No hydronephrosis. Bladder: Appears normal for degree of bladder distention. Other: Prostate gland is enlarged  with nodular protrusion into the base of the urinary bladder. Right pleural effusion. IMPRESSION: 1. Increased echogenicity of the kidneys suggesting medical renal disease. No hydronephrosis. 2. Prostatomegaly. 3. Right pleural effusion. Electronically Signed   By: Ofilia Neas M.D.   On: 08/14/2021 12:07   US Venous Img Lower Bilateral (DVT)  Result Date: 08/12/2021 CLINICAL DATA:  Positive D-dimer concern for DVT EXAM: BILATERAL LOWER EXTREMITY VENOUS DOPPLER ULTRASOUND TECHNIQUE: Gray-scale sonography with graded compression, as well as color Doppler and duplex ultrasound were performed to evaluate the lower extremity deep venous systems from the level of the common femoral vein and including the common femoral, femoral, profunda femoral, popliteal and calf veins including the posterior tibial, peroneal and gastrocnemius veins when visible. The superficial great saphenous vein was also interrogated. Spectral Doppler was utilized to evaluate flow at rest and with distal augmentation maneuvers in the common femoral, femoral and popliteal veins. COMPARISON:  None. FINDINGS: RIGHT LOWER EXTREMITY Common Femoral Vein: No evidence of thrombus. Normal compressibility, respiratory phasicity and response to augmentation. Saphenofemoral Junction: No evidence of thrombus. Normal compressibility and flow on color Doppler imaging. Profunda Femoral Vein: No evidence of thrombus. Normal compressibility and flow on color Doppler imaging. Femoral Vein: No evidence of thrombus. Normal compressibility, respiratory phasicity and response to augmentation. Popliteal Vein: No evidence of thrombus. Normal compressibility, respiratory phasicity and response to augmentation. Calf Veins: No evidence of thrombus. Normal compressibility and flow on color Doppler imaging. Superficial Great Saphenous Vein: No evidence of thrombus. Normal compressibility. Venous Reflux:  None. Other Findings:  None. LEFT LOWER EXTREMITY Common Femoral  Vein: No evidence of thrombus. Normal compressibility, respiratory phasicity and response to augmentation. Saphenofemoral Junction: No evidence of thrombus. Normal compressibility and flow on color Doppler imaging. Profunda Femoral Vein: No evidence of thrombus. Normal compressibility and flow on color Doppler imaging. Femoral  Vein: No evidence of thrombus. Normal compressibility, respiratory phasicity and response to augmentation. Popliteal Vein: No evidence of thrombus. Normal compressibility, respiratory phasicity and response to augmentation. Calf Veins: No evidence of thrombus. Normal compressibility and flow on color Doppler imaging. Superficial Great Saphenous Vein: No evidence of thrombus. Normal compressibility. Venous Reflux:  None. Other Findings:  None. IMPRESSION: No evidence of deep venous thrombosis in either lower extremity. Electronically Signed   By: Dahlia Bailiff M.D.   On: 08/12/2021 19:18   US Abdomen Limited RUQ (LIVER/GB)  Result Date: 08/14/2021 CLINICAL DATA:  Elevated liver function studies. EXAM: ULTRASOUND ABDOMEN LIMITED RIGHT UPPER QUADRANT COMPARISON:  CT scan 06/04/2021 FINDINGS: Gallbladder: Echogenic shadowing gallstones are noted in the gallbladder. The largest calculus measures 1.3 cm. There is also mild gallbladder wall thickening and pericholecystic fluid. Common bile duct: Diameter: 9.0 mm Liver: Normal echogenicity without focal lesion or biliary dilatation. Portal vein is patent on color Doppler imaging with normal direction of blood flow towards the liver. Other: Large right pleural effusion is noted. IMPRESSION: 1. Cholelithiasis and findings suspicious for acute cholecystitis. 2. Dilated common bile duct could suggest an obstructing common bile duct stone. MRI abdomen/MRCP may be helpful for further evaluation. 3. Right pleural effusion. Electronically Signed   By: Marijo Sanes M.D.   On: 08/14/2021 09:43     Medications:    sodium chloride 75 mL/hr at 08/14/21  0041    insulin aspart  10 Units Intravenous Once   And   dextrose  25 mL Intravenous Once   enoxaparin (LOVENOX) injection  30 mg Subcutaneous Q24H   feeding supplement  237 mL Oral BID BM   pantoprazole  40 mg Oral BID   acetaminophen **OR** acetaminophen, magnesium hydroxide, morphine injection, ondansetron **OR** ondansetron (ZOFRAN) IV, oxyCODONE, traZODone  Assessment/ Plan:  Mr. Kordel Leavy is a 87 y.o.  male with a past medical history including CAD with CABG, diastolic CHF, hypertension, CVA, and chronic kidney disease stage IIIa.  Patient is admitted for Shortness of breath [R06.02] Hyperkalemia [E87.5] Positive D dimer [R79.89] Elevated troponin [R77.8] Swelling of arm [M79.89] Generalized weakness [R53.1] AKI (acute kidney injury) ([place]) [N17.9]   Acute Kidney Injury on chronic kidney disease stage 3a with baseline creatinine 1.3 and GFR of 54 on 06/18/21.  Acute kidney injury with unclear etiology at this time Risk factors include hypertension and diastolic heart failure. Olmasartan and furosemide held Renal ultrasound negative for obstruction.  Initial renal ultrasound completed 08/12/2021 negative for obstruction with minimal visual of pleural effusion.  Repeat renal ultrasound today shows no obstruction with right pleural effusion and prostatomegaly. No acute need for dialysis Creatinine increased today to 3.  Recorded urine output of 900 mL in preceding 24 hours.  Continue IV hydration at this time and will monitor closely  Lab Results  Component Value Date   CREATININE 3.01 (H) 08/14/2021   CREATININE 2.76 (H) 08/13/2021   CREATININE 3.26 (H) 08/12/2021    Intake/Output Summary (Last 24 hours) at 08/14/2021 1256 Last data filed at 08/14/2021 0300 Gross per 24 hour  Intake 3122.75 ml  Output 400 ml  Net 2722.75 ml    2. Hypertension with chronic kidney disease Home regimen includes Amlodipine, olmesartan and furosemide All held at this time.  BP  123/51  3.Diastolic chronic heart failure: Echo on 04/10/21 shows EF of 25-30%.  Continue IV hydration with close monitoring of fluid status  4. Hyperkalemia/hyponatremia Currently 6.0. kayexalate ordered by primary team. Sodium 131. Appears to  run slightly low for the past month.    LOS: 2 [place] 11/9/202212:56 PM

## 2021-08-14 NOTE — Progress Notes (Signed)
PT Cancellation Note  Patient Details Name: Daniel Bolton MRN: [id number] DOB: June 29, 1934   Cancelled Treatment:    Reason Eval/Treat Not Completed: Other (comment)    Per chart review, pt with potassium of 6.0 which is contraindicated for therapeutic intervention. PT will hold and re-attempt when pt is medically able to safely participate.   [employer], SPT

## 2021-08-14 NOTE — Progress Notes (Signed)
OT Cancellation Note  Patient Details Name: Daniel Bolton MRN: [id number] DOB: March 12, 1934   Cancelled Treatment:    Reason Eval/Treat Not Completed: Medical issues which prohibited therapy. Per chart review, pt with potassium of 6.0 which is contraindicated for therapeutic intervention. OT will hold and re-attempt when pt is medically able to safely participate.   Darleen Crocker, [place], OTR/L , CBIS ascom [phone]  08/14/21, 8:28 AM

## 2021-08-14 NOTE — Progress Notes (Signed)
PROGRESS NOTE    Daniel Bolton  OZD:[id number] DOB: [birth year] DOA: 08/11/2021 PCP: [name], MD   Chief Complaint  Patient presents with   Weakness    Brief Narrative: 87 y.o. Hispanic male with medical history significant for essential hypertension, CAD status post CABG, CHF, chronic kidney disease and CVA, who presented to the emergency room with acute onset   generalized weakness since Friday with diminished appetite and oral intake.  He's been found to have AKI as well as generalized weakness.  Being treated for rhabdo with IVF (caution given hx HF).  Other potential causes for his weakness being evaluated as noted below.    11/9 family at bedside. No overnight issues.  Assessment & Plan:   Active Problems:   Moderate major depression, single episode (HCC)   Coronary artery disease   AKI (acute kidney injury) ([place])   Normochromic normocytic anemia   Essential hypertension   Hyponatremia   Acute on chronic combined systolic and diastolic CHF (congestive heart failure) (HCC)   HLD (hyperlipidemia)   Hyperkalemia   AAA (abdominal aortic aneurysm)   Elevated troponin  Bilateral Lower Extremity Weakness  Generalized Weakness Unclear etiology, before Friday, up and walking with walker - after Friday, wasn't able to  Granddaughter noted incontinence of bowel as well as numbness in legs as well Weakness could be from neurologic causes vs rhabdo/myositis 11/9 mri with cervical spinal canal stenosis and neural foraminal narrowing-see reports. Lidocaine patch for back pain.  F/u with pcp as outpt  AKI  Hyperkalemia UA with hg, but no RBC Elevated LFT's Suspect rhabdo (urine myoglobin positive) 11/9 renal US suggesting med-renal dz. No hydron. Prostatomegaly. Continue ivf -caution with h/xo chf Ck rising K 6.0, ekg this am no peaked T waves Give kayxelate 30gm.  Give 10 units insulin Will reck K this afternoon Hold benicar and hctz Will f/u with  nephrology   Rhabdomyolysis UA Heme + and only 0-5 RBC's He's on amlodipine/simvastatin -> increases risk of rhabdo (sounds like he's been on this combination for a while -> regimen should be adjusted at discharge) - continue to hold  Also with weakness, could have been in one position for a while CK rising, continuing IVF  Consider possibility of inflammatory myositis? as well, though will monitor for now with treatment for rhabdo, consider discussion with [facility] rheumatology if worsening/not improving - unclear sig of inflammatory markers at this time, will follow TSH elevated mildly, follow free T4 11/9 ck rising Tea colored urine Will f/u with nephrology Continue ivf  Elevated LFT's 2/2 rhabdo  Negative acute hepatitis panel 11/9 abdominal ultrasound with cholelithiasis and findings suspicious for acute cholecystitis.  Dilated common bile duct suggestive of obstructing common bile duct stone.  MRI abdomen and MRCP can be helpful. Right pleural effusion Will consult Gi and surgery   Chronic combined systolic and diastolic HF EF 25-95%, global hypokinesis 04/2021, grade II diastolic dysfunction, moderately reduced RVSF  Monitor volume status while on ivf. Mildly volume overloaded .      Lumbar Stenosis with Radiculopathy Lumbar imaging obtained with concern of LE weakness + incontinence of stool C/T spine imaging obtained with concern for UE pain, weakness as well - follow with neurology NSGY rec considering decompression when medically stable Preoperative clearance per cardiology - high risk surgical candidate (see 11/8 note)    Elevated D dimer  RUE Swelling No evidence of DVT in RUE VQ scan pending -> indeterminate LE [place] - negative for DVT Low suspicion for  PE based on his overall picture - will wean O2, may need additional w/u if difficulty weaning   Right Pleural Effusion Will need follow up imaging  CAD with hx CABG  Elevated Troponin Aspirin, plavix still  being held in setting of GI bleed (was supposed to hold until reeval) No CP, appreciate cardiology assistance - suspect demand Appreciate cardiology assistance  Sinus Bradycardia Currently metoprolol on hold HR as low as 40's documented outpatient  Appreciate cardiology recs (see 11/8 note, signing off) Concern that this may contribute to weakness, appreciate cardiology assistance  Hypertension Dc amlodipine as bp on nml-low side Holding benicar and HCTZ  HLD Holding simvastatin given elevated CK and on amlodipine, if resuming statin at discharge should resume alternative statin or simvastatin at lower dose (after CK, LFT's improved)  GERD  Hx GI Bleed  Reflux Esophagitis  Gastric Ulcer  Erosive Gastropathy  Duodenal Ulcers PPI BID  DVT prophylaxis: lovenox Code Status: full Family Communication: Curator, son Disposition:   Status is: Inpatient  Remains inpatient appropriate because: need for further workup       Consultants:  Renal cardiology  Procedures No DVT in RUE  Antimicrobials:  Anti-infectives (From admission, onward)    None          Subjective: C/o mild back pain, no sob, or cp  Objective: Vitals:   08/13/21 2102 08/14/21 0049 08/14/21 0636 08/14/21 0812  BP: (!) 110/49 (!) 102/40 (!) 128/59 (!) 123/51  Pulse: (!) 57 (!) 58 66 64  Resp: 16 18 20 16   Temp: 97.7 F (36.5 C) 98.6 F (37 C) (!) 97.3 F (36.3 C) 98 F (36.7 C)  TempSrc: Oral Oral Oral   SpO2: 94% 94% 90% (!) 89%  Weight:      Height:        Intake/Output Summary (Last 24 hours) at 08/14/2021 0831 Last data filed at 08/14/2021 0300 Gross per 24 hour  Intake 3602.75 ml  Output 900 ml  Net 2702.75 ml   Filed Weights   08/11/21 1921  Weight: 59.9 kg    Examination: Nad, calm Decrease bs no wheezing Regular s1/s2 no gallop Soft benign +bs No edema [id number]   Data Reviewed: I have personally reviewed following labs and imaging studies  CBC: Recent  Labs  Lab 08/11/21 1930 08/12/21 0648 08/13/21 0526 08/14/21 0553  WBC 8.7 7.7 9.4 11.5*  NEUTROABS  --   --  7.9* 9.6*  HGB 9.6* 9.5* 9.0* 9.3*  HCT 29.7* 29.0* 27.3* 28.4*  MCV 84.6 85.8 85.6 84.5  PLT 190 190 196 008    Basic Metabolic Panel: Recent Labs  Lab 08/11/21 1930 08/12/21 0648 08/13/21 0526 08/14/21 0553  NA 130* 134* 134* 131*  K 5.7* 5.0 5.2* 6.0*  CL 97* 102 105 103  CO2 23 22 20* 18*  GLUCOSE 100* 79 94 113*  BUN 118* 124* 118* 118*  CREATININE 3.42* 3.26* 2.76* 3.01*  CALCIUM 8.8* 8.8* 8.3* 8.3*  MG  --   --  2.3 2.4  PHOS  --   --  6.7* 7.5*    GFR: Estimated Creatinine Clearance: 13.3 mL/min (A) (by C-G formula based on SCr of 3.01 mg/dL (H)).  Liver Function Tests: Recent Labs  Lab 08/12/21 0006 08/13/21 0526 08/14/21 0553  AST 1,049* 906* 994*  ALT 494* 448* 513*  ALKPHOS 163* 167* 183*  BILITOT 0.9 0.7 0.7  PROT 6.7 5.7* 6.3*  ALBUMIN 2.6* 2.1* 2.4*    CBG: Recent Labs  Lab  08/11/21 1938  GLUCAP 95     Recent Results (from the past 240 hour(s))  Resp Panel by RT-PCR (Flu A&B, Covid) Nasopharyngeal Swab     Status: None   Collection Time: 08/12/21 12:03 AM   Specimen: Nasopharyngeal Swab; Nasopharyngeal(NP) swabs in vial transport medium  Result Value Ref Range Status   SARS Coronavirus 2 by RT PCR NEGATIVE NEGATIVE Final    Comment: (NOTE) SARS-CoV-2 target nucleic acids are NOT DETECTED.  The SARS-CoV-2 RNA is generally detectable in upper respiratory specimens during the acute phase of infection. The lowest concentration of SARS-CoV-2 viral copies this assay can detect is 138 copies/mL. A negative result does not preclude SARS-Cov-2 infection and should not be used as the sole basis for treatment or other patient management decisions. A negative result may occur with  improper specimen collection/handling, submission of specimen other than nasopharyngeal swab, presence of viral mutation(s) within the areas targeted by  this assay, and inadequate number of viral copies(<138 copies/mL). A negative result must be combined with clinical observations, patient history, and epidemiological information. The expected result is Negative.  Fact Sheet for Patients:  EntrepreneurPulse.com.au  Fact Sheet for Healthcare Providers:  IncredibleEmployment.be  This test is no t yet approved or cleared by the [place] FDA and  has been authorized for detection and/or diagnosis of SARS-CoV-2 by FDA under an Emergency Use Authorization (EUA). This EUA will remain  in effect (meaning this test can be used) for the duration of the COVID-19 declaration under Section 564(b)(1) of the Act, 21 U.S.C.section 360bbb-3(b)(1), unless the authorization is terminated  or revoked sooner.       Influenza A by PCR NEGATIVE NEGATIVE Final   Influenza B by PCR NEGATIVE NEGATIVE Final    Comment: (NOTE) The Xpert Xpress SARS-CoV-2/FLU/RSV plus assay is intended as an aid in the diagnosis of influenza from Nasopharyngeal swab specimens and should not be used as a sole basis for treatment. Nasal washings and aspirates are unacceptable for Xpert Xpress SARS-CoV-2/FLU/RSV testing.  Fact Sheet for Patients: EntrepreneurPulse.com.au  Fact Sheet for Healthcare Providers: IncredibleEmployment.be  This test is not yet approved or cleared by the [place] FDA and has been authorized for detection and/or diagnosis of SARS-CoV-2 by FDA under an Emergency Use Authorization (EUA). This EUA will remain in effect (meaning this test can be used) for the duration of the COVID-19 declaration under Section 564(b)(1) of the Act, 21 U.S.C. section 360bbb-3(b)(1), unless the authorization is terminated or revoked.  Performed at [facility], [address], [place],  [address]          Radiology Studies: MR CERVICAL SPINE WO CONTRAST  Addendum  Date: 08/13/2021   ADDENDUM REPORT: 08/13/2021 15:23 IMPRESSION: 6.  Moderate right and trace left pleural effusions. Electronically Signed   By: Merilyn Baba M.D.   On: 08/13/2021 15:23   Result Date: 08/13/2021 CLINICAL DATA:  Upper extremity weakness, incontinence, leg numbness EXAM: MRI CERVICAL AND THORACIC SPINE WITHOUT CONTRAST TECHNIQUE: Multiplanar and multiecho pulse sequences of the cervical spine, to include the craniocervical junction and cervicothoracic junction, and the thoracic spine, were obtained without intravenous contrast. COMPARISON:  No prior MRI, correlation is made with CT cervical spine 06/14/2021 FINDINGS: MRI CERVICAL SPINE FINDINGS Alignment: Unchanged trace anterolisthesis C4 on C5 and C5 on C6. Vertebrae: No acute fracture or suspicious osseous lesion. Congenitally short pedicles, which narrow the AP diameter of the spinal canal. Cord: Normal signal and morphology. Posterior Fossa, vertebral arteries, paraspinal tissues: Negative.  Disc levels: C2-C3: No significant disc bulge. Facet arthropathy and right-greater-than-left uncovertebral hypertrophy. No spinal canal stenosis. Mild right neural foraminal narrowing. C3-C4: Central disc extrusion with 4 mm of caudal migration, which indents the ventral spinal cord. Facet arthropathy. Mild to moderate bilateral neural foraminal narrowing. Moderate spinal canal stenosis. C4-C5: Disc bulge with superimposed central protrusion, which indents the ventral cord. Ligamentum flavum hypertrophy. Left greater than right facet and uncovertebral hypertrophy. Moderate to severe spinal canal stenosis. Moderate bilateral neural foraminal narrowing. C5-C6: Mild anterolisthesis and disc unroofing. Facet arthropathy. Mild bilateral neural foraminal narrowing. No spinal canal stenosis. C6-C7: Minimal disc bulge. Left-greater-than-right uncovertebral hypertrophy. Facet arthropathy. No spinal canal stenosis. Moderate bilateral neural foraminal narrowing.  C7-T1: No significant disc bulge. Facet arthropathy. No spinal canal stenosis. Mild bilateral neural foraminal narrowing. MRI THORACIC SPINE FINDINGS Alignment:  No listhesis. Vertebrae: No acute fracture or suspicious osseous lesion. Cord:  Normal in signal and morphology. Paraspinal and other soft tissues: Moderate right and trace left pleural effusion. Disc levels: Mild degenerative changes without significant spinal canal stenosis or high-grade neural foraminal narrowing. IMPRESSION: 1. No evidence of cord signal abnormality in the cervical or thoracic spine. 2. C4-C5 moderate to severe spinal canal stenosis and moderate bilateral neural foraminal narrowing. 3. C3-C4 moderate spinal canal stenosis and mild-to-moderate bilateral neural foraminal narrowing. 4. C6-C7 moderate bilateral neural foraminal narrowing. 5. No significant spinal canal stenosis or neural foraminal narrowing in the thoracic spine. Electronically Signed: By: Merilyn Baba M.D. On: 08/13/2021 15:19   MR THORACIC SPINE WO CONTRAST  Addendum Date: 08/13/2021   ADDENDUM REPORT: 08/13/2021 15:23 IMPRESSION: 6.  Moderate right and trace left pleural effusions. Electronically Signed   By: Merilyn Baba M.D.   On: 08/13/2021 15:23   Result Date: 08/13/2021 CLINICAL DATA:  Upper extremity weakness, incontinence, leg numbness EXAM: MRI CERVICAL AND THORACIC SPINE WITHOUT CONTRAST TECHNIQUE: Multiplanar and multiecho pulse sequences of the cervical spine, to include the craniocervical junction and cervicothoracic junction, and the thoracic spine, were obtained without intravenous contrast. COMPARISON:  No prior MRI, correlation is made with CT cervical spine 06/14/2021 FINDINGS: MRI CERVICAL SPINE FINDINGS Alignment: Unchanged trace anterolisthesis C4 on C5 and C5 on C6. Vertebrae: No acute fracture or suspicious osseous lesion. Congenitally short pedicles, which narrow the AP diameter of the spinal canal. Cord: Normal signal and morphology.  Posterior Fossa, vertebral arteries, paraspinal tissues: Negative. Disc levels: C2-C3: No significant disc bulge. Facet arthropathy and right-greater-than-left uncovertebral hypertrophy. No spinal canal stenosis. Mild right neural foraminal narrowing. C3-C4: Central disc extrusion with 4 mm of caudal migration, which indents the ventral spinal cord. Facet arthropathy. Mild to moderate bilateral neural foraminal narrowing. Moderate spinal canal stenosis. C4-C5: Disc bulge with superimposed central protrusion, which indents the ventral cord. Ligamentum flavum hypertrophy. Left greater than right facet and uncovertebral hypertrophy. Moderate to severe spinal canal stenosis. Moderate bilateral neural foraminal narrowing. C5-C6: Mild anterolisthesis and disc unroofing. Facet arthropathy. Mild bilateral neural foraminal narrowing. No spinal canal stenosis. C6-C7: Minimal disc bulge. Left-greater-than-right uncovertebral hypertrophy. Facet arthropathy. No spinal canal stenosis. Moderate bilateral neural foraminal narrowing. C7-T1: No significant disc bulge. Facet arthropathy. No spinal canal stenosis. Mild bilateral neural foraminal narrowing. MRI THORACIC SPINE FINDINGS Alignment:  No listhesis. Vertebrae: No acute fracture or suspicious osseous lesion. Cord:  Normal in signal and morphology. Paraspinal and other soft tissues: Moderate right and trace left pleural effusion. Disc levels: Mild degenerative changes without significant spinal canal stenosis or high-grade neural foraminal narrowing. IMPRESSION: 1. No evidence  of cord signal abnormality in the cervical or thoracic spine. 2. C4-C5 moderate to severe spinal canal stenosis and moderate bilateral neural foraminal narrowing. 3. C3-C4 moderate spinal canal stenosis and mild-to-moderate bilateral neural foraminal narrowing. 4. C6-C7 moderate bilateral neural foraminal narrowing. 5. No significant spinal canal stenosis or neural foraminal narrowing in the thoracic  spine. Electronically Signed: By: Merilyn Baba M.D. On: 08/13/2021 15:19   MR LUMBAR SPINE WO CONTRAST  Result Date: 08/12/2021 CLINICAL DATA:  Acute onset of generalized weakness beginning 3 days ago EXAM: MRI LUMBAR SPINE WITHOUT CONTRAST TECHNIQUE: Multiplanar, multisequence MR imaging of the lumbar spine was performed. No intravenous contrast was administered. COMPARISON:  None. FINDINGS: Segmentation:  5 lumbar type vertebral bodies assumed. Alignment: Minimal scoliotic curvature. 2 mm degenerative anterolisthesis L4-5. Vertebrae:  No fracture or focal bone lesion. Conus medullaris and cauda equina: Conus extends to the L1-2 level. Conus and cauda equina appear normal. Paraspinal and other soft tissues: Negative Disc levels: T12-L1: Normal L1-2: Very tiny central disc protrusion with slight caudal down turning in the midline. No significant compressive effect upon the canal. Foramina widely patent. L2-3: Bulging of the disc. Mild facet and ligamentous hypertrophy. Mild stenosis of both lateral recesses, not likely compressive. L3-4: Shallow protrusion of the disc. Facet and ligamentous hypertrophy. Moderate multifactorial stenosis at this level that could cause neural compression on either or both sides. L4-5: Chronic facet arthritis with 2 mm of degenerative anterolisthesis. Large broad-based disc herniation with upward migration of disc material centrally and towards the left. Severe spinal stenosis at this level could cause neural compression on either or both sides. The upper migrated disc material on the left could focally compress the left L4 nerve. L5-S1: Endplate osteophytes and bulging of the disc. Facet and ligamentous hypertrophy. Stenosis of both subarticular lateral recesses that could compress either or both S1 nerves. IMPRESSION: At L4-5, there is facet arthropathy with 2 mm of anterolisthesis. There is a broad-based disc herniation with upward migration of extruded disc material centrally  and towards the left behind L4. Severe spinal stenosis at the disc level could cause neural compression on either or both sides. The upwardly migrated disc material could focally compress the left L4 nerve. L3-4: Shallow disc protrusion. Facet and ligamentous hypertrophy. Moderate multifactorial stenosis that could cause neural compression on either or both sides. L5-S1: Bulging of the disc. Facet and ligamentous hypertrophy. Bilateral subarticular lateral recess narrowing that could possibly affect either S1 nerve. L2-3: Disc bulge. Mild facet hypertrophy. Mild narrowing of the lateral recesses without likely neural compression. Electronically Signed   By: Nelson Chimes M.D.   On: 08/12/2021 12:49   NM Pulmonary Perfusion  Result Date: 08/12/2021 CLINICAL DATA:  PE suspected.  Positive D-dimer. EXAM: NUCLEAR MEDICINE PERFUSION LUNG SCAN TECHNIQUE: Perfusion images were obtained in multiple projections after intravenous injection of radiopharmaceutical. Ventilation scans intentionally deferred if perfusion scan and chest x-ray adequate for interpretation during COVID 19 epidemic. RADIOPHARMACEUTICALS:  4.4 mCi Tc-105m MAA IV COMPARISON:  Chest x-ray 08/12/2021. FINDINGS: Multiplanar perfusion imaging shows scattered small areas of peripheral decreased perfusion in the mid and lower lungs bilaterally. Chest x-ray from earlier today shows patchy basilar airspace disease, left greater than right with probable left effusion. IMPRESSION: Study is indeterminate for the presence of acute pulmonary embolus. Electronically Signed   By: Misty Stanley M.D.   On: 08/12/2021 13:28   US Venous Img Lower Bilateral (DVT)  Result Date: 08/12/2021 CLINICAL DATA:  Positive D-dimer concern for DVT EXAM:  BILATERAL LOWER EXTREMITY VENOUS DOPPLER ULTRASOUND TECHNIQUE: Gray-scale sonography with graded compression, as well as color Doppler and duplex ultrasound were performed to evaluate the lower extremity deep venous systems from  the level of the common femoral vein and including the common femoral, femoral, profunda femoral, popliteal and calf veins including the posterior tibial, peroneal and gastrocnemius veins when visible. The superficial great saphenous vein was also interrogated. Spectral Doppler was utilized to evaluate flow at rest and with distal augmentation maneuvers in the common femoral, femoral and popliteal veins. COMPARISON:  None. FINDINGS: RIGHT LOWER EXTREMITY Common Femoral Vein: No evidence of thrombus. Normal compressibility, respiratory phasicity and response to augmentation. Saphenofemoral Junction: No evidence of thrombus. Normal compressibility and flow on color Doppler imaging. Profunda Femoral Vein: No evidence of thrombus. Normal compressibility and flow on color Doppler imaging. Femoral Vein: No evidence of thrombus. Normal compressibility, respiratory phasicity and response to augmentation. Popliteal Vein: No evidence of thrombus. Normal compressibility, respiratory phasicity and response to augmentation. Calf Veins: No evidence of thrombus. Normal compressibility and flow on color Doppler imaging. Superficial Great Saphenous Vein: No evidence of thrombus. Normal compressibility. Venous Reflux:  None. Other Findings:  None. LEFT LOWER EXTREMITY Common Femoral Vein: No evidence of thrombus. Normal compressibility, respiratory phasicity and response to augmentation. Saphenofemoral Junction: No evidence of thrombus. Normal compressibility and flow on color Doppler imaging. Profunda Femoral Vein: No evidence of thrombus. Normal compressibility and flow on color Doppler imaging. Femoral Vein: No evidence of thrombus. Normal compressibility, respiratory phasicity and response to augmentation. Popliteal Vein: No evidence of thrombus. Normal compressibility, respiratory phasicity and response to augmentation. Calf Veins: No evidence of thrombus. Normal compressibility and flow on color Doppler imaging. Superficial  Great Saphenous Vein: No evidence of thrombus. Normal compressibility. Venous Reflux:  None. Other Findings:  None. IMPRESSION: No evidence of deep venous thrombosis in either lower extremity. Electronically Signed   By: Dahlia Bailiff M.D.   On: 08/12/2021 19:18        Scheduled Meds:  amLODipine  10 mg Oral Daily   enoxaparin (LOVENOX) injection  30 mg Subcutaneous Q24H   feeding supplement  237 mL Oral BID BM   pantoprazole  40 mg Oral BID   sodium zirconium cyclosilicate  10 g Oral Once   Continuous Infusions:  sodium chloride 75 mL/hr at 08/14/21 0041     LOS: 2 days    Time spent: 35 minutes with more than 50% on [place], MD [facility]   To contact the attending provider between 7A-7P or the covering provider during after hours 7P-7A, please log into the web site [email or web address] and access using universal [place] password for that web site. If you do not have the password, please call the hospital operator.  08/14/2021, 8:31 AM

## 2021-08-14 NOTE — Consult Note (Signed)
[facility] SURGICAL CONSULTATION NOTE (initial) - cpt: 40814   HISTORY OF PRESENT ILLNESS (HPI):  87 y.o. male presented to [facility] ED on 11/06 for evaluation of weakness. At the time of presentation to the ED, patient's daughter had been reporting progressive weakness and decreased PO intake over the course of the previous 48 hours. Additionally, there was reports of a fall in which he slid slowly to the floor. Typically uses a walker to ambulate. No reports of fever, chills, nausea, emesis, or diarrhea. Work up in the ED revealed an acute on chronic kidney injury with sCr - 3.42 (baseline is 1.3 - 1.7), he did have myoglobin found in his urine concerning for possible rhabdomyolysis, high sensitivity troponin was also elevated. He was ultimately admitted to the medicine service with cardiology consultation. 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 Neurosurgery was consulted as well for this. Additionally, he was found to have significant LFT elevations with AST to 1049 (now 994) and ALT 494 (now 513), alkaline phosphatase to 163 (now 183). Interestingly, his total bilirubin has been normal from 0.9 - 0.7 throughout this admission. He underwent RUQ [place] this morning which showed a large gallstone in the neck of the gallbladder with possible wall changes suggestive of cholecystitis; CBD noted at 9 mm.   Surgery is consulted by hospitalist physician Dr. Nolberto Hanlon, MD in this context for evaluation and management of possible cholecystitis.  PAST MEDICAL HISTORY (PMH):  Past Medical History:  Diagnosis Date   CHF (congestive heart failure) (HCC)    Chronic kidney disease    Hypertension    Hyponatremia    Stroke ([place])      PAST SURGICAL HISTORY ([place]):  Past Surgical History:  Procedure Laterality Date   BRAIN SURGERY     CARDIAC SURGERY     ESOPHAGOGASTRODUODENOSCOPY N/A 06/15/2021    Procedure: ESOPHAGOGASTRODUODENOSCOPY (EGD);  Surgeon: Annamaria Helling, DO;  Location: [facility] ENDOSCOPY;  Service: Gastroenterology;  Laterality: N/A;     MEDICATIONS:  Prior to Admission medications   Medication Sig Start Date End Date Taking? Authorizing Provider  amLODipine (NORVASC) 10 MG tablet Take 1 tablet (10 mg total) by mouth daily. 06/07/21  Yes Jennye Boroughs, MD  feeding supplement (ENSURE ENLIVE / ENSURE PLUS) LIQD Take 237 mLs by mouth 2 (two) times daily between meals. 04/14/21  Yes Enzo Bi, MD  furosemide (LASIX) 40 MG tablet Take 1 tablet (40 mg total) by mouth daily. 04/14/21 08/12/21 Yes Enzo Bi, MD  metoprolol succinate (TOPROL-XL) 50 MG 24 hr tablet Take 0.5 tablets (25 mg total) by mouth daily. Take with or immediately following a meal. 06/06/21  Yes Jennye Boroughs, MD  olmesartan (BENICAR) 20 MG tablet Take 20 mg by mouth daily.   Yes [provider]  pantoprazole (PROTONIX) 40 MG tablet Take 1 tablet (40 mg total) by mouth 2 (two) times daily. 06/18/21  Yes Aline August, MD  simvastatin (ZOCOR) 80 MG tablet Take 80 mg by mouth daily.   Yes [provider]     ALLERGIES:  Allergies  Allergen Reactions   Hydralazine     Pt has severe dizziness with it, and does not want to take it.     SOCIAL HISTORY:  Social History   Socioeconomic History   Marital status: Married    Spouse name: Not on file   Number of children: Not on file   Years  of education: Not on file   Highest education level: Not on file  Occupational History   Not on file  Tobacco Use   Smoking status: Never   Smokeless tobacco: Never  Substance and Sexual Activity   Alcohol use: No   Drug use: No   Sexual activity: Not on file  Other Topics Concern   Not on file  Social History Narrative   Not on file   Social Determinants of Health   Financial Resource Strain: Not on file  Food Insecurity: Not on file  Transportation Needs: Not on file  Physical Activity: Not  on file  Stress: Not on file  Social Connections: Not on file  Intimate Partner Violence: Not on file     FAMILY HISTORY:  Family History  Problem Relation Age of Onset   Cancer Sister    Heart disease Sister       REVIEW OF SYSTEMS:  Review of Systems  Constitutional:  Positive for malaise/fatigue. Negative for chills and fever.  HENT:  Negative for congestion and sore throat.   Respiratory:  Positive for cough and sputum production. Negative for shortness of breath.   Cardiovascular:  Negative for chest pain and palpitations.  Gastrointestinal:  Positive for abdominal pain. Negative for diarrhea, nausea and vomiting.  Musculoskeletal:  Positive for falls and myalgias.  All other systems reviewed and are negative.  VITAL SIGNS:  Temp:  [97.3 F (36.3 C)-98.6 F (37 C)] 98 F (36.7 C) (11/09 0812) Pulse Rate:  [53-66] 61 (11/09 1313) Resp:  [16-20] 20 (11/09 1313) BP: (102-140)/(40-59) 140/51 (11/09 1313) SpO2:  [89 %-98 %] 94 % (11/09 1313)     Height: 5\' 1"  (154.9 cm) Weight: 59.9 kg BMI (Calculated): 24.95   INTAKE/OUTPUT:  11/08 0701 - 11/09 0700 In: 3602.8 [P.O.:800; I.V.:2802.8] Out: 900 [Urine:900]  PHYSICAL EXAM:  Physical Exam Vitals and nursing note reviewed. Exam conducted with a chaperone present.  Constitutional:      General: He is not in acute distress.    Appearance: He is not ill-appearing.     Interventions: Nasal cannula in place.     Comments: Patient resting in bed; daughter and wife at bedside   HENT:     Head: Normocephalic and atraumatic.     Mouth/Throat:     Mouth: Mucous membranes are moist.     Pharynx: Oropharynx is clear.  Eyes:     General: No scleral icterus.    Conjunctiva/sclera: Conjunctivae normal.  Cardiovascular:     Rate and Rhythm: Normal rate and regular rhythm.     Pulses: Normal pulses.  Pulmonary:     Effort: Pulmonary effort is normal. No respiratory distress.     Comments: He does have a productive cough with  clear sputum  Abdominal:     General: Abdomen is flat. There is no distension.     Palpations: Abdomen is soft.     Tenderness: There is abdominal tenderness (Mild) in the right upper quadrant. There is no guarding or rebound.  Genitourinary:    Comments: Deferred Musculoskeletal:     Right lower leg: Edema present.     Left lower leg: Edema present.  Skin:    General: Skin is warm and dry.     Coloration: Skin is not jaundiced.     Findings: No erythema.  Neurological:     General: No focal deficit present.     Mental Status: He is alert. Mental status is at baseline.  Psychiatric:  Mood and Affect: Mood normal.        Behavior: Behavior normal.     Labs:  CBC Latest Ref Rng & Units 08/14/2021 08/13/2021 08/12/2021  WBC 4.0 - 10.5 K/uL 11.5(H) 9.4 7.7  Hemoglobin 13.0 - 17.0 g/dL 9.3(L) 9.0(L) 9.5(L)  Hematocrit 39.0 - 52.0 % 28.4(L) 27.3(L) 29.0(L)  Platelets 150 - 400 K/uL 223 196 190   CMP Latest Ref Rng & Units 08/14/2021 08/13/2021 08/12/2021  Glucose 70 - 99 mg/dL 113(H) 94 79  BUN 8 - 23 mg/dL 118(H) 118(H) 124(H)  Creatinine 0.61 - 1.24 mg/dL 3.01(H) 2.76(H) 3.26(H)  Sodium 135 - 145 mmol/L 131(L) 134(L) 134(L)  Potassium 3.5 - 5.1 mmol/L 6.0(H) 5.2(H) 5.0  Chloride 98 - 111 mmol/L 103 105 102  CO2 22 - 32 mmol/L 18(L) 20(L) 22  Calcium 8.9 - 10.3 mg/dL 8.3(L) 8.3(L) 8.8(L)  Total Protein 6.5 - 8.1 g/dL 6.3(L) 5.7(L) 6.7  Total Bilirubin 0.3 - 1.2 mg/dL 0.7 0.7 0.9  Alkaline Phos 38 - 126 U/L 183(H) 167(H) 163(H)  AST 15 - 41 U/L 994(H) 906(H) 1,049(H)  ALT 0 - 44 U/L 513(H) 448(H) 494(H)     Imaging studies:   RUQ [place] (08/14/2021) personally reviewed which shows a large gallstone near neck of gallbladder, some wall changes, CBD dilated expected for age, and radiologist report reviewed below:  IMPRESSION: 1. Cholelithiasis and findings suspicious for acute cholecystitis. 2. Dilated common bile duct could suggest an obstructing common bile duct stone. MRI  abdomen/MRCP may be helpful for further evaluation. 3. Right pleural effusion.   Assessment/Plan: (ICD-10's: K33.20) 87 y.o. male presenting with weakness and falls at home found to have acute on chronic kidney injury with likely rhabdomyolysis incidentally found to have significant transaminitis with cholelithiasis and possible cholecystitis   - Difficult to say if he definitively has cholecystitis given his lack of leukocytosis and without significant pain on examination. Will obtain HIDA scan to definitively rule in cholecystitis. If this is positive, we will recommend percutaneous cholecystostomy tube placement in this setting given his significant acute issues and comorbidities.     - Unsure etiology to his significant transaminitis. Interestingly, his total bilirubin levels have remained normal. Medicine consulted GI; awaiting  input  - Monitor abdominal examination - Pain control prn (will need to hold narcotic pain medications for HIDA); antiemetics prn - Monitor LFTs   - Further management per primary service  All of the above findings and recommendations were discussed with the patient and his family (daughter at bedside helps translate), and all of patient's family's questions were answered to their expressed satisfaction.  Thank you for the opportunity to participate in this patient's care.   -- Edison Simon, PA-C [facility] 08/14/2021, 1:38 PM [phone] M-F: 7am - 4pm

## 2021-08-14 NOTE — Progress Notes (Signed)
Pharmacy Antibiotic Note  Daniel Bolton is a 87 y.o. male with medical history significant for essential hypertension, CAD status post CABG, CHF, chronic kidney disease and CVA, who presented to the emergency room with acute onset generalized weakness and was admitted on 08/11/2021. Pharmacy has been consulted for Zosyn dosing for intra-abdominal infection. Pt with AKI; nephro following - no acute need for dialysis.  Plan: Zosyn 2.25 g IV q6h Monitor renal function and adjust dose as clinically indicated  Height: 5\' 1"  (154.9 cm) Weight: 59.9 kg (132 lb) IBW/kg (Calculated) : 52.3  Temp (24hrs), Avg:97.9 F (36.6 C), Min:97.3 F (36.3 C), Max:98.6 F (37 C)  Recent Labs  Lab 08/11/21 1930 08/12/21 0648 08/13/21 0526 08/14/21 0553  WBC 8.7 7.7 9.4 11.5*  CREATININE 3.42* 3.26* 2.76* 3.01*    Estimated Creatinine Clearance: 13.3 mL/min (A) (by C-G formula based on SCr of 3.01 mg/dL (H)).    Allergies  Allergen Reactions   Hydralazine     Pt has severe dizziness with it, and does not want to take it.    Antimicrobials this admission: 11/9 Zosyn >>    Thank you for allowing pharmacy to be a part of this patient's care.  Forde Dandy Jasper Hanf 08/14/2021 4:48 PM

## 2021-08-14 NOTE — Evaluation (Signed)
Clinical/Bedside Swallow Evaluation Patient Details  Name: Daniel Bolton MRN: [id number] Date of Birth: Jul 22, 1934  Today's Date: 08/14/2021 Time: SLP Start Time (ACUTE ONLY): 1545 SLP Stop Time (ACUTE ONLY): 1645 SLP Time Calculation (min) (ACUTE ONLY): 60 min  Past Medical History:  Past Medical History:  Diagnosis Date   CHF (congestive heart failure) ([place])    Chronic kidney disease    Hypertension    Hyponatremia    Stroke [facility])    Past Surgical History:  Past Surgical History:  Procedure Laterality Date   BRAIN SURGERY     CARDIAC SURGERY     ESOPHAGOGASTRODUODENOSCOPY N/A 06/15/2021   Procedure: ESOPHAGOGASTRODUODENOSCOPY (EGD);  Surgeon: Annamaria Helling, DO;  Location: [facility] ENDOSCOPY;  Service: Gastroenterology;  Laterality: N/A;   HPI:  Pt is a 87 y.o. male seen for evaluation of severe transaminitis with cholelithiasis and possible cholecystitis at the request of Dr. Nolberto Hanlon. Pt has a PMH of HTN, HLD, Hx of CVA, CKD Stage IIIb, COPD, CAD, Hx of CABG x3 in 2003, Hx of recent GI bleed 2/2 peptic ulcer disease treated with bipolar cautery 06/2021 by Dr. Virgina Jock. Pt presented to the [facility] ED 11/6 for generalized weakness, falls at home, and reduced oral intake. Patient is non-English speaking and video interpretor is used to help with exam. Patient lives at home with daughter and son-in-law. Patient's daughter, Daniel Bolton, provides most of the history. Upon presentation to the ED, labs were significant for acute on chronic kidney injury with serum creatinine 3.42 (baseline Cr 1.3 - 1.7), BUN 118, hemoglobin 9.6, hematocrit 29.7, hs-troponin 111, sodium 130, potassium 5.7, creatinine kinase 2617, ESR 93. He was also found to have significant transaminitis with AST 1049, ALT 494. His alk phos was 163 and tbili within normal limits at 0.9-0.7. Urinalysis was significant for proteinuria and positive myoglobin. He began treatment with IVF hydration for possible rhabdomyolysis. He had  MRI of C/T/L spines concerning for stenosis in mid-lumbar spine. He had RUQ US performed this morning showing large gallstone in neck of gallbladder with possible wall changes suggestive of cholecystitis and dilated common bile duct at 9.0 mm. General Surgery has been consulted. Daughter reports that her father has been complaining of abdominal pain over the past several days.   In the emergency department, he was found to have AKI with evidence of rhabdomyolysis. He also has a history of heart failure and chronic kidney disease.  On 07/31/21- patient presented for evaluation of Post COVID19 dyspnea w/ Pulmonolgy after testing Positive ~1 week earlier.   CXR at admit: Cardiomegaly, pulmonary vascular congestion and possible small left  pleural effusion.    Assessment / Plan / Recommendation  Clinical Impression  Pt and Family seen via use of Video Interpreter; pt is Spanish-speaking. He was able to engage intermittently w/ the Interpreter but often had eyes closed and exhibited decreased awareness of conversation/tasks. MOD+ cues required intermittently.     Pt appears to present w/ a degree of oropharyngeal phase dysphagia in light of declined Cognitive status and declined Medical status currently. Per chart notes and Family report, pt has been declining in Medical Status since the last hospitalization/EGDs in 06/2021. His overall oral intake has been declining as well. Per EGD in 06/2021, pt had "(ulcers, non-bleeding gastric ulcers, and erosive gastropathy/esophagitis).  Any decline in Cognitive awareness and Medical status can impact overall awareness/timing of swallow and safety during po tasks which increases risk for aspiration, choking. Pt's risk for aspiration is present but can be  reduced when following aspiration precautions and using a modified diet consistency w/ FULL feeding support and Cues for follow through w/ strategies.  Pt also recently tested Positive for COVID+ per chart notes.   Pt  consumed trials of ice chip, purees, and thin liquids via Cup and Straw w/ delayed Cough noted x1 at end of all trials; pt was requiring Pinching of Straw which is difficult to time w/ trials. No other overt, clinical s/s of aspiration were noted w/ trials; no decline in vocal quality; no immediate cough, and no decline in respiratory status during/post trials. Oral phase was adequate for bolus management and oral clearing of the puree consistency boluses given. However, pt exhibited decreased awareness of the thin liquids trials via Cup/Straw w/ bolus loss d/t poor timing of labial closure and lingual control of bolus trials. Pt attempted to Hold Cup during drinking but required Mod-Max support. OM Exam was cursory but appeared [facility] w/ No unilateral weakness noted. Some confusion of OM tasks.         D/t pt's declined Medical and Cognitive status' w/ decreased attention to tasks, and his risk for aspiration, recommend a more dysphagia level 3(mech soft) w/ thin liquids via Cup or Pinched Straw; strict aspiration precautions including sitting UPRIGHT for any oral intake; reduce Distractions during meals and engage pt during po's at meal for self-feeding. Pills Whole vs Crushed in Puree for safer swallowing. Supervision at meals. MD/NSG updated. SLP Visit Diagnosis: Dysphagia, oropharyngeal phase (R13.12) (declined Cognitive and Medical status'; weakness)    Aspiration Risk  Mild aspiration risk;Risk for inadequate nutrition/hydration    Diet Recommendation  dysphagia level 3(mech soft) w/ thin liquids via Cup or Pinched Straw; strict aspiration precautions including sitting UPRIGHT for any oral intake; reduce Distractions during meals and engage pt during po's at meal for self-feeding. Supervision at meals.  Medication Administration: Whole meds with puree (vs need to Crush in puree)    Other  Recommendations Recommended Consults:  (GI following baseline; Dietician f/u; Palliative Care f/u for [facility].) Oral  Care Recommendations: Oral care BID;Oral care before and after PO;Staff/trained caregiver to provide oral care (Denture care) Other Recommendations:  (n/a)    Recommendations for follow up therapy are one component of a multi-disciplinary discharge planning process, led by the attending physician.  Recommendations may be updated based on patient status, additional functional criteria and insurance authorization.  Follow up Recommendations  (TBD)      Assistance Recommended at Discharge Frequent or constant Supervision/Assistance (weakness; Cognitive decline)  Functional Status Assessment Patient has had a recent decline in their functional status and demonstrates the ability to make significant improvements in function in a reasonable and predictable amount of time.  Frequency and Duration min 2x/week  1 week       Prognosis Prognosis for Safe Diet Advancement: Fair Barriers to Reach Goals: Time post onset;Severity of deficits;Cognitive deficits Barriers/Prognosis Comment: extended illness      Swallow Study   General Date of Onset: 08/11/21 HPI: Pt is a 87 y.o. male seen for evaluation of severe transaminitis with cholelithiasis and possible cholecystitis at the request of Dr. Nolberto Hanlon. Pt has a PMH of HTN, HLD, Hx of CVA, CKD Stage IIIb, COPD, CAD, Hx of CABG x3 in 2003, Hx of recent GI bleed 2/2 peptic ulcer disease treated with bipolar cautery 06/2021 by Dr. Virgina Jock. Pt presented to [facility] ED 11/6 for generalized weakness, falls at home, and reduced oral intake. Patient is non-English speaking and video interpretor  is used to help with exam. Patient lives at home with daughter and son-in-law. Patient's daughter, Daniel Bolton, provides most of the history. Upon presentation to the ED, labs were significant for acute on chronic kidney injury with serum creatinine 3.42 (baseline Cr 1.3 - 1.7), BUN 118, hemoglobin 9.6, hematocrit 29.7, hs-troponin 111, sodium 130, potassium 5.7, creatinine kinase  2617, ESR 93. He was also found to have significant transaminitis with AST 1049, ALT 494. His alk phos was 163 and tbili within normal limits at 0.9-0.7. Urinalysis was significant for proteinuria and positive myoglobin. He began treatment with IVF hydration for possible rhabdomyolysis. 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 He had RUQ US performed this morning showing large gallstone in neck of gallbladder with possible wall changes suggestive of cholecystitis and dilated common bile duct at 9.0 mm. General Surgery has been consulted. Daughter reports that her father has been complaining of abdominal pain over the past several days.   In the emergency department, he was found to have AKI with evidence of rhabdomyolysis. He also has a history of heart failure and chronic kidney disease.  On 07/31/21- patient presented for evaluation of Post COVID19 dyspnea w/ Pulmonolgy after testing Positive ~1 week earlier.   CXR at admit: Cardiomegaly, pulmonary vascular congestion and possible small left  pleural effusion. Type of Study: Bedside Swallow Evaluation Previous Swallow Assessment: none Diet Prior to this Study: Regular;Thin liquids Temperature Spikes Noted: No (wbc 11.5) Respiratory Status: Nasal cannula (1L) History of Recent Intubation: No Behavior/Cognition: Alert;Cooperative;Pleasant mood;Confused;Distractible;Requires cueing Oral Cavity Assessment: Within Functional Limits Oral Care Completed by SLP: Recent completion by staff Oral Cavity - Dentition: Edentulous (not wearing Dentures currently(present in room)) Vision:  (n/a) Self-Feeding Abilities: Total assist (shaky UEs/weak) Patient Positioning: Upright in bed (needed full positioning support; Family needed education on this positioning) Baseline Vocal Quality: Low vocal intensity (mumbled speech often) Volitional Cough: Strong Volitional Swallow: Unable to elicit    Oral/Motor/Sensory Function Overall Oral  Motor/Sensory Function: Within functional limits (appeared w/ general oral movements; confusion w/ following OM instructions)   Ice Chips Ice chips: Within functional limits Presentation: Spoon (fed; 1 trial) Other Comments: does not like cold   Thin Liquid Thin Liquid: Impaired Presentation: Cup;Straw (fully supported during feeding: ~6 trials via each) Oral Phase Impairments: Poor awareness of bolus;Reduced labial seal Oral Phase Functional Implications: Right anterior spillage;Left anterior spillage Pharyngeal  Phase Impairments: Suspected delayed Swallow;Cough - Delayed (Audible swallows at times) Other Comments: delayed cough x1    Nectar Thick Nectar Thick Liquid: Not tested   Honey Thick Honey Thick Liquid: Not tested   Puree Puree: Within functional limits Presentation: Spoon (fed; 4 trials)   Solid     Solid: Not tested Other Comments: did not have dentures in place        Orinda Kenner, [place], [employer] Speech Language Pathologist Rehab Services [phone] Taten Merrow 08/14/2021,5:40 PM

## 2021-08-15 ENCOUNTER — Inpatient Hospital Stay: Payer: Medicare HMO

## 2021-08-15 DIAGNOSIS — E7800 Pure hypercholesterolemia, unspecified: Secondary | ICD-10-CM | POA: Diagnosis not present

## 2021-08-15 DIAGNOSIS — M6282 Rhabdomyolysis: Secondary | ICD-10-CM

## 2021-08-15 DIAGNOSIS — R778 Other specified abnormalities of plasma proteins: Secondary | ICD-10-CM | POA: Diagnosis not present

## 2021-08-15 DIAGNOSIS — I10 Essential (primary) hypertension: Secondary | ICD-10-CM | POA: Diagnosis not present

## 2021-08-15 DIAGNOSIS — N179 Acute kidney failure, unspecified: Secondary | ICD-10-CM | POA: Diagnosis not present

## 2021-08-15 DIAGNOSIS — K8020 Calculus of gallbladder without cholecystitis without obstruction: Secondary | ICD-10-CM | POA: Diagnosis not present

## 2021-08-15 LAB — COMPREHENSIVE METABOLIC PANEL
ALT: 458 U/L — ABNORMAL HIGH (ref 0–44)
AST: 798 U/L — ABNORMAL HIGH (ref 15–41)
Albumin: 2.1 g/dL — ABNORMAL LOW (ref 3.5–5.0)
Alkaline Phosphatase: 143 U/L — ABNORMAL HIGH (ref 38–126)
Anion gap: 11 (ref 5–15)
BUN: 68 mg/dL — ABNORMAL HIGH (ref 8–23)
CO2: 17 mmol/L — ABNORMAL LOW (ref 22–32)
Calcium: 8.1 mg/dL — ABNORMAL LOW (ref 8.9–10.3)
Chloride: 107 mmol/L (ref 98–111)
Creatinine, Ser: 3.42 mg/dL — ABNORMAL HIGH (ref 0.61–1.24)
GFR, Estimated: 17 mL/min — ABNORMAL LOW (ref >60)
Glucose, Bld: 83 mg/dL (ref 70–99)
Potassium: 5.7 mmol/L — ABNORMAL HIGH (ref 3.5–5.1)
Sodium: 135 mmol/L (ref 135–145)
Total Bilirubin: 0.9 mg/dL (ref 0.3–1.2)
Total Protein: 5.5 g/dL — ABNORMAL LOW (ref 6.5–8.1)

## 2021-08-15 LAB — CK: Total CK: 18330 U/L — ABNORMAL HIGH (ref 49–397)

## 2021-08-15 MED ORDER — HEPARIN SODIUM (PORCINE) 5000 UNIT/ML IJ SOLN
5000.0000 [IU] | Freq: Three times a day (TID) | INTRAMUSCULAR | Status: DC
  Administered 2021-08-16 – 2021-08-24 (×23): 5000 [IU] via SUBCUTANEOUS
  Filled 2021-08-15 (×23): qty 1

## 2021-08-15 MED ORDER — SODIUM POLYSTYRENE SULFONATE 15 GM/60ML PO SUSP
45.0000 g | Freq: Once | ORAL | Status: DC
  Filled 2021-08-15: qty 180

## 2021-08-15 MED ORDER — PATIROMER SORBITEX CALCIUM 8.4 G PO PACK
16.8000 g | PACK | Freq: Every day | ORAL | Status: DC
  Administered 2021-08-15 – 2021-08-18 (×3): 16.8 g via ORAL
  Filled 2021-08-15 (×4): qty 2

## 2021-08-15 MED ORDER — LIDOCAINE 5 % EX PTCH
1.0000 | MEDICATED_PATCH | CUTANEOUS | Status: DC
  Administered 2021-08-15 – 2021-08-25 (×10): 1 via TRANSDERMAL
  Filled 2021-08-15 (×13): qty 1

## 2021-08-15 MED ORDER — SODIUM BICARBONATE 8.4 % IV SOLN
INTRAVENOUS | Status: DC
  Filled 2021-08-15 (×3): qty 1000
  Filled 2021-08-15: qty 150

## 2021-08-15 MED ORDER — TECHNETIUM TC 99M MEBROFENIN IV KIT
5.0000 | PACK | Freq: Once | INTRAVENOUS | Status: AC | PRN
  Administered 2021-08-15: 08:00:00 4.73 via INTRAVENOUS

## 2021-08-15 NOTE — Progress Notes (Signed)
[facility] Inpatient Progress Note  Subjective: Patient seen for follow up of RUQ pain, presumed cholecystitis. HIDA scan was negative for cystic duct obstruction and the gallbladder filled after IV morphine was given. HIDA negative. Small bowel filled with contrast as well suggesting no bile duct obstruction. Patient' granddaughter, Daniel [facility], is present and helps translate the patient's symptoms and concerns from [place] to [place]. Patient says that his pain is much improved on the antibiotics. No nausea or vomiting.   Objective: Vital signs in last 24 hours: Temp:  [97.6 F (36.4 C)-98.2 F (36.8 C)] 97.6 F (36.4 C) (11/10 1135) Pulse Rate:  [58-66] 63 (11/10 1135) Resp:  [17-20] 18 (11/10 1135) BP: (105-132)/(37-50) 118/50 (11/10 1135) SpO2:  [89 %-98 %] 89 % (11/10 1135) Blood pressure (!) 118/50, pulse 63, temperature 97.6 F (36.4 C), temperature source Oral, resp. rate 18, height 5\' 1"  (1.549 m), weight 59.9 kg, SpO2 (!) 89 %.    Intake/Output from previous day: 11/09 0701 - 11/10 0700 In: 1912.3 [P.O.:100; I.V.:1762.3; IV Piggyback:50] Out: 1350 [Urine:1350]  Intake/Output this shift: Total I/O In: -  Out: 820 [Urine:820]   Gen: NAD. Appears comfortable.  HEENT: [place]/AT. PERRLA. Normal external ear exam.  Chest: CTA, no wheezes.  CV: RR nl S1, S2. No gallops.  Abd: soft, nt, nd. BS+  Ext: no edema. Pulses 2+  Neuro: Alert and oriented. Judgement appears normal. Nonfocal.   Lab Results: Results for orders placed or performed during the hospital encounter of 08/11/21 (from the past 24 hour(s))  Comprehensive metabolic panel     Status: Abnormal   Collection Time: 08/15/21  4:23 AM  Result Value Ref Range   Sodium 135 135 - 145 mmol/L   Potassium 5.7 (H) 3.5 - 5.1 mmol/L   Chloride 107 98 - 111 mmol/L   CO2 17 (L) 22 - 32 mmol/L   Glucose, Bld 83 70 - 99 mg/dL   BUN 68 (H) 8 - 23 mg/dL   Creatinine, Ser 3.42 (H) 0.61 - 1.24 mg/dL    Calcium 8.1 (L) 8.9 - 10.3 mg/dL   Total Protein 5.5 (L) 6.5 - 8.1 g/dL   Albumin 2.1 (L) 3.5 - 5.0 g/dL   AST 798 (H) 15 - 41 U/L   ALT 458 (H) 0 - 44 U/L   Alkaline Phosphatase 143 (H) 38 - 126 U/L   Total Bilirubin 0.9 0.3 - 1.2 mg/dL   GFR, Estimated 17 (L) >60 mL/min   Anion gap 11 5 - 15  CK     Status: Abnormal   Collection Time: 08/15/21  4:23 AM  Result Value Ref Range   Total CK 18,330 (H) 49 - 397 U/L     Recent Labs    08/13/21 0526 08/14/21 0553  WBC 9.4 11.5*  HGB 9.0* 9.3*  HCT 27.3* 28.4*  PLT 196 223   BMET Recent Labs    08/13/21 0526 08/14/21 0553 08/15/21 0423  NA 134* 131* 135  K 5.2* 6.0* 5.7*  CL 105 103 107  CO2 20* 18* 17*  GLUCOSE 94 113* 83  BUN 118* 118* 68*  CREATININE 2.76* 3.01* 3.42*  CALCIUM 8.3* 8.3* 8.1*   LFT Recent Labs    08/15/21 0423  PROT 5.5*  ALBUMIN 2.1*  AST 798*  ALT 458*  ALKPHOS 143*  BILITOT 0.9   PT/INR No results for input(s): LABPROT, INR in the last 72 hours. Hepatitis Panel No results for input(s): HEPBSAG, HCVAB, HEPAIGM, HEPBIGM in the last 72  hours. C-Diff No results for input(s): CDIFFTOX in the last 72 hours. No results for input(s): CDIFFPCR in the last 72 hours.   Studies/Results: NM Hepatobiliary Liver Func  Result Date: 08/15/2021 CLINICAL DATA:  Abdominal pain.  Question cholecystitis. EXAM: NUCLEAR MEDICINE HEPATOBILIARY IMAGING TECHNIQUE: Sequential images of the abdomen were obtained. Gallbladder activity was not confidently identified after 45 minutes. Therefore, the patient was given 2 mg of IV morphine and follow-up imaging was obtained. RADIOPHARMACEUTICALS:  4.73 mCi Tc-26m  Choletec IV COMPARISON:  Ultrasound 08/14/2021 FINDINGS: Prompt uptake and biliary excretion of activity by the liver. After 45 minutes, there was activity in the small bowel but no definite gallbladder uptake. Following the administration of IV morphine, there was uptake in the gallbladder. Gallbladder ejection  fraction was not calculated. IMPRESSION: Gallbladder uptake following the administration of IV morphine. Cystic duct is patent. Electronically Signed   By: Markus Daft M.D.   On: 08/15/2021 11:15   US RENAL  Result Date: 08/14/2021 CLINICAL DATA:  Renal failure EXAM: RENAL / URINARY TRACT ULTRASOUND COMPLETE COMPARISON:  Renal ultrasound 08/12/2021 FINDINGS: Right Kidney: Renal measurements: 8.8 x 4.2 x 4.4 cm = volume: 85 mL. Increased echogenicity with no hydronephrosis. Left Kidney: Renal measurements: 8.3 x 3.8 x 3.5 cm = volume: 57 mL. Mildly increased echogenicity. No hydronephrosis. Bladder: Appears normal for degree of bladder distention. Other: Prostate gland is enlarged with nodular protrusion into the base of the urinary bladder. Right pleural effusion. IMPRESSION: 1. Increased echogenicity of the kidneys suggesting medical renal disease. No hydronephrosis. 2. Prostatomegaly. 3. Right pleural effusion. Electronically Signed   By: Ofilia Neas M.D.   On: 08/14/2021 12:07   US Abdomen Limited RUQ (LIVER/GB)  Result Date: 08/14/2021 CLINICAL DATA:  Elevated liver function studies. EXAM: ULTRASOUND ABDOMEN LIMITED RIGHT UPPER QUADRANT COMPARISON:  CT scan 06/04/2021 FINDINGS: Gallbladder: Echogenic shadowing gallstones are noted in the gallbladder. The largest calculus measures 1.3 cm. There is also mild gallbladder wall thickening and pericholecystic fluid. Common bile duct: Diameter: 9.0 mm Liver: Normal echogenicity without focal lesion or biliary dilatation. Portal vein is patent on color Doppler imaging with normal direction of blood flow towards the liver. Other: Large right pleural effusion is noted. IMPRESSION: 1. Cholelithiasis and findings suspicious for acute cholecystitis. 2. Dilated common bile duct could suggest an obstructing common bile duct stone. MRI abdomen/MRCP may be helpful for further evaluation. 3. Right pleural effusion. Electronically Signed   By: Marijo Sanes M.D.   On:  08/14/2021 09:43    Scheduled Inpatient Medications:    feeding supplement  237 mL Oral BID BM   [START ON 08/16/2021] heparin injection (subcutaneous)  5,000 Units Subcutaneous Q8H   lidocaine  1 patch Transdermal Q24H   pantoprazole  40 mg Oral BID   patiromer  16.8 g Oral Daily    Continuous Inpatient Infusions:    piperacillin-tazobactam (ZOSYN)  IV 2.25 g (08/15/21 1105)   sodium bicarbonate 150 mEq in D5W infusion 75 mL/hr at 08/15/21 1241    PRN Inpatient Medications:  acetaminophen **OR** acetaminophen, magnesium hydroxide, morphine injection, ondansetron **OR** ondansetron (ZOFRAN) IV, oxyCODONE, traZODone  Miscellaneous: see list  Assessment:  RUQ pain/cholelithiasis - Given negative HIDA, surgery will defer surgery and/or cholecystostomy tube drainage. Given dilated bile duct and elevated liver enzymes, will order an MRCP to rule out retained common bile duct stone. If positive, may need ERCP tomorrow. Case discussed with DR. Wohl, who is available tomorrow to perform ERCP if clinically deemed necessary.  Plan:  Observe and await MRCP. As above. All question were answered. Grand-daughter, Daniel [facility], would like to be contacted with results and treatment plan when known.  Chris Cripps K. Alice Reichert, M.D. 08/15/2021, 2:38 PM

## 2021-08-15 NOTE — Progress Notes (Signed)
OT Cancellation Note  Patient Details Name: [facility] MRN: [id number] DOB: 03/19/1934   Cancelled Treatment:    Reason Eval/Treat Not Completed: Medical issues which prohibited therapy. Although trending down, pt's K+ continues to be outside of parameters for safe therapeutic intervention. OT to hold and re-attempt at next available time.   Darleen Crocker, MS, OTR/L , CBIS ascom [phone]  08/15/21, 1:10 PM

## 2021-08-15 NOTE — Progress Notes (Signed)
PROGRESS NOTE    Daniel Bolton  ALP:[id number] DOB: [birth year] DOA: 08/11/2021 PCP: [name], MD   Chief Complaint  Patient presents with   Weakness    Brief Narrative: 87 y.o. Hispanic male with medical history significant for essential hypertension, CAD status post CABG, CHF, chronic kidney disease and CVA, who presented to the emergency room with acute onset   generalized weakness since Friday with diminished appetite and oral intake.  He's been found to have AKI as well as generalized weakness.  Being treated for rhabdo with IVF (caution given hx HF).  Other potential causes for his weakness being evaluated as noted below.    11/9 family at bedside. No overnight issues. 11/10 reports no abd pain/n/v. HIDA negative  Assessment & Plan:   Active Problems:   Moderate major depression, single episode (HCC)   Coronary artery disease   AKI (acute kidney injury) ([place])   Normochromic normocytic anemia   Essential hypertension   Hyponatremia   Acute on chronic combined systolic and diastolic CHF (congestive heart failure) (HCC)   HLD (hyperlipidemia)   Hyperkalemia   AAA (abdominal aortic aneurysm)   Elevated troponin  Bilateral Lower Extremity Weakness  Generalized Weakness Unclear etiology, before Friday, up and walking with walker - after Friday, wasn't able to  Granddaughter noted incontinence of bowel as well as numbness in legs as well Weakness could be from neurologic causes vs rhabdo/myositis 11/9 mri with cervical spinal canal stenosis and neural foraminal narrowing-see reports. 11/10 continue lidocaine patch F/u with pcp as outpt PT/OT  AKI  Hyperkalemia UA with hg, but no RBC Elevated LFT's Suspect rhabdo (urine myoglobin positive) 11/9 renal US suggesting med-renal dz. No hydron. Prostatomegaly. Continue ivf -caution with h/xo chf Ck rising K 6.0, ekg this am no peaked T waves Give kayxelate 30gm.  Give 10 units insulin 11/10 creatinine  worsening, K down mildly Will give another dose of kayxelate Nephrology following Renal ultrasound without hydronephrosis please see full report No acute need for dialysis currently IV fluids changed to sodium bicarb at 75 MLS per hour    Rhabdomyolysis UA Heme + and only 0-5 RBC's He's on amlodipine/simvastatin -> increases risk of rhabdo (sounds like he's been on this combination for a while -> regimen should be adjusted at discharge) - continue to hold  Also with weakness, could have been in one position for a while CK rising, continuing IVF  Consider possibility of inflammatory myositis? as well, though will monitor for now with treatment for rhabdo, consider discussion with [facility] rheumatology if worsening/not improving - unclear sig of inflammatory markers at this time, will follow TSH elevated mildly, follow free T4 11/10-ck trended significantly higher, 18000. Tea colored urine CRP elevated ? Myositis...will consult rheumatology Nephrology following Continue IVF , have to be cautious as he appears mildly volume overloaded  Elevated LFT's 2/2 rhabdo  Negative acute hepatitis panel 11/9 abdominal ultrasound with cholelithiasis and findings suspicious for acute cholecystitis.  Dilated common bile duct suggestive of obstructing common bile duct stone.  MRI abdomen and MRCP can be helpful. Right pleural effusion 11/10 HIDA negative. GI/Surgery following Avoid nephrotoxic meds Obtain MRCP   Chronic combined systolic and diastolic HF EF 35-32%, global hypokinesis 04/2021, grade II diastolic dysfunction, moderately reduced RVSF  Mildly volume overloaded Needs ivf for Rhabdo as above, AKI worsening Will d/w nephrology, may need intermittent lasix       Lumbar Stenosis with Radiculopathy Lumbar imaging obtained with concern of LE weakness + incontinence of stool  C/T spine imaging obtained with concern for UE pain, weakness as well - follow with neurology NSGY rec considering  decompression when medically stable Preoperative clearance per cardiology - high risk surgical candidate (see 11/8 note)    Elevated D dimer  RUE Swelling No evidence of DVT in RUE VQ scan pending -> indeterminate LE [place] - negative for DVT Low suspicion for PE based on his overall picture - will wean O2, may need additional w/u if difficulty weaning   Right Pleural Effusion Will need follow up imaging  CAD with hx CABG  Elevated Troponin Aspirin, plavix still being held in setting of GI bleed (was supposed to hold until reeval) No CP, appreciate cardiology assistance - suspect demand Appreciate cardiology assistance  Sinus Bradycardia Currently metoprolol on hold HR as low as 40's documented outpatient  Appreciate cardiology recs (see 11/8 note, signing off) Concern that this may contribute to weakness, appreciate cardiology assistance  Hypertension Dc amlodipine as bp on nml-low side Holding benicar and HCTZ  HLD Holding simvastatin given elevated CK and on amlodipine, if resuming statin at discharge should resume alternative statin or simvastatin at lower dose (after CK, LFT's improved)  GERD  Hx GI Bleed  Reflux Esophagitis  Gastric Ulcer  Erosive Gastropathy  Duodenal Ulcers PPI BID  DVT prophylaxis: lovenox Code Status: full Family Communication: Curator, son Disposition:   Status is: Inpatient  Remains inpatient appropriate because: need for further workup       Consultants:  Renal cardiology  Procedures No DVT in RUE  Antimicrobials:  Anti-infectives (From admission, onward)    Start     Dose/Rate Route Frequency Ordered Stop   08/14/21 1800  piperacillin-tazobactam (ZOSYN) IVPB 2.25 g        2.25 g 100 mL/hr over 30 Minutes Intravenous Every 6 hours 08/14/21 1648            Subjective: Minimal sob, no cp, no abd pain   Objective: Vitals:   08/14/21 2048 08/14/21 2325 08/15/21 0542 08/15/21 0733  BP: (!) 132/49 (!) 105/37  (!) 116/43 (!) 127/48  Pulse: 66 (!) 58 61 62  Resp: 17 18 18 20   Temp: 97.9 F (36.6 C)  98.2 F (36.8 C) 98.2 F (36.8 C)  TempSrc: Oral   Oral  SpO2: 96% 94% 98% 94%  Weight:      Height:        Intake/Output Summary (Last 24 hours) at 08/15/2021 0812 Last data filed at 08/15/2021 0715 Gross per 24 hour  Intake 1912.27 ml  Output 2170 ml  Net -257.73 ml   Filed Weights   08/11/21 1921  Weight: 59.9 kg    Examination: Appears tired,calm Scattered crackles at bases, no wheezing Reg s1/s2 no gallop Soft benign +bs No edema Awake and alert Mood and affect appropriate in current setting   Data Reviewed: I have personally reviewed following labs and imaging studies  CBC: Recent Labs  Lab 08/11/21 1930 08/12/21 0648 08/13/21 0526 08/14/21 0553  WBC 8.7 7.7 9.4 11.5*  NEUTROABS  --   --  7.9* 9.6*  HGB 9.6* 9.5* 9.0* 9.3*  HCT 29.7* 29.0* 27.3* 28.4*  MCV 84.6 85.8 85.6 84.5  PLT 190 190 196 662    Basic Metabolic Panel: Recent Labs  Lab 08/11/21 1930 08/12/21 0648 08/13/21 0526 08/14/21 0553 08/15/21 0423  NA 130* 134* 134* 131* 135  K 5.7* 5.0 5.2* 6.0* 5.7*  CL 97* 102 105 103 107  CO2 23 22 20* 18* 17*  GLUCOSE 100* 79 94 113* 83  BUN 118* 124* 118* 118* 68*  CREATININE 3.42* 3.26* 2.76* 3.01* 3.42*  CALCIUM 8.8* 8.8* 8.3* 8.3* 8.1*  MG  --   --  2.3 2.4  --   PHOS  --   --  6.7* 7.5*  --     GFR: Estimated Creatinine Clearance: 11.7 mL/min (A) (by C-G formula based on SCr of 3.42 mg/dL (H)).  Liver Function Tests: Recent Labs  Lab 08/12/21 0006 08/13/21 0526 08/14/21 0553 08/15/21 0423  AST 1,049* 906* 994* 798*  ALT 494* 448* 513* 458*  ALKPHOS 163* 167* 183* 143*  BILITOT 0.9 0.7 0.7 0.9  PROT 6.7 5.7* 6.3* 5.5*  ALBUMIN 2.6* 2.1* 2.4* 2.1*    CBG: Recent Labs  Lab 08/11/21 1938  GLUCAP 95     Recent Results (from the past 240 hour(s))  Resp Panel by RT-PCR (Flu A&B, Covid) Nasopharyngeal Swab     Status: None    Collection Time: 08/12/21 12:03 AM   Specimen: Nasopharyngeal Swab; Nasopharyngeal(NP) swabs in vial transport medium  Result Value Ref Range Status   SARS Coronavirus 2 by RT PCR NEGATIVE NEGATIVE Final    Comment: (NOTE) SARS-CoV-2 target nucleic acids are NOT DETECTED.  The SARS-CoV-2 RNA is generally detectable in upper respiratory specimens during the acute phase of infection. The lowest concentration of SARS-CoV-2 viral copies this assay can detect is 138 copies/mL. A negative result does not preclude SARS-Cov-2 infection and should not be used as the sole basis for treatment or other patient management decisions. A negative result may occur with  improper specimen collection/handling, submission of specimen other than nasopharyngeal swab, presence of viral mutation(s) within the areas targeted by this assay, and inadequate number of viral copies(<138 copies/mL). A negative result must be combined with clinical observations, patient history, and epidemiological information. The expected result is Negative.  Fact Sheet for Patients:  EntrepreneurPulse.com.au  Fact Sheet for Healthcare Providers:  IncredibleEmployment.be  This test is no t yet approved or cleared by the [place] FDA and  has been authorized for detection and/or diagnosis of SARS-CoV-2 by FDA under an Emergency Use Authorization (EUA). This EUA will remain  in effect (meaning this test can be used) for the duration of the COVID-19 declaration under Section 564(b)(1) of the Act, 21 U.S.C.section 360bbb-3(b)(1), unless the authorization is terminated  or revoked sooner.       Influenza A by PCR NEGATIVE NEGATIVE Final   Influenza B by PCR NEGATIVE NEGATIVE Final    Comment: (NOTE) The Xpert Xpress SARS-CoV-2/FLU/RSV plus assay is intended as an aid in the diagnosis of influenza from Nasopharyngeal swab specimens and should not be used as a sole basis for treatment.  Nasal washings and aspirates are unacceptable for Xpert Xpress SARS-CoV-2/FLU/RSV testing.  Fact Sheet for Patients: EntrepreneurPulse.com.au  Fact Sheet for Healthcare Providers: IncredibleEmployment.be  This test is not yet approved or cleared by the [place] FDA and has been authorized for detection and/or diagnosis of SARS-CoV-2 by FDA under an Emergency Use Authorization (EUA). This EUA will remain in effect (meaning this test can be used) for the duration of the COVID-19 declaration under Section 564(b)(1) of the Act, 21 U.S.C. section 360bbb-3(b)(1), unless the authorization is terminated or revoked.  Performed at [facility], [address] [place], [place] [address]          Radiology Studies: MR CERVICAL SPINE WO CONTRAST  Addendum Date: 08/13/2021   ADDENDUM REPORT: 08/13/2021 15:23 IMPRESSION:  6.  Moderate right and trace left pleural effusions. Electronically Signed   By: Merilyn Baba M.D.   On: 08/13/2021 15:23   Result Date: 08/13/2021 CLINICAL DATA:  Upper extremity weakness, incontinence, leg numbness EXAM: MRI CERVICAL AND THORACIC SPINE WITHOUT CONTRAST TECHNIQUE: Multiplanar and multiecho pulse sequences of the cervical spine, to include the craniocervical junction and cervicothoracic junction, and the thoracic spine, were obtained without intravenous contrast. COMPARISON:  No prior MRI, correlation is made with CT cervical spine 06/14/2021 FINDINGS: MRI CERVICAL SPINE FINDINGS Alignment: Unchanged trace anterolisthesis C4 on C5 and C5 on C6. Vertebrae: No acute fracture or suspicious osseous lesion. Congenitally short pedicles, which narrow the AP diameter of the spinal canal. Cord: Normal signal and morphology. Posterior Fossa, vertebral arteries, paraspinal tissues: Negative. Disc levels: C2-C3: No significant disc bulge. Facet arthropathy and right-greater-than-left uncovertebral hypertrophy. No spinal canal  stenosis. Mild right neural foraminal narrowing. C3-C4: Central disc extrusion with 4 mm of caudal migration, which indents the ventral spinal cord. Facet arthropathy. Mild to moderate bilateral neural foraminal narrowing. Moderate spinal canal stenosis. C4-C5: Disc bulge with superimposed central protrusion, which indents the ventral cord. Ligamentum flavum hypertrophy. Left greater than right facet and uncovertebral hypertrophy. Moderate to severe spinal canal stenosis. Moderate bilateral neural foraminal narrowing. C5-C6: Mild anterolisthesis and disc unroofing. Facet arthropathy. Mild bilateral neural foraminal narrowing. No spinal canal stenosis. C6-C7: Minimal disc bulge. Left-greater-than-right uncovertebral hypertrophy. Facet arthropathy. No spinal canal stenosis. Moderate bilateral neural foraminal narrowing. C7-T1: No significant disc bulge. Facet arthropathy. No spinal canal stenosis. Mild bilateral neural foraminal narrowing. MRI THORACIC SPINE FINDINGS Alignment:  No listhesis. Vertebrae: No acute fracture or suspicious osseous lesion. Cord:  Normal in signal and morphology. Paraspinal and other soft tissues: Moderate right and trace left pleural effusion. Disc levels: Mild degenerative changes without significant spinal canal stenosis or high-grade neural foraminal narrowing. IMPRESSION: 1. No evidence of cord signal abnormality in the cervical or thoracic spine. 2. C4-C5 moderate to severe spinal canal stenosis and moderate bilateral neural foraminal narrowing. 3. C3-C4 moderate spinal canal stenosis and mild-to-moderate bilateral neural foraminal narrowing. 4. C6-C7 moderate bilateral neural foraminal narrowing. 5. No significant spinal canal stenosis or neural foraminal narrowing in the thoracic spine. Electronically Signed: By: Merilyn Baba M.D. On: 08/13/2021 15:19   MR THORACIC SPINE WO CONTRAST  Addendum Date: 08/13/2021   ADDENDUM REPORT: 08/13/2021 15:23 IMPRESSION: 6.  Moderate right and  trace left pleural effusions. Electronically Signed   By: Merilyn Baba M.D.   On: 08/13/2021 15:23   Result Date: 08/13/2021 CLINICAL DATA:  Upper extremity weakness, incontinence, leg numbness EXAM: MRI CERVICAL AND THORACIC SPINE WITHOUT CONTRAST TECHNIQUE: Multiplanar and multiecho pulse sequences of the cervical spine, to include the craniocervical junction and cervicothoracic junction, and the thoracic spine, were obtained without intravenous contrast. COMPARISON:  No prior MRI, correlation is made with CT cervical spine 06/14/2021 FINDINGS: MRI CERVICAL SPINE FINDINGS Alignment: Unchanged trace anterolisthesis C4 on C5 and C5 on C6. Vertebrae: No acute fracture or suspicious osseous lesion. Congenitally short pedicles, which narrow the AP diameter of the spinal canal. Cord: Normal signal and morphology. Posterior Fossa, vertebral arteries, paraspinal tissues: Negative. Disc levels: C2-C3: No significant disc bulge. Facet arthropathy and right-greater-than-left uncovertebral hypertrophy. No spinal canal stenosis. Mild right neural foraminal narrowing. C3-C4: Central disc extrusion with 4 mm of caudal migration, which indents the ventral spinal cord. Facet arthropathy. Mild to moderate bilateral neural foraminal narrowing. Moderate spinal canal stenosis. C4-C5: Disc bulge with superimposed central  protrusion, which indents the ventral cord. Ligamentum flavum hypertrophy. Left greater than right facet and uncovertebral hypertrophy. Moderate to severe spinal canal stenosis. Moderate bilateral neural foraminal narrowing. C5-C6: Mild anterolisthesis and disc unroofing. Facet arthropathy. Mild bilateral neural foraminal narrowing. No spinal canal stenosis. C6-C7: Minimal disc bulge. Left-greater-than-right uncovertebral hypertrophy. Facet arthropathy. No spinal canal stenosis. Moderate bilateral neural foraminal narrowing. C7-T1: No significant disc bulge. Facet arthropathy. No spinal canal stenosis. Mild  bilateral neural foraminal narrowing. MRI THORACIC SPINE FINDINGS Alignment:  No listhesis. Vertebrae: No acute fracture or suspicious osseous lesion. Cord:  Normal in signal and morphology. Paraspinal and other soft tissues: Moderate right and trace left pleural effusion. Disc levels: Mild degenerative changes without significant spinal canal stenosis or high-grade neural foraminal narrowing. IMPRESSION: 1. No evidence of cord signal abnormality in the cervical or thoracic spine. 2. C4-C5 moderate to severe spinal canal stenosis and moderate bilateral neural foraminal narrowing. 3. C3-C4 moderate spinal canal stenosis and mild-to-moderate bilateral neural foraminal narrowing. 4. C6-C7 moderate bilateral neural foraminal narrowing. 5. No significant spinal canal stenosis or neural foraminal narrowing in the thoracic spine. Electronically Signed: By: Merilyn Baba M.D. On: 08/13/2021 15:19   US RENAL  Result Date: 08/14/2021 CLINICAL DATA:  Renal failure EXAM: RENAL / URINARY TRACT ULTRASOUND COMPLETE COMPARISON:  Renal ultrasound 08/12/2021 FINDINGS: Right Kidney: Renal measurements: 8.8 x 4.2 x 4.4 cm = volume: 85 mL. Increased echogenicity with no hydronephrosis. Left Kidney: Renal measurements: 8.3 x 3.8 x 3.5 cm = volume: 57 mL. Mildly increased echogenicity. No hydronephrosis. Bladder: Appears normal for degree of bladder distention. Other: Prostate gland is enlarged with nodular protrusion into the base of the urinary bladder. Right pleural effusion. IMPRESSION: 1. Increased echogenicity of the kidneys suggesting medical renal disease. No hydronephrosis. 2. Prostatomegaly. 3. Right pleural effusion. Electronically Signed   By: Ofilia Neas M.D.   On: 08/14/2021 12:07   US Abdomen Limited RUQ (LIVER/GB)  Result Date: 08/14/2021 CLINICAL DATA:  Elevated liver function studies. EXAM: ULTRASOUND ABDOMEN LIMITED RIGHT UPPER QUADRANT COMPARISON:  CT scan 06/04/2021 FINDINGS: Gallbladder: Echogenic  shadowing gallstones are noted in the gallbladder. The largest calculus measures 1.3 cm. There is also mild gallbladder wall thickening and pericholecystic fluid. Common bile duct: Diameter: 9.0 mm Liver: Normal echogenicity without focal lesion or biliary dilatation. Portal vein is patent on color Doppler imaging with normal direction of blood flow towards the liver. Other: Large right pleural effusion is noted. IMPRESSION: 1. Cholelithiasis and findings suspicious for acute cholecystitis. 2. Dilated common bile duct could suggest an obstructing common bile duct stone. MRI abdomen/MRCP may be helpful for further evaluation. 3. Right pleural effusion. Electronically Signed   By: Marijo Sanes M.D.   On: 08/14/2021 09:43        Scheduled Meds:  enoxaparin (LOVENOX) injection  30 mg Subcutaneous Q24H   feeding supplement  237 mL Oral BID BM   pantoprazole  40 mg Oral BID   sodium polystyrene  45 g Oral Once   Continuous Infusions:  sodium chloride 75 mL/hr at 08/14/21 2253   piperacillin-tazobactam (ZOSYN)  IV 2.25 g (08/15/21 0421)     LOS: 3 days    Time spent: 35 minutes with more than 50% on COC    Nolberto Hanlon, MD [facility]   To contact the attending provider between 7A-7P or the covering provider during after hours 7P-7A, please log into the web site [email or web address] and access using universal [facility] password for that web site. If you  do not have the password, please call the hospital operator.  08/15/2021, 8:12 AM

## 2021-08-15 NOTE — Progress Notes (Signed)
Speech Language Pathology Treatment: Dysphagia (education w/ Granddaughter)  Patient Details Name: Daniel Bolton MRN: [id number] DOB: [birth year] Today's Date: 08/15/2021 Time: 6440-3474 SLP Time Calculation (min) (ACUTE ONLY): 40 min  Assessment / Plan / Recommendation Clinical Impression  Met w/ pt and Granddaughter (who speaks English) during this session today for education on Aspiration Precautions; pt is Spanish-speaking. He was able to engage intermittently w/ his granddaughter but often had eyes closed and rested in bed. Pt does require MOD+ cues intermittently for follow through. Discussed w/ Granddaughter the concern for Dysphagia and consequences such as pneumonia, pulmonary issues, increased illness. She gave verbal understanding of information. Granddaughter stated pt had consumed some Ensure and sips of other liquids earlier -- she denied any overt coughing or choking w/ intake. Much Time was spent on Education of Aspiration Precautions to include sitting Fully Upright for all oral intake, Small Single sips Slowly, and having pt Help Hold Cup to drink in order to engage pt Cognitively in the task. Discussed food items and consistencies, food prep, condiments to moisten foods, and supplement such as Magic Cup ice cream. Granddaughter gave pt instructions on this information focusing on the Aspiration Precautions. Encouraged family to bring foods from home pt might enjoy also.  Recommend a dysphagia level 3(mech soft) w/ thin liquids via Cup or Pinched Straw; strict aspiration precautions including sitting UPRIGHT for any oral intake; reduce Distractions during meals and engage pt during po's at meal for self-feeding. Pills Whole vs Crushed in Puree for safer swallowing. Supervision at meals. ST services will f/u next 1-2 days. MD/NSG updated.       HPI HPI: Pt is a 87 y.o. male seen for evaluation of severe transaminitis with cholelithiasis and possible cholecystitis at the request  of Dr. Nolberto Hanlon. Pt has a PMH of HTN, HLD, Hx of CVA, CKD Stage IIIb, COPD, CAD, Hx of CABG x3 in 2003, Hx of recent GI bleed 2/2 peptic ulcer disease treated with bipolar cautery 06/2021 by Dr. Virgina Jock. Pt presented to the [facility] ED 11/6 for generalized weakness, falls at home, and reduced oral intake. Patient is non-English speaking and video interpretor is used to help with exam. Patient lives at home with daughter and son-in-law. Patient's daughter, [name], provides most of the history. Upon presentation to the ED, labs were significant for acute on chronic kidney injury with serum creatinine 3.42 (baseline Cr 1.3 - 1.7), BUN 118, hemoglobin 9.6, hematocrit 29.7, hs-troponin 111, sodium 130, potassium 5.7, creatinine kinase 2617, ESR 93. He was also found to have significant transaminitis with AST 1049, ALT 494. His alk phos was 163 and tbili within normal limits at 0.9-0.7. Urinalysis was significant for proteinuria and positive myoglobin. He began treatment with IVF hydration for possible rhabdomyolysis. 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 He had RUQ US performed this morning showing large gallstone in neck of gallbladder with possible wall changes suggestive of cholecystitis and dilated common bile duct at 9.0 mm. General Surgery has been consulted. Daughter reports that her father has been complaining of abdominal pain over the past several days.   In the emergency department, he was found to have AKI with evidence of rhabdomyolysis. He also has a history of heart failure and chronic kidney disease.  On 07/31/21- patient presented for evaluation of Post COVID19 dyspnea w/ Pulmonolgy after testing Positive ~1 week earlier.   CXR at admit: Cardiomegaly, pulmonary vascular congestion and possible small left  pleural effusion.  SLP Plan  Continue with current plan of care      Recommendations for follow up therapy are one component of a multi-disciplinary discharge  planning process, led by the attending physician.  Recommendations may be updated based on patient status, additional functional criteria and insurance authorization.    Recommendations  Diet recommendations: Dysphagia 3 (mechanical soft);Thin liquid Liquids provided via: Cup;Straw (monitor) Medication Administration: Whole meds with puree (vs need to CRUSH in puree) Supervision: Patient able to self feed;Staff to assist with self feeding;Full supervision/cueing for compensatory strategies Compensations: Minimize environmental distractions;Slow rate;Small sips/bites;Lingual sweep for clearance of pocketing;Multiple dry swallows after each bite/sip;Follow solids with liquid (Pt to Hold Cup) Postural Changes and/or Swallow Maneuvers: Out of bed for meals;Seated upright 90 degrees;Upright 30-60 min after meal                General recommendations:  (Dietician f/u; Palliative Care f/u for [place]) Oral Care Recommendations: Oral care BID;Oral care before and after PO;Staff/trained caregiver to provide oral care (Denture care) Follow Up Recommendations:  (TBD) Assistance recommended at discharge: Frequent or constant Supervision/Assistance (weakness; Cognitive decline) SLP Visit Diagnosis: Dysphagia, oropharyngeal phase (R13.12) Plan: Continue with current plan of care       [place], [place], [place] Pathologist Rehab Services [phone] [facility]  08/15/2021, 3:38 PM

## 2021-08-15 NOTE — Progress Notes (Signed)
PT Cancellation Note  Patient Details Name: Daniel Bolton MRN: [id number] DOB: 1933-12-05   Cancelled Treatment:    Reason Eval/Treat Not Completed: Medical issues which prohibited therapy (Per chart, K+: 5.7 outside of safe range for PT services.) WIll resume services at later date/time as appropriate.   12:46 PM, 08/15/21 Etta Grandchild, PT, DPT Physical Therapist - [facility]  [phone] ([place])     [place] 08/15/2021, 12:45 PM

## 2021-08-15 NOTE — Progress Notes (Addendum)
[facility] SURGICAL PROGRESS NOTE (cpt [phone])  Hospital Day(s): 3.   Interval History: Patient seen and examined, no acute events or new complaints overnight. Patient sleeping with daughter at bedside. Patient does not provide any history. Renal function worse this morning; sCr - 3.42: UO - 1.3L + unmeasured. Hyperkalemia to 5.7. AST and ALT improving; down to 798 and 458. Bilirubin remains normal at 0.9. CK elevated to >13,000. He did undergo HIDA this morning which was negative.   Review of Systems:  Unable to reliably preform  Vital signs in last 24 hours: [min-max] current  Temp:  [97.8 F (36.6 C)-98.2 F (36.8 C)] 98.2 F (36.8 C) (11/10 0733) Pulse Rate:  [58-66] 62 (11/10 0733) Resp:  [16-20] 20 (11/10 0733) BP: (105-140)/(37-51) 127/48 (11/10 0733) SpO2:  [89 %-98 %] 94 % (11/10 0733)     Height: 5\' 1"  (154.9 cm) Weight: 59.9 kg BMI (Calculated): 24.95   Intake/Output last 2 shifts:  11/09 0701 - 11/10 0700 In: 1912.3 [P.O.:100; I.V.:1762.3; IV Piggyback:50] Out: 1350 [Urine:1350]   Physical Exam:  Constitutional: sleeping, NAD HENT: normocephalic without obvious abnormality  Eyes: PERRL, EOM's grossly intact and symmetric  Respiratory: breathing non-labored at rest  Cardiovascular: regular rate and sinus rhythm  Gastrointestinal: soft, no appreciable tenderness, non-distended, no rebound/guarding   Labs:  CBC Latest Ref Rng & Units 08/14/2021 08/13/2021 08/12/2021  WBC 4.0 - 10.5 K/uL 11.5(H) 9.4 7.7  Hemoglobin 13.0 - 17.0 g/dL 9.3(L) 9.0(L) 9.5(L)  Hematocrit 39.0 - 52.0 % 28.4(L) 27.3(L) 29.0(L)  Platelets 150 - 400 K/uL 223 196 190   CMP Latest Ref Rng & Units 08/15/2021 08/14/2021 08/13/2021  Glucose 70 - 99 mg/dL 83 113(H) 94  BUN 8 - 23 mg/dL 68(H) 118(H) 118(H)  Creatinine 0.61 - 1.24 mg/dL 3.42(H) 3.01(H) 2.76(H)  Sodium 135 - 145 mmol/L 135 131(L) 134(L)  Potassium 3.5 - 5.1 mmol/L 5.7(H) 6.0(H) 5.2(H)  Chloride 98 - 111 mmol/L 107 103  105  CO2 22 - 32 mmol/L 17(L) 18(L) 20(L)  Calcium 8.9 - 10.3 mg/dL 8.1(L) 8.3(L) 8.3(L)  Total Protein 6.5 - 8.1 g/dL 5.5(L) 6.3(L) 5.7(L)  Total Bilirubin 0.3 - 1.2 mg/dL 0.9 0.7 0.7  Alkaline Phos 38 - 126 U/L 143(H) 183(H) 167(H)  AST 15 - 41 U/L 798(H) 994(H) 906(H)  ALT 0 - 44 U/L 458(H) 513(H) 448(H)     Imaging studies:   HIDA (08/15/2021) personally reviewed which showed gallbladder filling after IV morphine, and radiologist report reviewed below:  IMPRESSION: Gallbladder uptake following the administration of IV morphine. Cystic duct is patent.   Assessment/Plan: (ICD-10's: K51.20) 87 y.o. male presenting with weakness and falls at home found to have acute on chronic kidney injury with likely rhabdomyolysis incidentally found to have significant transaminitis with cholelithiasis, cholecystitis unlikely given negative HIDA               - HIDA scan negative, unlikely to have cholecystitis and I have low clinical suspicion that his gallbladder is contributing to his current condition. No indication for surgery nor percutaneous cholecystostomy tube  - Okay to resume diet              - Appreciate GI assistance             - Monitor abdominal examination - Pain control prn (will need to hold narcotic pain medications for HIDA); antiemetics prn - Monitor LFTs  - Monitor CK; significant elevation             -  Further management per primary service   - Nothing further from surgical perspective; we can follow peripherally. Will be readily available if needed    All of the above findings and recommendations were discussed with the patient and his family (daughter at bedside helps translate), and all of patient's family's questions were answered to their expressed satisfaction.  -- Edison Simon, PA-C [facility] 08/15/2021, 7:52 AM [phone] M-F: 7am - 4pm

## 2021-08-15 NOTE — Progress Notes (Addendum)
[facility]  ROUNDING NOTE   Subjective:   Daniel Bolton is a 87 year old Hispanic male with a past medical history including CAD with CABG, diastolic CHF, hypertension, CVA, and chronic kidney disease stage IIIa.  Patient presents to the emergency room with complaints of generalized weakness and increased swelling.  Patient will be admitted for Shortness of breath [R06.02] Hyperkalemia [E87.5] Positive D dimer [R79.89] Elevated troponin [R77.8] Swelling of arm [M79.89] Generalized weakness [R53.1] AKI (acute kidney injury) ([place]) [N17.9]  Patient is known to our office via previous hospitalizations and is followed in our office by Dr. Lanora Manis.    Patient seen resting in bed Daughter at bedside Appetite remains poor Creatinine continues to increase Urine output recorded of 1.35L in past 24 hours  Objective:  Vital signs in last 24 hours:  Temp:  [97.8 F (36.6 C)-98.2 F (36.8 C)] 98.2 F (36.8 C) (11/10 0733) Pulse Rate:  [58-66] 62 (11/10 0733) Resp:  [17-20] 20 (11/10 0733) BP: (105-140)/(37-51) 127/48 (11/10 0733) SpO2:  [94 %-98 %] 94 % (11/10 0733)  Weight change:  Filed Weights   08/11/21 1921  Weight: 59.9 kg    Intake/Output: I/O last 3 completed shifts: In: 2286 [P.O.:300; I.V.:1936; IV Piggyback:50] Out: 6948 [Urine:1750]   Intake/Output this shift:  Total I/O In: -  Out: 820 [Urine:820]  Physical Exam: General: NAD, resting comfortably  Head: Normocephalic, atraumatic. Moist oral mucosal membranes  Eyes: Anicteric  Lungs:  Clear to auscultation, normal effort, 1 L O2 [place]  Heart: Regular rate and rhythm  Abdomen:  Soft, tender, nondistended  Extremities: 1+ peripheral edema.  Neurologic: Nonfocal, moving all four extremities  Skin: No lesions       Basic Metabolic Panel: Recent Labs  Lab 08/11/21 1930 08/12/21 0648 08/13/21 0526 08/14/21 0553 08/15/21 0423  NA 130* 134* 134* 131* 135  K 5.7* 5.0 5.2* 6.0* 5.7*  CL  97* 102 105 103 107  CO2 23 22 20* 18* 17*  GLUCOSE 100* 79 94 113* 83  BUN 118* 124* 118* 118* 68*  CREATININE 3.42* 3.26* 2.76* 3.01* 3.42*  CALCIUM 8.8* 8.8* 8.3* 8.3* 8.1*  MG  --   --  2.3 2.4  --   PHOS  --   --  6.7* 7.5*  --      Liver Function Tests: Recent Labs  Lab 08/12/21 0006 08/13/21 0526 08/14/21 0553 08/15/21 0423  AST 1,049* 906* 994* 798*  ALT 494* 448* 513* 458*  ALKPHOS 163* 167* 183* 143*  BILITOT 0.9 0.7 0.7 0.9  PROT 6.7 5.7* 6.3* 5.5*  ALBUMIN 2.6* 2.1* 2.4* 2.1*    No results for input(s): LIPASE, AMYLASE in the last 168 hours. No results for input(s): AMMONIA in the last 168 hours.  CBC: Recent Labs  Lab 08/11/21 1930 08/12/21 0648 08/13/21 0526 08/14/21 0553  WBC 8.7 7.7 9.4 11.5*  NEUTROABS  --   --  7.9* 9.6*  HGB 9.6* 9.5* 9.0* 9.3*  HCT 29.7* 29.0* 27.3* 28.4*  MCV 84.6 85.8 85.6 84.5  PLT 190 190 196 223     Cardiac Enzymes: Recent Labs  Lab 08/12/21 0826 08/13/21 0526 08/15/21 0423  CKTOTAL 2,617* 3,846* 18,330*     BNP: Invalid input(s): POCBNP  CBG: Recent Labs  Lab 08/11/21 [place] 95     Microbiology: Results for orders placed or performed during the hospital encounter of 08/11/21  Resp Panel by RT-PCR (Flu A&B, Covid) Nasopharyngeal Swab     Status: None  Collection Time: 08/12/21 12:03 AM   Specimen: Nasopharyngeal Swab; Nasopharyngeal(NP) swabs in vial transport medium  Result Value Ref Range Status   SARS Coronavirus 2 by RT PCR NEGATIVE NEGATIVE Final    Comment: (NOTE) SARS-CoV-2 target nucleic acids are NOT DETECTED.  The SARS-CoV-2 RNA is generally detectable in upper respiratory specimens during the acute phase of infection. The lowest concentration of SARS-CoV-2 viral copies this assay can detect is 138 copies/mL. A negative result does not preclude SARS-Cov-2 infection and should not be used as the sole basis for treatment or other patient management decisions. A negative result may  occur with  improper specimen collection/handling, submission of specimen other than nasopharyngeal swab, presence of viral mutation(s) within the areas targeted by this assay, and inadequate number of viral copies(<138 copies/mL). A negative result must be combined with clinical observations, patient history, and epidemiological information. The expected result is Negative.  Fact Sheet for Patients:  EntrepreneurPulse.com.au  Fact Sheet for Healthcare Providers:  IncredibleEmployment.be  This test is no t yet approved or cleared by the [place] FDA and  has been authorized for detection and/or diagnosis of SARS-CoV-2 by FDA under an Emergency Use Authorization (EUA). This EUA will remain  in effect (meaning this test can be used) for the duration of the COVID-19 declaration under Section 564(b)(1) of the Act, 21 U.S.C.section 360bbb-3(b)(1), unless the authorization is terminated  or revoked sooner.       Influenza A by PCR NEGATIVE NEGATIVE Final   Influenza B by PCR NEGATIVE NEGATIVE Final    Comment: (NOTE) The Xpert Xpress SARS-CoV-2/FLU/RSV plus assay is intended as an aid in the diagnosis of influenza from Nasopharyngeal swab specimens and should not be used as a sole basis for treatment. Nasal washings and aspirates are unacceptable for Xpert Xpress SARS-CoV-2/FLU/RSV testing.  Fact Sheet for Patients: EntrepreneurPulse.com.au  Fact Sheet for Healthcare Providers: IncredibleEmployment.be  This test is not yet approved or cleared by the [place] FDA and has been authorized for detection and/or diagnosis of SARS-CoV-2 by FDA under an Emergency Use Authorization (EUA). This EUA will remain in effect (meaning this test can be used) for the duration of the COVID-19 declaration under Section 564(b)(1) of the Act, 21 U.S.C. section 360bbb-3(b)(1), unless the authorization is terminated  or revoked.  Performed at [facility], [address] [place], [place] [address]     Coagulation Studies: No results for input(s): LABPROT, INR in the last 72 hours.  Urinalysis: No results for input(s): COLORURINE, LABSPEC, PHURINE, GLUCOSEU, HGBUR, BILIRUBINUR, KETONESUR, PROTEINUR, UROBILINOGEN, NITRITE, LEUKOCYTESUR in the last 72 hours.  Invalid input(s): APPERANCEUR     Imaging: MR CERVICAL SPINE WO CONTRAST  Addendum Date: 08/13/2021   ADDENDUM REPORT: 08/13/2021 15:23 IMPRESSION: 6.  Moderate right and trace left pleural effusions. Electronically Signed   By: Merilyn Baba M.D.   On: 08/13/2021 15:23   Result Date: 08/13/2021 CLINICAL DATA:  Upper extremity weakness, incontinence, leg numbness EXAM: MRI CERVICAL AND THORACIC SPINE WITHOUT CONTRAST TECHNIQUE: Multiplanar and multiecho pulse sequences of the cervical spine, to include the craniocervical junction and cervicothoracic junction, and the thoracic spine, were obtained without intravenous contrast. COMPARISON:  No prior MRI, correlation is made with CT cervical spine 06/14/2021 FINDINGS: MRI CERVICAL SPINE FINDINGS Alignment: Unchanged trace anterolisthesis C4 on C5 and C5 on C6. Vertebrae: No acute fracture or suspicious osseous lesion. Congenitally short pedicles, which narrow the AP diameter of the spinal canal. Cord: Normal signal and morphology. Posterior Fossa, vertebral arteries,  paraspinal tissues: Negative. Disc levels: C2-C3: No significant disc bulge. Facet arthropathy and right-greater-than-left uncovertebral hypertrophy. No spinal canal stenosis. Mild right neural foraminal narrowing. C3-C4: Central disc extrusion with 4 mm of caudal migration, which indents the ventral spinal cord. Facet arthropathy. Mild to moderate bilateral neural foraminal narrowing. Moderate spinal canal stenosis. C4-C5: Disc bulge with superimposed central protrusion, which indents the ventral cord. Ligamentum flavum hypertrophy.  Left greater than right facet and uncovertebral hypertrophy. Moderate to severe spinal canal stenosis. Moderate bilateral neural foraminal narrowing. C5-C6: Mild anterolisthesis and disc unroofing. Facet arthropathy. Mild bilateral neural foraminal narrowing. No spinal canal stenosis. C6-C7: Minimal disc bulge. Left-greater-than-right uncovertebral hypertrophy. Facet arthropathy. No spinal canal stenosis. Moderate bilateral neural foraminal narrowing. C7-T1: No significant disc bulge. Facet arthropathy. No spinal canal stenosis. Mild bilateral neural foraminal narrowing. MRI THORACIC SPINE FINDINGS Alignment:  No listhesis. Vertebrae: No acute fracture or suspicious osseous lesion. Cord:  Normal in signal and morphology. Paraspinal and other soft tissues: Moderate right and trace left pleural effusion. Disc levels: Mild degenerative changes without significant spinal canal stenosis or high-grade neural foraminal narrowing. IMPRESSION: 1. No evidence of cord signal abnormality in the cervical or thoracic spine. 2. C4-C5 moderate to severe spinal canal stenosis and moderate bilateral neural foraminal narrowing. 3. C3-C4 moderate spinal canal stenosis and mild-to-moderate bilateral neural foraminal narrowing. 4. C6-C7 moderate bilateral neural foraminal narrowing. 5. No significant spinal canal stenosis or neural foraminal narrowing in the thoracic spine. Electronically Signed: By: Merilyn Baba M.D. On: 08/13/2021 15:19   MR THORACIC SPINE WO CONTRAST  Addendum Date: 08/13/2021   ADDENDUM REPORT: 08/13/2021 15:23 IMPRESSION: 6.  Moderate right and trace left pleural effusions. Electronically Signed   By: Merilyn Baba M.D.   On: 08/13/2021 15:23   Result Date: 08/13/2021 CLINICAL DATA:  Upper extremity weakness, incontinence, leg numbness EXAM: MRI CERVICAL AND THORACIC SPINE WITHOUT CONTRAST TECHNIQUE: Multiplanar and multiecho pulse sequences of the cervical spine, to include the craniocervical junction and  cervicothoracic junction, and the thoracic spine, were obtained without intravenous contrast. COMPARISON:  No prior MRI, correlation is made with CT cervical spine 06/14/2021 FINDINGS: MRI CERVICAL SPINE FINDINGS Alignment: Unchanged trace anterolisthesis C4 on C5 and C5 on C6. Vertebrae: No acute fracture or suspicious osseous lesion. Congenitally short pedicles, which narrow the AP diameter of the spinal canal. Cord: Normal signal and morphology. Posterior Fossa, vertebral arteries, paraspinal tissues: Negative. Disc levels: C2-C3: No significant disc bulge. Facet arthropathy and right-greater-than-left uncovertebral hypertrophy. No spinal canal stenosis. Mild right neural foraminal narrowing. C3-C4: Central disc extrusion with 4 mm of caudal migration, which indents the ventral spinal cord. Facet arthropathy. Mild to moderate bilateral neural foraminal narrowing. Moderate spinal canal stenosis. C4-C5: Disc bulge with superimposed central protrusion, which indents the ventral cord. Ligamentum flavum hypertrophy. Left greater than right facet and uncovertebral hypertrophy. Moderate to severe spinal canal stenosis. Moderate bilateral neural foraminal narrowing. C5-C6: Mild anterolisthesis and disc unroofing. Facet arthropathy. Mild bilateral neural foraminal narrowing. No spinal canal stenosis. C6-C7: Minimal disc bulge. Left-greater-than-right uncovertebral hypertrophy. Facet arthropathy. No spinal canal stenosis. Moderate bilateral neural foraminal narrowing. C7-T1: No significant disc bulge. Facet arthropathy. No spinal canal stenosis. Mild bilateral neural foraminal narrowing. MRI THORACIC SPINE FINDINGS Alignment:  No listhesis. Vertebrae: No acute fracture or suspicious osseous lesion. Cord:  Normal in signal and morphology. Paraspinal and other soft tissues: Moderate right and trace left pleural effusion. Disc levels: Mild degenerative changes without significant spinal canal stenosis or high-grade neural  foraminal narrowing.  IMPRESSION: 1. No evidence of cord signal abnormality in the cervical or thoracic spine. 2. C4-C5 moderate to severe spinal canal stenosis and moderate bilateral neural foraminal narrowing. 3. C3-C4 moderate spinal canal stenosis and mild-to-moderate bilateral neural foraminal narrowing. 4. C6-C7 moderate bilateral neural foraminal narrowing. 5. No significant spinal canal stenosis or neural foraminal narrowing in the thoracic spine. Electronically Signed: By: Merilyn Baba M.D. On: 08/13/2021 15:19   US RENAL  Result Date: 08/14/2021 CLINICAL DATA:  Renal failure EXAM: RENAL / URINARY TRACT ULTRASOUND COMPLETE COMPARISON:  Renal ultrasound 08/12/2021 FINDINGS: Right Kidney: Renal measurements: 8.8 x 4.2 x 4.4 cm = volume: 85 mL. Increased echogenicity with no hydronephrosis. Left Kidney: Renal measurements: 8.3 x 3.8 x 3.5 cm = volume: 57 mL. Mildly increased echogenicity. No hydronephrosis. Bladder: Appears normal for degree of bladder distention. Other: Prostate gland is enlarged with nodular protrusion into the base of the urinary bladder. Right pleural effusion. IMPRESSION: 1. Increased echogenicity of the kidneys suggesting medical renal disease. No hydronephrosis. 2. Prostatomegaly. 3. Right pleural effusion. Electronically Signed   By: Ofilia Neas M.D.   On: 08/14/2021 12:07   US Abdomen Limited RUQ (LIVER/GB)  Result Date: 08/14/2021 CLINICAL DATA:  Elevated liver function studies. EXAM: ULTRASOUND ABDOMEN LIMITED RIGHT UPPER QUADRANT COMPARISON:  CT scan 06/04/2021 FINDINGS: Gallbladder: Echogenic shadowing gallstones are noted in the gallbladder. The largest calculus measures 1.3 cm. There is also mild gallbladder wall thickening and pericholecystic fluid. Common bile duct: Diameter: 9.0 mm Liver: Normal echogenicity without focal lesion or biliary dilatation. Portal vein is patent on color Doppler imaging with normal direction of blood flow towards the liver. Other:  Large right pleural effusion is noted. IMPRESSION: 1. Cholelithiasis and findings suspicious for acute cholecystitis. 2. Dilated common bile duct could suggest an obstructing common bile duct stone. MRI abdomen/MRCP may be helpful for further evaluation. 3. Right pleural effusion. Electronically Signed   By: Marijo Sanes M.D.   On: 08/14/2021 09:43     Medications:    piperacillin-tazobactam (ZOSYN)  IV 2.25 g (08/15/21 0421)   sodium bicarbonate 150 mEq in D5W infusion      feeding supplement  237 mL Oral BID BM   [START ON 08/16/2021] heparin injection (subcutaneous)  5,000 Units Subcutaneous Q8H   lidocaine  1 patch Transdermal Q24H   pantoprazole  40 mg Oral BID   patiromer  16.8 g Oral Daily   acetaminophen **OR** acetaminophen, magnesium hydroxide, morphine injection, ondansetron **OR** ondansetron (ZOFRAN) IV, oxyCODONE, traZODone  Assessment/ Plan:  [name] is a 87 y.o.  male with a past medical history including CAD with CABG, diastolic CHF, hypertension, CVA, and chronic kidney disease stage IIIa.  Patient is admitted for Shortness of breath [R06.02] Hyperkalemia [E87.5] Positive D dimer [R79.89] Elevated troponin [R77.8] Swelling of arm [M79.89] Generalized weakness [R53.1] AKI (acute kidney injury) ([place]) [N17.9]   Acute Kidney Injury on chronic kidney disease stage 3a with baseline creatinine 1.3 and GFR of 54 on 06/18/21.  Acute kidney injury with unclear etiology at this time Risk factors include hypertension and diastolic heart failure. Olmasartan and furosemide held Renal ultrasound negative for obstruction.  Initial renal ultrasound completed 08/12/2021 negative for obstruction with minimal visual of pleural effusion.  Repeat renal ultrasound today shows no obstruction with right pleural effusion and prostatomegaly. No acute need for dialysis  Creatinine continues to increase. IVF changed to Sodium bicarb at 75 ml/hr. Will continue to monitor  Lab  Results  Component Value Date  CREATININE 3.42 (H) 08/15/2021   CREATININE 3.01 (H) 08/14/2021   CREATININE 2.76 (H) 08/13/2021    Intake/Output Summary (Last 24 hours) at 08/15/2021 1102 Last data filed at 08/15/2021 0715 Gross per 24 hour  Intake 1912.27 ml  Output 2170 ml  Net -257.73 ml    2. Hypertension with chronic kidney disease Home regimen includes Amlodipine, olmesartan and furosemide All held. BP 118/50  3.Diastolic chronic heart failure: Echo on 04/10/21 shows EF of 25-30%.  Continue IV hydration with close monitoring and Furosemide can be given for fluid management  4. Hyperkalemia/hyponatremia Potassium 5.7 today. Will order [place]. Sodium improved 135.    LOS: 3   11/10/202211:02 AM

## 2021-08-15 NOTE — Consult Note (Signed)
Reason for Consult: Elevated CPK  Referring Physician: Hospitalist  [facility]   HPI: 87 year old Spanish American.  History from chart and from daughter who interprets.  Complicated story.  Patient has history of coronary disease renal insufficiency.  Status post CABG.  Status post CVA and brain aneurysm. And chart review.  Had an episode complaining of weakness in January 2022.  He has had perhaps respiratory infection and had symptomatic medicines for cough.  Had been on simvastatin.  He was evaluated by his primary care physician Dr. Lovie Macadamia.  CPK was over 20,000.  Troponin 91.  Liver functions elevated.  Complained of weak legs and lift arms at that time.  He had no rash or fever.  Simvastatin was discontinued.  CPK that came down to 10,000 and then down to 300 which was normal.  Weakness symptoms abated In May he was admitted for congestive heart failure.  On diuretics.  In the spring he was evaluated by nephrology.  Had ANA of 1-80.  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  Had endoscopy with ulcers.  Had been on a blood thinner that was discontinued.  He was placed on Protonix twice a day.  About 2 weeks later he had COVID.  Positive blood test.  Cough minimal fever He has been complaining of numbness in his feet off and on for a year as well as back as well as back pain.  Episodically to use a walker he was using his walker last Thursday and then in the middle the night awakened and felt like he was a lot weaker and could hardly feel his feet.  He slid down from the bed slowly.  No definite fall or muscle injury.  Later that morning he was evaluated by his daughter.  Legs are very swollen.  Arms were swollen.  The following day he could hardly turn over in bed or raise a spoon to his mouth.  He was admitted.  Liver functions were elevated to AST over thousand.  Albumin 2.6.  Creatinine at 3.4.  CPK 2600.   Myoglobinuria. Was evaluated by neurosurgery and felt to have cervical stenosis and lumbar stenosis He had a good deal of edema.  Ejection fraction previously was 25.  CPK is risen to 18,000.  He has developed edema in his flank in his arms.  Ultrasound negative for DVT. According to the daughter he has not had Raynaud's.  There is been no skin rash.  No significant weight change.  No family history of connective tissue disease.  Some difficulty swallowing which according to the daughter daughter was since last admission and his prior stroke other lab markers show elevated sed rate at 120 elevated CRP CT of thoracic spine showed patchy air airspace Renal ultrasound showed medical renal disease.  Abdominal ultrasound showed cholelithiasis with possible obstruction but HIDA showed minimal uptake in the gallbladder and gallbladder extraction  PMH: Per HPI.  Congestive heart failure.  Spinal stenosis  SURGICAL HISTORY: No joint surgery  Family History: Negative for connective tissue disease  Social History: No significant alcohol or drug use  Allergies:  Allergies  Allergen Reactions   Hydralazine     Pt has severe dizziness with it, and does not want to take it.    Medications: Scheduled:  feeding supplement  237 mL Oral BID BM   [START ON 08/16/2021] heparin injection (subcutaneous)  5,000 Units Subcutaneous Q8H   lidocaine  1 patch Transdermal Q24H   pantoprazole  40 mg Oral BID   patiromer  16.8 g Oral Daily        ROS: No recent abdominal pain.  No diarrhea.  No small joint symptoms   PHYSICAL EXAM: Blood pressure (!) 125/50, pulse 60, temperature 97.7 F (36.5 C), resp. rate 18, height 5\' 1"  (1.549 m), weight 59.9 kg, SpO2 91 %. Resting comfortably in bed.  Arouses to follow commands with his daughters interpretation. Distant lung sounds.  No significant murmur.  Regular rhythm.  Nontender abdomen Diffuse edema.  He has pitting edema in his flanks and his back.  There is  biceps and triceps are edematous and tender and pitting.  His thigh is edematous and pitting.  2+ ankle edema. Musculoskeletal: Decreased range of motion of cervical spine.  Shoulders move well.  Hands without synovitis or telangiectasias or sclerodactyly.  Hips move well.  No definite knee effusions Neurologic: Cannot raise his head up off the bed.  Triceps are 4/5.  Grip is 5/5.  Cannot raise or hold his legs off the bed.  Plantar and dorsiflexors are 4/5.  Diffusely hyporeflexic  Assessment: Acute rhabdomyolysis.  Most likely statin related. -He had this previously in January of 2022.  Resolved with discontinuation of statin.  CPK went to over 20,000 at that time.  Came back to normal after statin cessation -Low suspicion for chronic inflammatory myositis Diffuse edema.  Mainly over muscles.  Component of fluid retention and muscle edema Cardiomyopathy Cervical spinal stenosis Lumbar spinal stenosis with chronic back and leg pain and weakness and numbness Acute on chronic renal insufficiency  Recommendations: Agree with discontinuation of any statin product and IV hydration.   -Difficult with his significant amount of edema and his cardiomyopathy.   -Appreciate renal input -Since this previously resolved, I would not favor a muscle biopsy or empiric steroid treatment at this time.  There is an entity with chronic statin myopathy of myopathic immune mediated necrotizing vasculitis that would require immunotherapy. Most of those patients had positive anti-HMGCR antibodies( a send out lab that takes a while to result)  Daniel Bolton 08/15/2021, 7:03 PM

## 2021-08-16 DIAGNOSIS — N179 Acute kidney failure, unspecified: Secondary | ICD-10-CM | POA: Diagnosis not present

## 2021-08-16 DIAGNOSIS — I5043 Acute on chronic combined systolic (congestive) and diastolic (congestive) heart failure: Secondary | ICD-10-CM

## 2021-08-16 DIAGNOSIS — I2510 Atherosclerotic heart disease of native coronary artery without angina pectoris: Secondary | ICD-10-CM | POA: Diagnosis not present

## 2021-08-16 DIAGNOSIS — E7849 Other hyperlipidemia: Secondary | ICD-10-CM | POA: Diagnosis not present

## 2021-08-16 LAB — COMPREHENSIVE METABOLIC PANEL
ALT: 407 U/L — ABNORMAL HIGH (ref 0–44)
AST: 544 U/L — ABNORMAL HIGH (ref 15–41)
Albumin: 1.8 g/dL — ABNORMAL LOW (ref 3.5–5.0)
Alkaline Phosphatase: 170 U/L — ABNORMAL HIGH (ref 38–126)
Anion gap: 9 (ref 5–15)
BUN: 113 mg/dL — ABNORMAL HIGH (ref 8–23)
CO2: 22 mmol/L (ref 22–32)
Calcium: 8 mg/dL — ABNORMAL LOW (ref 8.9–10.3)
Chloride: 105 mmol/L (ref 98–111)
Creatinine, Ser: 3.28 mg/dL — ABNORMAL HIGH (ref 0.61–1.24)
GFR, Estimated: 18 mL/min — ABNORMAL LOW (ref >60)
Glucose, Bld: 137 mg/dL — ABNORMAL HIGH (ref 70–99)
Potassium: 4.9 mmol/L (ref 3.5–5.1)
Sodium: 136 mmol/L (ref 135–145)
Total Bilirubin: 1.1 mg/dL (ref 0.3–1.2)
Total Protein: 5.7 g/dL — ABNORMAL LOW (ref 6.5–8.1)

## 2021-08-16 LAB — CK: Total CK: 9296 U/L — ABNORMAL HIGH (ref 49–397)

## 2021-08-16 MED ORDER — POLYETHYLENE GLYCOL 3350 17 G PO PACK
17.0000 g | PACK | Freq: Every day | ORAL | Status: DC
  Administered 2021-08-17 – 2021-08-25 (×8): 17 g via ORAL
  Filled 2021-08-16 (×8): qty 1

## 2021-08-16 MED ORDER — FUROSEMIDE 10 MG/ML IJ SOLN
40.0000 mg | Freq: Once | INTRAMUSCULAR | Status: AC
  Administered 2021-08-16: 12:00:00 40 mg via INTRAVENOUS
  Filled 2021-08-16: qty 4

## 2021-08-16 MED ORDER — SENNOSIDES-DOCUSATE SODIUM 8.6-50 MG PO TABS
1.0000 | ORAL_TABLET | Freq: Two times a day (BID) | ORAL | Status: DC
  Administered 2021-08-17 – 2021-08-25 (×13): 1 via ORAL
  Filled 2021-08-16 (×16): qty 1

## 2021-08-16 NOTE — Progress Notes (Signed)
OT Cancellation Note  Patient Details Name: Man Effertz MRN: [id number] DOB: 1934-07-19   Cancelled Treatment:    Reason Eval/Treat Not Completed: Patient declined, no reason specified. Pt in respiratory distress today per RN and now on [place]. Pt declined therapeutic intervention today secondary to fatigue. OT will re-attempt as able.   Darleen Crocker, MS, OTR/L , CBIS ascom [phone]  08/16/21, 2:04 PM

## 2021-08-16 NOTE — Progress Notes (Signed)
[facility]  ROUNDING NOTE   Subjective:   Daniel Bolton is a 87 year old Hispanic male with a past medical history including CAD with CABG, diastolic CHF, hypertension, CVA, and chronic kidney disease stage IIIa.  Patient presents to the emergency room with complaints of generalized weakness and increased swelling.  Patient will be admitted for Shortness of breath [R06.02] Hyperkalemia [E87.5] Positive D dimer [R79.89] Elevated troponin [R77.8] Swelling of arm [M79.89] Generalized weakness [R53.1] AKI (acute kidney injury) ([place]) [N17.9]  Patient is known to our office via previous hospitalizations and is followed in our office by Dr. Lanora Manis.    Patient seen at bedside. Daughter at the bedside again this AM. Creatinine down to 3.28 however BUN quite high at 113. CK is down to 9296.  At the moment.  Objective:  Vital signs in last 24 hours:  Temp:  [97.5 F (36.4 C)-99.1 F (37.3 C)] 99.1 F (37.3 C) (11/11 0729) Pulse Rate:  [60-71] 71 (11/11 0729) Resp:  [18-20] 20 (11/11 0729) BP: (103-145)/(37-56) 117/43 (11/11 0729) SpO2:  [89 %-93 %] 92 % (11/11 0729)  Weight change:  Filed Weights   08/11/21 1921  Weight: 59.9 kg    Intake/Output: I/O last 3 completed shifts: In: 3375.8 [P.O.:200; I.V.:2875.8; IV Piggyback:300] Out: 5361 [Urine:3145]   Intake/Output this shift:  No intake/output data recorded.  Physical Exam: General: NAD, resting comfortably  Head: Normocephalic, atraumatic. Moist oral mucosal membranes  Eyes: Anicteric  Lungs:  Clear to auscultation, normal effort, 1 L O2 [place]  Heart: Regular rate and rhythm  Abdomen:  Soft, tender, nondistended  Extremities: 1+ peripheral edema.  Neurologic: Nonfocal, moving all four extremities  Skin: No lesions       Basic Metabolic Panel: Recent Labs  Lab 08/12/21 0648 08/13/21 0526 08/14/21 0553 08/15/21 0423 08/16/21 0633  NA 134* 134* 131* 135 136  K 5.0 5.2* 6.0* 5.7* 4.9  CL 102  105 103 107 105  CO2 22 20* 18* 17* 22  GLUCOSE 79 94 113* 83 137*  BUN 124* 118* 118* 68* 113*  CREATININE 3.26* 2.76* 3.01* 3.42* 3.28*  CALCIUM 8.8* 8.3* 8.3* 8.1* 8.0*  MG  --  2.3 2.4  --   --   PHOS  --  6.7* 7.5*  --   --      Liver Function Tests: Recent Labs  Lab 08/12/21 0006 08/13/21 0526 08/14/21 0553 08/15/21 0423 08/16/21 0633  AST 1,049* 906* 994* 798* 544*  ALT 494* 448* 513* 458* 407*  ALKPHOS 163* 167* 183* 143* 170*  BILITOT 0.9 0.7 0.7 0.9 1.1  PROT 6.7 5.7* 6.3* 5.5* 5.7*  ALBUMIN 2.6* 2.1* 2.4* 2.1* 1.8*    No results for input(s): LIPASE, AMYLASE in the last 168 hours. No results for input(s): AMMONIA in the last 168 hours.  CBC: Recent Labs  Lab 08/11/21 1930 08/12/21 0648 08/13/21 0526 08/14/21 0553  WBC 8.7 7.7 9.4 11.5*  NEUTROABS  --   --  7.9* 9.6*  HGB 9.6* 9.5* 9.0* 9.3*  HCT 29.7* 29.0* 27.3* 28.4*  MCV 84.6 85.8 85.6 84.5  PLT 190 190 196 223     Cardiac Enzymes: Recent Labs  Lab 08/12/21 0826 08/13/21 0526 08/15/21 0423 08/16/21 0633  CKTOTAL 2,617* 3,846* 18,330* 9,296*     BNP: Invalid input(s): POCBNP  CBG: Recent Labs  Lab 08/11/21 1938  GLUCAP 95     Microbiology: Results for orders placed or performed during the hospital encounter of 08/11/21  Resp Panel by  RT-PCR (Flu A&B, Covid) Nasopharyngeal Swab     Status: None   Collection Time: 08/12/21 12:03 AM   Specimen: Nasopharyngeal Swab; Nasopharyngeal(NP) swabs in vial transport medium  Result Value Ref Range Status   SARS Coronavirus 2 by RT PCR NEGATIVE NEGATIVE Final    Comment: (NOTE) SARS-CoV-2 target nucleic acids are NOT DETECTED.  The SARS-CoV-2 RNA is generally detectable in upper respiratory specimens during the acute phase of infection. The lowest concentration of SARS-CoV-2 viral copies this assay can detect is 138 copies/mL. A negative result does not preclude SARS-Cov-2 infection and should not be used as the sole basis for treatment  or other patient management decisions. A negative result may occur with  improper specimen collection/handling, submission of specimen other than nasopharyngeal swab, presence of viral mutation(s) within the areas targeted by this assay, and inadequate number of viral copies(<138 copies/mL). A negative result must be combined with clinical observations, patient history, and epidemiological information. The expected result is Negative.  Fact Sheet for Patients:  EntrepreneurPulse.com.au  Fact Sheet for Healthcare Providers:  IncredibleEmployment.be  This test is no t yet approved or cleared by the [place] FDA and  has been authorized for detection and/or diagnosis of SARS-CoV-2 by FDA under an Emergency Use Authorization (EUA). This EUA will remain  in effect (meaning this test can be used) for the duration of the COVID-19 declaration under Section 564(b)(1) of the Act, 21 U.S.C.section 360bbb-3(b)(1), unless the authorization is terminated  or revoked sooner.       Influenza A by PCR NEGATIVE NEGATIVE Final   Influenza B by PCR NEGATIVE NEGATIVE Final    Comment: (NOTE) The Xpert Xpress SARS-CoV-2/FLU/RSV plus assay is intended as an aid in the diagnosis of influenza from Nasopharyngeal swab specimens and should not be used as a sole basis for treatment. Nasal washings and aspirates are unacceptable for Xpert Xpress SARS-CoV-2/FLU/RSV testing.  Fact Sheet for Patients: EntrepreneurPulse.com.au  Fact Sheet for Healthcare Providers: IncredibleEmployment.be  This test is not yet approved or cleared by the [place] FDA and has been authorized for detection and/or diagnosis of SARS-CoV-2 by FDA under an Emergency Use Authorization (EUA). This EUA will remain in effect (meaning this test can be used) for the duration of the COVID-19 declaration under Section 564(b)(1) of the Act, 21 U.S.C. section  360bbb-3(b)(1), unless the authorization is terminated or revoked.  Performed at [facility], [address] [place], [place] [address]     Coagulation Studies: No results for input(s): LABPROT, INR in the last 72 hours.  Urinalysis: No results for input(s): COLORURINE, LABSPEC, PHURINE, GLUCOSEU, HGBUR, BILIRUBINUR, KETONESUR, PROTEINUR, UROBILINOGEN, NITRITE, LEUKOCYTESUR in the last 72 hours.  Invalid input(s): APPERANCEUR     Imaging: NM Hepatobiliary Liver Func  Result Date: 08/15/2021 CLINICAL DATA:  Abdominal pain.  Question cholecystitis. EXAM: NUCLEAR MEDICINE HEPATOBILIARY IMAGING TECHNIQUE: Sequential images of the abdomen were obtained. Gallbladder activity was not confidently identified after 45 minutes. Therefore, the patient was given 2 mg of IV morphine and follow-up imaging was obtained. RADIOPHARMACEUTICALS:  4.73 mCi Tc-58m  Choletec IV COMPARISON:  Ultrasound 08/14/2021 FINDINGS: Prompt uptake and biliary excretion of activity by the liver. After 45 minutes, there was activity in the small bowel but no definite gallbladder uptake. Following the administration of IV morphine, there was uptake in the gallbladder. Gallbladder ejection fraction was not calculated. IMPRESSION: Gallbladder uptake following the administration of IV morphine. Cystic duct is patent. Electronically Signed   By: Scherrie Gerlach.D.  On: 08/15/2021 11:15   US RENAL  Result Date: 08/14/2021 CLINICAL DATA:  Renal failure EXAM: RENAL / URINARY TRACT ULTRASOUND COMPLETE COMPARISON:  Renal ultrasound 08/12/2021 FINDINGS: Right Kidney: Renal measurements: 8.8 x 4.2 x 4.4 cm = volume: 85 mL. Increased echogenicity with no hydronephrosis. Left Kidney: Renal measurements: 8.3 x 3.8 x 3.5 cm = volume: 57 mL. Mildly increased echogenicity. No hydronephrosis. Bladder: Appears normal for degree of bladder distention. Other: Prostate gland is enlarged with nodular protrusion into the base of the urinary  bladder. Right pleural effusion. IMPRESSION: 1. Increased echogenicity of the kidneys suggesting medical renal disease. No hydronephrosis. 2. Prostatomegaly. 3. Right pleural effusion. Electronically Signed   By: Ofilia Neas M.D.   On: 08/14/2021 12:07     Medications:    piperacillin-tazobactam (ZOSYN)  IV 2.25 g (08/16/21 0443)   sodium bicarbonate 150 mEq in D5W infusion 75 mL/hr at 08/16/21 0556    feeding supplement  237 mL Oral BID BM   heparin injection (subcutaneous)  5,000 Units Subcutaneous Q8H   lidocaine  1 patch Transdermal Q24H   pantoprazole  40 mg Oral BID   patiromer  16.8 g Oral Daily   acetaminophen **OR** acetaminophen, magnesium hydroxide, morphine injection, ondansetron **OR** ondansetron (ZOFRAN) IV, oxyCODONE, traZODone  Assessment/ Plan:  Daniel Bolton is a 87 y.o.  male with a past medical history including CAD with CABG, chronic systolic CHF, hypertension, CVA, and chronic kidney disease stage IIIa.  Patient is admitted for Shortness of breath [R06.02] Hyperkalemia [E87.5] Positive D dimer [R79.89] Elevated troponin [R77.8] Swelling of arm [M79.89] Generalized weakness [R53.1] AKI (acute kidney injury) ([place]) [N17.9]   Acute Kidney Injury on chronic kidney disease stage 3a with baseline creatinine 1.3 and GFR of 54 on 06/18/21/  Rhabdomyolysis.  Acute kidney injury with unclear etiology at this time Risk factors include hypertension and diastolic heart failure. Olmasartan and furosemide held Renal ultrasound negative for obstruction.  Initial renal ultrasound completed 08/12/2021 negative for obstruction with minimal visual of pleural effusion.  Repeat renal ultrasound today shows no obstruction with right pleural effusion and prostatomegaly. No acute need for dialysis  Renal function has improved slightly as creatinine down to 3.28 with a BUN of 113.  CK down to 9296.  Continue relatively gentle hydration as he does have history of heart  failure.  Lab Results  Component Value Date   CREATININE 3.28 (H) 08/16/2021   CREATININE 3.42 (H) 08/15/2021   CREATININE 3.01 (H) 08/14/2021    Intake/Output Summary (Last 24 hours) at 08/16/2021 1016 Last data filed at 08/16/2021 0641 Gross per 24 hour  Intake 1463.53 ml  Output 1400 ml  Net 63.53 ml    2. Hypertension with chronic kidney disease Home regimen includes Amlodipine, olmesartan and furosemide All held.    3.chronic systolic heart failure.  Given ongoing rhabdomyolysis we are providing gentle IV fluid hydration but we will need to monitor for signs of volume overload.  Echo on 04/10/21 shows EF of 25-30%.  Continue IV hydration with close monitoring and Furosemide can be given for fluid management  4. Hyperkalemia/hyponatremia potassium down to 4.9.  Maintain the patient on [place].  LOS: 4 Monike Bragdon 11/11/202210:16 AM

## 2021-08-16 NOTE — Progress Notes (Signed)
PT Cancellation Note  Patient Details Name: Daniel Bolton MRN: [id number] DOB: 03-Mar-1934   Cancelled Treatment:    Reason Eval/Treat Not Completed: Other (comment). Per RN (assistance with translation) pt declining working with PT today, reported feeling very tired. Pt now on [place]. PT to re-attempt as able.  Lieutenant Diego PT, DPT 1:42 PM,08/16/21

## 2021-08-16 NOTE — Progress Notes (Signed)
PROGRESS NOTE    Daniel Bolton  HKV:[id number] DOB: 08-20-1934 DOA: 08/11/2021 PCP: [name], MD   Chief Complaint  Patient presents with   Weakness    Brief Narrative: 87 y.o. Hispanic male with medical history significant for essential hypertension, CAD status post CABG, CHF, chronic kidney disease and CVA, who presented to the emergency room with acute onset   generalized weakness since Friday with diminished appetite and oral intake.  He's been found to have AKI as well as generalized weakness.  Being treated for rhabdo with IVF (caution given hx HF).  Other potential causes for his weakness being evaluated as noted below.    11/9 family at bedside. No overnight issues. 11/10 reports no abd pain/n/v. HIDA negative 11/11 on 2 L  then more sob, requiring 8L. Spoke to nephrology, recommended lasix 40mg  iv x1, will dc ivf. If UO starts dropping will need to initial HD.   Assessment & Plan:   Active Problems:   Moderate major depression, single episode (HCC)   Coronary artery disease   AKI (acute kidney injury) ([place])   Normochromic normocytic anemia   Essential hypertension   Hyponatremia   Acute on chronic combined systolic and diastolic CHF (congestive heart failure) (HCC)   HLD (hyperlipidemia)   Hyperkalemia   AAA (abdominal aortic aneurysm)   Elevated troponin  Bilateral Lower Extremity Weakness  Generalized Weakness Unclear etiology, before Friday, up and walking with walker - after Friday, wasn't able to  Granddaughter noted incontinence of bowel as well as numbness in legs as well Weakness could be from neurologic causes vs rhabdo/myositis 11/9 mri with cervical spinal canal stenosis and neural foraminal narrowing-see reports. 11/11 continue with lidocaine patch F/u with pcp as outpt PT/OT when more stable    AKI  Hyperkalemia UA with hg, but no RBC Elevated LFT's Suspect rhabdo (urine myoglobin positive) 11/9 renal US suggesting med-renal dz.  No hydron. Prostatomegaly. Continue ivf -caution with h/xo chf Ck rising K 6.0, ekg this am no peaked T waves Give kayxelate 30gm.  Give 10 units insulin 11/10 creatinine worsening, K down mildly Will give another dose of kayxelate Nephrology following Renal ultrasound without hydronephrosis please see full report 11/11 cr mildly down. CK trending down Unfortunately unable to continue IV fluids as he is more short of breath and volume overloaded.  Spoke to nephrology will hold IV fluids at this time.  Give Lasix 40 mg IV x1. Monitor labs    Rhabdomyolysis UA Heme + and only 0-5 RBC's He's on amlodipine/simvastatin -> increases risk of rhabdo (sounds like he's been on this combination for a while -> regimen should be adjusted at discharge) - continue to hold  TSH elevated mildly, follow free T4 11/10-ck trended significantly higher, 18000. Tea colored urine CRP elevated 11/11 rheumatology was consulted for possible myositis.  Appreciate their input.  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 Will order this lab Ck trending down Have to dc ivf and give lasix due to increase 02 requirement .    Elevated LFT's 2/2 rhabdo  Negative acute hepatitis panel 11/9 abdominal ultrasound with cholelithiasis and findings suspicious for acute cholecystitis.  Dilated common bile duct suggestive of obstructing common bile duct stone.  MRI abdomen and MRCP can be helpful. Right pleural effusion 11/10 HIDA negative. GI/Surgery following Avoid nephrotoxic meds 11/11plan for MRCP to r/o retained CBD stone. If positive needs ERCP.   Acute on  Chronic combined systolic and diastolic HF EF 02-40%, global hypokinesis 04/2021, grade II diastolic dysfunction, moderately reduced RVSF  11/11 volume overloaded, requiring increased 02 now.  Will give lasix 40mg  iv x1 Hold ivf for now I/o Monitor wt,  UO       Lumbar Stenosis with Radiculopathy Lumbar imaging obtained with concern of LE weakness + incontinence of stool C/T spine imaging obtained with concern for UE pain, weakness as well - follow with neurology NSGY rec considering decompression when medically stable Preoperative clearance per cardiology - high risk surgical candidate (see 11/8 note)    Elevated D dimer  RUE Swelling No evidence of DVT in RUE VQ scan pending -> indeterminate LE [place] - negative for DVT Low suspicion for PE based on his overall picture - will wean O2, may need additional w/u if difficulty weaning   Right Pleural Effusion Will need follow up imaging  CAD with hx CABG  Elevated Troponin Aspirin, plavix still being held in setting of GI bleed (was supposed to hold until reeval) No CP, appreciate cardiology assistance - suspect demand Appreciate cardiology assistance  Sinus Bradycardia Currently metoprolol on hold HR as low as 40's documented outpatient  Appreciate cardiology recs (see 11/8 note, signing off) Concern that this may contribute to weakness, appreciate cardiology assistance  Hypertension Dc amlodipine as bp on nml-low side Holding benicar and HCTZ  HLD Holding simvastatin given elevated CK and on amlodipine, if resuming statin at discharge should resume alternative statin or simvastatin at lower dose (after CK, LFT's improved)  GERD  Hx GI Bleed  Reflux Esophagitis  Gastric Ulcer  Erosive Gastropathy  Duodenal Ulcers PPI BID  DVT prophylaxis: lovenox Code Status: full Family Communication: grandaughter at bedside Disposition:   Status is: Inpatient  Remains inpatient appropriate because: needs IV treatment, in acute HF< worsening renal failure, ck elevated. Pt is sick and will transfer to PCU for higher level of care/monitoring      Consultants:  Renal Cardiology GI, surgery rheumatology  Procedures No DVT in RUE  Antimicrobials:  Anti-infectives  (From admission, onward)    Start     Dose/Rate Route Frequency Ordered Stop   08/14/21 1800  piperacillin-tazobactam (ZOSYN) IVPB 2.25 g        2.25 g 100 mL/hr over 30 Minutes Intravenous Every 6 hours 08/14/21 1648            Subjective: Some sob , no cp, no abd pain  Objective: Vitals:   08/15/21 1947 08/15/21 2332 08/16/21 0441 08/16/21 0729  BP: (!) 124/56 (!) 145/53 (!) 103/37 (!) 117/43  Pulse: 65 65 67 71  Resp: 18 18 20 20   Temp: (!) 97.5 F (36.4 C) 98.5 F (36.9 C) 99 F (37.2 C) 99.1 F (37.3 C)  TempSrc: Oral Oral Oral Oral  SpO2: 91% 93% 91% 92%  Weight:      Height:        Intake/Output Summary (Last 24 hours) at 08/16/2021 0807 Last data filed at 08/16/2021 0641 Gross per 24 hour  Intake 1463.53 ml  Output 1400 ml  Net 63.53 ml   Filed Weights   08/11/21 1921  Weight: 59.9 kg    Examination: Appears ill and tired +JVD Decrease bs, no wheezing Reg s1/s2 no gallop Soft benign +bs +edema x4   Data Reviewed: I have personally reviewed following labs and imaging studies  CBC: Recent Labs  Lab 08/11/21 1930 08/12/21 0648 08/13/21 0526 08/14/21 0553  WBC 8.7 7.7 9.4 11.5*  NEUTROABS  --   --  7.9* 9.6*  HGB 9.6* 9.5* 9.0* 9.3*  HCT 29.7* 29.0* 27.3* 28.4*  MCV 84.6 85.8 85.6 84.5  PLT 190 190 196 182    Basic Metabolic Panel: Recent Labs  Lab 08/11/21 1930 08/12/21 0648 08/13/21 0526 08/14/21 0553 08/15/21 0423  NA 130* 134* 134* 131* 135  K 5.7* 5.0 5.2* 6.0* 5.7*  CL 97* 102 105 103 107  CO2 23 22 20* 18* 17*  GLUCOSE 100* 79 94 113* 83  BUN 118* 124* 118* 118* 68*  CREATININE 3.42* 3.26* 2.76* 3.01* 3.42*  CALCIUM 8.8* 8.8* 8.3* 8.3* 8.1*  MG  --   --  2.3 2.4  --   PHOS  --   --  6.7* 7.5*  --     GFR: Estimated Creatinine Clearance: 11.7 mL/min (A) (by C-G formula based on SCr of 3.42 mg/dL (H)).  Liver Function Tests: Recent Labs  Lab 08/12/21 0006 08/13/21 0526 08/14/21 0553 08/15/21 0423  AST 1,049*  906* 994* 798*  ALT 494* 448* 513* 458*  ALKPHOS 163* 167* 183* 143*  BILITOT 0.9 0.7 0.7 0.9  PROT 6.7 5.7* 6.3* 5.5*  ALBUMIN 2.6* 2.1* 2.4* 2.1*    CBG: Recent Labs  Lab 08/11/21 1938  GLUCAP 95     Recent Results (from the past 240 hour(s))  Resp Panel by RT-PCR (Flu A&B, Covid) Nasopharyngeal Swab     Status: None   Collection Time: 08/12/21 12:03 AM   Specimen: Nasopharyngeal Swab; Nasopharyngeal(NP) swabs in vial transport medium  Result Value Ref Range Status   SARS Coronavirus 2 by RT PCR NEGATIVE NEGATIVE Final    Comment: (NOTE) SARS-CoV-2 target nucleic acids are NOT DETECTED.  The SARS-CoV-2 RNA is generally detectable in upper respiratory specimens during the acute phase of infection. The lowest concentration of SARS-CoV-2 viral copies this assay can detect is 138 copies/mL. A negative result does not preclude SARS-Cov-2 infection and should not be used as the sole basis for treatment or other patient management decisions. A negative result may occur with  improper specimen collection/handling, submission of specimen other than nasopharyngeal swab, presence of viral mutation(s) within the areas targeted by this assay, and inadequate number of viral copies(<138 copies/mL). A negative result must be combined with clinical observations, patient history, and epidemiological information. The expected result is Negative.  Fact Sheet for Patients:  EntrepreneurPulse.com.au  Fact Sheet for Healthcare Providers:  IncredibleEmployment.be  This test is no t yet approved or cleared by the [place] FDA and  has been authorized for detection and/or diagnosis of SARS-CoV-2 by FDA under an Emergency Use Authorization (EUA). This EUA will remain  in effect (meaning this test can be used) for the duration of the COVID-19 declaration under Section 564(b)(1) of the Act, 21 U.S.C.section 360bbb-3(b)(1), unless the authorization is  terminated  or revoked sooner.       Influenza A by PCR NEGATIVE NEGATIVE Final   Influenza B by PCR NEGATIVE NEGATIVE Final    Comment: (NOTE) The Xpert Xpress SARS-CoV-2/FLU/RSV plus assay is intended as an aid in the diagnosis of influenza from Nasopharyngeal swab specimens and should not be used as a sole basis for treatment. Nasal washings and aspirates are unacceptable for Xpert Xpress SARS-CoV-2/FLU/RSV testing.  Fact Sheet for Patients: EntrepreneurPulse.com.au  Fact Sheet for Healthcare Providers: IncredibleEmployment.be  This test is not yet approved or cleared by the [place] FDA and has been authorized for detection and/or diagnosis of SARS-CoV-2 by FDA under an Emergency Use Authorization (EUA). This  EUA will remain in effect (meaning this test can be used) for the duration of the COVID-19 declaration under Section 564(b)(1) of the Act, 21 U.S.C. section 360bbb-3(b)(1), unless the authorization is terminated or revoked.  Performed at [facility], [address], [place], [place] [address]          Radiology Studies: NM Hepatobiliary Liver Func  Result Date: 08/15/2021 CLINICAL DATA:  Abdominal pain.  Question cholecystitis. EXAM: NUCLEAR MEDICINE HEPATOBILIARY IMAGING TECHNIQUE: Sequential images of the abdomen were obtained. Gallbladder activity was not confidently identified after 45 minutes. Therefore, the patient was given 2 mg of IV morphine and follow-up imaging was obtained. RADIOPHARMACEUTICALS:  4.73 mCi Tc-57m  Choletec IV COMPARISON:  Ultrasound 08/14/2021 FINDINGS: Prompt uptake and biliary excretion of activity by the liver. After 45 minutes, there was activity in the small bowel but no definite gallbladder uptake. Following the administration of IV morphine, there was uptake in the gallbladder. Gallbladder ejection fraction was not calculated. IMPRESSION: Gallbladder uptake following the  administration of IV morphine. Cystic duct is patent. Electronically Signed   By: Markus Daft M.D.   On: 08/15/2021 11:15   US RENAL  Result Date: 08/14/2021 CLINICAL DATA:  Renal failure EXAM: RENAL / URINARY TRACT ULTRASOUND COMPLETE COMPARISON:  Renal ultrasound 08/12/2021 FINDINGS: Right Kidney: Renal measurements: 8.8 x 4.2 x 4.4 cm = volume: 85 mL. Increased echogenicity with no hydronephrosis. Left Kidney: Renal measurements: 8.3 x 3.8 x 3.5 cm = volume: 57 mL. Mildly increased echogenicity. No hydronephrosis. Bladder: Appears normal for degree of bladder distention. Other: Prostate gland is enlarged with nodular protrusion into the base of the urinary bladder. Right pleural effusion. IMPRESSION: 1. Increased echogenicity of the kidneys suggesting medical renal disease. No hydronephrosis. 2. Prostatomegaly. 3. Right pleural effusion. Electronically Signed   By: Ofilia Neas M.D.   On: 08/14/2021 12:07        Scheduled Meds:  feeding supplement  237 mL Oral BID BM   heparin injection (subcutaneous)  5,000 Units Subcutaneous Q8H   lidocaine  1 patch Transdermal Q24H   pantoprazole  40 mg Oral BID   patiromer  16.8 g Oral Daily   Continuous Infusions:  piperacillin-tazobactam (ZOSYN)  IV 2.25 g (08/16/21 0443)   sodium bicarbonate 150 mEq in D5W infusion 75 mL/hr at 08/16/21 0556     LOS: 4 days    Time spent: 35 minutes with more than 50% on COC    Nolberto Hanlon, MD [facility]   To contact the attending provider between 7A-7P or the covering provider during after hours 7P-7A, please log into the web site [email or web address] and access using universal [place] password for that web site. If you do not have the password, please call the hospital operator.  08/16/2021, 8:07 AM

## 2021-08-16 NOTE — Consult Note (Signed)
Rheumatology follow-up CPK down to 9000 Reasonable urine output No significant change. Discussed with daughter.  No need for muscle biopsy. Discussed with Dr. Kurtis Bushman. Please draw anti-HMGCR antibodies No statins in the future.

## 2021-08-17 ENCOUNTER — Inpatient Hospital Stay: Payer: Medicare HMO

## 2021-08-17 DIAGNOSIS — I10 Essential (primary) hypertension: Secondary | ICD-10-CM | POA: Diagnosis not present

## 2021-08-17 DIAGNOSIS — N179 Acute kidney failure, unspecified: Secondary | ICD-10-CM | POA: Diagnosis not present

## 2021-08-17 DIAGNOSIS — I5043 Acute on chronic combined systolic (congestive) and diastolic (congestive) heart failure: Secondary | ICD-10-CM | POA: Diagnosis not present

## 2021-08-17 DIAGNOSIS — I2510 Atherosclerotic heart disease of native coronary artery without angina pectoris: Secondary | ICD-10-CM | POA: Diagnosis not present

## 2021-08-17 LAB — COMPREHENSIVE METABOLIC PANEL
ALT: 416 U/L — ABNORMAL HIGH (ref 0–44)
AST: 415 U/L — ABNORMAL HIGH (ref 15–41)
Albumin: 2 g/dL — ABNORMAL LOW (ref 3.5–5.0)
Alkaline Phosphatase: 196 U/L — ABNORMAL HIGH (ref 38–126)
Anion gap: 14 (ref 5–15)
BUN: 118 mg/dL — ABNORMAL HIGH (ref 8–23)
CO2: 20 mmol/L — ABNORMAL LOW (ref 22–32)
Calcium: 7.7 mg/dL — ABNORMAL LOW (ref 8.9–10.3)
Chloride: 105 mmol/L (ref 98–111)
Creatinine, Ser: 3.33 mg/dL — ABNORMAL HIGH (ref 0.61–1.24)
GFR, Estimated: 17 mL/min — ABNORMAL LOW (ref >60)
Glucose, Bld: 93 mg/dL (ref 70–99)
Potassium: 4 mmol/L (ref 3.5–5.1)
Sodium: 139 mmol/L (ref 135–145)
Total Bilirubin: 1.7 mg/dL — ABNORMAL HIGH (ref 0.3–1.2)
Total Protein: 5.9 g/dL — ABNORMAL LOW (ref 6.5–8.1)

## 2021-08-17 LAB — CK: Total CK: 4493 U/L — ABNORMAL HIGH (ref 49–397)

## 2021-08-17 MED ORDER — FUROSEMIDE 10 MG/ML IJ SOLN
20.0000 mg | Freq: Two times a day (BID) | INTRAMUSCULAR | Status: AC
  Administered 2021-08-17 (×2): 20 mg via INTRAVENOUS
  Filled 2021-08-17 (×2): qty 2

## 2021-08-17 NOTE — Progress Notes (Signed)
PROGRESS NOTE    Daniel Bolton  XLK:[id number] DOB: August 25, 1934 DOA: 08/11/2021 PCP: [name], MD   Chief Complaint  Patient presents with   Weakness    Brief Narrative: 87 y.o. Hispanic male with medical history significant for essential hypertension, CAD status post CABG, CHF, chronic kidney disease and CVA, who presented to the emergency room with acute onset   generalized weakness since Friday with diminished appetite and oral intake.  He's been found to have AKI as well as generalized weakness.  Being treated for rhabdo with IVF (caution given hx HF).  Other potential causes for his weakness being evaluated as noted below.    11/9 family at bedside. No overnight issues. 11/10 reports no abd pain/n/v. HIDA negative 11/11 on 2 L  then more sob, requiring 8L. Spoke to nephrology, recommended lasix 40mg  iv x1, will dc ivf. If UO starts dropping will need to initial HD. 11/12 pt reports less sob, still on 8L [place]   Assessment & Plan:   Active Problems:   Moderate major depression, single episode (HCC)   Coronary artery disease   AKI (acute kidney injury) ([place])   Normochromic normocytic anemia   Essential hypertension   Hyponatremia   Acute on chronic combined systolic and diastolic CHF (congestive heart failure) (HCC)   HLD (hyperlipidemia)   Hyperkalemia   AAA (abdominal aortic aneurysm)   Elevated troponin    AKI  Hyperkalemia UA with hg, but no RBC Elevated LFT's Suspect rhabdo (urine myoglobin positive) 11/9 renal US suggesting med-renal dz. No hydron. Prostatomegaly. Continue ivf -caution with h/xo chf Ck rising K 6.0, ekg this am no peaked T waves Give kayxelate 30gm.  Give 10 units insulin 11/10 creatinine worsening, K down mildly Will give another dose of kayxelate Nephrology following Renal ultrasound without hydronephrosis please see full report 11/12 creatinine hanging in same place  CK trending down  Unfortunately cannot give IV fluids  due to his cardiomyopathy volume overload and becoming short of breath.  Currently on 8 L  Gave Lasix 40 IV yesterday and creatinine still remains about the same without worsening  Continue to monitor  Avoid nephrotoxic meds    Bilateral Lower Extremity Weakness  Generalized Weakness Unclear etiology, before Friday, up and walking with walker - after Friday, wasn't able to  Granddaughter noted incontinence of bowel as well as numbness in legs as well Weakness could be from neurologic causes vs rhabdo/myositis 11/9 mri with cervical spinal canal stenosis and neural foraminal narrowing-see reports. 1/12 continue lidocaine patch  PT OT when more stable  Follow-up with PCP as outpatient          Rhabdomyolysis UA Heme + and only 0-5 RBC's He's on amlodipine/simvastatin -> increases risk of rhabdo (sounds like he's been on this combination for a while -> regimen should be adjusted at discharge) - continue to hold  TSH elevated mildly, follow free T4 11/10-ck trended significantly higher, 18000. Tea colored urine CRP elevated 11/11 rheumatology was consulted for possible myositis.  Appreciate their input.  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 Will order this lab 11/12 CK trending down Continue to monitor. Off IV fluids due to volume overload    Elevated LFT's 2/2 rhabdo  Negative acute hepatitis panel 11/9 abdominal ultrasound with cholelithiasis and findings suspicious for acute cholecystitis.  Dilated common bile duct suggestive of obstructing common bile duct stone.  MRI abdomen and MRCP can  be helpful. Right pleural effusion 11/10 HIDA negative. GI/Surgery following Avoid nephrotoxic meds 11/11plan for MRCP to r/o retained CBD stone. If positive needs ERCP. 11/12 MRCP today LFTs trending down We will follow-up on results Continue zosyn   Acute on Chronic  combined systolic and diastolic HF EF 23-55%, global hypokinesis 04/2021, grade II diastolic dysfunction, moderately reduced RVSF  11/12 still volume overloaded  Was given Lasix 40 IV x1 yesterday.  Still on 8 L.   Spoke to nephrology okay to give another Lasix 40 IV x1  I's and O's  Daily weight      Lumbar Stenosis with Radiculopathy Lumbar imaging obtained with concern of LE weakness + incontinence of stool C/T spine imaging obtained with concern for UE pain, weakness as well - follow with neurology NSGY rec considering decompression when medically stable Preoperative clearance per cardiology - high risk surgical candidate (see 11/8 note)    Elevated D dimer  RUE Swelling No evidence of DVT in RUE VQ scan pending -> indeterminate LE [place] - negative for DVT Low suspicion for PE based on his overall picture -  Right Pleural Effusion Will need follow up imaging  CAD with hx CABG  Elevated Troponin Aspirin, plavix still being held in setting of GI bleed (was supposed to hold until reeval) No CP, appreciate cardiology assistance - suspect demand Appreciate cardiology assistance  Sinus Bradycardia Currently metoprolol on hold HR as low as 40's documented outpatient  Appreciate cardiology recs (see 11/8 note, signing off) Concern that this may contribute to weakness, appreciate cardiology assistance  Hypertension Dc amlodipine as bp on nml-low side Holding benicar and HCTZ  HLD Holding simvastatin given elevated CK and on amlodipine, if resuming statin at discharge should resume alternative statin or simvastatin at lower dose (after CK, LFT's improved)  GERD  Hx GI Bleed  Reflux Esophagitis  Gastric Ulcer  Erosive Gastropathy  Duodenal Ulcers PPI BID  DVT prophylaxis: lovenox Code Status: full Family Communication: family at bedside Disposition:   Status is: Inpatient  Remains inpatient appropriate because: needs IV treatment, in acute HF< worsening renal  failure, ck elevated. Pt is sick and will transfer to PCU for higher level of care/monitoring      Consultants:  Renal Cardiology GI, surgery rheumatology  Procedures No DVT in RUE  Antimicrobials:  Anti-infectives (From admission, onward)    Start     Dose/Rate Route Frequency Ordered Stop   08/14/21 1800  piperacillin-tazobactam (ZOSYN) IVPB 2.25 g        2.25 g 100 mL/hr over 30 Minutes Intravenous Every 6 hours 08/14/21 1648            Subjective: Less sob than yesterday. No cp or abd pain  Objective: Vitals:   08/16/21 1150 08/16/21 1544 08/16/21 2051 08/17/21 0750  BP:  (!) 112/54 (!) 101/51 (!) 132/55  Pulse:  66 68 77  Resp:  18  18  Temp:  98.1 F (36.7 C) 98.6 F (37 C) 98.3 F (36.8 C)  TempSrc:   Oral Oral  SpO2: 90% 95% 93% (!) 88%  Weight:      Height:        Intake/Output Summary (Last 24 hours) at 08/17/2021 0854 Last data filed at 08/17/2021 0100 Gross per 24 hour  Intake 436.78 ml  Output 600 ml  Net -163.22 ml   Filed Weights   08/11/21 1921  Weight: 59.9 kg    Examination: Appears tired, nad +JVD Decrease bs and crackles b/l Regular s1/s2  no gallop Soft benign +Bs +edema Awakens and interactive.  Mood and affect appropriate in current setting   Data Reviewed: I have personally reviewed following labs and imaging studies  CBC: Recent Labs  Lab 08/11/21 1930 08/12/21 0648 08/13/21 0526 08/14/21 0553  WBC 8.7 7.7 9.4 11.5*  NEUTROABS  --   --  7.9* 9.6*  HGB 9.6* 9.5* 9.0* 9.3*  HCT 29.7* 29.0* 27.3* 28.4*  MCV 84.6 85.8 85.6 84.5  PLT 190 190 196 381    Basic Metabolic Panel: Recent Labs  Lab 08/13/21 0526 08/14/21 0553 08/15/21 0423 08/16/21 0633 08/17/21 0638  NA 134* 131* 135 136 139  K 5.2* 6.0* 5.7* 4.9 4.0  CL 105 103 107 105 105  CO2 20* 18* 17* 22 20*  GLUCOSE 94 113* 83 137* 93  BUN 118* 118* 68* 113* 118*  CREATININE 2.76* 3.01* 3.42* 3.28* 3.33*  CALCIUM 8.3* 8.3* 8.1* 8.0* 7.7*  MG 2.3  2.4  --   --   --   PHOS 6.7* 7.5*  --   --   --     GFR: Estimated Creatinine Clearance: 12 mL/min (A) (by C-G formula based on SCr of 3.33 mg/dL (H)).  Liver Function Tests: Recent Labs  Lab 08/13/21 0526 08/14/21 0553 08/15/21 0423 08/16/21 0633 08/17/21 0638  AST 906* 994* 798* 544* 415*  ALT 448* 513* 458* 407* 416*  ALKPHOS 167* 183* 143* 170* 196*  BILITOT 0.7 0.7 0.9 1.1 1.7*  PROT 5.7* 6.3* 5.5* 5.7* 5.9*  ALBUMIN 2.1* 2.4* 2.1* 1.8* 2.0*    CBG: Recent Labs  Lab 08/11/21 1938  GLUCAP 95     Recent Results (from the past 240 hour(s))  Resp Panel by RT-PCR (Flu A&B, Covid) Nasopharyngeal Swab     Status: None   Collection Time: 08/12/21 12:03 AM   Specimen: Nasopharyngeal Swab; Nasopharyngeal(NP) swabs in vial transport medium  Result Value Ref Range Status   SARS Coronavirus 2 by RT PCR NEGATIVE NEGATIVE Final    Comment: (NOTE) SARS-CoV-2 target nucleic acids are NOT DETECTED.  The SARS-CoV-2 RNA is generally detectable in upper respiratory specimens during the acute phase of infection. The lowest concentration of SARS-CoV-2 viral copies this assay can detect is 138 copies/mL. A negative result does not preclude SARS-Cov-2 infection and should not be used as the sole basis for treatment or other patient management decisions. A negative result may occur with  improper specimen collection/handling, submission of specimen other than nasopharyngeal swab, presence of viral mutation(s) within the areas targeted by this assay, and inadequate number of viral copies(<138 copies/mL). A negative result must be combined with clinical observations, patient history, and epidemiological information. The expected result is Negative.  Fact Sheet for Patients:  EntrepreneurPulse.com.au  Fact Sheet for Healthcare Providers:  IncredibleEmployment.be  This test is no t yet approved or cleared by the [place] FDA and  has been  authorized for detection and/or diagnosis of SARS-CoV-2 by FDA under an Emergency Use Authorization (EUA). This EUA will remain  in effect (meaning this test can be used) for the duration of the COVID-19 declaration under Section 564(b)(1) of the Act, 21 U.S.C.section 360bbb-3(b)(1), unless the authorization is terminated  or revoked sooner.       Influenza A by PCR NEGATIVE NEGATIVE Final   Influenza B by PCR NEGATIVE NEGATIVE Final    Comment: (NOTE) The Xpert Xpress SARS-CoV-2/FLU/RSV plus assay is intended as an aid in the diagnosis of influenza from Nasopharyngeal swab specimens and should  not be used as a sole basis for treatment. Nasal washings and aspirates are unacceptable for Xpert Xpress SARS-CoV-2/FLU/RSV testing.  Fact Sheet for Patients: EntrepreneurPulse.com.au  Fact Sheet for Healthcare Providers: IncredibleEmployment.be  This test is not yet approved or cleared by the [place] FDA and has been authorized for detection and/or diagnosis of SARS-CoV-2 by FDA under an Emergency Use Authorization (EUA). This EUA will remain in effect (meaning this test can be used) for the duration of the COVID-19 declaration under Section 564(b)(1) of the Act, 21 U.S.C. section 360bbb-3(b)(1), unless the authorization is terminated or revoked.  Performed at [facility], [address], [place], [place] [address]          Radiology Studies: NM Hepatobiliary Liver Func  Result Date: 08/15/2021 CLINICAL DATA:  Abdominal pain.  Question cholecystitis. EXAM: NUCLEAR MEDICINE HEPATOBILIARY IMAGING TECHNIQUE: Sequential images of the abdomen were obtained. Gallbladder activity was not confidently identified after 45 minutes. Therefore, the patient was given 2 mg of IV morphine and follow-up imaging was obtained. RADIOPHARMACEUTICALS:  4.73 mCi Tc-41m  Choletec IV COMPARISON:  Ultrasound 08/14/2021 FINDINGS: Prompt uptake and biliary  excretion of activity by the liver. After 45 minutes, there was activity in the small bowel but no definite gallbladder uptake. Following the administration of IV morphine, there was uptake in the gallbladder. Gallbladder ejection fraction was not calculated. IMPRESSION: Gallbladder uptake following the administration of IV morphine. Cystic duct is patent. Electronically Signed   By: Markus Daft M.D.   On: 08/15/2021 11:15        Scheduled Meds:  feeding supplement  237 mL Oral BID BM   heparin injection (subcutaneous)  5,000 Units Subcutaneous Q8H   lidocaine  1 patch Transdermal Q24H   pantoprazole  40 mg Oral BID   patiromer  16.8 g Oral Daily   polyethylene glycol  17 g Oral Daily   senna-docusate  1 tablet Oral BID   Continuous Infusions:  piperacillin-tazobactam (ZOSYN)  IV 2.25 g (08/17/21 0600)     LOS: 5 days    Time spent: 35 minutes with more than 50% on COC    Nolberto Hanlon, MD [facility]   To contact the attending provider between 7A-7P or the covering provider during after hours 7P-7A, please log into the web site [email or web address] and access using universal [place] password for that web site. If you do not have the password, please call the hospital operator.  08/17/2021, 8:54 AM

## 2021-08-17 NOTE — Progress Notes (Signed)
[facility]  PROGRESS NOTE   Subjective:   Patient seen at bedside family is in attendance. Complains of shortness of breath.  Objective:  Vital signs in last 24 hours:  Temp:  [98.3 F (36.8 C)-98.6 F (37 C)] 98.3 F (36.8 C) (11/12 0750) Pulse Rate:  [68-77] 77 (11/12 0750) Resp:  [18] 18 (11/12 0750) BP: (101-132)/(51-55) 132/55 (11/12 0750) SpO2:  [88 %-93 %] 90 % (11/12 1500)  Weight change:  Filed Weights   08/11/21 1921  Weight: 59.9 kg    Intake/Output: I/O last 3 completed shifts: In: 1900.3 [P.O.:100; I.V.:1550.3; IV Piggyback:250] Out: 1250 [Urine:1250]   Intake/Output this shift:  Total I/O In: 120 [P.O.:120] Out: 400 [Urine:400]  Physical Exam: General:  No acute distress  Head:  Normocephalic, atraumatic. Moist oral mucosal membranes  Eyes:  Anicteric  Neck:  Supple  Lungs:  Decreased breath sound bilaterally.  Heart:  S1S2 no rubs  Abdomen:   Soft, nontender, bowel sounds present  Extremities:  peripheral edema.  Neurologic:  Awake, alert, following commands  Skin:  No lesions  Access:     Basic Metabolic Panel: Recent Labs  Lab 08/13/21 0526 08/14/21 0553 08/15/21 0423 08/16/21 0633 08/17/21 0638  NA 134* 131* 135 136 139  K 5.2* 6.0* 5.7* 4.9 4.0  CL 105 103 107 105 105  CO2 20* 18* 17* 22 20*  GLUCOSE 94 113* 83 137* 93  BUN 118* 118* 68* 113* 118*  CREATININE 2.76* 3.01* 3.42* 3.28* 3.33*  CALCIUM 8.3* 8.3* 8.1* 8.0* 7.7*  MG 2.3 2.4  --   --   --   PHOS 6.7* 7.5*  --   --   --     CBC: Recent Labs  Lab 08/11/21 1930 08/12/21 0648 08/13/21 0526 08/14/21 0553  WBC 8.7 7.7 9.4 11.5*  NEUTROABS  --   --  7.9* 9.6*  HGB 9.6* 9.5* 9.0* 9.3*  HCT 29.7* 29.0* 27.3* 28.4*  MCV 84.6 85.8 85.6 84.5  PLT 190 190 196 223     Urinalysis: No results for input(s): COLORURINE, LABSPEC, PHURINE, GLUCOSEU, HGBUR, BILIRUBINUR, KETONESUR, PROTEINUR, UROBILINOGEN, NITRITE, LEUKOCYTESUR in the last 72 hours.  Invalid  input(s): APPERANCEUR    Imaging: MR ABDOMEN MRCP WO CONTRAST  Result Date: 08/17/2021 CLINICAL DATA:  87 year old male with history of elevated liver function tests. Elevated alkaline phosphatase level. EXAM: MRI ABDOMEN WITHOUT CONTRAST  (INCLUDING MRCP) TECHNIQUE: Multiplanar multisequence MR imaging of the abdomen was performed. Heavily T2-weighted images of the biliary and pancreatic ducts were obtained, and three-dimensional MRCP images were rendered by post processing. COMPARISON:  No prior abdominal MRI. CT of the abdomen and pelvis 06/04/2021. Abdominal ultrasound 08/14/2021. Hepatobiliary scan 08/15/2021. FINDINGS: Comment: Portions of today's study is limited by considerable patient motion. Additionally, study is limited for detection and characterization of visceral and/or vascular lesions by lack of IV gadolinium. Lower chest: Large right and small left pleural effusions. Cardiomegaly. Hepatobiliary: No definite suspicious cystic or solid hepatic lesions are confidently identified on today's noncontrast examination. Filling defect in the gallbladder, compatible with known gallstone. Gallbladder is moderately distended. Gallbladder wall thickness is normal. No pericholecystic fluid. No intra or extrahepatic biliary ductal dilatation noted on MRCP images. Common bile duct measures 7 mm within the porta hepatis (this is within normal limits for the patient's advanced age). Pancreas: No definite pancreatic mass or peripancreatic fluid collections or inflammatory changes noted on today's noncontrast examination. No pancreatic ductal dilatation noted on MRCP images. Spleen:  Unremarkable. Adrenals/Urinary  Tract: Bilateral kidneys and adrenal glands are unremarkable in appearance. No hydroureteronephrosis in the visualized portions of the abdomen. Stomach/Bowel: Visualized portions are unremarkable. Vascular/Lymphatic: Extensive aortic atherosclerosis with infrarenal abdominal aortic aneurysm measuring  4.3 x 4.0 cm. No definite lymphadenopathy confidently identified in the abdomen on today's noncontrast examination. Other:  Trace volume of ascites. Musculoskeletal: No aggressive appearing osseous lesions are noted in the visualized portions of the skeleton. IMPRESSION: 1. Cholelithiasis without evidence of acute cholecystitis. 2. No evidence of choledocholithiasis or findings to suggest biliary tract obstruction. 3. Trace volume of ascites. 4. Large right and small left pleural effusions. 5. Cardiomegaly. Electronically Signed   By: Vinnie Langton M.D.   On: 08/17/2021 08:53     Medications:    piperacillin-tazobactam (ZOSYN)  IV 2.25 g (08/17/21 1143)    feeding supplement  237 mL Oral BID BM   furosemide  20 mg Intravenous BID   heparin injection (subcutaneous)  5,000 Units Subcutaneous Q8H   lidocaine  1 patch Transdermal Q24H   pantoprazole  40 mg Oral BID   patiromer  16.8 g Oral Daily   polyethylene glycol  17 g Oral Daily   senna-docusate  1 tablet Oral BID    Assessment/ Plan:     Active Problems:   Moderate major depression, single episode (HCC)   Coronary artery disease   AKI (acute kidney injury) ([place])   Normochromic normocytic anemia   Essential hypertension   Hyponatremia   Acute on chronic combined systolic and diastolic CHF (congestive heart failure) (HCC)   HLD (hyperlipidemia)   Hyperkalemia   AAA (abdominal aortic aneurysm)   Elevated troponin  87 year old with history of hypertension, coronary artery disease, CABG, congestive heart failure, cerebrovascular accident, chronic kidney disease now admitted with history of increased leg swelling, generalized weakness and shortness of breath.  #1: Acute kidney injury on the top of chronic kidney disease: Acute kidney injury is probably secondary to ATN due to decreased cardiac output and/or due to rhabdomyolysis..  We will continue to monitor closely.  If renal indicis does not improve he may need renal replacement  therapy.  #2: Congestive heart failure: Continue fluid restriction.  We will give Lasix 20 mg twice a day with additional doses as needed.  Agree to stop the IV fluids.  #3: Hypekalemia: Presently improved with Veltassa.  #4: Anemia: This is possibly secondary to chronic kidney disease.  #5: Rhabdomyolysis: CPK levels are improving.  Possibly secondary to statins.  Spoke to the family at bedside.  Will monitor closely.   LOS: [place] 11/12/20224:18 PM

## 2021-08-17 NOTE — Progress Notes (Signed)
Pharmacy Antibiotic Note  Daniel Bolton is a 87 y.o. male with medical history significant for essential hypertension, CAD status post CABG, CHF, chronic kidney disease and CVA, who presented to the emergency room with acute onset generalized weakness and was admitted on 08/11/2021. Pharmacy has been consulted for Zosyn dosing for intra-abdominal infection. Pt with AKI; nephro following - no acute need for dialysis.  Plan: Day 3-  Zosyn 2.25 g IV q6h for Crcl 12 ml/min Monitor renal function and adjust dose as clinically indicated  Height: 5\' 1"  (154.9 cm) Weight: 59.9 kg (132 lb) IBW/kg (Calculated) : 52.3  Temp (24hrs), Avg:98.4 F (36.9 C), Min:98.1 F (36.7 C), Max:98.7 F (37.1 C)  Recent Labs  Lab 08/11/21 1930 08/12/21 0648 08/13/21 0526 08/14/21 0553 08/15/21 0423 08/16/21 0633 08/17/21 0638  WBC 8.7 7.7 9.4 11.5*  --   --   --   CREATININE 3.42* 3.26* 2.76* 3.01* 3.42* 3.28* 3.33*     Estimated Creatinine Clearance: 12 mL/min (A) (by C-G formula based on SCr of 3.33 mg/dL (H)).    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    Antimicrobials this admission: 11/9(evening)Zosyn >>    Thank you for allowing pharmacy to be a part of this patient's care.  Raeghan Demeter A 08/17/2021 8:30 AM

## 2021-08-18 DIAGNOSIS — R778 Other specified abnormalities of plasma proteins: Secondary | ICD-10-CM | POA: Diagnosis not present

## 2021-08-18 DIAGNOSIS — E876 Hypokalemia: Secondary | ICD-10-CM

## 2021-08-18 DIAGNOSIS — N179 Acute kidney failure, unspecified: Secondary | ICD-10-CM | POA: Diagnosis not present

## 2021-08-18 DIAGNOSIS — I5043 Acute on chronic combined systolic (congestive) and diastolic (congestive) heart failure: Secondary | ICD-10-CM | POA: Diagnosis not present

## 2021-08-18 DIAGNOSIS — I2510 Atherosclerotic heart disease of native coronary artery without angina pectoris: Secondary | ICD-10-CM | POA: Diagnosis not present

## 2021-08-18 LAB — CBC
HCT: 25.2 % — ABNORMAL LOW (ref 39.0–52.0)
Hemoglobin: 8.2 g/dL — ABNORMAL LOW (ref 13.0–17.0)
MCH: 27.6 pg (ref 26.0–34.0)
MCHC: 32.5 g/dL (ref 30.0–36.0)
MCV: 84.8 fL (ref 80.0–100.0)
Platelets: 234 10*3/uL (ref 150–400)
RBC: 2.97 MIL/uL — ABNORMAL LOW (ref 4.22–5.81)
RDW: 16.6 % — ABNORMAL HIGH (ref 11.5–15.5)
WBC: 13.6 10*3/uL — ABNORMAL HIGH (ref 4.0–10.5)
nRBC: 0 % (ref 0.0–0.2)

## 2021-08-18 LAB — COMPREHENSIVE METABOLIC PANEL
ALT: 356 U/L — ABNORMAL HIGH (ref 0–44)
AST: 271 U/L — ABNORMAL HIGH (ref 15–41)
Albumin: 2.1 g/dL — ABNORMAL LOW (ref 3.5–5.0)
Alkaline Phosphatase: 202 U/L — ABNORMAL HIGH (ref 38–126)
Anion gap: 10 (ref 5–15)
BUN: 131 mg/dL — ABNORMAL HIGH (ref 8–23)
CO2: 22 mmol/L (ref 22–32)
Calcium: 7.6 mg/dL — ABNORMAL LOW (ref 8.9–10.3)
Chloride: 105 mmol/L (ref 98–111)
Creatinine, Ser: 3.1 mg/dL — ABNORMAL HIGH (ref 0.61–1.24)
GFR, Estimated: 19 mL/min — ABNORMAL LOW (ref >60)
Glucose, Bld: 152 mg/dL — ABNORMAL HIGH (ref 70–99)
Potassium: 3.3 mmol/L — ABNORMAL LOW (ref 3.5–5.1)
Sodium: 137 mmol/L (ref 135–145)
Total Bilirubin: 1.8 mg/dL — ABNORMAL HIGH (ref 0.3–1.2)
Total Protein: 6.3 g/dL — ABNORMAL LOW (ref 6.5–8.1)

## 2021-08-18 LAB — CK: Total CK: 2220 U/L — ABNORMAL HIGH (ref 49–397)

## 2021-08-18 MED ORDER — POTASSIUM CHLORIDE CRYS ER 20 MEQ PO TBCR: 40.0000 meq | EXTENDED_RELEASE_TABLET | Freq: Two times a day (BID) | ORAL | Status: DC

## 2021-08-18 MED ORDER — FUROSEMIDE 10 MG/ML IJ SOLN
40.0000 mg | Freq: Two times a day (BID) | INTRAMUSCULAR | Status: DC
  Administered 2021-08-18 – 2021-08-19 (×3): 40 mg via INTRAVENOUS
  Filled 2021-08-18 (×3): qty 4

## 2021-08-18 MED ORDER — POTASSIUM CHLORIDE 20 MEQ PO PACK
40.0000 meq | PACK | Freq: Two times a day (BID) | ORAL | Status: DC
  Administered 2021-08-18 – 2021-08-19 (×3): 40 meq via ORAL
  Filled 2021-08-18 (×3): qty 2

## 2021-08-18 MED ORDER — METOLAZONE 2.5 MG PO TABS
2.5000 mg | ORAL_TABLET | Freq: Every day | ORAL | Status: DC
  Administered 2021-08-18 – 2021-08-19 (×2): 2.5 mg via ORAL
  Filled 2021-08-18 (×3): qty 1

## 2021-08-18 NOTE — Plan of Care (Signed)
  Problem: Health Behavior/Discharge Planning: Goal: Ability to manage health-related needs will improve Outcome: Not Progressing   

## 2021-08-18 NOTE — Progress Notes (Signed)
[facility]  PROGRESS NOTE   Subjective:   Patient feels much better.  Urine output is good. Family at bedside.  Objective:  Vital signs in last 24 hours:  Temp:  [97.7 F (36.5 C)-98.8 F (37.1 C)] 98.7 F (37.1 C) (11/13 1118) Pulse Rate:  [69-71] 70 (11/13 1118) Resp:  [16-18] 18 (11/13 1118) BP: (121-141)/(45-52) 127/50 (11/13 1118) SpO2:  [90 %-100 %] 92 % (11/13 1118)  Weight change:  Filed Weights   08/11/21 1921  Weight: 59.9 kg    Intake/Output: I/O last 3 completed shifts: In: 120 [P.O.:120] Out: 1000 [Urine:1000]   Intake/Output this shift:  Total I/O In: 0  Out: 600 [Urine:600]  Physical Exam: General:  No acute distress  Head:  Normocephalic, atraumatic. Moist oral mucosal membranes  Eyes:  Anicteric  Neck:  Supple  Lungs:   Clear to auscultation, normal effort  Heart:  S1S2 no rubs  Abdomen:   Soft, nontender, bowel sounds present  Extremities:  peripheral edema.  Neurologic:  Awake, alert, following commands  Skin:  No lesions  Access:     Basic Metabolic Panel: Recent Labs  Lab 08/13/21 0526 08/14/21 0553 08/15/21 0423 08/16/21 0633 08/17/21 0638 08/18/21 0901  NA 134* 131* 135 136 139 137  K 5.2* 6.0* 5.7* 4.9 4.0 3.3*  CL 105 103 107 105 105 105  CO2 20* 18* 17* 22 20* 22  GLUCOSE 94 113* 83 137* 93 152*  BUN 118* 118* 68* 113* 118* 131*  CREATININE 2.76* 3.01* 3.42* 3.28* 3.33* 3.10*  CALCIUM 8.3* 8.3* 8.1* 8.0* 7.7* 7.6*  MG 2.3 2.4  --   --   --   --   PHOS 6.7* 7.5*  --   --   --   --     CBC: Recent Labs  Lab 08/11/21 1930 08/12/21 0648 08/13/21 0526 08/14/21 0553 08/18/21 0901  WBC 8.7 7.7 9.4 11.5* 13.6*  NEUTROABS  --   --  7.9* 9.6*  --   HGB 9.6* 9.5* 9.0* 9.3* 8.2*  HCT 29.7* 29.0* 27.3* 28.4* 25.2*  MCV 84.6 85.8 85.6 84.5 84.8  PLT 190 190 196 223 234     Urinalysis: No results for input(s): COLORURINE, LABSPEC, PHURINE, GLUCOSEU, HGBUR, BILIRUBINUR, KETONESUR, PROTEINUR, UROBILINOGEN,  NITRITE, LEUKOCYTESUR in the last 72 hours.  Invalid input(s): APPERANCEUR    Imaging: MR ABDOMEN MRCP WO CONTRAST  Result Date: 08/17/2021 CLINICAL DATA:  87 year old male with history of elevated liver function tests. Elevated alkaline phosphatase level. EXAM: MRI ABDOMEN WITHOUT CONTRAST  (INCLUDING MRCP) TECHNIQUE: Multiplanar multisequence MR imaging of the abdomen was performed. Heavily T2-weighted images of the biliary and pancreatic ducts were obtained, and three-dimensional MRCP images were rendered by post processing. COMPARISON:  No prior abdominal MRI. CT of the abdomen and pelvis 06/04/2021. Abdominal ultrasound 08/14/2021. Hepatobiliary scan 08/15/2021. FINDINGS: Comment: Portions of today's study is limited by considerable patient motion. Additionally, study is limited for detection and characterization of visceral and/or vascular lesions by lack of IV gadolinium. Lower chest: Large right and small left pleural effusions. Cardiomegaly. Hepatobiliary: No definite suspicious cystic or solid hepatic lesions are confidently identified on today's noncontrast examination. Filling defect in the gallbladder, compatible with known gallstone. Gallbladder is moderately distended. Gallbladder wall thickness is normal. No pericholecystic fluid. No intra or extrahepatic biliary ductal dilatation noted on MRCP images. Common bile duct measures 7 mm within the porta hepatis (this is within normal limits for the patient's advanced age). Pancreas: No definite pancreatic mass  or peripancreatic fluid collections or inflammatory changes noted on today's noncontrast examination. No pancreatic ductal dilatation noted on MRCP images. Spleen:  Unremarkable. Adrenals/Urinary Tract: Bilateral kidneys and adrenal glands are unremarkable in appearance. No hydroureteronephrosis in the visualized portions of the abdomen. Stomach/Bowel: Visualized portions are unremarkable. Vascular/Lymphatic: Extensive aortic  atherosclerosis with infrarenal abdominal aortic aneurysm measuring 4.3 x 4.0 cm. No definite lymphadenopathy confidently identified in the abdomen on today's noncontrast examination. Other:  Trace volume of ascites. Musculoskeletal: No aggressive appearing osseous lesions are noted in the visualized portions of the skeleton. IMPRESSION: 1. Cholelithiasis without evidence of acute cholecystitis. 2. No evidence of choledocholithiasis or findings to suggest biliary tract obstruction. 3. Trace volume of ascites. 4. Large right and small left pleural effusions. 5. Cardiomegaly. Electronically Signed   By: Vinnie Langton M.D.   On: 08/17/2021 08:53     Medications:    piperacillin-tazobactam (ZOSYN)  IV 2.25 g (08/18/21 1320)    feeding supplement  237 mL Oral BID BM   furosemide  40 mg Intravenous BID   heparin injection (subcutaneous)  5,000 Units Subcutaneous Q8H   lidocaine  1 patch Transdermal Q24H   metolazone  2.5 mg Oral Daily   pantoprazole  40 mg Oral BID   polyethylene glycol  17 g Oral Daily   potassium chloride  40 mEq Oral BID   senna-docusate  1 tablet Oral BID    Assessment/ Plan:     Active Problems:   Moderate major depression, single episode (HCC)   Coronary artery disease   AKI (acute kidney injury) ([place])   Normochromic normocytic anemia   Essential hypertension   Hyponatremia   Acute on chronic combined systolic and diastolic CHF (congestive heart failure) (HCC)   HLD (hyperlipidemia)   Hyperkalemia   AAA (abdominal aortic aneurysm)   Elevated troponin  87 year old with history of hypertension, coronary artery disease, CABG, congestive heart failure, cerebrovascular accident, chronic kidney disease now admitted with history of increased leg swelling, generalized weakness and shortness of breath.   #1: Acute kidney injury on the top of chronic kidney disease: Acute kidney injury is probably secondary to ATN due to decreased cardiac output and/or due to  rhabdomyolysis..  We will continue to monitor closely.  If renal indicis does not improve he may need renal replacement therapy.   #2: Congestive heart failure: Continue fluid restriction.  We will start on Lasix at 40 mg twice a day along with metolazone 2.5 mg daily.    #3: Hypokalemia: Supplement as ordered.  #4: Anemia: This is possibly secondary to chronic kidney disease.  Needs iron and erythropoietin supplementation.   #5: Rhabdomyolysis: CPK levels are improving.  Possibly secondary to statins.   Spoke to the family at bedside.  Will monitor closely.   LOS: [place] 11/13/20222:32 PM

## 2021-08-18 NOTE — Progress Notes (Signed)
PROGRESS NOTE    Aadam Zhen Berrum  NIO:[id number] DOB: 12/16/1933 DOA: 08/11/2021 PCP: [name], MD   Chief Complaint  Patient presents with   Weakness    Brief Narrative: 87 y.o. Hispanic male with medical history significant for essential hypertension, CAD status post CABG, CHF, chronic kidney disease and CVA, who presented to the emergency room with acute onset   generalized weakness since Friday with diminished appetite and oral intake.  He's been found to have AKI as well as generalized weakness.  Being treated for rhabdo with IVF (caution given hx HF).  Other potential causes for his weakness being evaluated as noted below.    11/9 family at bedside. No overnight issues. 11/10 reports no abd pain/n/v. HIDA negative 11/11 on 2 L  then more sob, requiring 8L. Spoke to nephrology, recommended lasix 40mg  iv x1, will dc ivf. If UO starts dropping will need to initial HD. 11/12 pt reports less sob, still on 8L  11/13 family at bedside, report he "looks better, color better". Pt reports less sob.   Assessment & Plan:   Active Problems:   Moderate major depression, single episode (HCC)   Coronary artery disease   AKI (acute kidney injury) ([place])   Normochromic normocytic anemia   Essential hypertension   Hyponatremia   Acute on chronic combined systolic and diastolic CHF (congestive heart failure) (HCC)   HLD (hyperlipidemia)   Hyperkalemia   AAA (abdominal aortic aneurysm)   Elevated troponin    AKI  UA with hg, but no RBC Elevated LFT's Suspect rhabdo (urine myoglobin positive) 11/9 renal US suggesting med-renal dz. No hydron. Prostatomegaly. Continue ivf -caution with h/xo chf Ck rising K 6.0, ekg this am no peaked T waves Give kayxelate 30gm.  Give 10 units insulin 11/10 creatinine worsening, K down mildly Will give another dose of kayxelate Nephrology following Renal ultrasound without hydronephrosis please see full report 11/13 CK trending  down Renal function improving with diuresis At this point may be combo of rhabdo and cardiorenal Still volume overloaded, will start lasix 40mg  iv bid, add metolazone 2.5 daily (also discussed with nephrology) Monitor labs Avoid nephrotoxic meds     Bilateral Lower Extremity Weakness  Generalized Weakness Unclear etiology, before Friday, up and walking with walker - after Friday, wasn't able to  Granddaughter noted incontinence of bowel as well as numbness in legs as well Weakness could be from neurologic causes vs rhabdo/myositis 11/9 mri with cervical spinal canal stenosis and neural foraminal narrowing-see reports. 11/13continue lodicaine patch PT/OT as tolerated F/u with pcp as outpt    Hyperkalemia Now hypokalemia Initially treated with multiple doses of Kayexalate. Now hypokalemic due to IV diuretics Will start potassium 40 mEq twice daily with Lasix Monitor levels closely         Rhabdomyolysis UA Heme + and only 0-5 RBC's He's on amlodipine/simvastatin -> increases risk of rhabdo (sounds like he's been on this combination for a while -> regimen should be adjusted at discharge) - continue to hold  TSH elevated mildly, follow free T4 11/10-ck trended significantly higher, 18000. Tea colored urine CRP elevated 11/11 rheumatology was consulted for possible myositis.  Appreciate their input.  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 Will order this lab 11/13 CK trending down Continue to monitor Off of IV fluids due to CHF and volume overload     Elevated LFT's 2/2 rhabdo  Negative acute hepatitis panel  11/9 abdominal ultrasound with cholelithiasis and findings suspicious for acute cholecystitis.  Dilated common bile duct suggestive of obstructing common bile duct stone.  MRI abdomen and MRCP can be helpful. Right pleural effusion 11/10 HIDA  negative. GI/Surgery following Avoid nephrotoxic meds 11/11plan for MRCP to r/o retained CBD stone. If positive needs ERCP. 11/13 MRCP with cholelithiasis without evidence of acute cholecystitis.  No evidence of cholelithiasis or biliary tract obstruction. LFTs trending down-likely also due to CHF and improving with Lasix Continue Zosyn Follow-up GI   Bilateral Pleural effusion Found on MRCP Volume overloaded, and 2/2 chf Will start lasix IV as above   Acute on Chronic combined systolic and diastolic HF EF 99-24%, global hypokinesis 04/2021, grade II diastolic dysfunction, moderately reduced RVSF  11/13 volume overloaded Will start lasix 40 mg IV twice daily. Start metolazone 2.5 mg daily I's and O's strictly Daily weight      Lumbar Stenosis with Radiculopathy Lumbar imaging obtained with concern of LE weakness + incontinence of stool C/T spine imaging obtained with concern for UE pain, weakness as well - follow with neurology NSGY rec considering decompression when medically stable Preoperative clearance per cardiology - high risk surgical candidate (see 11/8 note) 11/13 follow-up with PCP as outpatient    Elevated D dimer  RUE Swelling No evidence of DVT in RUE VQ scan pending -> indeterminate LE [place] - negative for DVT 1/13 less suspicious for PE based on his overall picture      CAD with hx CABG  Elevated Troponin Aspirin, plavix still being held in setting of GI bleed (was supposed to hold until reeval) No CP, appreciate cardiology assistance - suspect demand Appreciate cardiology assistance  Sinus Bradycardia Currently metoprolol on hold HR as low as 40's documented outpatient  Appreciate cardiology recs (see 11/8 note, signing off) Concern that this may contribute to weakness, appreciate cardiology assistance  Hypertension Holding BP meds due to BP being low  HLD Holding simvastatin given elevated CK and on amlodipine, if resuming statin at  discharge should resume alternative statin or simvastatin at lower dose (after CK, LFT's improved)  GERD  Hx GI Bleed  Reflux Esophagitis  Gastric Ulcer  Erosive Gastropathy  Duodenal Ulcers PPI BID  DVT prophylaxis: lovenox Code Status: full Family Communication: family at bedside Disposition:   Status is: Inpatient  Remains inpatient appropriate because: needs IV treatment, in acute HF< worsening renal failure, ck elevated. Pt is sick and will transfer to PCU for higher level of care/monitoring      Consultants:  Renal Cardiology GI, surgery rheumatology  Procedures No DVT in RUE  Antimicrobials:  Anti-infectives (From admission, onward)    Start     Dose/Rate Route Frequency Ordered Stop   08/14/21 1800  piperacillin-tazobactam (ZOSYN) IVPB 2.25 g        2.25 g 100 mL/hr over 30 Minutes Intravenous Every 6 hours 08/14/21 1648            Subjective: No chest pain, less short of breath still on 8 L.  No abdominal pain  Objective: Vitals:   08/18/21 0018 08/18/21 0457 08/18/21 0810 08/18/21 1118  BP: (!) 121/45 (!) 141/48 (!) 128/52 (!) 127/50  Pulse: 69 71 69 70  Resp: 16 18 18 18   Temp: 98.1 F (36.7 C) 98.8 F (37.1 C) 98.3 F (36.8 C) 98.7 F (37.1 C)  TempSrc:      SpO2: 94% 100% 97% 92%  Weight:      Height:  Intake/Output Summary (Last 24 hours) at 08/18/2021 1257 Last data filed at 08/18/2021 0813 Gross per 24 hour  Intake 0 ml  Output 600 ml  Net -600 ml   Filed Weights   08/11/21 1921  Weight: 59.9 kg    Examination: Color appears to be better, more alert today Positive crackles bilaterally Regular S1-S2 no gallops positive JVD Soft benign positive bowel sounds Bilateral edema Alert oriented x3 Mood and affect appropriate in current setting    Data Reviewed: I have personally reviewed following labs and imaging studies  CBC: Recent Labs  Lab 08/11/21 1930 08/12/21 0648 08/13/21 0526 08/14/21 0553  08/18/21 0901  WBC 8.7 7.7 9.4 11.5* 13.6*  NEUTROABS  --   --  7.9* 9.6*  --   HGB 9.6* 9.5* 9.0* 9.3* 8.2*  HCT 29.7* 29.0* 27.3* 28.4* 25.2*  MCV 84.6 85.8 85.6 84.5 84.8  PLT 190 190 196 223 641    Basic Metabolic Panel: Recent Labs  Lab 08/13/21 0526 08/14/21 0553 08/15/21 0423 08/16/21 0633 08/17/21 0638 08/18/21 0901  NA 134* 131* 135 136 139 137  K 5.2* 6.0* 5.7* 4.9 4.0 3.3*  CL 105 103 107 105 105 105  CO2 20* 18* 17* 22 20* 22  GLUCOSE 94 113* 83 137* 93 152*  BUN 118* 118* 68* 113* 118* 131*  CREATININE 2.76* 3.01* 3.42* 3.28* 3.33* 3.10*  CALCIUM 8.3* 8.3* 8.1* 8.0* 7.7* 7.6*  MG 2.3 2.4  --   --   --   --   PHOS 6.7* 7.5*  --   --   --   --     GFR: Estimated Creatinine Clearance: 12.9 mL/min (A) (by C-G formula based on SCr of 3.1 mg/dL (H)).  Liver Function Tests: Recent Labs  Lab 08/14/21 0553 08/15/21 0423 08/16/21 0633 08/17/21 0638 08/18/21 0901  AST 994* 798* 544* 415* 271*  ALT 513* 458* 407* 416* 356*  ALKPHOS 183* 143* 170* 196* 202*  BILITOT 0.7 0.9 1.1 1.7* 1.8*  PROT 6.3* 5.5* 5.7* 5.9* 6.3*  ALBUMIN 2.4* 2.1* 1.8* 2.0* 2.1*    CBG: Recent Labs  Lab 08/11/21 1938  GLUCAP 95     Recent Results (from the past 240 hour(s))  Resp Panel by RT-PCR (Flu A&B, Covid) Nasopharyngeal Swab     Status: None   Collection Time: 08/12/21 12:03 AM   Specimen: Nasopharyngeal Swab; Nasopharyngeal(NP) swabs in vial transport medium  Result Value Ref Range Status   SARS Coronavirus 2 by RT PCR NEGATIVE NEGATIVE Final    Comment: (NOTE) SARS-CoV-2 target nucleic acids are NOT DETECTED.  The SARS-CoV-2 RNA is generally detectable in upper respiratory specimens during the acute phase of infection. The lowest concentration of SARS-CoV-2 viral copies this assay can detect is 138 copies/mL. A negative result does not preclude SARS-Cov-2 infection and should not be used as the sole basis for treatment or other patient management decisions. A  negative result may occur with  improper specimen collection/handling, submission of specimen other than nasopharyngeal swab, presence of viral mutation(s) within the areas targeted by this assay, and inadequate number of viral copies(<138 copies/mL). A negative result must be combined with clinical observations, patient history, and epidemiological information. The expected result is Negative.  Fact Sheet for Patients:  EntrepreneurPulse.com.au  Fact Sheet for Healthcare Providers:  IncredibleEmployment.be  This test is no t yet approved or cleared by the [place] FDA and  has been authorized for detection and/or diagnosis of SARS-CoV-2 by FDA under an Emergency  Use Authorization (EUA). This EUA will remain  in effect (meaning this test can be used) for the duration of the COVID-19 declaration under Section 564(b)(1) of the Act, 21 U.S.C.section 360bbb-3(b)(1), unless the authorization is terminated  or revoked sooner.       Influenza A by PCR NEGATIVE NEGATIVE Final   Influenza B by PCR NEGATIVE NEGATIVE Final    Comment: (NOTE) The Xpert Xpress SARS-CoV-2/FLU/RSV plus assay is intended as an aid in the diagnosis of influenza from Nasopharyngeal swab specimens and should not be used as a sole basis for treatment. Nasal washings and aspirates are unacceptable for Xpert Xpress SARS-CoV-2/FLU/RSV testing.  Fact Sheet for Patients: EntrepreneurPulse.com.au  Fact Sheet for Healthcare Providers: IncredibleEmployment.be  This test is not yet approved or cleared by the [place] FDA and has been authorized for detection and/or diagnosis of SARS-CoV-2 by FDA under an Emergency Use Authorization (EUA). This EUA will remain in effect (meaning this test can be used) for the duration of the COVID-19 declaration under Section 564(b)(1) of the Act, 21 U.S.C. section 360bbb-3(b)(1), unless the authorization  is terminated or revoked.  Performed at [facility], [address] [place], [place] [address]          Radiology Studies: MR ABDOMEN MRCP WO CONTRAST  Result Date: 08/17/2021 CLINICAL DATA:  87 year old male with history of elevated liver function tests. Elevated alkaline phosphatase level. EXAM: MRI ABDOMEN WITHOUT CONTRAST  (INCLUDING MRCP) TECHNIQUE: Multiplanar multisequence MR imaging of the abdomen was performed. Heavily T2-weighted images of the biliary and pancreatic ducts were obtained, and three-dimensional MRCP images were rendered by post processing. COMPARISON:  No prior abdominal MRI. CT of the abdomen and pelvis 06/04/2021. Abdominal ultrasound 08/14/2021. Hepatobiliary scan 08/15/2021. FINDINGS: Comment: Portions of today's study is limited by considerable patient motion. Additionally, study is limited for detection and characterization of visceral and/or vascular lesions by lack of IV gadolinium. Lower chest: Large right and small left pleural effusions. Cardiomegaly. Hepatobiliary: No definite suspicious cystic or solid hepatic lesions are confidently identified on today's noncontrast examination. Filling defect in the gallbladder, compatible with known gallstone. Gallbladder is moderately distended. Gallbladder wall thickness is normal. No pericholecystic fluid. No intra or extrahepatic biliary ductal dilatation noted on MRCP images. Common bile duct measures 7 mm within the porta hepatis (this is within normal limits for the patient's advanced age). Pancreas: No definite pancreatic mass or peripancreatic fluid collections or inflammatory changes noted on today's noncontrast examination. No pancreatic ductal dilatation noted on MRCP images. Spleen:  Unremarkable. Adrenals/Urinary Tract: Bilateral kidneys and adrenal glands are unremarkable in appearance. No hydroureteronephrosis in the visualized portions of the abdomen. Stomach/Bowel: Visualized portions are  unremarkable. Vascular/Lymphatic: Extensive aortic atherosclerosis with infrarenal abdominal aortic aneurysm measuring 4.3 x 4.0 cm. No definite lymphadenopathy confidently identified in the abdomen on today's noncontrast examination. Other:  Trace volume of ascites. Musculoskeletal: No aggressive appearing osseous lesions are noted in the visualized portions of the skeleton. IMPRESSION: 1. Cholelithiasis without evidence of acute cholecystitis. 2. No evidence of choledocholithiasis or findings to suggest biliary tract obstruction. 3. Trace volume of ascites. 4. Large right and small left pleural effusions. 5. Cardiomegaly. Electronically Signed   By: Vinnie Langton M.D.   On: 08/17/2021 08:53        Scheduled Meds:  feeding supplement  237 mL Oral BID BM   furosemide  40 mg Intravenous BID   heparin injection (subcutaneous)  5,000 Units Subcutaneous Q8H   lidocaine  1 patch Transdermal Q24H  metolazone  2.5 mg Oral Daily   pantoprazole  40 mg Oral BID   patiromer  16.8 g Oral Daily   polyethylene glycol  17 g Oral Daily   senna-docusate  1 tablet Oral BID   Continuous Infusions:  piperacillin-tazobactam (ZOSYN)  IV 2.25 g (08/18/21 0530)     LOS: 6 days    Time spent: 45 minutes with more than 50% on COC    Nolberto Hanlon, MD [facility]   To contact the attending provider between 7A-7P or the covering provider during after hours 7P-7A, please log into the web site [email or web address] and access using universal [place] password for that web site. If you do not have the password, please call the hospital operator.  08/18/2021, 12:57 PM

## 2021-08-19 ENCOUNTER — Inpatient Hospital Stay: Payer: Medicare HMO

## 2021-08-19 DIAGNOSIS — N179 Acute kidney failure, unspecified: Secondary | ICD-10-CM | POA: Diagnosis not present

## 2021-08-19 DIAGNOSIS — I5043 Acute on chronic combined systolic (congestive) and diastolic (congestive) heart failure: Secondary | ICD-10-CM | POA: Diagnosis not present

## 2021-08-19 DIAGNOSIS — I2510 Atherosclerotic heart disease of native coronary artery without angina pectoris: Secondary | ICD-10-CM | POA: Diagnosis not present

## 2021-08-19 DIAGNOSIS — I10 Essential (primary) hypertension: Secondary | ICD-10-CM | POA: Diagnosis not present

## 2021-08-19 LAB — COMPREHENSIVE METABOLIC PANEL
ALT: 287 U/L — ABNORMAL HIGH (ref 0–44)
AST: 183 U/L — ABNORMAL HIGH (ref 15–41)
Albumin: 2 g/dL — ABNORMAL LOW (ref 3.5–5.0)
Alkaline Phosphatase: 231 U/L — ABNORMAL HIGH (ref 38–126)
Anion gap: 12 (ref 5–15)
BUN: 142 mg/dL — ABNORMAL HIGH (ref 8–23)
CO2: 22 mmol/L (ref 22–32)
Calcium: 8 mg/dL — ABNORMAL LOW (ref 8.9–10.3)
Chloride: 107 mmol/L (ref 98–111)
Creatinine, Ser: 3.19 mg/dL — ABNORMAL HIGH (ref 0.61–1.24)
GFR, Estimated: 18 mL/min — ABNORMAL LOW (ref >60)
Glucose, Bld: 108 mg/dL — ABNORMAL HIGH (ref 70–99)
Potassium: 3.8 mmol/L (ref 3.5–5.1)
Sodium: 141 mmol/L (ref 135–145)
Total Bilirubin: 1.8 mg/dL — ABNORMAL HIGH (ref 0.3–1.2)
Total Protein: 5.8 g/dL — ABNORMAL LOW (ref 6.5–8.1)

## 2021-08-19 MED ORDER — POTASSIUM CHLORIDE 20 MEQ PO PACK: 40.0000 meq | PACK | Freq: Every day | ORAL | Status: DC

## 2021-08-19 NOTE — Progress Notes (Signed)
OT Cancellation Note  Patient Details Name: Saamir Armstrong MRN: [id number] DOB: 06/16/1934   Cancelled Treatment:    Reason Eval/Treat Not Completed: Patient not medically ready. Pt sustaining a decline in respiratory status, transfered to PCU. Will sign off at this time and await new consult when pt medically appropriate.   Dessie Coma, M.S. OTR/L  08/19/21, 9:20 AM  ascom [phone]

## 2021-08-19 NOTE — Progress Notes (Signed)
Speech Language Pathology Treatment: Dysphagia  Patient Details Name: Daniel Bolton MRN: [id number] DOB: 1934/02/21 Today's Date: 08/19/2021 Time: 6948-5462 SLP Time Calculation (min) (ACUTE ONLY): 60 min  Assessment / Plan / Recommendation Clinical Impression  Pt appears to present w/ declined functional status overall since initial evaluation -- he appears weakened and less engaged given MAX stim during this tx session. NSG and Family present report reduced oral intake -- unsure if pt is taking more than sips/bites(?) based on reports. Dietician noted a previous dx of severe malnutrition at recent admit. Pt was drowsy and required MAX verbal/tactile/visual stim/cues for participation in tx today -- he often just nodded his head; open-mouth posture noted at rest.   Pt presents w/ oropharyngeal phase dysphagia in light of declined Cognitive status and declined Medical status currently. In addition, per chart notes and Family report, pt has been declining in Medical Status since the last hospitalization/EGDs in 06/2021. His overall oral intake has been declining as well. Per EGD in 06/2021, pt had "(ulcers, non-bleeding gastric ulcers, and erosive gastropathy/esophagitis). Also, ANY decline in Cognitive awareness and Medical status can impact overall awareness/timing of swallow and safety during po tasks which increases risk for aspiration, choking. Pt's risk for aspiration is present but can be somewhat reduced when following strict aspiration precautions and using a modified diet consistency w/ FULL feeding support and Cues for follow through w/ strategies.      With MAX cues, pt consumed trials of ice chip(2) and thin liquids via PINCHED Straw(3) before he declined further -- he brushed hand away saying "no" when offered po's.  No immediate, overt clinical s/s of aspiration were noted w/ the few trials; no immediate cough and no decline in respiratory status during/post trials. Oral phase was slow  for bolus awareness and management; oral clearing of the boluses given was noted during open-mouth posture b/t trials. Pt exhibited decreased awareness of the thin liquids trials via PINCHED Straw; poor timing during drinking via Straw w/ reduced labial closure and lingual control of bolus trials. Pt did NOT attempt to Hold Cup firmly during drinking; required Mod-Max support/cues. Some oral confusion during oral care.        D/t pt's declined Medical and Cognitive status' w/ decreased attention to tasks, and his risk for aspiration, recommend a more dysphagia diet of Dysphagia level 1(puree) w/ thin liquids via Cup or Pinched Straw; strict aspiration precautions including sitting FULLY UPRIGHT for any oral intake; reduce Distractions during meals and engage pt during po's at meal w/ self-feeding, Holding Cup. Pills Crushed in Puree for safer swallowing. Supervision at meals. Recommend NOT giving po's if not Fully Awake to engage in po tasks d/t risk for aspiration, Pulmonary impact.  Recommend a Palliative Care consult for Daniel Bolton. Consulted Dietician for possible Calorie Count; discussion of alternative means of feeding. MD/NSG updated.      HPI HPI: Pt is a 87 y.o. male seen for evaluation of severe transaminitis with cholelithiasis and possible cholecystitis at the request of Dr. Nolberto Hanlon. Pt has a PMH of HTN, HLD, Hx of CVA, CKD Stage IIIb, COPD, CAD, Hx of CABG x3 in 2003, Hx of recent GI bleed 2/2 peptic ulcer disease treated with bipolar cautery 06/2021 by Dr. Virgina Jock. Pt presented to the [facility] ED 11/6 for generalized weakness, falls at home, and reduced oral intake. Patient is non-English speaking and video interpretor is used to help with exam. Patient lives at home with daughter and son-in-law. Patient's daughter, Daniel Bolton, provides most  of the history. Upon presentation to the ED, labs were significant for acute on chronic kidney injury with serum creatinine 3.42 (baseline Cr 1.3 - 1.7), BUN 118,  hemoglobin 9.6, hematocrit 29.7, hs-troponin 111, sodium 130, potassium 5.7, creatinine kinase 2617, ESR 93. He was also found to have significant transaminitis with AST 1049, ALT 494. His alk phos was 163 and tbili within normal limits at 0.9-0.7. Urinalysis was significant for proteinuria and positive myoglobin. He began treatment with IVF hydration for possible rhabdomyolysis. 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 He had RUQ US performed this morning showing large gallstone in neck of gallbladder with possible wall changes suggestive of cholecystitis and dilated common bile duct at 9.0 mm. General Surgery has been consulted. Daughter reports that her father has been complaining of abdominal pain over the past several days.   In the emergency department, he was found to have AKI with evidence of rhabdomyolysis. He also has a history of heart failure and chronic kidney disease.  On 07/31/21- patient presented for evaluation of Post COVID19 dyspnea w/ Pulmonolgy after testing Positive ~1 week earlier.   CXR at admit: Cardiomegaly, pulmonary vascular congestion and possible small left  pleural effusion.      SLP Plan  Continue with current plan of care      Recommendations for follow up therapy are one component of a multi-disciplinary discharge planning process, led by the attending physician.  Recommendations may be updated based on patient status, additional functional criteria and insurance authorization.    Recommendations  Diet recommendations: Dysphagia 1 (puree);Thin liquid Liquids provided via: Teaspoon;Cup;Straw (pinched straw for 1 sip at a time) Medication Administration: Crushed with puree (for safer swallowing) Supervision: Staff to assist with self feeding;Full supervision/cueing for compensatory strategies Compensations: Minimize environmental distractions;Slow rate;Small sips/bites;Lingual sweep for clearance of pocketing;Multiple dry swallows after  each bite/sip;Follow solids with liquid Postural Changes and/or Swallow Maneuvers: Out of bed for meals;Seated upright 90 degrees;Upright 30-60 min after meal                General recommendations:  (Palliative Care consult; Dietician following) Oral Care Recommendations: Oral care BID;Oral care before and after PO;Staff/trained caregiver to provide oral care Follow Up Recommendations: Skilled nursing-short term rehab (<3 hours/day) Assistance recommended at discharge: Frequent or constant Supervision/Assistance SLP Visit Diagnosis: Dysphagia, oropharyngeal phase (R13.12) Plan: Continue with current plan of care       GO                  Orinda Kenner, [place], [place] [facility] [phone] [facility]  08/19/2021, 3:39 PM

## 2021-08-19 NOTE — Progress Notes (Signed)
PROGRESS NOTE    Daniel Bolton  RUE:[id number] DOB: 1933-10-18 DOA: 08/11/2021 PCP: [name], MD   Chief Complaint  Patient presents with   Weakness    Brief Narrative: 87 y.o. Hispanic male with medical history significant for essential hypertension, CAD status post CABG, CHF, chronic kidney disease and CVA, who presented to the emergency room with acute onset   generalized weakness since Friday with diminished appetite and oral intake.  He's been found to have AKI as well as generalized weakness.  Being treated for rhabdo with IVF (caution given hx HF).  Other potential causes for his weakness being evaluated as noted below.    11/9 family at bedside. No overnight issues. 11/10 reports no abd pain/n/v. HIDA negative 11/11 on 2 L  then more sob, requiring 8L. Spoke to nephrology, recommended lasix 40mg  iv x1, will dc ivf. If UO starts dropping will need to initial HD. 11/12 pt reports less sob, still on 8L [place] 11/13 family at bedside, report he "looks better, color better". Pt reports less sob.  11/14 c/o pain with bowel movement .   Assessment & Plan:   Active Problems:   Moderate major depression, single episode (HCC)   Coronary artery disease   AKI (acute kidney injury) ([place])   Normochromic normocytic anemia   Essential hypertension   Hyponatremia   Acute on chronic combined systolic and diastolic CHF (congestive heart failure) (HCC)   HLD (hyperlipidemia)   Hyperkalemia   AAA (abdominal aortic aneurysm)   Elevated troponin    AKI  UA with hg, but no RBC Elevated LFT's Suspect rhabdo (urine myoglobin positive) 11/9 renal US suggesting med-renal dz. No hydron. Prostatomegaly. Continue ivf -caution with h/xo chf Ck rising Renal ultrasound without hydronephrosis please see full report 11/14 Ck trending down Renal function appears to be stabilizing while on IV diuretics Was started on IV Lasix and metolazone due to volume overload Nephrology  following-if renal indicis does not improve he may need renal replacement therapy Avoid nephrotoxic meds       Bilateral Lower Extremity Weakness  Generalized Weakness Unclear etiology, before Friday, up and walking with walker - after Friday, wasn't able to  Granddaughter noted incontinence of bowel as well as numbness in legs as well Weakness could be from neurologic causes vs rhabdo/myositis 11/9 mri with cervical spinal canal stenosis and neural foraminal narrowing-see reports. Continue lidocaine patch  PT OT consulted  Follow-up with PCP as outpatient      Hyperkalemia Now hypokalemia Initially treated with multiple doses of Kayexalate. Now hypokalemic due to IV diuretics 11/14 replaced and stable Changed to potassium 40 mEq daily as he is receiving IV Lasix        Rhabdomyolysis UA Heme + and only 0-5 RBC's He's on amlodipine/simvastatin -> increases risk of rhabdo (sounds like he's been on this combination for a while -> regimen should be adjusted at discharge) - continue to hold  TSH elevated mildly, follow free T4 11/10-ck trended significantly higher, 18000. Tea colored urine CRP elevated 11/11 rheumatology was consulted for possible myositis.  Appreciate their input.  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 Will order this lab 14 CK trending down Off of IV fluids due to CHF and volume overload      Elevated LFT's 2/2 rhabdo  Negative acute hepatitis panel 11/9 abdominal ultrasound with cholelithiasis and findings suspicious for acute cholecystitis.  Dilated common bile duct  suggestive of obstructing common bile duct stone.  MRI abdomen and MRCP can be helpful. Right pleural effusion 11/10 HIDA negative. GI/Surgery following Avoid nephrotoxic meds 11/11plan for MRCP to r/o retained CBD stone. If positive needs ERCP. MRCP with cholelithiasis  without evidence of acute cholecystitis.  No evidence of cholelithiasis or biliary tract obstruction. LFTs trending down-likely also due to CHF and improving with Lasix 11/14 will dc zosyn, will d/w gi/surgery   Bilateral Pleural effusion Found on MRCP Volume overloaded, and 2/2 chf on lasix IV as above   Acute on Chronic combined systolic and diastolic HF EF 44-96%, global hypokinesis 04/2021, grade II diastolic dysfunction, moderately reduced RVSF  11/13 volume overloaded On lasix 40 mg IV twice daily. Start metolazone 2.5 mg daily I's and O's strictly Daily weight Cxr will obtain cxr     Lumbar Stenosis with Radiculopathy Lumbar imaging obtained with concern of LE weakness + incontinence of stool C/T spine imaging obtained with concern for UE pain, weakness as well - follow with neurology NSGY rec considering decompression when medically stable Preoperative clearance per cardiology - high risk surgical candidate (see 11/8 note)  follow-up with PCP as outpatient    Elevated D dimer  RUE Swelling No evidence of DVT in RUE VQ scan pending -> indeterminate LE [place] - negative for DVT  less suspicious for PE based on his overall picture      CAD with hx CABG  Elevated Troponin Aspirin, plavix still being held in setting of GI bleed (was supposed to hold until reeval) No CP, appreciate cardiology assistance - suspect demand Appreciate cardiology assistance  Sinus Bradycardia Currently metoprolol on hold HR as low as 40's documented outpatient  Appreciate cardiology recs (see 11/8 note, signing off) Concern that this may contribute to weakness, appreciate cardiology assistance  Hypertension Holding BP meds due to BP being low  HLD Holding simvastatin given elevated CK and on amlodipine, if resuming statin at discharge should resume alternative statin or simvastatin at lower dose (after CK, LFT's improved)  GERD  Hx GI Bleed  Reflux Esophagitis  Gastric Ulcer   Erosive Gastropathy  Duodenal Ulcers PPI BID  DVT prophylaxis: lovenox Code Status: full Family Communication: family at bedside Disposition:   Status is: Inpatient  Remains inpatient appropriate because: needs IV treatment, in acute HF< worsening renal failure, ck elevated. Pt is sick and will transfer to PCU for higher level of care/monitoring      Consultants:  Renal Cardiology GI, surgery rheumatology  Procedures No DVT in RUE  Antimicrobials:  Anti-infectives (From admission, onward)    Start     Dose/Rate Route Frequency Ordered Stop   08/14/21 1800  piperacillin-tazobactam (ZOSYN) IVPB 2.25 g        2.25 g 100 mL/hr over 30 Minutes Intravenous Every 6 hours 08/14/21 1648            Subjective: Cant say if sob /breathing is better. No cp  Objective: Vitals:   08/18/21 1118 08/18/21 1515 08/19/21 0206 08/19/21 0337  BP: (!) 127/50 (!) 132/58  (!) 118/50  Pulse: 70 77  72  Resp: 18 18  20   Temp: 98.7 F (37.1 C) 98.4 F (36.9 C)  99.5 F (37.5 C)  TempSrc:      SpO2: 92% 94%  91%  Weight:   64.8 kg   Height:        Intake/Output Summary (Last 24 hours) at 08/19/2021 0831 Last data filed at 08/19/2021 0114 Gross per 24 hour  Intake 0 ml  Output 800 ml  Net -800 ml   Filed Weights   08/11/21 1921 08/19/21 0206  Weight: 59.9 kg 64.8 kg    Examination: Appears tired, nontachypnic +crackles b/l Regular s1/s 2 no gallop Soft benign +bs +edema b/l Grossly intact Mood and affect appropriate in current setting   Data Reviewed: I have personally reviewed following labs and imaging studies  CBC: Recent Labs  Lab 08/13/21 0526 08/14/21 0553 08/18/21 0901  WBC 9.4 11.5* 13.6*  NEUTROABS 7.9* 9.6*  --   HGB 9.0* 9.3* 8.2*  HCT 27.3* 28.4* 25.2*  MCV 85.6 84.5 84.8  PLT 196 223 382    Basic Metabolic Panel: Recent Labs  Lab 08/13/21 0526 08/14/21 0553 08/15/21 0423 08/16/21 0633 08/17/21 0638 08/18/21 0901 08/19/21 0457  NA  134* 131* 135 136 139 137 141  K 5.2* 6.0* 5.7* 4.9 4.0 3.3* 3.8  CL 105 103 107 105 105 105 107  CO2 20* 18* 17* 22 20* 22 22  GLUCOSE 94 113* 83 137* 93 152* 108*  BUN 118* 118* 68* 113* 118* 131* 142*  CREATININE 2.76* 3.01* 3.42* 3.28* 3.33* 3.10* 3.19*  CALCIUM 8.3* 8.3* 8.1* 8.0* 7.7* 7.6* 8.0*  MG 2.3 2.4  --   --   --   --   --   PHOS 6.7* 7.5*  --   --   --   --   --     GFR: Estimated Creatinine Clearance: 13.7 mL/min (A) (by C-G formula based on SCr of 3.19 mg/dL (H)).  Liver Function Tests: Recent Labs  Lab 08/15/21 0423 08/16/21 5053 08/17/21 0638 08/18/21 0901 08/19/21 0457  AST 798* 544* 415* 271* 183*  ALT 458* 407* 416* 356* 287*  ALKPHOS 143* 170* 196* 202* 231*  BILITOT 0.9 1.1 1.7* 1.8* 1.8*  PROT 5.5* 5.7* 5.9* 6.3* 5.8*  ALBUMIN 2.1* 1.8* 2.0* 2.1* 2.0*    CBG: No results for input(s): GLUCAP in the last 168 hours.    Recent Results (from the past 240 hour(s))  Resp Panel by RT-PCR (Flu A&B, Covid) Nasopharyngeal Swab     Status: None   Collection Time: 08/12/21 12:03 AM   Specimen: Nasopharyngeal Swab; Nasopharyngeal(NP) swabs in vial transport medium  Result Value Ref Range Status   SARS Coronavirus 2 by RT PCR NEGATIVE NEGATIVE Final    Comment: (NOTE) SARS-CoV-2 target nucleic acids are NOT DETECTED.  The SARS-CoV-2 RNA is generally detectable in upper respiratory specimens during the acute phase of infection. The lowest concentration of SARS-CoV-2 viral copies this assay can detect is 138 copies/mL. A negative result does not preclude SARS-Cov-2 infection and should not be used as the sole basis for treatment or other patient management decisions. A negative result may occur with  improper specimen collection/handling, submission of specimen other than nasopharyngeal swab, presence of viral mutation(s) within the areas targeted by this assay, and inadequate number of viral copies(<138 copies/mL). A negative result must be combined  with clinical observations, patient history, and epidemiological information. The expected result is Negative.  Fact Sheet for Patients:  EntrepreneurPulse.com.au  Fact Sheet for Healthcare Providers:  IncredibleEmployment.be  This test is no t yet approved or cleared by the [place] FDA and  has been authorized for detection and/or diagnosis of SARS-CoV-2 by FDA under an Emergency Use Authorization (EUA). This EUA will remain  in effect (meaning this test can be used) for the duration of the COVID-19 declaration under Section 564(b)(1) of the Act, 21  U.S.C.section 360bbb-3(b)(1), unless the authorization is terminated  or revoked sooner.       Influenza A by PCR NEGATIVE NEGATIVE Final   Influenza B by PCR NEGATIVE NEGATIVE Final    Comment: (NOTE) The Xpert Xpress SARS-CoV-2/FLU/RSV plus assay is intended as an aid in the diagnosis of influenza from Nasopharyngeal swab specimens and should not be used as a sole basis for treatment. Nasal washings and aspirates are unacceptable for Xpert Xpress SARS-CoV-2/FLU/RSV testing.  Fact Sheet for Patients: EntrepreneurPulse.com.au  Fact Sheet for Healthcare Providers: IncredibleEmployment.be  This test is not yet approved or cleared by the [place] FDA and has been authorized for detection and/or diagnosis of SARS-CoV-2 by FDA under an Emergency Use Authorization (EUA). This EUA will remain in effect (meaning this test can be used) for the duration of the COVID-19 declaration under Section 564(b)(1) of the Act, 21 U.S.C. section 360bbb-3(b)(1), unless the authorization is terminated or revoked.  Performed at [facility], [address] [place], [place] [address]          Radiology Studies: No results found.      Scheduled Meds:  feeding supplement  237 mL Oral BID BM   furosemide  40 mg Intravenous BID   heparin injection  (subcutaneous)  5,000 Units Subcutaneous Q8H   lidocaine  1 patch Transdermal Q24H   metolazone  2.5 mg Oral Daily   pantoprazole  40 mg Oral BID   polyethylene glycol  17 g Oral Daily   potassium chloride  40 mEq Oral BID   senna-docusate  1 tablet Oral BID   Continuous Infusions:  piperacillin-tazobactam (ZOSYN)  IV 2.25 g (08/19/21 0340)     LOS: 7 days    Time spent: 35 minutes with more than 50% on COC    Nolberto Hanlon, MD [facility]   To contact the attending provider between 7A-7P or the covering provider during after hours 7P-7A, please log into the web site [email or web address] and access using universal [place] password for that web site. If you do not have the password, please call the hospital operator.  08/19/2021, 8:31 AM

## 2021-08-19 NOTE — Evaluation (Signed)
Physical Therapy Re-Evaluation Patient Details Name: Daniel Bolton MRN: [id number] DOB: [birth year] Today's Date: 08/19/2021  History of Present Illness  Pt is an 87 y/o M admitted on 08/11/21 after presenting to the ED with acute onset of generalized weakness with diminished appetite & oral intake. Pt with 1 fall, sliding down to the floor prior to admission. MRI results show several protruding disc in lumbar region. Pt was also found to have AKI & being treated for rhabdo with IVF. Pt started requiring HFNC & was transferred to higher level of care. PT has been re-consulted for re-evaluation. PMH: HTN, CAD s/p CABG, CHF, CKD, CVA   Clinical Impression  Pt seen for PT re-evaluation with grandson present during session. Pt eager to sit in recliner but after requiring max assist for supine>sit, PT observed pt was incontinent of BM. Pt tolerated sitting EOB ~3 minutes with min assist then standing ~45 seconds with max assist & RW to allow nurse to perform peri hygiene. Pt requires +2 assist for stand pivot to recliner with pt being successful without use of RW vs attempting transfer with AD. Pt able to maintain SpO2 >90% throughout session on 8L/min HFNC. Will continue to follow pt acutely to progress bed mobility, transfers, & gait as able. Pt would benefit from STR upon d/c to maximize independence with functional mobility, reduce fall risk, & decrease caregiver burden prior to return home.  Of note, pt demonstrates shaking/jerking in BUE & BLE that impair balance & mobility during session.      Recommendations for follow up therapy are one component of a multi-disciplinary discharge planning process, led by the attending physician.  Recommendations may be updated based on patient status, additional functional criteria and insurance authorization.  Follow Up Recommendations Skilled nursing-short term rehab (<3 hours/day)    Assistance Recommended at Discharge Frequent or constant  Supervision/Assistance  Functional Status Assessment Patient has had a recent decline in their functional status and demonstrates the ability to make significant improvements in function in a reasonable and predictable amount of time.  Equipment Recommendations  None recommended by PT (TBD in next venue)    Recommendations for Other Services       Precautions / Restrictions Precautions Precautions: Fall Restrictions Weight Bearing Restrictions: No      Mobility  Bed Mobility Overal bed mobility: Needs Assistance Bed Mobility: Supine to Sit     Supine to sit: Max assist;HOB elevated          Transfers Overall transfer level: Needs assistance Equipment used: Rolling walker (2 wheels);2 person hand held assist Transfers: Sit to/from Stand;Bed to chair/wheelchair/BSC Sit to Stand: Max assist (assistance for hand placement to push to standing then place on RW) Stand pivot transfers: Max assist;+2 physical assistance (Originally attempts stand pivot with RW & +2 assist but pt does not move feet. Removed RW & provided +2 HHA & pt able to initiate stepping & turning to sit in recliner)              Ambulation/Gait                  Stairs            Wheelchair Mobility    Modified Rankin (Stroke Patients Only)       Balance Overall balance assessment: Needs assistance Sitting-balance support: Bilateral upper extremity supported;Feet supported Sitting balance-Leahy Scale: Poor Sitting balance - Comments: min assist static sitting, at times with anterior lean, BUE shaking/jerking impair balance  Standing balance-Leahy Scale: Zero Standing balance comment: BUE support required                             Pertinent Vitals/Pain Pain Assessment: Faces Faces Pain Scale: Hurts little more Pain Location: BLE knees, back Pain Descriptors / Indicators: Discomfort Pain Intervention(s): Monitored during session;Repositioned    Home Living  Family/patient expects to be discharged to:: Private residence Living Arrangements: Alone Available Help at Discharge: Family;Available PRN/intermittently Type of Home: Mobile home Home Access: Stairs to enter Entrance Stairs-Rails: Right Entrance Stairs-Number of Steps: 4   Home Layout: One level Home Equipment: Cane - single point;Rolling Walker (2 wheels) Additional Comments: Pt lives alone . Son and Daughter present and report "everyone works" but they assist intermittently as needed.    Prior Function Prior Level of Function : Independent/Modified Independent             Mobility Comments: Pt uses a RW for amb w/ MOD-I ADLs Comments: Daughters assist w/ food prep, groceries, and take to medical appointments     Hand Dominance        Extremity/Trunk Assessment   Upper Extremity Assessment Upper Extremity Assessment: Generalized weakness (Pt with shaking/jerking movements in all extremities during session)    Lower Extremity Assessment Lower Extremity Assessment: Generalized weakness (Pt with shaking/jerking movements in all extremities during session)    Cervical / Trunk Assessment Cervical / Trunk Assessment: Kyphotic  Communication   Communication: Prefers language other than English;HOH (grandson present & assisted with interpreting)  Cognition Arousal/Alertness: Lethargic Behavior During Therapy: Flat affect Overall Cognitive Status: Impaired/Different from baseline                                 General Comments: Pt's grandson reports pt does not recognize him, pt follows simple commands with extra time.        General Comments General comments (skin integrity, edema, etc.): Pt incontinent of BM & PT assists him with standing while RN performs peri hygiene total assist. Pt on 8L/min during session with SPO2 >90%.    Exercises     Assessment/Plan    PT Assessment Patient needs continued PT services  PT Problem List Decreased  strength;Decreased range of motion;Decreased activity tolerance;Decreased balance;Decreased mobility;Decreased safety awareness;Decreased coordination;Decreased cognition;Cardiopulmonary status limiting activity;Impaired sensation;Decreased knowledge of use of DME;Pain       PT Treatment Interventions Gait training;Stair training;Functional mobility training;Therapeutic exercise;Balance training;DME instruction;Therapeutic activities;Cognitive remediation;Patient/family education;Modalities;Neuromuscular re-education    PT Goals (Current goals can be found in the Care Plan section)  Acute Rehab PT Goals Patient Stated Goal: pt eager to sit in recliner PT Goal Formulation: With patient Time For Goal Achievement: 09/02/21 Potential to Achieve Goals: Good    Frequency Min 2X/week   Barriers to discharge Inaccessible home environment;Decreased caregiver support      Co-evaluation               AM-PAC PT "6 Clicks" Mobility  Outcome Measure Help needed turning from your back to your side while in a flat bed without using bedrails?: A Lot Help needed moving from lying on your back to sitting on the side of a flat bed without using bedrails?: Total Help needed moving to and from a bed to a chair (including a wheelchair)?: Total Help needed standing up from a chair using your arms (e.g., wheelchair or bedside chair)?: Total Help  needed to walk in hospital room?: Total Help needed climbing 3-5 steps with a railing? : Total 6 Click Score: 7    End of Session Equipment Utilized During Treatment: Oxygen Activity Tolerance: Patient tolerated treatment well Patient left: in chair;with family/visitor present;with call bell/phone within reach;with nursing/sitter in room Nurse Communication: Mobility status PT Visit Diagnosis: Other abnormalities of gait and mobility (R26.89);Muscle weakness (generalized) (M62.81);Difficulty in walking, not elsewhere classified (R26.2);Unsteadiness on feet  (R26.81)    Time: 8099-8338 PT Time Calculation (min) (ACUTE ONLY): 22 min   Charges:   PT Evaluation $PT Re-evaluation: 1 Re-eval PT Treatments $Therapeutic Activity: 8-22 mins        Lavone Nian, PT, DPT 08/19/21, 3:34 PM   Waunita Schooner 08/19/2021, 3:31 PM

## 2021-08-19 NOTE — Plan of Care (Signed)
  Problem: Education: Goal: Knowledge of General Education information will improve Description: Including pain rating scale, medication(s)/side effects and non-pharmacologic comfort measures 08/19/2021 1626 by Cristela Blue, RN Outcome: Progressing 08/19/2021 1626 by Cristela Blue, RN Outcome: Progressing   Problem: Health Behavior/Discharge Planning: Goal: Ability to manage health-related needs will improve 08/19/2021 1626 by Cristela Blue, RN Outcome: Progressing 08/19/2021 1626 by Cristela Blue, RN Outcome: Progressing   Problem: Clinical Measurements: Goal: Ability to maintain clinical measurements within normal limits will improve 08/19/2021 1626 by Cristela Blue, RN Outcome: Progressing 08/19/2021 1626 by Cristela Blue, RN Outcome: Progressing Goal: Will remain free from infection 08/19/2021 1626 by Cristela Blue, RN Outcome: Progressing 08/19/2021 1626 by Cristela Blue, RN Outcome: Progressing Goal: Diagnostic test results will improve 08/19/2021 1626 by Cristela Blue, RN Outcome: Progressing 08/19/2021 1626 by Cristela Blue, RN Outcome: Progressing Goal: Respiratory complications will improve 08/19/2021 1626 by Cristela Blue, RN Outcome: Progressing 08/19/2021 1626 by Cristela Blue, RN Outcome: Progressing Goal: Cardiovascular complication will be avoided 08/19/2021 1626 by Cristela Blue, RN Outcome: Progressing 08/19/2021 1626 by Cristela Blue, RN Outcome: Progressing   Problem: Activity: Goal: Risk for activity intolerance will decrease 08/19/2021 1626 by Cristela Blue, RN Outcome: Progressing 08/19/2021 1626 by Cristela Blue, RN Outcome: Progressing   Problem: Nutrition: Goal: Adequate nutrition will be maintained 08/19/2021 1626 by Cristela Blue, RN Outcome: Progressing 08/19/2021 1626 by Cristela Blue, RN Outcome: Progressing   Problem: Coping: Goal: Level of anxiety will decrease 08/19/2021 1626 by Cristela Blue, RN Outcome:  Progressing 08/19/2021 1626 by Cristela Blue, RN Outcome: Progressing   Problem: Elimination: Goal: Will not experience complications related to bowel motility 08/19/2021 1626 by Cristela Blue, RN Outcome: Progressing 08/19/2021 1626 by Cristela Blue, RN Outcome: Progressing Goal: Will not experience complications related to urinary retention 08/19/2021 1626 by Cristela Blue, RN Outcome: Progressing 08/19/2021 1626 by Cristela Blue, RN Outcome: Progressing   Problem: Pain Managment: Goal: General experience of comfort will improve 08/19/2021 1626 by Cristela Blue, RN Outcome: Progressing 08/19/2021 1626 by Cristela Blue, RN Outcome: Progressing   Problem: Safety: Goal: Ability to remain free from injury will improve 08/19/2021 1626 by Cristela Blue, RN Outcome: Progressing 08/19/2021 1626 by Cristela Blue, RN Outcome: Progressing   Problem: Skin Integrity: Goal: Risk for impaired skin integrity will decrease 08/19/2021 1626 by Cristela Blue, RN Outcome: Progressing 08/19/2021 1626 by Cristela Blue, RN Outcome: Progressing

## 2021-08-19 NOTE — Progress Notes (Signed)
PT Cancellation Note  Patient Details Name: Daniel Bolton MRN: [id number] DOB: 01-14-1934   Cancelled Treatment:    Reason Eval/Treat Not Completed: Medical issues which prohibited therapy (Pt sustaining a decline in respiratory status, transfered to PCU, PT order not continued. Will sign off at this time and await new consult.)  9:19 AM, 08/19/21 Etta Grandchild, PT, DPT Physical Therapist - [facility]  [phone] ([place])    Brandice Busser C 08/19/2021, 9:18 AM

## 2021-08-19 NOTE — Progress Notes (Signed)
[facility]  PROGRESS NOTE   Subjective:   Multiple family members at bedside. Spoken to patient with assistance of family.  Complains of constipation and pain with bowel movements.   UOP 1441mL Creatinine 3.19 (3.1)  CK 2220 (4493)  Objective:  Vital signs in last 24 hours:  Temp:  [98.4 F (36.9 C)-99.5 F (37.5 C)] 98.4 F (36.9 C) (11/14 1104) Pulse Rate:  [66-72] 66 (11/14 1104) Resp:  [18-20] 18 (11/14 1104) BP: (118-120)/(43-50) 119/46 (11/14 1104) SpO2:  [91 %-96 %] 96 % (11/14 1104) Weight:  [64.8 kg] 64.8 kg (11/14 0206)  Weight change:  Filed Weights   08/11/21 1921 08/19/21 0206  Weight: 59.9 kg 64.8 kg    Intake/Output: I/O last 3 completed shifts: In: 0  Out: 1400 [Urine:1400]   Intake/Output this shift:  Total I/O In: 240 [P.O.:240] Out: 400 [Urine:400]  Physical Exam: General:  No acute distress, cachectic  Head:  Normocephalic, atraumatic. Moist oral mucosal membranes  Eyes:  Anicteric  Neck:  Supple  Lungs:   Clear to auscultation, normal effort  Heart:  regular  Abdomen:   Soft, nontender, bowel sounds present  Extremities:  peripheral edema.  Neurologic:  Awake, alert, following commands  Skin:  No lesions        Basic Metabolic Panel: Recent Labs  Lab 08/13/21 0526 08/14/21 0553 08/15/21 0423 08/16/21 0633 08/17/21 0638 08/18/21 0901 08/19/21 0457  NA 134* 131* 135 136 139 137 141  K 5.2* 6.0* 5.7* 4.9 4.0 3.3* 3.8  CL 105 103 107 105 105 105 107  CO2 20* 18* 17* 22 20* 22 22  GLUCOSE 94 113* 83 137* 93 152* 108*  BUN 118* 118* 68* 113* 118* 131* 142*  CREATININE 2.76* 3.01* 3.42* 3.28* 3.33* 3.10* 3.19*  CALCIUM 8.3* 8.3* 8.1* 8.0* 7.7* 7.6* 8.0*  MG 2.3 2.4  --   --   --   --   --   PHOS 6.7* 7.5*  --   --   --   --   --      CBC: Recent Labs  Lab 08/13/21 0526 08/14/21 0553 08/18/21 0901  WBC 9.4 11.5* 13.6*  NEUTROABS 7.9* 9.6*  --   HGB 9.0* 9.3* 8.2*  HCT 27.3* 28.4* 25.2*  MCV 85.6 84.5  84.8  PLT 196 223 234      Urinalysis: No results for input(s): COLORURINE, LABSPEC, PHURINE, GLUCOSEU, HGBUR, BILIRUBINUR, KETONESUR, PROTEINUR, UROBILINOGEN, NITRITE, LEUKOCYTESUR in the last 72 hours.  Invalid input(s): APPERANCEUR    Imaging: DG Chest Port 1 View  Result Date: 08/19/2021 CLINICAL DATA:  Weakness EXAM: PORTABLE CHEST 1 VIEW COMPARISON:  08/12/2021 FINDINGS: Diffuse hazy opacification of the chest, progressed. There is history of CHF and this could be pulmonary edema, but pneumonia is also considered. Small if any pleural fluid. Cardiomegaly with prior CABG. IMPRESSION: Worsening generalized airspace disease. Electronically Signed   By: Jorje Guild M.D.   On: 08/19/2021 11:48     Medications:      feeding supplement  237 mL Oral BID BM   furosemide  40 mg Intravenous BID   heparin injection (subcutaneous)  5,000 Units Subcutaneous Q8H   lidocaine  1 patch Transdermal Q24H   metolazone  2.5 mg Oral Daily   pantoprazole  40 mg Oral BID   polyethylene glycol  17 g Oral Daily   [START ON 08/20/2021] potassium chloride  40 mEq Oral Daily   senna-docusate  1 tablet Oral BID  Assessment/ Plan:     Active Problems:   Moderate major depression, single episode (HCC)   Coronary artery disease   AKI (acute kidney injury) ([place])   Normochromic normocytic anemia   Essential hypertension   Hyponatremia   Acute on chronic combined systolic and diastolic CHF (congestive heart failure) (HCC)   HLD (hyperlipidemia)   Hyperkalemia   AAA (abdominal aortic aneurysm)   Elevated troponin  Mr. Freddie Dymek is a 87 y.o. Hispanic male with hypertension, coronary artery disease status post CABG, congestive heart failure, cerebrovascular accident, who is admitted to [facility] on 08/11/2021 for Shortness of breath [R06.02] Hyperkalemia [E87.5] Positive D dimer [R79.89] Elevated troponin [R77.8] Swelling of arm [M79.89] Generalized weakness [R53.1] AKI (acute kidney  injury) ([place]) [N17.9]   Acute kidney injury on chronic kidney disease stage IV: baseline creatinine of 2.6, GFR of 24 on 07/31/2021. Followed by Dr. Lanora Manis. No acute indication for dialysis.Acute kidney injury secondary to rhabdomyolysis and acute cardiorenal syndrome.   Hypertension and acute exacerbation of systolic and diastolic congestive heart failure. Continue furosemide and metolazone.   Anemia with chronic kidney disease: Hemoglobin 8.2. normocytic.   Rhabdomyolysis: holding statins.   Hypokalemia: resolved with IV replacement.    LOS: [place], [facility] 11/14/20223:40 PM

## 2021-08-19 NOTE — Evaluation (Addendum)
Occupational Therapy Re-evaluation Patient Details Name: Daniel Bolton MRN: [id number] DOB: 23-Jul-1934 Today's Date: 08/19/2021   History of Present Illness Pt is an 87 y/o M admitted on 08/11/21 after presenting to the ED with acute onset of generalized weakness with diminished appetite & oral intake. Pt with 1 fall, sliding down to the floor prior to admission. MRI results show several protruding disc in lumbar region. Pt was also found to have AKI & being treated for rhabdo with IVF. Pt started requiring HFNC & was transferred to higher level of care. PT has been re-consulted for re-evaluation. PMH: HTN, CAD s/p CABG, CHF, CKD, CVA   Clinical Impression   Pt seen for OT re-evaluation on this date following transfer to PCU. In-person interpreter Estill Bamberg) used throughout session. Upon arrival to room, pt awake and seated  upright in recliner following PT evaluation. Pt reporting no pain and agreeable to OT tx. Pt currently presents with decreased strength, activity tolerance, and awareness of deficits. While sitting in recliner, pt able to bring cup to face and grooming items to face/underarm, however fatigues quickly, frequently dropping items and requiring MIN A to maintain grasp on items. With MAX A, pt performed sit>stand transfer with RW however was observed to be incontinent of BM following transfer. Pt required MAX A for sit>stand peri-care. Following x3 sit>stand transfers for peri-care, pt reporting fatigue (SpO2 >90% while on 8L/min O2) and pt left in recliner, with all needs within reach and family present. Pt continues to benefit from skilled OT services to maximize return to PLOF and minimize risk of future falls, injury, caregiver burden, and readmission. Goals and discharge recommendation remains appropriate.       Recommendations for follow up therapy are one component of a multi-disciplinary discharge planning process, led by the attending physician.  Recommendations may be  updated based on patient status, additional functional criteria and insurance authorization.   Follow Up Recommendations  Skilled nursing-short term rehab (<3 hours/day)    Assistance Recommended at Discharge Intermittent Supervision/Assistance  Functional Status Assessment  Patient has had a recent decline in their functional status and demonstrates the ability to make significant improvements in function in a reasonable and predictable amount of time.  Equipment Recommendations  Other (comment) (defer to next venue of care)       Precautions / Restrictions Precautions Precautions: Fall;Back Restrictions Weight Bearing Restrictions: No      Mobility Bed Mobility      General bed mobility comments: not assessed, pt in recliner at beginning/end of session    Transfers Overall transfer level: Needs assistance Equipment used: Rolling walker (2 wheels);2 person hand held assist Transfers: Sit to/from Stand Sit to Stand: Max assist             Balance Overall balance assessment: Needs assistance Sitting-balance support: Bilateral upper extremity supported;Feet supported Sitting balance-Leahy Scale: Poor Sitting balance - Comments: min assist static sitting, at times with anterior lean, BUE shaking/jerking impair balance   Standing balance support: Bilateral upper extremity supported;During functional activity Standing balance-Leahy Scale: Zero Standing balance comment: BUE support and MAX A required to maintain static standing balance                           ADL either performed or assessed with clinical judgement   ADL Overall ADL's : Needs assistance/impaired Eating/Feeding: Minimal assistance;Sitting;Cueing for safety Eating/Feeding Details (indicate cue type and reason): Pt able to bring cup to mouth, however fatigues  quickly, dropping cup and requiring MIN A to maintain grasp. Requires cues to take small sips Grooming: Wash/dry face;Oral care;Applying  deodorant;Minimal assistance;Sitting Grooming Details (indicate cue type and reason): Pt able to bring grooming items to face/underarm, however fatigues quickly, frequently dropping grooming tools and requiring MIN A to maintain grasp on items.                     Toileting- Clothing Manipulation and Hygiene: Maximal assistance;Sit to/from stand Toileting - Clothing Manipulation Details (indicate cue type and reason): MAX A for posterior peri-care             Vision Baseline Vision/History: 1 Wears glasses Patient Visual Report: No change from baseline              Pertinent Vitals/Pain Pain Assessment: No/denies pain        Extremity/Trunk Assessment Upper Extremity Assessment Upper Extremity Assessment: RUE deficits/detail;LUE deficits/detail RUE Deficits / Details: grossly 3-/5 in all movements LUE Deficits / Details: grossly 3-/5 in all movements. Some edema noted in L forearm/wrist   Lower Extremity Assessment Lower Extremity Assessment: Generalized weakness   Cervical / Trunk Assessment Cervical / Trunk Assessment: Kyphotic   Communication Communication Communication: Prefers language other than English;HOH (grandson present & assisted with interpreting)   Cognition Arousal/Alertness: Awake/alert Behavior During Therapy: Flat affect Overall Cognitive Status: Impaired/Different from baseline                                 General Comments: Pt alert and oriented to self, place, and year. Disoriented to date and situation. Pt presents with decreased awareness of deficits     General Comments  Pt incontinent of BM. Pt on 8L/min during session with SPO2 >90%.            Home Living Family/patient expects to be discharged to:: Private residence Living Arrangements: Alone Available Help at Discharge: Family;Available PRN/intermittently Type of Home: Mobile home Home Access: Stairs to enter Entrance Stairs-Number of Steps: 4 Entrance  Stairs-Rails: Right Home Layout: One level     Bathroom Shower/Tub: Tub/shower unit         Home Equipment: Cane - single Barista (2 wheels)   Additional Comments: Pt lives alone . Son and Daughter present and report "everyone works" but they assist intermittently as needed.      Prior Functioning/Environment Prior Level of Function : Independent/Modified Independent             Mobility Comments: Pt uses a RW for amb w/ MOD-I ADLs Comments: Daughters assist w/ food prep, groceries, and take to medical appointments        OT Problem List: Decreased strength;Decreased activity tolerance;Impaired balance (sitting and/or standing);Decreased safety awareness;Pain;Decreased knowledge of use of DME or AE      OT Treatment/Interventions: Self-care/ADL training;Manual therapy;Therapeutic exercise;Modalities;Patient/family education;Balance training;Energy conservation;Therapeutic activities;DME and/or AE instruction    OT Goals(Current goals can be found in the care plan section) Acute Rehab OT Goals Patient Stated Goal: to regain strength OT Goal Formulation: With patient/family Time For Goal Achievement: 09/02/21 Potential to Achieve Goals: Fair  OT Frequency: Min 2X/week   Barriers to D/C: Decreased caregiver support  Pt has family assist but they made it clear they are unable to provide 24/7 assist because they work. They are very supportive of pt but know their limitations.          AM-PAC OT "6 Clicks"  Daily Activity     Outcome Measure Help from another person eating meals?: A Little Help from another person taking care of personal grooming?: A Little Help from another person toileting, which includes using toliet, bedpan, or urinal?: A Lot Help from another person bathing (including washing, rinsing, drying)?: A Lot Help from another person to put on and taking off regular upper body clothing?: A Little Help from another person to put on and taking  off regular lower body clothing?: A Lot 6 Click Score: 15   End of Session Equipment Utilized During Treatment: Rolling walker (2 wheels) Nurse Communication: Mobility status  Activity Tolerance: Patient tolerated treatment well Patient left: in chair;with call bell/phone within reach;with chair alarm set;with family/visitor present  OT Visit Diagnosis: Unsteadiness on feet (R26.81);Repeated falls (R29.6);Muscle weakness (generalized) (M62.81);History of falling (Z91.81)                Time: 1541-1620 OT Time Calculation (min): 39 min Charges:  OT General Charges $OT Visit: 1 Visit OT Evaluation $OT Eval Moderate Complexity: 1 Mod OT Treatments $Self Care/Home Management : 23-37 mins  Fredirick Maudlin, OTR/L [place]

## 2021-08-20 DIAGNOSIS — N179 Acute kidney failure, unspecified: Secondary | ICD-10-CM | POA: Diagnosis not present

## 2021-08-20 DIAGNOSIS — I2510 Atherosclerotic heart disease of native coronary artery without angina pectoris: Secondary | ICD-10-CM | POA: Diagnosis not present

## 2021-08-20 DIAGNOSIS — N184 Chronic kidney disease, stage 4 (severe): Secondary | ICD-10-CM

## 2021-08-20 DIAGNOSIS — I5043 Acute on chronic combined systolic (congestive) and diastolic (congestive) heart failure: Secondary | ICD-10-CM | POA: Diagnosis not present

## 2021-08-20 DIAGNOSIS — R778 Other specified abnormalities of plasma proteins: Secondary | ICD-10-CM | POA: Diagnosis not present

## 2021-08-20 LAB — PROCALCITONIN: Procalcitonin: 1.08 ng/mL

## 2021-08-20 LAB — CBC
HCT: 25.2 % — ABNORMAL LOW (ref 39.0–52.0)
Hemoglobin: 8.1 g/dL — ABNORMAL LOW (ref 13.0–17.0)
MCH: 27.1 pg (ref 26.0–34.0)
MCHC: 32.1 g/dL (ref 30.0–36.0)
MCV: 84.3 fL (ref 80.0–100.0)
Platelets: 202 10*3/uL (ref 150–400)
RBC: 2.99 MIL/uL — ABNORMAL LOW (ref 4.22–5.81)
RDW: 16.7 % — ABNORMAL HIGH (ref 11.5–15.5)
WBC: 9.7 10*3/uL (ref 4.0–10.5)
nRBC: 0 % (ref 0.0–0.2)

## 2021-08-20 LAB — CREATININE, SERUM
Creatinine, Ser: 3.49 mg/dL — ABNORMAL HIGH (ref 0.61–1.24)
GFR, Estimated: 16 mL/min — ABNORMAL LOW (ref >60)

## 2021-08-20 LAB — POTASSIUM: Potassium: 3 mmol/L — ABNORMAL LOW (ref 3.5–5.1)

## 2021-08-20 MED ORDER — ENSURE ENLIVE PO LIQD
237.0000 mL | Freq: Three times a day (TID) | ORAL | Status: DC
  Administered 2021-08-20 – 2021-08-24 (×10): 237 mL via ORAL

## 2021-08-20 MED ORDER — SODIUM CHLORIDE 0.9 % IV SOLN
2.0000 g | INTRAVENOUS | Status: AC
  Administered 2021-08-20 – 2021-08-21 (×2): 2 g via INTRAVENOUS
  Filled 2021-08-20 (×2): qty 2

## 2021-08-20 MED ORDER — ADULT MULTIVITAMIN W/MINERALS CH
1.0000 | ORAL_TABLET | Freq: Every day | ORAL | Status: DC
  Administered 2021-08-20 – 2021-08-24 (×5): 1 via ORAL
  Filled 2021-08-20 (×5): qty 1

## 2021-08-20 MED ORDER — POTASSIUM CHLORIDE CRYS ER 20 MEQ PO TBCR
40.0000 meq | EXTENDED_RELEASE_TABLET | Freq: Once | ORAL | Status: AC
  Administered 2021-08-20: 40 meq via ORAL
  Filled 2021-08-20: qty 2

## 2021-08-20 NOTE — Progress Notes (Signed)
PROGRESS NOTE    Daniel Bolton  FGH:[id number] DOB: 19-Oct-1933 DOA: 08/11/2021 PCP: [name], MD   Chief Complaint  Patient presents with   Weakness    Brief Narrative: 87 y.o. Hispanic male with medical history significant for essential hypertension, CAD status post CABG, CHF, chronic kidney disease and CVA, who presented to the emergency room with acute onset   generalized weakness since Friday with diminished appetite and oral intake.  He's been found to have AKI as well as generalized weakness.  Being treated for rhabdo with IVF (caution given hx HF).  Other potential causes for his weakness being evaluated as noted below. Due to elevated LFT had abd US...>HIDA>>>MRCP which was negative. Was empirically initially started on Zosyn for possible acute cholecystitis by surgery , but w/u negative so was discontinued . Now he is in CHF. May need HD.     Assessment & Plan:   Active Problems:   Moderate major depression, single episode (HCC)   Coronary artery disease   AKI (acute kidney injury) ([place])   Normochromic normocytic anemia   Essential hypertension   Hyponatremia   Acute on chronic combined systolic and diastolic CHF (congestive heart failure) (HCC)   HLD (hyperlipidemia)   Hyperkalemia   AAA (abdominal aortic aneurysm)   Elevated troponin    AKI on chronic kidney disease stage IV.   Baseline creatinine 2.6 on 07/31/2021. UA with hg, but no RBC Elevated LFT's Suspect rhabdo (urine myoglobin positive) initially and now cardiorenal syndrome. 11/9 renal US suggesting med-renal dz. No hydron. Prostatomegaly. Renal ultrasound without hydronephrosis please see full report 11/15 was started on IV Lasix and metolazone due to acute systolic heart failure.  Now creatinine increasing. Nephrology following-no acute indication for dialysis at this time.   Lasix and metolazone discontinued Avoid nephrotoxic meds     Bilateral Lower Extremity Weakness   Generalized Weakness Unclear etiology, before Friday, up and walking with walker - after Friday, wasn't able to  Granddaughter noted incontinence of bowel as well as numbness in legs as well Weakness could be from neurologic causes vs rhabdo/myositis 11/9 mri with cervical spinal canal stenosis and neural foraminal narrowing-see reports. 11/15 continue lidocaine patch  PT OT consulted recommend SNF Follow-up PCP as outpatient     Hyperkalemia Now hypokalemia Initially treated with multiple doses of Kayexalate. Now hypokalemic due to IV diuretics 11/15 K 3.0 today. Will replace KCL Ck am lab    Rhabdomyolysis UA Heme + and only 0-5 RBC's He's on amlodipine/simvastatin -> increases risk of rhabdo (sounds like he's been on this combination for a while -> regimen should be adjusted at discharge) - continue to hold  TSH elevated mildly, follow free T4 11/10-ck trended significantly higher, 18000. Tea colored urine CRP elevated 11/11 rheumatology was consulted for possible myositis.  Appreciate their input.  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 Will order this lab 11/15 CK trended down. Off IVF due to CHF and volume overload. Avoid statins.  Discussed this with family 2.      Elevated LFT's 2/2 rhabdo  Negative acute hepatitis panel 11/9 abdominal ultrasound with cholelithiasis and findings suspicious for acute cholecystitis.  Dilated common bile duct suggestive of obstructing common bile duct stone.  MRI abdomen and MRCP can be helpful. Right pleural effusion 11/10 HIDA negative. GI/Surgery consulted MRCP with cholelithiasis without evidence of acute cholecystitis.  No evidence of cholelithiasis or biliary tract obstruction.  LFTs trending down-likely also due to CHF and improving with Lasix 11/15 Lasix was discontinued.  Discussed this with surgery and GI no further  input needed. Lasix now has been discontinued due to worsening creatinine. Continue to monitor.     Bilateral Pleural effusion Found on MRCP Volume overloaded, and 2/2 chf 11/15 Lasix will be discontinued as above Obtained cxr revealing now small if any pleural fluid. Possible pneumonia. Afebrile. Will ck cbc and procalcitonin.      Acute on Chronic combined systolic and diastolic HF EF 93-81%, global hypokinesis 04/2021, grade II diastolic dysfunction, moderately reduced RVSF  11/15 appears to be less volume overloaded Discontinuing IV Lasix and metolazone due to worsening creatinine Continue I's and O's Daily weight    Lumbar Stenosis with Radiculopathy Lumbar imaging obtained with concern of LE weakness + incontinence of stool C/T spine imaging obtained with concern for UE pain, weakness as well - follow with neurology NSGY rec considering decompression when medically stable Preoperative clearance per cardiology - high risk surgical candidate (see 11/8 note)  follow-up with PCP as outpatient    Elevated D dimer  RUE Swelling No evidence of DVT in RUE VQ scan pending -> indeterminate LE [place] - negative for DVT  less suspicious for PE based on his overall picture      CAD with hx CABG  Elevated Troponin Aspirin, plavix still being held in setting of GI bleed (was supposed to hold until reeval)  No CP, appreciate cardiology assistance - suspect demand ischemia Appreciate cardiology assistance  Sinus Bradycardia Currently metoprolol on hold HR as low as 40's documented outpatient  Appreciate cardiology recs (see 11/8 note, signing off)    Hypertension Holding BP meds due to BP being low.monitor if resume if indicated.   HLD Holding simvastatin given elevated CK and on amlodipine, if resuming statin at discharge should resume alternative statin or simvastatin at lower dose (after CK, LFT's improved)  GERD  Hx GI Bleed  Reflux Esophagitis  Gastric Ulcer   Erosive Gastropathy  Duodenal Ulcers PPI BID  DVT prophylaxis: heparin Code Status: full Family Communication: family at bedside Disposition:   Status is: Inpatient  Remains inpatient appropriate because: needs IV treatment, in acute HF< worsening renal failure, ck elevated. Pt is sick and will transfer to PCU for higher level of care/monitoring      Consultants:  Renal Cardiology GI, surgery rheumatology  Procedures No DVT in RUE  Antimicrobials:  Anti-infectives (From admission, onward)    Start     Dose/Rate Route Frequency Ordered Stop   08/14/21 1800  piperacillin-tazobactam (ZOSYN) IVPB 2.25 g  Status:  Discontinued        2.25 g 100 mL/hr over 30 Minutes Intravenous Every 6 hours 08/14/21 1648 08/19/21 1451          Subjective: Less sob. No cp. No dizziness.    Objective: Vitals:   08/19/21 2329 08/20/21 0428 08/20/21 0631 08/20/21 0736  BP: (!) 126/47 (!) 135/51  (!) 132/51  Pulse: 66 64  68  Resp: 18 20    Temp: 97.7 F (36.5 C) 97.6 F (36.4 C)  97.8 F (36.6 C)  TempSrc:    Oral  SpO2: 100% 100%  100%  Weight:   64 kg   Height:        Intake/Output Summary (Last 24 hours) at 08/20/2021 0916 Last data filed at 08/20/2021 0635 Gross per 24 hour  Intake 480 ml  Output 900 ml  Net -420 ml  Filed Weights   08/11/21 1921 08/19/21 0206 08/20/21 0631  Weight: 59.9 kg 64.8 kg 64 kg    Examination: Appears tired and weak +crackles b/l Reg s1/s2 no gallop Soft benign +bs +edema, but less than before [id number]   Data Reviewed: I have personally reviewed following labs and imaging studies  CBC: Recent Labs  Lab 08/14/21 0553 08/18/21 0901  WBC 11.5* 13.6*  NEUTROABS 9.6*  --   HGB 9.3* 8.2*  HCT 28.4* 25.2*  MCV 84.5 84.8  PLT 223 106    Basic Metabolic Panel: Recent Labs  Lab 08/14/21 0553 08/15/21 0423 08/16/21 0633 08/17/21 0638 08/18/21 0901 08/19/21 0457  NA 131* 135 136 139 137 141  K 6.0* 5.7* 4.9 4.0 3.3* 3.8   CL 103 107 105 105 105 107  CO2 18* 17* 22 20* 22 22  GLUCOSE 113* 83 137* 93 152* 108*  BUN 118* 68* 113* 118* 131* 142*  CREATININE 3.01* 3.42* 3.28* 3.33* 3.10* 3.19*  CALCIUM 8.3* 8.1* 8.0* 7.7* 7.6* 8.0*  MG 2.4  --   --   --   --   --   PHOS 7.5*  --   --   --   --   --     GFR: Estimated Creatinine Clearance: 13.6 mL/min (A) (by C-G formula based on SCr of 3.19 mg/dL (H)).  Liver Function Tests: Recent Labs  Lab 08/15/21 0423 08/16/21 2694 08/17/21 0638 08/18/21 0901 08/19/21 0457  AST 798* 544* 415* 271* 183*  ALT 458* 407* 416* 356* 287*  ALKPHOS 143* 170* 196* 202* 231*  BILITOT 0.9 1.1 1.7* 1.8* 1.8*  PROT 5.5* 5.7* 5.9* 6.3* 5.8*  ALBUMIN 2.1* 1.8* 2.0* 2.1* 2.0*    CBG: No results for input(s): GLUCAP in the last 168 hours.    Recent Results (from the past 240 hour(s))  Resp Panel by RT-PCR (Flu A&B, Covid) Nasopharyngeal Swab     Status: None   Collection Time: 08/12/21 12:03 AM   Specimen: Nasopharyngeal Swab; Nasopharyngeal(NP) swabs in vial transport medium  Result Value Ref Range Status   SARS Coronavirus 2 by RT PCR NEGATIVE NEGATIVE Final    Comment: (NOTE) SARS-CoV-2 target nucleic acids are NOT DETECTED.  The SARS-CoV-2 RNA is generally detectable in upper respiratory specimens during the acute phase of infection. The lowest concentration of SARS-CoV-2 viral copies this assay can detect is 138 copies/mL. A negative result does not preclude SARS-Cov-2 infection and should not be used as the sole basis for treatment or other patient management decisions. A negative result may occur with  improper specimen collection/handling, submission of specimen other than nasopharyngeal swab, presence of viral mutation(s) within the areas targeted by this assay, and inadequate number of viral copies(<138 copies/mL). A negative result must be combined with clinical observations, patient history, and epidemiological information. The expected result is  Negative.  Fact Sheet for Patients:  EntrepreneurPulse.com.au  Fact Sheet for Healthcare Providers:  IncredibleEmployment.be  This test is no t yet approved or cleared by the [place] FDA and  has been authorized for detection and/or diagnosis of SARS-CoV-2 by FDA under an Emergency Use Authorization (EUA). This EUA will remain  in effect (meaning this test can be used) for the duration of the COVID-19 declaration under Section 564(b)(1) of the Act, 21 U.S.C.section 360bbb-3(b)(1), unless the authorization is terminated  or revoked sooner.       Influenza A by PCR NEGATIVE NEGATIVE Final   Influenza B by PCR NEGATIVE NEGATIVE Final  Comment: (NOTE) The Xpert Xpress SARS-CoV-2/FLU/RSV plus assay is intended as an aid in the diagnosis of influenza from Nasopharyngeal swab specimens and should not be used as a sole basis for treatment. Nasal washings and aspirates are unacceptable for Xpert Xpress SARS-CoV-2/FLU/RSV testing.  Fact Sheet for Patients: EntrepreneurPulse.com.au  Fact Sheet for Healthcare Providers: IncredibleEmployment.be  This test is not yet approved or cleared by the [place] FDA and has been authorized for detection and/or diagnosis of SARS-CoV-2 by FDA under an Emergency Use Authorization (EUA). This EUA will remain in effect (meaning this test can be used) for the duration of the COVID-19 declaration under Section 564(b)(1) of the Act, 21 U.S.C. section 360bbb-3(b)(1), unless the authorization is terminated or revoked.  Performed at [facility], [address] [place], [place] [address]          Radiology Studies: DG Chest [place] 1 View  Result Date: 08/19/2021 CLINICAL DATA:  Weakness EXAM: PORTABLE CHEST 1 VIEW COMPARISON:  08/12/2021 FINDINGS: Diffuse hazy opacification of the chest, progressed. There is history of CHF and this could be pulmonary edema,  but pneumonia is also considered. Small if any pleural fluid. Cardiomegaly with prior CABG. IMPRESSION: Worsening generalized airspace disease. Electronically Signed   By: Jorje Guild M.D.   On: 08/19/2021 11:48        Scheduled Meds:  feeding supplement  237 mL Oral BID BM   heparin injection (subcutaneous)  5,000 Units Subcutaneous Q8H   lidocaine  1 patch Transdermal Q24H   pantoprazole  40 mg Oral BID   polyethylene glycol  17 g Oral Daily   senna-docusate  1 tablet Oral BID   Continuous Infusions:     LOS: 8 days    Time spent: 35 minutes with more than 50% on COC    Nolberto Hanlon, MD [facility]   To contact the attending provider between 7A-7P or the covering provider during after hours 7P-7A, please log into the web site [email or web address] and access using universal [place] password for that web site. If you do not have the password, please call the hospital operator.  08/20/2021, 9:16 AM

## 2021-08-20 NOTE — Care Management Important Message (Signed)
Important Message  Patient Details  Name: Daniel Bolton MRN: [id number] Date of Birth: Nov 30, 1933   Medicare Important Message Given:  Yes     [name] 08/20/2021, 2:21 PM

## 2021-08-20 NOTE — Progress Notes (Signed)
[facility]  PROGRESS NOTE   Subjective:   History taken with assistance of Chief Operating Officer.   UOP 943mL (1446mL) Creatinine 3.49 (3.19) (3.1)  Patient still feels fluid overloaded.   Family at bedside.   Objective:  Vital signs in last 24 hours:  Temp:  [97.4 F (36.3 C)-98 F (36.7 C)] 98 F (36.7 C) (11/15 1130) Pulse Rate:  [62-69] 62 (11/15 1130) Resp:  [18-20] 20 (11/15 0428) BP: (126-139)/(47-56) 139/56 (11/15 1130) SpO2:  [99 %-100 %] 100 % (11/15 1130) Weight:  [64 kg] 64 kg (11/15 0631)  Weight change: -0.8 kg Filed Weights   08/11/21 1921 08/19/21 0206 08/20/21 0631  Weight: 59.9 kg 64.8 kg 64 kg    Intake/Output: I/O last 3 completed shifts: In: 480 [P.O.:480] Out: 1700 [Urine:1700]   Intake/Output this shift:  No intake/output data recorded.  Physical Exam: General:  No acute distress, cachectic  Head:  Normocephalic, atraumatic. Moist oral mucosal membranes  Eyes:  Anicteric  Neck:  Supple  Lungs:   Clear to auscultation, normal effort  Heart:  regular  Abdomen:   Soft, nontender, bowel sounds present  Extremities:  +peripheral edema.  Neurologic:  Awake, alert, following commands  Skin:  No lesions        Basic Metabolic Panel: Recent Labs  Lab 08/14/21 0553 08/15/21 0423 08/16/21 2841 08/17/21 0638 08/18/21 0901 08/19/21 0457 08/20/21 0939  NA 131* 135 136 139 137 141  --   K 6.0* 5.7* 4.9 4.0 3.3* 3.8 3.0*  CL 103 107 105 105 105 107  --   CO2 18* 17* 22 20* 22 22  --   GLUCOSE 113* 83 137* 93 152* 108*  --   BUN 118* 68* 113* 118* 131* 142*  --   CREATININE 3.01* 3.42* 3.28* 3.33* 3.10* 3.19* 3.49*  CALCIUM 8.3* 8.1* 8.0* 7.7* 7.6* 8.0*  --   MG 2.4  --   --   --   --   --   --   PHOS 7.5*  --   --   --   --   --   --      CBC: Recent Labs  Lab 08/14/21 0553 08/18/21 0901  WBC 11.5* 13.6*  NEUTROABS 9.6*  --   HGB 9.3* 8.2*  HCT 28.4* 25.2*  MCV 84.5 84.8  PLT 223 234      Urinalysis: No  results for input(s): COLORURINE, LABSPEC, PHURINE, GLUCOSEU, HGBUR, BILIRUBINUR, KETONESUR, PROTEINUR, UROBILINOGEN, NITRITE, LEUKOCYTESUR in the last 72 hours.  Invalid input(s): APPERANCEUR    Imaging: DG Chest Port 1 View  Result Date: 08/19/2021 CLINICAL DATA:  Weakness EXAM: PORTABLE CHEST 1 VIEW COMPARISON:  08/12/2021 FINDINGS: Diffuse hazy opacification of the chest, progressed. There is history of CHF and this could be pulmonary edema, but pneumonia is also considered. Small if any pleural fluid. Cardiomegaly with prior CABG. IMPRESSION: Worsening generalized airspace disease. Electronically Signed   By: Jorje Guild M.D.   On: 08/19/2021 11:48     Medications:      feeding supplement  237 mL Oral BID BM   heparin injection (subcutaneous)  5,000 Units Subcutaneous Q8H   lidocaine  1 patch Transdermal Q24H   pantoprazole  40 mg Oral BID   polyethylene glycol  17 g Oral Daily   senna-docusate  1 tablet Oral BID    Assessment/ Plan:     Active Problems:   Moderate major depression, single episode (HCC)   Coronary artery disease  AKI (acute kidney injury) ([place])   Normochromic normocytic anemia   Essential hypertension   Hyponatremia   Acute on chronic combined systolic and diastolic CHF (congestive heart failure) (HCC)   HLD (hyperlipidemia)   Hyperkalemia   AAA (abdominal aortic aneurysm)   Elevated troponin  Mr. Ewen Varnell is a 87 y.o. Hispanic male with hypertension, coronary artery disease status post CABG, congestive heart failure, cerebrovascular accident, who is admitted to [facility] on 08/11/2021 for Shortness of breath [R06.02] Hyperkalemia [E87.5] Positive D dimer [R79.89] Elevated troponin [R77.8] Swelling of arm [M79.89] Generalized weakness [R53.1] AKI (acute kidney injury) ([place]) [N17.9]   Acute kidney injury on chronic kidney disease stage IV: baseline creatinine of 2.6, GFR of 24 on 07/31/2021. Followed by Dr. Lanora Manis. No acute indication  for dialysis.Acute kidney injury secondary to rhabdomyolysis and acute cardiorenal syndrome. Now with concerns of overdiuresis - hold diuretics  - discussed dialysis with patient and his family.   Hypertension and acute exacerbation of systolic and diastolic congestive heart failure.  - Discontinue furosemide and metolazone.   Anemia with chronic kidney disease: Hemoglobin 8.2. normocytic. Patient has not received ESA.   Rhabdomyolysis: holding statins.   Hypokalemia: - PO Potassium replacement.    LOS: [place], [facility] 11/15/20221:00 PM

## 2021-08-20 NOTE — TOC Initial Note (Signed)
Transition of Care [facility]) - Initial/Assessment Note    Patient Details  Name: Daniel Bolton MRN: [id number] Date of Birth: Aug 07, 1934  [facility]) CM/SW Contact:    Alberteen Sam, LCSW Phone Number: 08/20/2021, 1:24 PM  Clinical Narrative:                  CSW spoke with patient granddaughter Arianna regarding SNF recommendation from  PT/OT. She reports family has been in discussion regarding this and is in agreement. States that  she has no preference of facility and agreeable to CSW to fax out referrals.   Referrals faxed out to SNFs pending bed offers at this time.     Expected Discharge Plan: Skilled Nursing Facility Barriers to Discharge: Continued Medical Work up   Patient Goals and CMS Choice Patient states their goals for this hospitalization and ongoing recovery are:: to go home CMS Medicare.gov Compare Post Acute Care list provided to:: Patient Represenative (must comment) (granddaughter [place] Choice offered to / list presented to : Patient  Expected Discharge Plan and Services Expected Discharge Plan: [place]   Discharge Planning Services: CM Consult Post Acute Care Choice: [place] Living arrangements for the past 2 months: Single Family Home                 DME Arranged: N/A DME Agency: NA       HH Arranged: NA          Prior Living Arrangements/Services Living arrangements for the past 2 months: Single Family Home Lives with:: Self Patient language and need for interpreter reviewed:: Yes Do you feel safe going back to the place where you live?: No   needs short term rehab  Need for Family Participation in Patient Care: Yes (Comment) Care giver support system in place?: Yes (comment) Current home services: DME (cane and walker) Criminal Activity/Legal Involvement Pertinent to Current Situation/Hospitalization: No - Comment as needed  Activities of Daily Living Home Assistive Devices/Equipment:  Walker (specify type) ADL Screening (condition at time of admission) Patient's cognitive ability adequate to safely complete daily activities?: Yes Is the patient deaf or have difficulty hearing?: Yes Does the patient have difficulty seeing, even when wearing glasses/contacts?: No Does the patient have difficulty concentrating, remembering, or making decisions?: No Patient able to express need for assistance with ADLs?: Yes Does the patient have difficulty dressing or bathing?: Yes Independently performs ADLs?: No Does the patient have difficulty walking or climbing stairs?: Yes Weakness of Legs: Both Weakness of Arms/Hands: None  Permission Sought/Granted Permission sought to share information with : Case Manager, Customer service manager, Family Supports Permission granted to share information with : Yes, Verbal Permission Granted  Share Information with NAME: Arianna  Permission granted to share info w AGENCY: SNF  Permission granted to share info w Relationship: granddaughter  Permission granted to share info w Contact Information: [phone]  Emotional Assessment Appearance:: Appears stated age Attitude/Demeanor/Rapport: Engaged Affect (typically observed): Accepting Orientation: : Fluctuating Orientation (Suspected and/or reported Sundowners) Alcohol / Substance Use: Not Applicable Psych Involvement: No (comment)  Admission diagnosis:  Shortness of breath [R06.02] Hyperkalemia [E87.5] Positive D dimer [R79.89] Elevated troponin [R77.8] Swelling of arm [M79.89] Generalized weakness [R53.1] AKI (acute kidney injury) ([place]) [N17.9] Patient Active Problem List   Diagnosis Date Noted   Acute GI bleeding 06/14/2021   Acute on chronic combined systolic and diastolic CHF (congestive heart failure) ([place]) 06/04/2021   Abdominal pain 06/04/2021   Constipation 06/04/2021  Left hip pain 06/04/2021   Sinus bradycardia 06/04/2021   Stroke (HCC)    HLD (hyperlipidemia)     Hyperkalemia    AAA (abdominal aortic aneurysm)    Elevated troponin    Protein-calorie malnutrition, severe 04/12/2021   Essential hypertension 03/22/2021   Acute on chronic diastolic CHF (congestive heart failure) ([place]) 03/22/2021   Hyponatremia 03/22/2021   Acute on chronic systolic CHF (congestive heart failure) ([place]) 03/11/2021   Hypertensive urgency 03/11/2021   AKI (acute kidney injury) ([place]) 03/11/2021   Normochromic normocytic anemia 03/11/2021   Acute CHF (congestive heart failure) ([place]) 03/11/2021   Moderate major depression, single episode ([place]) 08/01/2015   Coronary artery disease 08/01/2015   Financial problems 08/01/2015   PCP:  [name], MD Pharmacy:   [id number] [place], [place] [place] [address] Phone: [phone] Fax: [place] [id number], [place] [address] Phone: [phone] Fax: [phone]     Social Determinants of Health (SDOH) Interventions    Readmission Risk Interventions Readmission Risk Prevention Plan 08/13/2021 06/18/2021  Transportation Screening Complete Complete  Medication Review Press photographer) Complete Complete  PCP or Specialist appointment within 3-5 days of discharge Complete Complete  HRI or Home Care Consult Complete Complete  SW Recovery Care/Counseling Consult Complete Complete  Palliative Care Screening Not Applicable Not [place] Complete Not Applicable  Some recent data might be hidden

## 2021-08-20 NOTE — Progress Notes (Signed)
Pharmacy Antibiotic Note  Daniel Bolton is a 87 y.o. male with medical history significant for essential hypertension, CAD status post CABG, CHF, chronic kidney disease and CVA, who presented to the emergency room with acute onset generalized weakness and was admitted on 08/11/2021. Pt found to have rhabdomyolysis ISO DDI PTA c/b severe AKI and LFT elevations. Pt with AKI; nephro following - no acute need for dialysis.  Pharmacy originally consulted for Zosyn dosing w/ c/f intra-abdominal infection that over the course was ruled out via HIDA and MRCP. Now will plan to finish course as HCAP treatment with Cefepime.   Plan: Completed 6d of Zosyn (ended 11/14); per d/w MD would like to continue additional days of HCAP coverage per chest CT. Initiate cefepime 2g IV q24h  Monitor renal function and adjust dose as clinically indicated  Height: 5\' 1"  (154.9 cm) Weight: 64 kg (141 lb 1.5 oz) IBW/kg (Calculated) : 52.3  Temp (24hrs), Avg:97.7 F (36.5 C), Min:97.4 F (36.3 C), Max:98 F (36.7 C)  Recent Labs  Lab 08/14/21 0553 08/15/21 0423 08/16/21 6387 08/17/21 5643 08/18/21 0901 08/19/21 0457 08/20/21 0939 08/20/21 1511  WBC 11.5*  --   --   --  13.6*  --   --  9.7  CREATININE 3.01*   < > 3.28* 3.33* 3.10* 3.19* 3.49*  --    < > = values in this interval not displayed.     Estimated Creatinine Clearance: 12.5 mL/min (A) (by C-G formula based on SCr of 3.49 mg/dL (H)).    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    Antimicrobials this admission: + Zosyn (11/9 PM - 11/14 PM)                +Cefepime (11/15 PM >>  No Cultures collected  Thank you for allowing pharmacy to be a part of this patient's care.  Lorna Dibble 08/20/2021 3:32 PM

## 2021-08-20 NOTE — NC FL2 (Signed)
[place] LEVEL OF CARE SCREENING TOOL     IDENTIFICATION  Patient Name: Daniel Bolton Birthdate: Mar 04, 1934 Sex: male Admission Date (Current Location): 08/11/2021  [facility] [place] Number:  Engineering geologist and Address:  [facility], [address] [place], [place] [address]      Provider Number: [id number]  Attending Physician Name and Address:  Nolberto Hanlon, MD  Relative Name and Phone Number:  [name] (granddaughter) [phone]    Current Level of Care: Hospital Recommended Level of Care: [place] Prior Approval Number:    Date Approved/Denied:   PASRR Number: [id number]  Discharge Plan:      Current Diagnoses: Patient Active Problem List   Diagnosis Date Noted   Acute GI bleeding 06/14/2021   Acute on chronic combined systolic and diastolic CHF (congestive heart failure) ([place]) 06/04/2021   Abdominal pain 06/04/2021   Constipation 06/04/2021   Left hip pain 06/04/2021   Sinus bradycardia 06/04/2021   Stroke ([place])    HLD (hyperlipidemia)    Hyperkalemia    AAA (abdominal aortic aneurysm)    Elevated troponin    Protein-calorie malnutrition, severe 04/12/2021   Essential hypertension 03/22/2021   Acute on chronic diastolic CHF (congestive heart failure) ([place]) 03/22/2021   Hyponatremia 03/22/2021   Acute on chronic systolic CHF (congestive heart failure) ([place]) 03/11/2021   Hypertensive urgency 03/11/2021   AKI (acute kidney injury) ([place]) 03/11/2021   Normochromic normocytic anemia 03/11/2021   Acute CHF (congestive heart failure) ([place]) 03/11/2021   Moderate major depression, single episode ([place]) 08/01/2015   Coronary artery disease 08/01/2015   Financial problems 08/01/2015    Orientation RESPIRATION BLADDER Height & Weight     Self  O2 (nasal cannula 8L) Incontinent, External catheter Weight: 141 lb 1.5 oz (64 kg) Height:  5\' 1"  (154.9 cm)  BEHAVIORAL SYMPTOMS/MOOD NEUROLOGICAL  BOWEL NUTRITION STATUS      Incontinent Diet (see discharge summary)  AMBULATORY STATUS COMMUNICATION OF NEEDS Skin   Extensive Assist Verbally Normal                       Personal Care Assistance Level of Assistance  Bathing, Feeding, Dressing, Total care Bathing Assistance: Maximum assistance Feeding assistance: Maximum assistance Dressing Assistance: Maximum assistance Total Care Assistance: Maximum assistance   Functional Limitations Info  Sight, Hearing, Speech Sight Info: Impaired Hearing Info: Impaired Speech Info: Adequate    SPECIAL CARE FACTORS FREQUENCY  PT (By licensed PT), OT (By licensed OT)     PT Frequency: min 4x weekly OT Frequency: min 4x weekly            Contractures Contractures Info: Not present    Additional Factors Info  Code Status, Allergies Code Status Info: full Allergies Info: statins, hydralazine           Current Medications (08/20/2021):  This is the current hospital active medication list Current Facility-Administered Medications  Medication Dose Route Frequency Provider Last Rate Last Admin   acetaminophen (TYLENOL) tablet 650 mg  650 mg Oral Q6H PRN Mansy, Jan A, MD   650 mg at 08/13/21 1103   Or   acetaminophen (TYLENOL) suppository 650 mg  650 mg Rectal Q6H PRN Mansy, Jan A, MD       feeding supplement (ENSURE ENLIVE / ENSURE PLUS) liquid 237 mL  237 mL Oral BID BM Mansy, Jan A, MD   237 mL at 08/20/21 1053   heparin injection 5,000 Units  5,000 Units Subcutaneous Q8H Nolberto Hanlon, MD   5,000 Units at 08/20/21 0629   lidocaine (LIDODERM) 5 % 1 patch  1 patch Transdermal Q24H Nolberto Hanlon, MD   1 patch at 08/20/21 1053   magnesium hydroxide (MILK OF MAGNESIA) suspension 30 mL  30 mL Oral Daily PRN Mansy, Jan A, MD       morphine 2 MG/ML injection 2 mg  2 mg Intravenous Q4H PRN Mansy, Jan A, MD   2 mg at 08/19/21 0344   ondansetron (ZOFRAN) tablet 4 mg  4 mg Oral Q6H PRN Mansy, Jan A, MD       Or   ondansetron [facility])  injection 4 mg  4 mg Intravenous Q6H PRN Mansy, Jan A, MD       oxyCODONE (Oxy IR/ROXICODONE) immediate release tablet 5 mg  5 mg Oral Q6H PRN Elodia Florence., MD   5 mg at 08/19/21 2157   pantoprazole (PROTONIX) EC tablet 40 mg  40 mg Oral BID Mansy, Jan A, MD   40 mg at 08/20/21 1053   polyethylene glycol (MIRALAX / GLYCOLAX) packet 17 g  17 g Oral Daily Nolberto Hanlon, MD   17 g at 08/19/21 1015   potassium chloride SA (KLOR-CON) CR tablet 40 mEq  40 mEq Oral Once Kolluru, Lurena Nida, MD       senna-docusate (Senokot-S) tablet 1 tablet  1 tablet Oral BID Nolberto Hanlon, MD   1 tablet at 08/19/21 2158   traZODone (DESYREL) tablet 25 mg  25 mg Oral QHS PRN Mansy, Jan A, MD   25 mg at 08/18/21 2237     Discharge Medications: Please see discharge summary for a list of discharge medications.  Relevant Imaging Results:  Relevant Lab Results:   Additional Information SSN: [id number]  Alberteen Sam, LCSW

## 2021-08-20 NOTE — Plan of Care (Signed)
  Problem: Education: Goal: Knowledge of General Education information will improve Description: Including pain rating scale, medication(s)/side effects and non-pharmacologic comfort measures 08/20/2021 1222 by Cristela Blue, RN Outcome: Progressing 08/20/2021 1222 by Cristela Blue, RN Outcome: Progressing   Problem: Health Behavior/Discharge Planning: Goal: Ability to manage health-related needs will improve 08/20/2021 1222 by Cristela Blue, RN Outcome: Progressing 08/20/2021 1222 by Cristela Blue, RN Outcome: Progressing   Problem: Clinical Measurements: Goal: Ability to maintain clinical measurements within normal limits will improve 08/20/2021 1222 by Cristela Blue, RN Outcome: Progressing 08/20/2021 1222 by Cristela Blue, RN Outcome: Progressing Goal: Will remain free from infection 08/20/2021 1222 by Cristela Blue, RN Outcome: Progressing 08/20/2021 1222 by Cristela Blue, RN Outcome: Progressing Goal: Diagnostic test results will improve 08/20/2021 1222 by Cristela Blue, RN Outcome: Progressing 08/20/2021 1222 by Cristela Blue, RN Outcome: Progressing Goal: Respiratory complications will improve 08/20/2021 1222 by Cristela Blue, RN Outcome: Progressing 08/20/2021 1222 by Cristela Blue, RN Outcome: Progressing Goal: Cardiovascular complication will be avoided 08/20/2021 1222 by Cristela Blue, RN Outcome: Progressing 08/20/2021 1222 by Cristela Blue, RN Outcome: Progressing   Problem: Activity: Goal: Risk for activity intolerance will decrease 08/20/2021 1222 by Cristela Blue, RN Outcome: Progressing 08/20/2021 1222 by Cristela Blue, RN Outcome: Progressing   Problem: Nutrition: Goal: Adequate nutrition will be maintained 08/20/2021 1222 by Cristela Blue, RN Outcome: Progressing 08/20/2021 1222 by Cristela Blue, RN Outcome: Progressing   Problem: Coping: Goal: Level of anxiety will decrease 08/20/2021 1222 by Cristela Blue, RN Outcome:  Progressing 08/20/2021 1222 by Cristela Blue, RN Outcome: Progressing   Problem: Elimination: Goal: Will not experience complications related to bowel motility 08/20/2021 1222 by Cristela Blue, RN Outcome: Progressing 08/20/2021 1222 by Cristela Blue, RN Outcome: Progressing Goal: Will not experience complications related to urinary retention 08/20/2021 1222 by Cristela Blue, RN Outcome: Progressing 08/20/2021 1222 by Cristela Blue, RN Outcome: Progressing   Problem: Pain Managment: Goal: General experience of comfort will improve 08/20/2021 1222 by Cristela Blue, RN Outcome: Progressing 08/20/2021 1222 by Cristela Blue, RN Outcome: Progressing   Problem: Safety: Goal: Ability to remain free from injury will improve 08/20/2021 1222 by Cristela Blue, RN Outcome: Progressing 08/20/2021 1222 by Cristela Blue, RN Outcome: Progressing   Problem: Skin Integrity: Goal: Risk for impaired skin integrity will decrease 08/20/2021 1222 by Cristela Blue, RN Outcome: Progressing 08/20/2021 1222 by Cristela Blue, RN Outcome: Progressing

## 2021-08-20 NOTE — Progress Notes (Signed)
Initial Nutrition Assessment  DOCUMENTATION CODES:   Non-severe (moderate) malnutrition in context of chronic illness  INTERVENTION:   -Ensure Enlive po TID, each supplement provides 350 kcal and 20 grams of protein  -Magic cup TID with meals, each supplement provides 290 kcal and 9 grams of protein  -MVI with minerals daily -Vital Cuisine Shake TID, each supplement provides 520 kcals and 22 grams protein  NUTRITION DIAGNOSIS:   Moderate Malnutrition related to chronic illness (CHF) as evidenced by mild fat depletion, moderate fat depletion, moderate muscle depletion.  GOAL:   Patient will meet greater than or equal to 90% of their needs  MONITOR:   PO intake, Supplement acceptance, Diet advancement, Labs, Weight trends, Skin, I & O's  REASON FOR ASSESSMENT:   Rounds    ASSESSMENT:   87 y.o. Hispanic male with medical history significant for essential hypertension, CAD status post CABG, CHF, chronic kidney disease and CVA, who presented to the emergency room with acute onset   generalized weakness since Friday with diminished appetite and oral intake.  Pt admitted with AKI.   Reviewed I/O's: -420 ml x 24 hours and +295 ml since admission  UOP: 900 ml x 24 hours  Spoke with pt, daughter, and son-in-law at bedside. Son-in-law reports that he has had decreased oral intake since hospitalization. Family members have been feeding pt and he enjoys Ensure, [employer], and applesauce. Noted meal completions 25-50%.   Per pt son, pt had a good appetite PTA. He usually consumes 2-3 meals per day (fish/chicken and vegetables). He did not follow a special diet at home.   Discussed importance of good meal and supplement intake to promote healing. Pt amenable to [employer] and Ensure. Pt drinking 1-2 Ensure supplements daily PTA.   Medications reviewed and include miralax, KCl, and senokot.   Labs reviewed: K: 3.    NUTRITION - FOCUSED PHYSICAL EXAM:  Flowsheet Row Most Recent  Value  Orbital Region Moderate depletion  Upper Arm Region Mild depletion  Thoracic and Lumbar Region Mild depletion  Buccal Region Mild depletion  Temple Region Moderate depletion  Clavicle Bone Region Severe depletion  Clavicle and Acromion Bone Region Severe depletion  Scapular Bone Region Severe depletion  Dorsal Hand Moderate depletion  Patellar Region No depletion  Anterior Thigh Region No depletion  Posterior Calf Region No depletion  Edema (RD Assessment) Moderate  Hair Reviewed  Eyes Reviewed  Mouth Reviewed  Skin Reviewed  Nails Reviewed       Diet Order:   Diet Order             DIET - DYS 1 Room service appropriate? Yes with Assist; Fluid consistency: Thin; Fluid restriction: 1200 mL Fluid  Diet effective now                   EDUCATION NEEDS:   Education needs have been addressed  Skin:  Skin Assessment: Reviewed RN Assessment  Last BM:  08/20/21  Height:   Ht Readings from Last 1 Encounters:  08/11/21 5\' 1"  (1.549 m)    Weight:   Wt Readings from Last 1 Encounters:  08/20/21 64 kg    Ideal Body Weight:  50.9 kg  BMI:  Body mass index is 26.66 kg/m.  Estimated Nutritional Needs:   Kcal:  1700-1900  Protein:  85-100 grams  Fluid:  > 1.7 L    Loistine Chance, RD, LDN, [place] Registered Dietitian II Certified Diabetes Care and Education Specialist Please refer to [facility] for  RD and/or RD on-call/weekend/after hours pager

## 2021-08-21 DIAGNOSIS — E440 Moderate protein-calorie malnutrition: Secondary | ICD-10-CM | POA: Diagnosis present

## 2021-08-21 DIAGNOSIS — N179 Acute kidney failure, unspecified: Secondary | ICD-10-CM | POA: Diagnosis not present

## 2021-08-21 LAB — RENAL FUNCTION PANEL
Albumin: 2 g/dL — ABNORMAL LOW (ref 3.5–5.0)
Anion gap: 10 (ref 5–15)
BUN: 128 mg/dL — ABNORMAL HIGH (ref 8–23)
CO2: 24 mmol/L (ref 22–32)
Calcium: 8.1 mg/dL — ABNORMAL LOW (ref 8.9–10.3)
Chloride: 104 mmol/L (ref 98–111)
Creatinine, Ser: 3.18 mg/dL — ABNORMAL HIGH (ref 0.61–1.24)
GFR, Estimated: 18 mL/min — ABNORMAL LOW (ref >60)
Glucose, Bld: 117 mg/dL — ABNORMAL HIGH (ref 70–99)
Phosphorus: 5.1 mg/dL — ABNORMAL HIGH (ref 2.5–4.6)
Potassium: 3.1 mmol/L — ABNORMAL LOW (ref 3.5–5.1)
Sodium: 138 mmol/L (ref 135–145)

## 2021-08-21 LAB — CREATININE, SERUM
Creatinine, Ser: 3.03 mg/dL — ABNORMAL HIGH (ref 0.61–1.24)
GFR, Estimated: 20 mL/min — ABNORMAL LOW (ref >60)

## 2021-08-21 LAB — MAGNESIUM: Magnesium: 2.2 mg/dL (ref 1.7–2.4)

## 2021-08-21 LAB — POTASSIUM: Potassium: 2.9 mmol/L — ABNORMAL LOW (ref 3.5–5.1)

## 2021-08-21 MED ORDER — POTASSIUM CHLORIDE CRYS ER 20 MEQ PO TBCR
40.0000 meq | EXTENDED_RELEASE_TABLET | ORAL | Status: AC
  Administered 2021-08-21 (×2): 40 meq via ORAL
  Filled 2021-08-21 (×3): qty 2

## 2021-08-21 NOTE — Plan of Care (Signed)

## 2021-08-21 NOTE — Progress Notes (Signed)
Physical Therapy Treatment Patient Details Name: Daniel Bolton MRN: [id number] DOB: 1934/09/19 Today's Date: 08/21/2021   History of Present Illness Pt is an 87 y/o M admitted on 08/11/21 after presenting to the ED with acute onset of generalized weakness with diminished appetite & oral intake. Pt with 1 fall, sliding down to the floor prior to admission. MRI results show several protruding disc in lumbar region. Pt was also found to have AKI & being treated for rhabdo with IVF. Pt started requiring HFNC & was transferred to higher level of care. PT has been re-consulted for re-evaluation. PMH: HTN, CAD s/p CABG, CHF, CKD, CVA       Recommendations for follow up therapy are one component of a multi-disciplinary discharge planning process, led by the attending physician.  Recommendations may be updated based on patient status, additional functional criteria and insurance authorization.  Follow Up Recommendations  Skilled nursing-short term rehab (<3 hours/day)     Assistance Recommended at Discharge Frequent or constant Supervision/Assistance  Equipment Recommendations  None recommended by PT       Precautions / Restrictions Precautions Precautions: Fall;Back Restrictions Weight Bearing Restrictions: No     Mobility  Bed Mobility Overal bed mobility: Needs Assistance Bed Mobility: Sit to Supine     Supine to sit: Max assist Sit to supine: Max assist   General bed mobility comments: Max assist to sit to supine via log roll technique. alot of vcs for technique and sequencining    Transfers Overall transfer level: Needs assistance Equipment used: Rolling walker (2 wheels) Transfers: Sit to/from Stand Sit to Stand: Mod assist;Max assist Stand pivot transfers: Max assist         General transfer comment: mod -max o stand but max to safely pivot to EOB    Ambulation/Gait   Gait Distance (Feet): 3 Feet           General Gait Details: did pick feet up during  pivot back to bed from recliner. Vcs for sequecning throughout. author supported under pt's BUEs      Balance Overall balance assessment: Needs assistance Sitting-balance support: Bilateral upper extremity supported;Feet supported Sitting balance-Leahy Scale: Fair Sitting balance - Comments: supervision of static sitting   Standing balance support: Bilateral upper extremity supported;During functional activity Standing balance-Leahy Scale: Poor Standing balance comment: BUE support and MOD A required to maintain static standing balance       Cognition Arousal/Alertness: Awake/alert Behavior During Therapy: WFL for tasks assessed/performed Overall Cognitive Status: Impaired/Different from baseline      General Comments: A and O  to self but confused about situation.           General Comments General comments (skin integrity, edema, etc.): Pt on 7L/min during session with SpO2 >92% throughout      Pertinent Vitals/Pain Pain Assessment: 0-10 Pain Score: 4  Faces Pain Scale: Hurts a little bit Pain Location: feet/ pain with bowl movements Pain Descriptors / Indicators: Aching;Discomfort;Grimacing Pain Intervention(s): Limited activity within patient's tolerance;Monitored during session;Premedicated before session;Repositioned     PT Goals (current goals can now be found in the care plan section) Acute Rehab PT Goals Patient Stated Goal: none stated Progress towards PT goals: Progressing toward goals    Frequency    Min 2X/week      PT Plan Current plan remains appropriate       AM-PAC PT "6 Clicks" Mobility   Outcome Measure  Help needed turning from your back to your side while in a flat  bed without using bedrails?: A Lot Help needed moving from lying on your back to sitting on the side of a flat bed without using bedrails?: A Lot Help needed moving to and from a bed to a chair (including a wheelchair)?: A Lot Help needed standing up from a chair using your  arms (e.g., wheelchair or bedside chair)?: Total Help needed to walk in hospital room?: Total Help needed climbing 3-5 steps with a railing? : Total 6 Click Score: 9    End of Session Equipment Utilized During Treatment: Oxygen Activity Tolerance: Patient tolerated treatment well Patient left: in bed;with call bell/phone within reach;with bed alarm set Nurse Communication: Mobility status PT Visit Diagnosis: Other abnormalities of gait and mobility (R26.89);Muscle weakness (generalized) (M62.81);Difficulty in walking, not elsewhere classified (R26.2);Unsteadiness on feet (R26.81)     Time: 2574-9355 PT Time Calculation (min) (ACUTE ONLY): 19 min  Charges:  $Therapeutic Activity: 8-22 mins                     Julaine Fusi PTA 08/21/21, 4:52 PM

## 2021-08-21 NOTE — Progress Notes (Signed)
Occupational Therapy Treatment Patient Details Name: Daniel Bolton MRN: [id number] DOB: 30-Sep-1934 Today's Date: 08/21/2021   History of present illness Pt is an 87 y/o M admitted on 08/11/21 after presenting to the ED with acute onset of generalized weakness with diminished appetite & oral intake. Pt with 1 fall, sliding down to the floor prior to admission. MRI results show several protruding disc in lumbar region. Pt was also found to have AKI & being treated for rhabdo with IVF. Pt started requiring HFNC & was transferred to higher level of care. PT has been re-consulted for re-evaluation. PMH: HTN, CAD s/p CABG, CHF, CKD, CVA   OT comments  Pt seen for OT treatment on this date. [employer] Interpreter utilized t/o conversation, however pt reporting difficulty hearing interpreter at times d/t being [facility]. Family present throughout. Pt currently requires MAX A for bed mobility (via log roll), MOD A for stand pivot transfers to recliner with RW, and SUPERVISION/SET-UP for seated grooming tasks d/t decreased strength and activity tolerance. While on 7L/min, SpO2 >92% throughout. Pt is making good progress toward goals and continues to benefit from skilled OT services to maximize return to PLOF and minimize risk of future falls, injury, caregiver burden, and readmission. Will continue to follow POC. Discharge recommendation remains appropriate.     Recommendations for follow up therapy are one component of a multi-disciplinary discharge planning process, led by the attending physician.  Recommendations may be updated based on patient status, additional functional criteria and insurance authorization.    Follow Up Recommendations  Skilled nursing-short term rehab (<3 hours/day)    Assistance Recommended at Discharge Intermittent Supervision/Assistance  Equipment Recommendations  Other (comment) (defer to next venue of care)       Precautions / Restrictions Precautions Precautions:  Fall;Back Restrictions Weight Bearing Restrictions: No       Mobility Bed Mobility Overal bed mobility: Needs Assistance Bed Mobility: Supine to Sit     Supine to sit: Max assist     General bed mobility comments: MAX A for supine>sit via log roll technique    Transfers Overall transfer level: Needs assistance Equipment used: Rolling walker (2 wheels);2 person hand held assist Transfers: Sit to/from Stand Sit to Stand: Mod assist Stand pivot transfers: Mod assist         General transfer comment: Pt able to initiate shuffled steps bed>recliner     Balance Overall balance assessment: Needs assistance Sitting-balance support: Bilateral upper extremity supported;Feet supported Sitting balance-Leahy Scale: Fair Sitting balance - Comments: SUPERVISION for static sitting balance at EOB with d/l UE support   Standing balance support: Bilateral upper extremity supported;During functional activity Standing balance-Leahy Scale: Poor Standing balance comment: BUE support and MOD A required to maintain static standing balance                           ADL either performed or assessed with clinical judgement   ADL Overall ADL's : Needs assistance/impaired     Grooming: Wash/dry hands;Wash/dry face;Oral care;Brushing hair;Supervision/safety;Set up;Sitting Grooming Details (indicate cue type and reason): Pt able to bring grooming items to face, however fatigues quickly. No physical assist required this date             Lower Body Dressing: Maximal assistance;Bed level Lower Body Dressing Details (indicate cue type and reason): to don/doff socks             Functional mobility during ADLs: Moderate assistance;Rolling walker (2 wheels) (stand pivot  transfer bed>recliner)      Extremity/Trunk Assessment Upper Extremity Assessment Upper Extremity Assessment: RUE deficits/detail;LUE deficits/detail RUE Deficits / Details: grossly 3-/5 in all movements LUE  Deficits / Details: grossly 3-/5 in all movements.   Lower Extremity Assessment Lower Extremity Assessment: Generalized weakness        Vision Baseline Vision/History: 1 Wears glasses Ability to See in Adequate Light: 0 Adequate Patient Visual Report: No change from baseline            Cognition Arousal/Alertness: Awake/alert Behavior During Therapy: WFL for tasks assessed/performed Overall Cognitive Status: Impaired/Different from baseline                                 General Comments: Pt alert and oriented to self, place, and year. Disoriented to date and situation. Pt presents with decreased awareness of deficits                General Comments Pt on 7L/min during session with SpO2 >92% throughout    Pertinent Vitals/ Pain       Pain Assessment: Faces Faces Pain Scale: Hurts a little bit Pain Location: buttocks Pain Descriptors / Indicators: Aching;Discomfort;Grimacing Pain Intervention(s): Limited activity within patient's tolerance;Monitored during session;Repositioned   Frequency  Min 2X/week        Progress Toward Goals  OT Goals(current goals can now be found in the care plan section)  Progress towards OT goals: Progressing toward goals  Acute Rehab OT Goals Patient Stated Goal: defer to next venue of care OT Goal Formulation: With patient/family Time For Goal Achievement: 09/02/21 Potential to Achieve Goals: [place] Discharge plan remains appropriate;Frequency remains appropriate       AM-PAC OT "6 Clicks" Daily Activity     Outcome Measure   Help from another person eating meals?: A Little Help from another person taking care of personal grooming?: A Little Help from another person toileting, which includes using toliet, bedpan, or urinal?: A Lot Help from another person bathing (including washing, rinsing, drying)?: A Lot Help from another person to put on and taking off regular upper body clothing?: A Little Help from  another person to put on and taking off regular lower body clothing?: A Lot 6 Click Score: 15    End of Session Equipment Utilized During Treatment: Rolling walker (2 wheels);Gait belt;Oxygen  OT Visit Diagnosis: Unsteadiness on feet (R26.81);Repeated falls (R29.6);Muscle weakness (generalized) (M62.81);History of falling (Z91.81)   Activity Tolerance Patient tolerated treatment well   Patient Left in chair;with call bell/phone within reach;with nursing/sitter in room;with family/visitor present   Nurse Communication Mobility status        Time: 7096-2836 OT Time Calculation (min): 28 min  Charges: OT General Charges $OT Visit: 1 Visit OT Evaluation $OT Eval Moderate Complexity: 1 Mod OT Treatments $Self Care/Home Management : 8-22 mins  Fredirick Maudlin, OTR/L [place]

## 2021-08-21 NOTE — Progress Notes (Signed)
[facility]  PROGRESS NOTE   Subjective:   History taken with assistance of Chief Operating Officer. Family in the room.   Holding all diuretics.   UOP 1093mL  Patient states he is starting to feel better. Still complains of pain with bowel movements.   Objective:  Vital signs in last 24 hours:  Temp:  [97.6 F (36.4 C)-98.1 F (36.7 C)] 97.6 F (36.4 C) (11/16 1554) Pulse Rate:  [62-72] 72 (11/16 1554) Resp:  [18] 18 (11/16 1554) BP: (122-149)/(52-64) 149/64 (11/16 1554) SpO2:  [100 %] 100 % (11/16 1554) Weight:  [62.8 kg] 62.8 kg (11/16 0328)  Weight change: -1.2 kg Filed Weights   08/19/21 0206 08/20/21 0631 08/21/21 0328  Weight: 64.8 kg 64 kg 62.8 kg    Intake/Output: I/O last 3 completed shifts: In: 32 [P.O.:460] Out: 1300 [Urine:1300]   Intake/Output this shift:  Total I/O In: 240 [P.O.:240] Out: 500 [Urine:500]  Physical Exam: General:  No acute distress, cachectic  Head:  Normocephalic, atraumatic. Moist oral mucosal membranes  Eyes:  Anicteric  Neck:  Supple  Lungs:   Clear to auscultation, normal effort  Heart:  regular  Abdomen:   Soft, nontender, bowel sounds present  Extremities:  +peripheral edema.  Neurologic:  Awake, alert, following commands  Skin:  No lesions        Basic Metabolic Panel: Recent Labs  Lab 08/16/21 0633 08/17/21 0638 08/18/21 0901 08/19/21 0457 08/20/21 0939 08/21/21 0424  NA 136 139 137 141  --  138  K 4.9 4.0 3.3* 3.8 3.0* 3.1*  2.9*  CL 105 105 105 107  --  104  CO2 22 20* 22 22  --  24  GLUCOSE 137* 93 152* 108*  --  117*  BUN 113* 118* 131* 142*  --  128*  CREATININE 3.28* 3.33* 3.10* 3.19* 3.49* 3.18*  3.03*  CALCIUM 8.0* 7.7* 7.6* 8.0*  --  8.1*  MG  --   --   --   --   --  2.2  PHOS  --   --   --   --   --  5.1*     CBC: Recent Labs  Lab 08/18/21 0901 08/20/21 1511  WBC 13.6* 9.7  HGB 8.2* 8.1*  HCT 25.2* 25.2*  MCV 84.8 84.3  PLT 234 202      Urinalysis: No results for  input(s): COLORURINE, LABSPEC, PHURINE, GLUCOSEU, HGBUR, BILIRUBINUR, KETONESUR, PROTEINUR, UROBILINOGEN, NITRITE, LEUKOCYTESUR in the last 72 hours.  Invalid input(s): APPERANCEUR    Imaging: No results found.   Medications:    ceFEPime (MAXIPIME) IV 2 g (08/21/21 1542)     feeding supplement  237 mL Oral TID BM   heparin injection (subcutaneous)  5,000 Units Subcutaneous Q8H   lidocaine  1 patch Transdermal Q24H   multivitamin with minerals  1 tablet Oral Daily   pantoprazole  40 mg Oral BID   polyethylene glycol  17 g Oral Daily   senna-docusate  1 tablet Oral BID    Assessment/ Plan:     Active Problems:   Moderate major depression, single episode (HCC)   Coronary artery disease   AKI (acute kidney injury) ([place])   Normochromic normocytic anemia   Essential hypertension   Hyponatremia   Acute on chronic combined systolic and diastolic CHF (congestive heart failure) (HCC)   HLD (hyperlipidemia)   Hyperkalemia   AAA (abdominal aortic aneurysm)   Elevated troponin   Malnutrition of moderate degree  Daniel Bolton [facility]  is a 87 y.o. Hispanic male with hypertension, coronary artery disease status post CABG, congestive heart failure, cerebrovascular accident, who is admitted to [facility] on 08/11/2021 for Shortness of breath [R06.02] Hyperkalemia [E87.5] Positive D dimer [R79.89] Elevated troponin [R77.8] Swelling of arm [M79.89] Generalized weakness [R53.1] AKI (acute kidney injury) ([place]) [N17.9]   Acute kidney injury on chronic kidney disease stage IV: baseline creatinine of 2.6, GFR of 24 on 07/31/2021. Followed by Dr. Lanora Manis. No acute indication for dialysis.Acute kidney injury secondary to rhabdomyolysis and acute cardiorenal syndrome. Now with concerns of overdiuresis - hold diuretics  - discussed dialysis with patient and his family. No indication for dialysis at this time.   Hypertension and acute exacerbation of systolic and diastolic congestive heart failure.   - Discontinued furosemide, acetazolamide and metolazone.   Anemia with chronic kidney disease: Hemoglobin 8.1. normocytic. Patient has not received ESA.   Rhabdomyolysis: holding statins.   Hypokalemia: - Potassium replacement.    LOS: [place], [facility] 11/16/20224:06 PM

## 2021-08-21 NOTE — Progress Notes (Signed)
[facility] Progress Note  Patient: Daniel Bolton    GUR:[id number]  DOA: 08/11/2021    Date of Service: the patient was seen and examined on 08/21/2021  Brief hospital course: hypertension, CAD status post CABG, CHF, chronic kidney disease and CVA, who presented to the emergency room with acute onset   generalized weakness since Friday with diminished appetite and oral intake.   He's been found to have AKI as well as generalized weakness.  Being treated for rhabdo with IVF (caution given hx HF).  Other potential causes for his weakness being evaluated as noted below. Due to elevated LFT had abd US...>HIDA>>>MRCP which was negative. Was empirically initially started on Zosyn for possible acute cholecystitis by surgery , but w/u negative so was discontinued . Now he is in CHF. May need HD.  Assessment and Plan: AKI on chronic kidney disease stage IV.   Baseline creatinine 2.6 on 07/31/2021. Suspect rhabdo (urine myoglobin positive) initially and now cardiorenal syndrome. 11/9 renal US suggesting med-renal dz. No hydron. Prostatomegaly. Renal ultrasound without hydronephrosis please see full report 11/15 was started on IV Lasix and metolazone due to acute systolic heart failure.  Now creatinine increasing. Nephrology following-no acute indication for dialysis at this time.   Lasix and metolazone were started, now discontinued Avoid nephrotoxic meds   Bilateral Lower Extremity Weakness  Generalized Weakness Granddaughter noted incontinence of bowel as well as numbness in legs as well Weakness could be from neurologic causes vs rhabdo/myositis. Neurosurgery recommending treatment for lumbar spinal stenosis once medically stable. 11/9 mri with cervical spinal canal stenosis and neural foraminal narrowing-see reports. 11/15 continue lidocaine patch  PT OT consulted recommend SNF Follow-up neurosurgery as outpatient  Hyperkalemia Now hypokalemia Initially treated with multiple  doses of Kayexalate. Now hypokalemic due to IV diuretics   Rhabdomyolysis UA Heme + and only 0-5 RBC's He's on amlodipine/simvastatin -> increases risk of rhabdo (sounds like he's been on this combination for a while -> regimen should be adjusted at discharge) - continue to hold  TSH elevated mildly, follow free T4 11/10-ck trended significantly higher, 18000. Tea colored urine CRP elevated 11/11 rheumatology was consulted for possible myositis.  Appreciate their input.  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 Will order this lab 11/15 CK trended down. Off IVF due to CHF and volume overload. Avoid statins.   Elevated LFT's 2/2 rhabdo  Negative acute hepatitis panel 11/9 abdominal ultrasound with cholelithiasis and findings suspicious for acute cholecystitis.  Dilated common bile duct suggestive of obstructing common bile duct stone.  MRI abdomen and MRCP can be helpful. Right pleural effusion 11/10 HIDA negative. GI/Surgery consulted MRCP with cholelithiasis without evidence of acute cholecystitis.  No evidence of cholelithiasis or biliary tract obstruction. LFTs trending down-likely also due to CHF and improving with Lasix 11/15 Lasix was discontinued.  Discussed this with surgery and GI no further input needed. Lasix now has been discontinued due to worsening creatinine. Continue to monitor.  Bilateral Pleural effusion Concern for pneumonia Found on MRCP Volume overloaded, and 2/2 chf 11/15 Lasix will be discontinued as above Obtained cxr revealing now small if any pleural fluid. Possible pneumonia. Afebrile.     Acute on Chronic combined systolic and diastolic HF EF 28-31%, global hypokinesis 04/2021, grade II diastolic dysfunction, moderately reduced RVSF  11/15 appears to be less volume overloaded Discontinuing IV Lasix and metolazone due to worsening  creatinine Continue I's and O's  Daily weight  Lumbar Stenosis with Radiculopathy Lumbar imaging obtained with concern of LE weakness + incontinence of stool C/T spine imaging obtained with concern for UE pain, weakness as well - follow with neurology NSGY rec considering decompression when medically stable Preoperative clearance per cardiology - high risk surgical candidate (see 11/8 note)  Elevated D dimer  RUE Swelling No evidence of DVT in RUE VQ scan pending -> indeterminate LE [place] - negative for DVT less suspicious for PE based on his overall picture   CAD with hx CABG  Elevated Troponin Aspirin, plavix still being held in setting of GI bleed (was supposed to hold until reeval)  No CP, appreciate cardiology assistance - suspect demand ischemia Appreciate cardiology assistance   Sinus Bradycardia Currently metoprolol on hold HR as low as 40's documented outpatient  Appreciate cardiology recs (see 11/8 note, signing off)  Hypertension Holding BP meds due to BP being low.monitor if resume if indicated.    HLD Holding simvastatin given elevated CK and on amlodipine, if resuming statin at discharge should resume alternative statin or simvastatin at lower dose (after CK, LFT's improved)   GERD PPI BID  Moderate malnutrition. Dietitian consulted.  Monitor. Body mass index is 26.16 kg/m.  Nutrition Problem: Moderate Malnutrition Etiology: chronic illness (CHF)  Subjective: Per daughter pain present.  Unable to sleep at night secondary to pain.  No nausea no vomiting.  No fever no chills.  Pain is primarily located in the bottom and lower back.  Physical Exam: General: Appear in mild distress, no Rash; Oral Mucosa Clear, moist. no Abnormal Neck Mass Or lumps, Conjunctiva normal  Cardiovascular: S1 and S2 Present, no Murmur, Respiratory: good respiratory effort, Bilateral Air entry present and CTA, no Crackles, no wheezes Abdomen: Bowel Sound present, Soft and no  tenderness Extremities: no Pedal edema Neurology: alert and oriented to time, place, and person affect appropriate. no new focal deficit Gait not checked due to patient safety concerns  Data Reviewed: My review of labs, imaging, notes and other tests is significant for     hypokalemia, improving serum creatinine.  Improving BUN still elevated.  Disposition:  Status is: Inpatient  Remains inpatient appropriate because: Remains confused.  Worsening encephalopathy.  Will need HD.  Family Communication: family was present at bedside, at the time of interview.   DVT Prophylaxis: heparin injection 5,000 Units Start: 08/16/21 0600   Time spent: 35 minutes. I reviewed all nursing notes, pharmacy notes, vitals, pertinent old records. I have discussed plan of care as described above with RN.  Author: Berle Mull  08/21/2021 8:21 PM  To reach On-call, see care teams to locate the attending and reach out via [email or web address]. Between 7PM-7AM, please contact night-coverage If you still have difficulty reaching the attending provider, please page the [facility] (Director on Call) for [facility] on amion for assistance.

## 2021-08-22 DIAGNOSIS — N179 Acute kidney failure, unspecified: Secondary | ICD-10-CM | POA: Diagnosis not present

## 2021-08-22 LAB — CBC WITH DIFFERENTIAL/PLATELET
Abs Immature Granulocytes: 0.02 10*3/uL (ref 0.00–0.07)
Basophils Absolute: 0 10*3/uL (ref 0.0–0.1)
Basophils Relative: 0 %
Eosinophils Absolute: 0.2 10*3/uL (ref 0.0–0.5)
Eosinophils Relative: 3 %
HCT: 26.8 % — ABNORMAL LOW (ref 39.0–52.0)
Hemoglobin: 8.7 g/dL — ABNORMAL LOW (ref 13.0–17.0)
Immature Granulocytes: 0 %
Lymphocytes Relative: 8 %
Lymphs Abs: 0.7 10*3/uL (ref 0.7–4.0)
MCH: 27.9 pg (ref 26.0–34.0)
MCHC: 32.5 g/dL (ref 30.0–36.0)
MCV: 85.9 fL (ref 80.0–100.0)
Monocytes Absolute: 0.3 10*3/uL (ref 0.1–1.0)
Monocytes Relative: 3 %
Neutro Abs: 7.2 10*3/uL (ref 1.7–7.7)
Neutrophils Relative %: 86 %
Platelets: 232 10*3/uL (ref 150–400)
RBC: 3.12 MIL/uL — ABNORMAL LOW (ref 4.22–5.81)
RDW: 16.4 % — ABNORMAL HIGH (ref 11.5–15.5)
WBC: 8.4 10*3/uL (ref 4.0–10.5)
nRBC: 0 % (ref 0.0–0.2)

## 2021-08-22 LAB — COMPREHENSIVE METABOLIC PANEL
ALT: 164 U/L — ABNORMAL HIGH (ref 0–44)
AST: 68 U/L — ABNORMAL HIGH (ref 15–41)
Albumin: 2.1 g/dL — ABNORMAL LOW (ref 3.5–5.0)
Alkaline Phosphatase: 193 U/L — ABNORMAL HIGH (ref 38–126)
Anion gap: 13 (ref 5–15)
BUN: 89 mg/dL — ABNORMAL HIGH (ref 8–23)
CO2: 25 mmol/L (ref 22–32)
Calcium: 8.1 mg/dL — ABNORMAL LOW (ref 8.9–10.3)
Chloride: 104 mmol/L (ref 98–111)
Creatinine, Ser: 2.02 mg/dL — ABNORMAL HIGH (ref 0.61–1.24)
GFR, Estimated: 32 mL/min — ABNORMAL LOW (ref >60)
Glucose, Bld: 107 mg/dL — ABNORMAL HIGH (ref 70–99)
Potassium: 3.5 mmol/L (ref 3.5–5.1)
Sodium: 142 mmol/L (ref 135–145)
Total Bilirubin: 1.2 mg/dL (ref 0.3–1.2)
Total Protein: 5.9 g/dL — ABNORMAL LOW (ref 6.5–8.1)

## 2021-08-22 LAB — MAGNESIUM: Magnesium: 2 mg/dL (ref 1.7–2.4)

## 2021-08-22 MED ORDER — HYDROCORTISONE (PERIANAL) 2.5 % EX CREA
TOPICAL_CREAM | Freq: Three times a day (TID) | CUTANEOUS | Status: DC
  Administered 2021-08-22 – 2021-08-24 (×2): 1 via RECTAL
  Filled 2021-08-22 (×2): qty 28.35

## 2021-08-22 NOTE — Progress Notes (Signed)
Pt and daughter at bedside offered interpreter but refused at this time. Will continue to monitor.

## 2021-08-22 NOTE — Progress Notes (Signed)
[facility]  PROGRESS NOTE   Subjective:   History taken with assistance of Chief Operating Officer. Family in the room.   Holding all diuretics.   UOP 1144mL  Creatinine 2.02 (3.18)  Patient lethargic this morning.   Objective:  Vital signs in last 24 hours:  Temp:  [97.6 F (36.4 C)-99 F (37.2 C)] 97.9 F (36.6 C) (11/17 1141) Pulse Rate:  [67-77] 72 (11/17 1141) Resp:  [18-20] 18 (11/17 1141) BP: (139-163)/(36-79) 139/46 (11/17 1141) SpO2:  [99 %-100 %] 99 % (11/17 1141) Weight:  [62.2 kg] 62.2 kg (11/17 0315)  Weight change: -0.6 kg Filed Weights   08/20/21 0631 08/21/21 0328 08/22/21 0315  Weight: 64 kg 62.8 kg 62.2 kg    Intake/Output: I/O last 3 completed shifts: In: 12 [P.O.:720] Out: 1700 [Urine:1700]   Intake/Output this shift:  Total I/O In: 240 [P.O.:240] Out: 600 [Urine:600]  Physical Exam: General:  No acute distress, cachectic  Head:  Normocephalic, atraumatic. Moist oral mucosal membranes  Eyes:  Anicteric  Neck:  Supple  Lungs:   Clear to auscultation, normal effort  Heart:  regular  Abdomen:   Soft, nontender, bowel sounds present  Extremities:  Trace peripheral edema.  Neurologic:  lethargic  Skin:  No lesions        Basic Metabolic Panel: Recent Labs  Lab 08/17/21 0638 08/18/21 0901 08/19/21 0457 08/20/21 0939 08/21/21 0424 08/22/21 0700  NA 139 137 141  --  138 142  K 4.0 3.3* 3.8 3.0* 3.1*  2.9* 3.5  CL 105 105 107  --  104 104  CO2 20* 22 22  --  24 25  GLUCOSE 93 152* 108*  --  117* 107*  BUN 118* 131* 142*  --  128* 89*  CREATININE 3.33* 3.10* 3.19* 3.49* 3.18*  3.03* 2.02*  CALCIUM 7.7* 7.6* 8.0*  --  8.1* 8.1*  MG  --   --   --   --  2.2 2.0  PHOS  --   --   --   --  5.1*  --      CBC: Recent Labs  Lab 08/18/21 0901 08/20/21 1511 08/22/21 0700  WBC 13.6* 9.7 8.4  NEUTROABS  --   --  7.2  HGB 8.2* 8.1* 8.7*  HCT 25.2* 25.2* 26.8*  MCV 84.8 84.3 85.9  PLT 234 202 232       Urinalysis: No results for input(s): COLORURINE, LABSPEC, PHURINE, GLUCOSEU, HGBUR, BILIRUBINUR, KETONESUR, PROTEINUR, UROBILINOGEN, NITRITE, LEUKOCYTESUR in the last 72 hours.  Invalid input(s): APPERANCEUR    Imaging: No results found.   Medications:       feeding supplement  237 mL Oral TID BM   heparin injection (subcutaneous)  5,000 Units Subcutaneous Q8H   lidocaine  1 patch Transdermal Q24H   multivitamin with minerals  1 tablet Oral Daily   pantoprazole  40 mg Oral BID   polyethylene glycol  17 g Oral Daily   senna-docusate  1 tablet Oral BID    Assessment/ Plan:     Active Problems:   Moderate major depression, single episode (HCC)   Coronary artery disease   AKI (acute kidney injury) ([place])   Normochromic normocytic anemia   Essential hypertension   Hyponatremia   Acute on chronic combined systolic and diastolic CHF (congestive heart failure) (HCC)   HLD (hyperlipidemia)   Hyperkalemia   AAA (abdominal aortic aneurysm)   Elevated troponin   Malnutrition of moderate degree  Daniel Bolton is a  87 y.o. Hispanic male with hypertension, coronary artery disease status post CABG, congestive heart failure, cerebrovascular accident, who is admitted to [facility] on 08/11/2021 for Shortness of breath [R06.02] Hyperkalemia [E87.5] Positive D dimer [R79.89] Elevated troponin [R77.8] Swelling of arm [M79.89] Generalized weakness [R53.1] AKI (acute kidney injury) ([place]) [N17.9]   Acute kidney injury on chronic kidney disease stage IV: baseline creatinine of 2.6, GFR of 24 on 07/31/2021. Followed by Dr. Lanora Manis. No acute indication for dialysis.Acute kidney injury secondary to rhabdomyolysis and acute cardiorenal syndrome. Now with concerns of overdiuresis - hold diuretics  - discussed dialysis with patient and his family. No indication for dialysis at this time.   Hypertension and acute exacerbation of systolic and diastolic congestive heart failure.  -  Discontinued furosemide, acetazolamide and metolazone.   Anemia with chronic kidney disease: Hemoglobin 8.7. normocytic. Patient has not received ESA.   Rhabdomyolysis: holding statins.   Hypokalemia: with hyperkalemia on admission.  - Potassium replacement PO yesterday.     LOS: [place] 11/17/202212:49 PM

## 2021-08-22 NOTE — Progress Notes (Signed)
[facility] Progress Note  Patient: Daniel Bolton    OTL:[id number]  DOA: 08/11/2021    Date of Service: the patient was seen and examined on 08/22/2021  Brief hospital course: hypertension, CAD status post CABG, CHF, chronic kidney disease and CVA, who presented to the emergency room with acute onset   generalized weakness since Friday with diminished appetite and oral intake.   He's been found to have AKI as well as generalized weakness.  Being treated for rhabdo with IVF (caution given hx HF).  Other potential causes for his weakness being evaluated as noted below. Due to elevated LFT had abd US...>HIDA>>>MRCP which was negative. Was empirically initially started on Zosyn for possible acute cholecystitis by surgery , but w/u negative so was discontinued . Now he is in CHF. May need HD.  Assessment and Plan: AKI on chronic kidney disease stage IV. Uremia Baseline creatinine 2.6 on 07/31/2021. Suspect rhabdo (urine myoglobin positive) initially and now cardiorenal syndrome. 11/9 renal US suggesting med-renal dz. No hydron. Prostatomegaly. Renal ultrasound without hydronephrosis please see full report 11/15 was started on IV Lasix and metolazone due to acute systolic heart failure.  Creatinine trending up. After holding diuresis creatinine is now partly trending down with significant improvement in BUN as well. Appreciate nephrology assistance.  Acute toxic and metabolic encephalopathy. Appears to be encephalopathic from gabapentin, cefepime as well as uremia. Pain medication as well as lack of sleep can also contribute to encephalopathy. Patient is positive for asterixis. BUN trending improving.  The cefepime was discontinued. Unable to minimize pain medication due to ongoing pain. Monitor ordered. This renders very poor prognosis due to ongoing dysphagia as well as pain requirement. Consulting palliative care for further assistance and goals   Bilateral Lower Extremity  Weakness  Generalized Weakness Granddaughter noted incontinence of bowel as well as numbness in legs as well Weakness could be from neurologic causes vs rhabdo/myositis. Neurosurgery recommending treatment for lumbar spinal stenosis once medically stable. 11/9 mri with cervical spinal canal stenosis and neural foraminal narrowing-see reports. 11/15 continue lidocaine patch  PT OT consulted recommend SNF Follow-up neurosurgery as outpatient  Hyperkalemia Now hypokalemia Initially treated with multiple doses of Kayexalate. Now hypokalemic due to IV diuretics and poor p.o. intake   Rhabdomyolysis UA Heme + and only 0-5 RBC's He's on amlodipine/simvastatin -> increases risk of rhabdo (sounds like he's been on this combination for a while -> regimen should be adjusted at discharge) - continue to hold  TSH elevated mildly, follow free T4 11/10-ck trended significantly higher, 18000. Tea colored urine CRP elevated 11/11 rheumatology was consulted for possible myositis.  Appreciate their input.  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 Will order this lab 11/15 CK trended down. Off IVF due to CHF and volume overload. Avoid statins.   Elevated LFT's 2/2 rhabdo  Negative acute hepatitis panel 11/9 abdominal ultrasound with cholelithiasis and findings suspicious for acute cholecystitis.  Dilated common bile duct suggestive of obstructing common bile duct stone.  MRI abdomen and MRCP can be helpful. Right pleural effusion 11/10 HIDA negative. GI/Surgery consulted MRCP with cholelithiasis without evidence of acute cholecystitis.  No evidence of cholelithiasis or biliary tract obstruction. LFTs trending down-likely also due to CHF and improving with Lasix 11/15 Lasix was discontinued.  Discussed this with surgery and GI no further input needed. Lasix now has been discontinued due to  worsening creatinine. Continue to monitor.  Bilateral  Pleural effusion Concern for pneumonia Found on MRCP Volume overloaded, and 2/2 chf 11/15 Lasix will be discontinued as above Obtained cxr revealing now small if any pleural fluid. Possible pneumonia. Afebrile.     Acute on Chronic combined systolic and diastolic HF EF 76-72%, global hypokinesis 04/2021, grade II diastolic dysfunction, moderately reduced RVSF  11/15 appears to be less volume overloaded Discontinuing IV Lasix and metolazone due to worsening creatinine Continue I's and O's Daily weight  Lumbar Stenosis with Radiculopathy Lumbar imaging obtained with concern of LE weakness + incontinence of stool C/T spine imaging obtained with concern for UE pain, weakness as well - follow with neurology NSGY rec considering decompression when medically stable Preoperative clearance per cardiology - high risk surgical candidate (see 11/8 note)  Elevated D dimer  RUE Swelling No evidence of DVT in RUE VQ scan pending -> indeterminate LE [place] - negative for DVT less suspicious for PE based on his overall picture   CAD with hx CABG  Elevated Troponin Aspirin, plavix still being held in setting of GI bleed (was supposed to hold until reeval)  No CP, appreciate cardiology assistance - suspect demand ischemia Appreciate cardiology assistance   Sinus Bradycardia Currently metoprolol on hold HR as low as 40's documented outpatient  Appreciate cardiology recs (see 11/8 note, signing off)  Hypertension Holding BP meds due to BP being low.monitor if resume if indicated.    HLD Holding simvastatin given elevated CK and on amlodipine, if resuming statin at discharge should resume alternative statin or simvastatin at lower dose (after CK, LFT's improved)   GERD PPI BID  Dysphagia  appreciate speech therapy. Currently on dysphagia 1 diet. Remains at high risk for aspiration.  Even with that.  Moderate malnutrition. Dietitian  consulted.  Monitor. Body mass index is 25.91 kg/m.  Nutrition Problem: Moderate Malnutrition Etiology: chronic illness (CHF)  Subjective: Son at bedside. Reports patient is hallucinating.  Patient continues to report severe pain in the back.  Minimal oral intake reported. Patient unable to work with interpreter.  Son helps with interpretation  Physical Exam: General: Appear in mild distress, no Rash; Oral Mucosa Clear, moist. no Abnormal Neck Mass Or lumps, Conjunctiva normal  Cardiovascular: S1 and S2 Present, no Murmur, Respiratory: good respiratory effort, Bilateral Air entry present and CTA, no Crackles, no wheezes Abdomen: Bowel Sound present, Soft and no tenderness Extremities: no Pedal edema Neurology: alert and oriented to place, and person affect appropriate. no new focal deficit, positive for asterixis. Gait not checked due to patient safety concerns   Data Reviewed: My review of labs, imaging, notes and other tests is significant for     potassium improving.  Serum creatinine trending from 3.18-2.02.  BUN improving from 1 28-89.  LFTs improving.  Bilirubin improving as well.  Hemoglobin stable.  Disposition:  Status is: Inpatient  Remains inpatient appropriate because: [place]ation not back to baseline.  In fact worsening.  Ongoing pain control issues.  Family Communication: family was present at bedside, at the time of interview.   DVT Prophylaxis: heparin injection 5,000 Units Start: 08/16/21 0600   Time spent: 35 minutes. I reviewed all nursing notes, pharmacy notes, vitals, pertinent old records. I have discussed plan of care as described above with RN.  Author: Berle Mull  08/22/2021 8:55 PM  To reach On-call, see care teams to locate the attending and reach out via [email or web address]. Between 7PM-7AM, please contact night-coverage If you still have difficulty reaching the attending provider, please page the [facility] (Director  on Call) for [facility] on amion  for assistance.

## 2021-08-22 NOTE — Progress Notes (Signed)
SLP Cancellation Note  Patient Details Name: Daniel Bolton MRN: [id number] DOB: 27-Dec-1933   Cancelled treatment:       Reason Eval/Treat Not Completed:  (chart reviewed; consulted NSG, MD re: pt's status).  Per NSG and observation, pt continues to take minimal oral intake: only bites and sips of po's despite encouragement and feeding support by Staff and Family in room. Pt is often lethargic w/ overall weakness requiring Mod-Max support w/ any task. Pt appears to benefit from the current Dysphagia diet (puree w/ thin liquids) at this time for conservation of energy d/t his presentation. Pt does remain at risk for dysphagia, aspiration d/t lengthy illness and debilitation. Due to pt making no progress w/ his oral intake, and his general decline in status overall necessitating a Palliative Care consult w/ Family for [place], [place] services will sign off at this time. MD to reconsult ST services when pt is appropriate to consider a diet consistency upgrade. Recommend continue current aspiration precautions w/ Pills given Crushed in Puree for safer swallowing; oral care.  NSG updated, agreed.      Orinda Kenner, MS, CCC-SLP Speech Language Pathologist Rehab Services [phone] [facility] 08/22/2021, 3:05 PM

## 2021-08-22 NOTE — Plan of Care (Signed)
?  Problem: Health Behavior/Discharge Planning: ?Goal: Ability to manage health-related needs will improve ?Outcome: Progressing ?  ?Problem: Clinical Measurements: ?Goal: Will remain free from infection ?Outcome: Progressing ?  ?Problem: Clinical Measurements: ?Goal: Respiratory complications will improve ?Outcome: Progressing ?  ?Problem: Clinical Measurements: ?Goal: Cardiovascular complication will be avoided ?Outcome: Progressing ?  ?Problem: Pain Managment: ?Goal: General experience of comfort will improve ?Outcome: Progressing ?  ?Problem: Safety: ?Goal: Ability to remain free from injury will improve ?Outcome: Progressing ?  ?

## 2021-08-23 ENCOUNTER — Inpatient Hospital Stay: Payer: Medicare HMO

## 2021-08-23 DIAGNOSIS — R7989 Other specified abnormal findings of blood chemistry: Secondary | ICD-10-CM

## 2021-08-23 DIAGNOSIS — M5416 Radiculopathy, lumbar region: Secondary | ICD-10-CM | POA: Diagnosis present

## 2021-08-23 DIAGNOSIS — J90 Pleural effusion, not elsewhere classified: Secondary | ICD-10-CM | POA: Diagnosis present

## 2021-08-23 DIAGNOSIS — M48061 Spinal stenosis, lumbar region without neurogenic claudication: Secondary | ICD-10-CM | POA: Diagnosis present

## 2021-08-23 DIAGNOSIS — R0602 Shortness of breath: Secondary | ICD-10-CM | POA: Diagnosis not present

## 2021-08-23 DIAGNOSIS — R1310 Dysphagia, unspecified: Secondary | ICD-10-CM

## 2021-08-23 DIAGNOSIS — Z515 Encounter for palliative care: Secondary | ICD-10-CM

## 2021-08-23 DIAGNOSIS — Z789 Other specified health status: Secondary | ICD-10-CM

## 2021-08-23 DIAGNOSIS — I5043 Acute on chronic combined systolic (congestive) and diastolic (congestive) heart failure: Secondary | ICD-10-CM | POA: Diagnosis not present

## 2021-08-23 DIAGNOSIS — R29898 Other symptoms and signs involving the musculoskeletal system: Secondary | ICD-10-CM | POA: Insufficient documentation

## 2021-08-23 DIAGNOSIS — R531 Weakness: Secondary | ICD-10-CM | POA: Diagnosis not present

## 2021-08-23 DIAGNOSIS — N19 Unspecified kidney failure: Secondary | ICD-10-CM | POA: Diagnosis present

## 2021-08-23 DIAGNOSIS — J690 Pneumonitis due to inhalation of food and vomit: Secondary | ICD-10-CM | POA: Diagnosis present

## 2021-08-23 DIAGNOSIS — M6282 Rhabdomyolysis: Secondary | ICD-10-CM | POA: Diagnosis present

## 2021-08-23 DIAGNOSIS — E440 Moderate protein-calorie malnutrition: Secondary | ICD-10-CM

## 2021-08-23 DIAGNOSIS — B370 Candidal stomatitis: Secondary | ICD-10-CM

## 2021-08-23 DIAGNOSIS — Z7189 Other specified counseling: Secondary | ICD-10-CM

## 2021-08-23 DIAGNOSIS — N179 Acute kidney failure, unspecified: Secondary | ICD-10-CM | POA: Diagnosis not present

## 2021-08-23 DIAGNOSIS — R7401 Elevation of levels of liver transaminase levels: Secondary | ICD-10-CM | POA: Diagnosis present

## 2021-08-23 DIAGNOSIS — I714 Abdominal aortic aneurysm, without rupture, unspecified: Secondary | ICD-10-CM

## 2021-08-23 DIAGNOSIS — G9341 Metabolic encephalopathy: Secondary | ICD-10-CM | POA: Diagnosis present

## 2021-08-23 LAB — CBC WITH DIFFERENTIAL/PLATELET
Abs Immature Granulocytes: 0.04 10*3/uL (ref 0.00–0.07)
Basophils Absolute: 0 10*3/uL (ref 0.0–0.1)
Basophils Relative: 0 %
Eosinophils Absolute: 0.1 10*3/uL (ref 0.0–0.5)
Eosinophils Relative: 1 %
HCT: 26.1 % — ABNORMAL LOW (ref 39.0–52.0)
Hemoglobin: 8.4 g/dL — ABNORMAL LOW (ref 13.0–17.0)
Immature Granulocytes: 1 %
Lymphocytes Relative: 8 %
Lymphs Abs: 0.6 10*3/uL — ABNORMAL LOW (ref 0.7–4.0)
MCH: 28 pg (ref 26.0–34.0)
MCHC: 32.2 g/dL (ref 30.0–36.0)
MCV: 87 fL (ref 80.0–100.0)
Monocytes Absolute: 0.3 10*3/uL (ref 0.1–1.0)
Monocytes Relative: 4 %
Neutro Abs: 7.5 10*3/uL (ref 1.7–7.7)
Neutrophils Relative %: 86 %
Platelets: 249 10*3/uL (ref 150–400)
RBC: 3 MIL/uL — ABNORMAL LOW (ref 4.22–5.81)
RDW: 16.7 % — ABNORMAL HIGH (ref 11.5–15.5)
WBC: 8.6 10*3/uL (ref 4.0–10.5)
nRBC: 0 % (ref 0.0–0.2)

## 2021-08-23 LAB — COMPREHENSIVE METABOLIC PANEL
ALT: 143 U/L — ABNORMAL HIGH (ref 0–44)
AST: 62 U/L — ABNORMAL HIGH (ref 15–41)
Albumin: 2.1 g/dL — ABNORMAL LOW (ref 3.5–5.0)
Alkaline Phosphatase: 201 U/L — ABNORMAL HIGH (ref 38–126)
Anion gap: 8 (ref 5–15)
BUN: 93 mg/dL — ABNORMAL HIGH (ref 8–23)
CO2: 27 mmol/L (ref 22–32)
Calcium: 8.5 mg/dL — ABNORMAL LOW (ref 8.9–10.3)
Chloride: 108 mmol/L (ref 98–111)
Creatinine, Ser: 1.69 mg/dL — ABNORMAL HIGH (ref 0.61–1.24)
GFR, Estimated: 39 mL/min — ABNORMAL LOW (ref >60)
Glucose, Bld: 117 mg/dL — ABNORMAL HIGH (ref 70–99)
Potassium: 3.4 mmol/L — ABNORMAL LOW (ref 3.5–5.1)
Sodium: 143 mmol/L (ref 135–145)
Total Bilirubin: 1.2 mg/dL (ref 0.3–1.2)
Total Protein: 6.1 g/dL — ABNORMAL LOW (ref 6.5–8.1)

## 2021-08-23 LAB — MAGNESIUM: Magnesium: 2.1 mg/dL (ref 1.7–2.4)

## 2021-08-23 MED ORDER — OXYCODONE HCL 5 MG PO TABS: 5.0000 mg | ORAL_TABLET | Freq: Four times a day (QID) | ORAL | Status: DC | PRN

## 2021-08-23 MED ORDER — NYSTATIN 100000 UNIT/ML MT SUSP
5.0000 mL | Freq: Four times a day (QID) | OROMUCOSAL | Status: DC
  Administered 2021-08-23 – 2021-08-25 (×10): 500000 [IU] via ORAL
  Filled 2021-08-23 (×8): qty 5

## 2021-08-23 MED ORDER — SPIRONOLACTONE 25 MG PO TABS
25.0000 mg | ORAL_TABLET | Freq: Every day | ORAL | Status: DC
Start: 2021-08-23 — End: 2021-08-24
  Administered 2021-08-23 – 2021-08-24 (×2): 25 mg via ORAL
  Filled 2021-08-23 (×2): qty 1

## 2021-08-23 MED ORDER — AMLODIPINE BESYLATE 5 MG PO TABS: 5.0000 mg | ORAL_TABLET | Freq: Every day | ORAL | Status: DC

## 2021-08-23 MED ORDER — FUROSEMIDE 40 MG PO TABS
40.0000 mg | ORAL_TABLET | Freq: Every day | ORAL | Status: DC
  Administered 2021-08-23 – 2021-08-24 (×2): 40 mg via ORAL
  Filled 2021-08-23 (×2): qty 1

## 2021-08-23 MED ORDER — ALBUTEROL SULFATE (2.5 MG/3ML) 0.083% IN NEBU: 2.5000 mg | INHALATION_SOLUTION | RESPIRATORY_TRACT | Status: DC | PRN

## 2021-08-23 NOTE — Assessment & Plan Note (Addendum)
Comfort care now °

## 2021-08-23 NOTE — Progress Notes (Signed)
[facility] Progress Note  Patient: Daniel Bolton    KGY:[id number]  DOA: 08/11/2021    Date of Service: the patient was seen and examined on 08/23/2021  Brief hospital course: 11/6 admitted to the hospital. 11/7 cardiology consulted for elevated troponins.  Neurosurgery consulted for lumbar spinal stenosis.  Recommend medical stability before considering laminectomy.  Nephrology was also consulted. Treated with IV hydration. 11/9 GI and general surgery consulted for concern for worsening LFT and possible cholecystitis.  Patient was on antibiotic.  Cholecystitis ruled out. 11/11 rheumatology consulted.  Recommend no statin in future.  No need for muscle biopsy. 11/12 started on diuresis due to volume overload. 11/15 diuresis was on hold due to worsening renal function and BUN and uremia. 11/18 started on Lasix and Aldactone chest x-ray shows persistent edema/pneumonia.   Assessment and Plan:  * Acute renal failure superimposed on stage 4 chronic kidney disease ([place]) Secondary to rhabdomyolysis. Treated with IV hydration.   patient becomes volume overloaded.  IV hydration was on hold.  And patient was started on IV diuresis. Renal function worsens further with uremia and diuresis was on hold. Given that the patient is showing improvement in his renal function oral diuresis is now initiated.  Acute metabolic encephalopathy Appears to be encephalopathic from gabapentin, cefepime as well as uremia. Pain medication as well as lack of sleep can also contribute to encephalopathy. Patient is positive for asterixis. BUN trending improving.  The cefepime was discontinued. Unable to minimize pain medication due to ongoing pain. Monitor ordered. This renders very poor prognosis due to ongoing dysphagia as well as pain requirement. Consulting palliative care for further assistance and goals  Uremia Currently improving.  Mentation still not improving.   Monitor.  Transaminitis Secondary to rhabdomyolysis. AST was 1000 ALT was 500 on admission. Currently AST 62 and ALT 143.  Bilirubin is also improving.  Monitor.   Non-traumatic rhabdomyolysis Treated with IV diuresis.  CK peaked at 18,000 on 11/10.  Currently improving. No statin.  Rheumatology was also consulted.  Appreciate assistance.   Spinal stenosis of lumbar region with radiculopathy Neurosurgery was consulted.  Due to generalized weakness.  Recommended laminectomy outpatient once medical issues are resolved.  Pain control remains an issue for the patient.  Bilateral pleural effusion Continue diuresis.  Unable to follow on incentive spirometry.  Acute on chronic combined systolic and diastolic CHF (congestive heart failure) ([place]) Acute worsening secondary to IV hydration required for rhabdomyolysis. Will resume Lasix Aldactone for diuresis and monitor.  Chest x-ray still shows vascular congestion.   Dysphagia Due to mentation and uremia and encephalopathy as well as generalized weakness. Currently on dysphagia 1 diet thin liquid.  Speech therapy following.  Positive D dimer Upper extremity DVT on right negative. VQ scan indeterminate as well. Lower extremity Doppler negative for DVT as well. Most likely this is secondary to renal dysfunction.  Aspiration pneumonia of both lower lobes due to gastric secretions ([place]) Treated with cefepime and Zosyn. Currently remains with dysphagia and encephalopathy. Chest x-ray shows possible consolidation as well. Monitor.  Elevated troponin Likely secondary to rhabdomyolysis. No cardiac issues. Cardiology was consulted no further work-up recommended.   Hyper and hypokalemia. Potassium level was elevated in the setting of worsening renal function. Now with diuresis potassium level is actually rather on the lower side. Aldactone initiated.  HLD (hyperlipidemia) No statin.  Oral thrush Treated with  nystatin.  Hyponatremia From poor p.o. intake.  Monitor.  Essential hypertension Blood pressure elevated. Started on Lasix  and Aldactone.  Monitor.  Normochromic normocytic anemia Hemoglobin remained stable.  Monitor.  Counseling regarding goals of care Appreciate palliative care assistance. Patient has poor prognosis in the setting of multiple comorbidities as well as ongoing encephalopathy. With dysphagia remains at risk for recurrent aspiration pneumonia. Prognosis is poor in the long-term. Family wants to continue full code for now.  Malnutrition of moderate degree Placing the patient at risk for poor outcome.  Continue supplements.  CAD (coronary artery disease), SP CABG Was on aspirin and Plavix.  Held in the setting of GI bleed. Currently recommend close monitoring.  AAA (abdominal aortic aneurysm) Incidental.  Outpatient follow-up.   Body mass index is 25.91 kg/m.  Nutrition Problem: Moderate Malnutrition Etiology: chronic illness (CHF) Pressure Injury 08/21/21 Heel Left Deep Tissue Pressure Injury - Purple or maroon localized area of discolored intact skin or blood-filled blister due to damage of underlying soft tissue from pressure and/or shear. purple color (Active)  08/21/21 2325  Location: Heel  Location Orientation: Left  Staging: Deep Tissue Pressure Injury - Purple or maroon localized area of discolored intact skin or blood-filled blister due to damage of underlying soft tissue from pressure and/or shear.  Wound Description (Comments): purple color  Present on Admission:      Pressure Injury 08/21/21 Heel Right (Active)  08/21/21 2325  Location: Heel  Location Orientation: Right  Staging:   Wound Description (Comments):   Present on Admission:      Subjective: Interpreter was used.  Reports pain improving.  No nausea no vomiting.  No fever no chills.  Continues to have shortness of breath.  Also report sore throat.  Physical Exam: General: Appear  in mild distress, no Rash; Oral Mucosa shows thrush. no Abnormal Neck Mass Or lumps, Conjunctiva normal  Cardiovascular: S1 and S2 Present, no Murmur, Respiratory: increased respiratory effort, Bilateral Air entry present and bilateral Crackles, Occasional  wheezes Abdomen: Bowel Sound present, Soft and no tenderness Extremities: no Pedal edema Neurology: alert and oriented to place and person affect flat in affect. no new focal deficit, asterixis present Gait not checked due to patient safety concerns  Data Reviewed: Improvement in renal function.  Disposition:  Status is: Inpatient  Remains inpatient appropriate because: Ongoing encephalopathy as well as renal dysfunction requiring treatment.    Family Communication: family was present at bedside, at the time of interview.   DVT Prophylaxis: heparin injection 5,000 Units Start: 08/16/21 0600   Time spent: 35 minutes. I reviewed all nursing notes, pharmacy notes, vitals, pertinent old records. I have discussed plan of care as described above with RN.  Author: Berle Mull  08/23/2021 7:05 PM  To reach On-call, see care teams to locate the attending and reach out via [email or web address]. Between 7PM-7AM, please contact night-coverage If you still have difficulty reaching the attending provider, please page the [facility] (Director on Call) for [facility] on amion for assistance.

## 2021-08-23 NOTE — Progress Notes (Signed)
[facility]  PROGRESS NOTE   Subjective:   History taken with assistance of Chief Operating Officer. Daughter at bedside.   UOP 1952mL  Creatinine 1.69 (2.02)  Objective:  Vital signs in last 24 hours:  Temp:  [97.6 F (36.4 C)-98.6 F (37 C)] 98.4 F (36.9 C) (11/18 1149) Pulse Rate:  [72-97] 72 (11/18 1149) Resp:  [18] 18 (11/18 1149) BP: (141-161)/(52-81) 150/73 (11/18 1149) SpO2:  [91 %-100 %] 97 % (11/18 1149)  Weight change:  Filed Weights   08/20/21 0631 08/21/21 0328 08/22/21 0315  Weight: 64 kg 62.8 kg 62.2 kg    Intake/Output: I/O last 3 completed shifts: In: 560 [P.O.:560] Out: 2550 [Urine:2550]   Intake/Output this shift:  No intake/output data recorded.  Physical Exam: General:  No acute distress, cachectic  Head:  Normocephalic, atraumatic. Moist oral mucosal membranes  Eyes:  Anicteric  Neck:  Supple  Lungs:   Clear to auscultation, normal effort  Heart:  regular  Abdomen:   Soft, nontender, bowel sounds present  Extremities:  Trace peripheral edema.  Neurologic:  lethargic  Skin:  No lesions        Basic Metabolic Panel: Recent Labs  Lab 08/18/21 0901 08/19/21 0457 08/20/21 0939 08/21/21 0424 08/22/21 0700 08/23/21 0512  NA 137 141  --  138 142 143  K 3.3* 3.8 3.0* 3.1*  2.9* 3.5 3.4*  CL 105 107  --  104 104 108  CO2 22 22  --  24 25 27   GLUCOSE 152* 108*  --  117* 107* 117*  BUN 131* 142*  --  128* 89* 93*  CREATININE 3.10* 3.19* 3.49* 3.18*  3.03* 2.02* 1.69*  CALCIUM 7.6* 8.0*  --  8.1* 8.1* 8.5*  MG  --   --   --  2.2 2.0 2.1  PHOS  --   --   --  5.1*  --   --      CBC: Recent Labs  Lab 08/18/21 0901 08/20/21 1511 08/22/21 0700 08/23/21 0512  WBC 13.6* 9.7 8.4 8.6  NEUTROABS  --   --  7.2 7.5  HGB 8.2* 8.1* 8.7* 8.4*  HCT 25.2* 25.2* 26.8* 26.1*  MCV 84.8 84.3 85.9 87.0  PLT 234 202 232 249      Urinalysis: No results for input(s): COLORURINE, LABSPEC, PHURINE, GLUCOSEU, HGBUR, BILIRUBINUR,  KETONESUR, PROTEINUR, UROBILINOGEN, NITRITE, LEUKOCYTESUR in the last 72 hours.  Invalid input(s): APPERANCEUR    Imaging: DG Chest Port 1 View  Result Date: 08/23/2021 CLINICAL DATA:  Shortness of breath EXAM: PORTABLE CHEST 1 VIEW COMPARISON:  Previous studies including the examination of 08/19/2021 FINDINGS: Transverse diameter of heart is increased. Extensive interstitial and alveolar densities seen in both lungs with interval worsening. There is minimal blunting of lateral CP angles. There is no pneumothorax. There is previous coronary bypass surgery. IMPRESSION: Extensive interstitial and alveolar densities seen in both lungs with interval worsening. Findings suggest worsening pulmonary edema or bilateral pneumonia. Possible small bilateral pleural effusions. Electronically Signed   By: Elmer Picker M.D.   On: 08/23/2021 12:38     Medications:       feeding supplement  237 mL Oral TID BM   furosemide  40 mg Oral Daily   heparin injection (subcutaneous)  5,000 Units Subcutaneous Q8H   hydrocortisone   Rectal TID   lidocaine  1 patch Transdermal Q24H   multivitamin with minerals  1 tablet Oral Daily   nystatin  5 mL Oral QID   pantoprazole  40 mg Oral BID   polyethylene glycol  17 g Oral Daily   senna-docusate  1 tablet Oral BID   spironolactone  25 mg Oral Daily    Assessment/ Plan:     Active Problems:   Moderate major depression, single episode (HCC)   Coronary artery disease   AKI (acute kidney injury) ([place])   Normochromic normocytic anemia   Essential hypertension   Hyponatremia   Acute on chronic combined systolic and diastolic CHF (congestive heart failure) (HCC)   HLD (hyperlipidemia)   Hyperkalemia   AAA (abdominal aortic aneurysm)   Elevated troponin   Malnutrition of moderate degree  Mr. Chester Sibert is a 87 y.o. Hispanic male with hypertension, coronary artery disease status post CABG, congestive heart failure, cerebrovascular accident, who  is admitted to [facility] on 08/11/2021 for Shortness of breath [R06.02] Hyperkalemia [E87.5] Positive D dimer [R79.89] Elevated troponin [R77.8] Swelling of arm [M79.89] Generalized weakness [R53.1] AKI (acute kidney injury) ([place]) [N17.9]   Acute kidney injury on chronic kidney disease stage IV: baseline creatinine of 2.6, GFR of 24 on 07/31/2021. Followed by Dr. Lanora Manis. No acute indication for dialysis.Acute kidney injury secondary to rhabdomyolysis and acute cardiorenal syndrome. Now with concerns of overdiuresis. - discussed dialysis with patient and his family. No indication for dialysis at this time.  - restart diuretics gentle.   Hypertension and acute exacerbation of systolic and diastolic congestive heart failure.  - Discontinued furosemide, acetazolamide and metolazone.  - As above: furosemide 40mg  PO and spironolactone 25mg  daily  Anemia with chronic kidney disease: Hemoglobin 8.4. normocytic. Patient has not received ESA.   Hypokalemia: with hyperkalemia on admission.  - Potassium replacement PO   - start spironolactone  Overall prognosis is grim.     LOS: [place], [facility] 11/18/202212:57 PM

## 2021-08-23 NOTE — Assessment & Plan Note (Addendum)
Likely secondary to rhabdomyolysis. No cardiac issues.  Comfort care now

## 2021-08-23 NOTE — Progress Notes (Signed)
Physical Therapy Treatment Patient Details Name: Daniel Bolton MRN: [id number] DOB: Jul 25, 1934 Today's Date: 08/23/2021   History of Present Illness Pt is an 87 y/o M admitted on 08/11/21 after presenting to the ED with acute onset of generalized weakness with diminished appetite & oral intake. Pt with 1 fall, sliding down to the floor prior to admission. MRI results show several protruding disc in lumbar region. Pt was also found to have AKI & being treated for rhabdo with IVF. Pt started requiring HFNC & was transferred to higher level of care. PT has been re-consulted for re-evaluation. PMH: HTN, CAD s/p CABG, CHF, CKD, CVA    PT Comments    Tele -interpreter used throughout session. He is awake but lethargic. Supportive daughter at bedside and was able to answer most questions that pt was unable to. Author questions if daughter and pt understand full scope of current situation. Pt was able to correctly state he is in hospital however states its march and is unaware of why he is hospitalized. He follows simple one step commands well but is severely deconditioned. HR was stable however he gets SOB with minimal activity. He required mod assistance to progress to EOB form long sitting with increased time and constant cueing. Sat EOB with CGA assistance prior to standing 1 x to RW. Pt is severely deconditioned. Only tolerated standing ~ 1-2 minutes but did take s few steps to [facility]. Too deconditioned to attempt ambulation away from EOB. Pt's breakfast tray at bedside and only a few bites were taken. Author encouraged daughter/patient to increase food intake. All parties are in agreement that SNF at [place] is most appropriate. PT will continue to follow and progress as able per current POC.    Recommendations for follow up therapy are one component of a multi-disciplinary discharge planning process, led by the attending physician.  Recommendations may be updated based on patient status, additional  functional criteria and insurance authorization.  Follow Up Recommendations  Skilled nursing-short term rehab (<3 hours/day)     Assistance Recommended at Discharge Frequent or constant Supervision/Assistance  Equipment Recommendations  Other (comment) (defer to next level of care)       Precautions / Restrictions Precautions Precautions: Fall;Back Restrictions Weight Bearing Restrictions: No     Mobility  Bed Mobility Overal bed mobility: Needs Assistance Bed Mobility: Supine to Sit;Sit to Supine     Supine to sit: Mod assist Sit to supine: Mod assist   General bed mobility comments: Pt required increased time and mod assist to exit and return to supine(long sit)>EOB    Transfers Overall transfer level: Needs assistance Equipment used: Rolling walker (2 wheels) Transfers: Sit to/from Stand Sit to Stand: Mod assist;From elevated surface           General transfer comment: Pt stood 1 x EOB and stood ~ 1-2 minutes.very fatigued /SOB with minimal activity    Ambulation/Gait Ambulation/Gait assistance: Min assist Gait Distance (Feet): 3 Feet Assistive device: Rolling walker (2 wheels) Gait Pattern/deviations: Step-to pattern Gait velocity: decreased     General Gait Details: pt took several steps from FOB to [facility] however very fatigued and requested to sit. HR was stable but pt was SOB.     Balance Overall balance assessment: Needs assistance Sitting-balance support: Bilateral upper extremity supported;Feet supported Sitting balance-Leahy Scale: Fair Sitting balance - Comments:  (CGA-supervision with sitting EOB. lethargy greatly impacts)   Standing balance support: Bilateral upper extremity supported;During functional activity Standing balance-Leahy Scale: Fair Standing balance comment:  reliant on UE support in standing. poor standing posture throughout       Cognition Arousal/Alertness: Lethargic Behavior During Therapy: WFL for tasks  assessed/performed Overall Cognitive Status: Impaired/Different from baseline Area of Impairment: Orientation;Attention;Memory;Following commands;Safety/judgement      Current Attention Level: Focused Memory: Decreased recall of precautions;Decreased short-term memory Following Commands: Follows one step commands consistently;Follows one step commands with increased time Safety/Judgement: Decreased awareness of safety;Decreased awareness of deficits     General Comments: Pt is mostly alert but is disoriented. did know hhe was in hospital however unaware why. reports its march (actually Nov). Was however able to follow simple one step commands with increased time.               Pertinent Vitals/Pain Pain Assessment:  (did not rate or impact session progression however did state low back hurts form fall) Pain Intervention(s): Monitored during session;Repositioned     PT Goals (current goals can now be found in the care plan section) Acute Rehab PT Goals Patient Stated Goal: none stated Progress towards PT goals: Progressing toward goals    Frequency    Min 2X/week      PT Plan Current plan remains appropriate       AM-PAC PT "6 Clicks" Mobility   Outcome Measure    Help needed moving from lying on your back to sitting on the side of a flat bed without using bedrails?: A Little Help needed moving to and from a bed to a chair (including a wheelchair)?: A Lot Help needed standing up from a chair using your arms (e.g., wheelchair or bedside chair)?: A Lot Help needed to walk in hospital room?: A Lot Help needed climbing 3-5 steps with a railing? : Total 6 Click Score: 10    End of Session Equipment Utilized During Treatment: Oxygen Activity Tolerance: Patient limited by fatigue;Patient limited by lethargy;Other (comment) Patient left: in bed;with call bell/phone within reach;with bed alarm set Nurse Communication: Mobility status PT Visit Diagnosis: Other  abnormalities of gait and mobility (R26.89);Muscle weakness (generalized) (M62.81);Difficulty in walking, not elsewhere classified (R26.2);Unsteadiness on feet (R26.81)     Time: 2376-2831 PT Time Calculation (min) (ACUTE ONLY): 27 min  Charges:  $Therapeutic Activity: 23-37 mins                     Julaine Fusi PTA 08/23/21, 9:33 AM

## 2021-08-23 NOTE — Assessment & Plan Note (Signed)
No statin.

## 2021-08-23 NOTE — Assessment & Plan Note (Addendum)
Patient made DNR and full comfort care initiated.  Son and family are in agreement.  I have requested hospice referral with possible hospice home placement if he is eligible.  His prognosis is less than 2 weeks

## 2021-08-23 NOTE — Care Management Important Message (Signed)
Important Message  Patient Details  Name: Daniel Bolton MRN: [id number] Date of Birth: 1933-12-09   Medicare Important Message Given:  Yes     [name] 08/23/2021, 2:22 PM

## 2021-08-23 NOTE — Assessment & Plan Note (Addendum)
Treated with nystatin.  Comfort care now

## 2021-08-23 NOTE — Hospital Course (Addendum)
11/6 admitted to the hospital. 11/7 cardiology consulted for elevated troponins.  Neurosurgery consulted for lumbar spinal stenosis.  Recommend medical stability before considering laminectomy.  Nephrology was also consulted. Treated with IV hydration. 11/9 GI and general surgery consulted for concern for worsening LFT and possible cholecystitis.  Patient was on antibiotic.  Cholecystitis ruled out. 11/11 rheumatology consulted.  Recommend no statin in future.  No need for muscle biopsy. 11/12 started on diuresis due to volume overload. 11/15 diuresis was on hold due to worsening renal function and BUN and uremia. 11/18 started on Lasix and Aldactone chest x-ray shows persistent edema/pneumonia. 11/19 full comfort care initiated.  Waiting for hospice evaluation for possible hospice on placement 11/20: Eligible for hospice but waiting on decision from agency about if he would qualify for hospice home.

## 2021-08-23 NOTE — Assessment & Plan Note (Addendum)
Secondary to rhabdomyolysis. Comfort care now

## 2021-08-23 NOTE — Assessment & Plan Note (Addendum)
Incidental.  Comfort care now

## 2021-08-23 NOTE — Plan of Care (Signed)
  Problem: Clinical Measurements: Goal: Respiratory complications will improve Outcome: Progressing   Problem: Clinical Measurements: Goal: Cardiovascular complication will be avoided Outcome: Progressing   Problem: Elimination: Goal: Will not experience complications related to bowel motility Outcome: Progressing   Problem: Elimination: Goal: Will not experience complications related to urinary retention Outcome: Progressing   Problem: Pain Managment: Goal: General experience of comfort will improve Outcome: Progressing

## 2021-08-23 NOTE — Consult Note (Signed)
                                                                                 Consultation Note Date: 08/23/2021   Patient Name: Daniel Bolton  DOB: 09/23/1934  MRN: [id number]  Age / Sex: 87 y.o., male  PCP: Bronstein, David, MD Referring Physician: Patel, Pranav M, MD  Reason for Consultation: Establishing goals of care  HPI/Patient Profile: 87 y.o. male  with past medical history of hypertension, CAD status post CABG, CHF, CKD, and CVA admitted on 08/11/2021 with complaints of acute generalized weakness with poor p.o. intake.  Patient was found to have rhabdomyolysis, metabolic encephalopathy, bilateral lower extremity weakness, and bilateral pleural effusions with concerns for pneumonia.  Palliative medicine team was consulted to discuss goals of care.   Clinical Assessment and Goals of Care: I have reviewed medical records including EPIC notes, labs and imaging, assessed the patient and then met with patient and his daughter at bedside to discuss diagnosis prognosis, GOC, EOL wishes, disposition and options.  I introduced Palliative Medicine as specialized medical care for people living with serious illness. It focuses on providing relief from the symptoms and stress of a serious illness. The goal is to improve quality of life for both the patient and the family.  We discussed a brief life review of the patient.  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  He has been married twice before and is a widower.  As far as functional and nutritional status prior to admission patient was independent with all ADLs.  He was eating, drinking, cooking all of his own meals, walking daily, and managing his own finances independently.  We discussed patient's current illness and what it means in the larger context of patient's on-going co-morbidities.  Natural disease trajectory and expectations at EOL were discussed.  His daughter shares that she has  noticed a decline since he fell and that he has not been out of pain or really acting like himself for the past several weeks.  I attempted to elicit values and goals of care important to the patient.  When asked what was important to the patient or what the goals of his care would look like he shared he is in pain and would like a couple coffee.  His daughter shared that she is spoken with his other children and that they have all agreed that if he is not able to improve with rehab and continues to not eat or drink then they are on board with having the patient transition to hospice care.  I gave a brief medical overview of the patient's current problems.  Then, the difference between aggressive medical intervention and comfort care was considered in light of the patient's goals of care.  The daughter shared that she would like to see how much of an improvement the patient will make physically.  I shared my concerns about his encephalopathy without a known origin.  She said she is concerned as well but would like some time to see what happens.  Advance directives, concepts specific to code status, artificial feeding and hydration, and rehospitalization were considered and   discussed.  The daughter shares that the patient has never made his wishes known.  She says she does not know if she would like to change his CODE STATUS right now but will discuss it with her siblings.  Discussed with patient/family the importance of continued conversation with family and the medical providers regarding overall plan of care and treatment options, ensuring decisions are within the context of the patient's values and GOCs.    Questions and concerns were addressed. The family was encouraged to call with questions or concerns.   Primary Decision Maker NEXT OF KIN  Code Status/Advance Care Planning: Full code  Prognosis:   Unable to determine  Discharge Planning: To Be Determined  Primary Diagnoses: Present on  Admission:  AAA (abdominal aortic aneurysm)  Acute on chronic combined systolic and diastolic CHF (congestive heart failure) (HCC)  Coronary artery disease  Elevated troponin  Essential hypertension  HLD (hyperlipidemia)  Hyponatremia  Hyperkalemia  Moderate major depression, single episode (HCC)  Normochromic normocytic anemia   Physical Exam Vitals and nursing note reviewed.  Constitutional:      Appearance: Normal appearance.  HENT:     Head: Normocephalic and atraumatic.     Mouth/Throat:     Mouth: Mucous membranes are moist.  Cardiovascular:     Rate and Rhythm: Normal rate.     Pulses: Normal pulses.  Pulmonary:     Effort: Pulmonary effort is normal.  Musculoskeletal:     Comments: Generalized weakness  Skin:    General: Skin is warm and dry.  Neurological:     Mental Status: He is alert and oriented to person, place, and time.  Psychiatric:        Mood and Affect: Mood normal.        Behavior: Behavior normal.        Thought Content: Thought content normal.        Judgment: Judgment normal.    Vital Signs: BP (!) 150/73 (BP Location: Right Arm)   Pulse 72   Temp 98.4 F (36.9 C)   Resp 18   Ht 5' 1" (1.549 m)   Wt 62.2 kg   SpO2 97%   BMI 25.91 kg/m  Pain Scale: 0-10   Pain Score: Asleep SpO2: SpO2: 97 % O2 Device:SpO2: 97 % O2 Flow Rate: .O2 Flow Rate (L/min): 6 L/min  Palliative Assessment/Data: 40%     I discussed this patient's plan of care with patient, patient's daughter.  Thank you for this consult. Palliative medicine will continue to follow and assist holistically.  I shared that I will be off service after today but will return Monday and continue to follow the patient throughout his hospitalization.  Time Total: 70 minutes Greater than 50%  of this time was spent counseling and coordinating care related to the above assessment and plan.  Signed by:  , DNP, FNP-BC Palliative Medicine    Please contact Palliative  Medicine Team phone at [phone] for questions and concerns.  For individual provider: See Amion               

## 2021-08-24 DIAGNOSIS — J90 Pleural effusion, not elsewhere classified: Secondary | ICD-10-CM | POA: Diagnosis not present

## 2021-08-24 DIAGNOSIS — Z7189 Other specified counseling: Secondary | ICD-10-CM | POA: Diagnosis not present

## 2021-08-24 DIAGNOSIS — I714 Abdominal aortic aneurysm, without rupture, unspecified: Secondary | ICD-10-CM | POA: Diagnosis not present

## 2021-08-24 DIAGNOSIS — N179 Acute kidney failure, unspecified: Secondary | ICD-10-CM | POA: Diagnosis not present

## 2021-08-24 LAB — CBC WITH DIFFERENTIAL/PLATELET
Abs Immature Granulocytes: 0.04 10*3/uL (ref 0.00–0.07)
Basophils Absolute: 0 10*3/uL (ref 0.0–0.1)
Basophils Relative: 0 %
Eosinophils Absolute: 0.3 10*3/uL (ref 0.0–0.5)
Eosinophils Relative: 3 %
HCT: 28 % — ABNORMAL LOW (ref 39.0–52.0)
Hemoglobin: 8.8 g/dL — ABNORMAL LOW (ref 13.0–17.0)
Immature Granulocytes: 0 %
Lymphocytes Relative: 7 %
Lymphs Abs: 0.6 10*3/uL — ABNORMAL LOW (ref 0.7–4.0)
MCH: 27.2 pg (ref 26.0–34.0)
MCHC: 31.4 g/dL (ref 30.0–36.0)
MCV: 86.4 fL (ref 80.0–100.0)
Monocytes Absolute: 0.4 10*3/uL (ref 0.1–1.0)
Monocytes Relative: 4 %
Neutro Abs: 7.7 10*3/uL (ref 1.7–7.7)
Neutrophils Relative %: 86 %
Platelets: 284 10*3/uL (ref 150–400)
RBC: 3.24 MIL/uL — ABNORMAL LOW (ref 4.22–5.81)
RDW: 16.8 % — ABNORMAL HIGH (ref 11.5–15.5)
WBC: 9.1 10*3/uL (ref 4.0–10.5)
nRBC: 0 % (ref 0.0–0.2)

## 2021-08-24 LAB — COMPREHENSIVE METABOLIC PANEL
ALT: 125 U/L — ABNORMAL HIGH (ref 0–44)
AST: 67 U/L — ABNORMAL HIGH (ref 15–41)
Albumin: 2.2 g/dL — ABNORMAL LOW (ref 3.5–5.0)
Alkaline Phosphatase: 182 U/L — ABNORMAL HIGH (ref 38–126)
Anion gap: 6 (ref 5–15)
BUN: 66 mg/dL — ABNORMAL HIGH (ref 8–23)
CO2: 29 mmol/L (ref 22–32)
Calcium: 8.8 mg/dL — ABNORMAL LOW (ref 8.9–10.3)
Chloride: 107 mmol/L (ref 98–111)
Creatinine, Ser: 1.23 mg/dL (ref 0.61–1.24)
GFR, Estimated: 58 mL/min — ABNORMAL LOW (ref >60)
Glucose, Bld: 108 mg/dL — ABNORMAL HIGH (ref 70–99)
Potassium: 3.3 mmol/L — ABNORMAL LOW (ref 3.5–5.1)
Sodium: 142 mmol/L (ref 135–145)
Total Bilirubin: 1.4 mg/dL — ABNORMAL HIGH (ref 0.3–1.2)
Total Protein: 6.4 g/dL — ABNORMAL LOW (ref 6.5–8.1)

## 2021-08-24 LAB — MAGNESIUM: Magnesium: 2 mg/dL (ref 1.7–2.4)

## 2021-08-24 MED ORDER — LORAZEPAM 2 MG/ML IJ SOLN: 2.0000 mg | INTRAMUSCULAR | Status: DC | PRN

## 2021-08-24 MED ORDER — GLYCOPYRROLATE 1 MG PO TABS
1.0000 mg | ORAL_TABLET | ORAL | Status: DC | PRN
  Filled 2021-08-24: qty 1

## 2021-08-24 MED ORDER — DIPHENHYDRAMINE HCL 50 MG/ML IJ SOLN: 25.0000 mg | INTRAMUSCULAR | Status: DC | PRN

## 2021-08-24 MED ORDER — GLYCOPYRROLATE 0.2 MG/ML IJ SOLN
0.2000 mg | INTRAMUSCULAR | Status: DC | PRN
  Filled 2021-08-24: qty 1

## 2021-08-24 MED ORDER — ACETAMINOPHEN 325 MG PO TABS: 650.0000 mg | ORAL_TABLET | Freq: Four times a day (QID) | ORAL | Status: DC | PRN

## 2021-08-24 MED ORDER — POLYVINYL ALCOHOL 1.4 % OP SOLN
1.0000 [drp] | Freq: Four times a day (QID) | OPHTHALMIC | Status: DC | PRN
  Filled 2021-08-24: qty 15

## 2021-08-24 MED ORDER — MORPHINE SULFATE (PF) 2 MG/ML IV SOLN
2.0000 mg | INTRAVENOUS | Status: DC | PRN
  Administered 2021-08-24 (×2): 2 mg via INTRAVENOUS
  Administered 2021-08-24: 4 mg via INTRAVENOUS
  Administered 2021-08-24: 2 mg via INTRAVENOUS
  Administered 2021-08-25 – 2021-08-26 (×3): 4 mg via INTRAVENOUS
  Filled 2021-08-24 (×3): qty 2
  Filled 2021-08-24: qty 1
  Filled 2021-08-24: qty 2
  Filled 2021-08-24 (×2): qty 1

## 2021-08-24 MED ORDER — DEXTROSE 5 % IV SOLN: INTRAVENOUS | Status: DC

## 2021-08-24 MED ORDER — HALOPERIDOL LACTATE 5 MG/ML IJ SOLN: 2.5000 mg | INTRAMUSCULAR | Status: DC | PRN

## 2021-08-24 MED ORDER — ACETAMINOPHEN 650 MG RE SUPP: 650.0000 mg | Freq: Four times a day (QID) | RECTAL | Status: DC | PRN

## 2021-08-24 MED ORDER — POTASSIUM CHLORIDE CRYS ER 20 MEQ PO TBCR
20.0000 meq | EXTENDED_RELEASE_TABLET | Freq: Once | ORAL | Status: AC
  Administered 2021-08-24: 20 meq via ORAL
  Filled 2021-08-24: qty 2

## 2021-08-24 NOTE — Progress Notes (Signed)
Pt with moderate size, liquid bowel movement which appeared to be bloody. Paged Sharion Settler NP to notify. Did consider collecting sample, however was unable to gather enough matter as most of the stool had soaked into the pad. Pt otherwise stable, VSS, resting comfortably after being cleaned up.

## 2021-08-24 NOTE — Progress Notes (Signed)
Progress Note    Daniel Bolton   FIE:[id number]  DOB: 11-13-1933  DOA: 08/11/2021     12 Date of Service: 08/24/2021   Clinical Course  11/6 admitted to the hospital. 11/7 cardiology consulted for elevated troponins.  Neurosurgery consulted for lumbar spinal stenosis.  Recommend medical stability before considering laminectomy.  Nephrology was also consulted. Treated with IV hydration. 11/9 GI and general surgery consulted for concern for worsening LFT and possible cholecystitis.  Patient was on antibiotic.  Cholecystitis ruled out. 11/11 rheumatology consulted.  Recommend no statin in future.  No need for muscle biopsy. 11/12 started on diuresis due to volume overload. 11/15 diuresis was on hold due to worsening renal function and BUN and uremia. 11/18 started on Lasix and Aldactone chest x-ray shows persistent edema/pneumonia. 11/19 full comfort care initiated.  Waiting for hospice evaluation for possible hospice on placement    Assessment and Plan * Acute renal failure superimposed on stage 4 chronic kidney disease ([place]) Secondary to rhabdomyolysis. Comfort care now  Counseling regarding goals of care Patient made DNR and full comfort care initiated.  Son and family are in agreement.  I have requested hospice referral with possible hospice home placement if he is eligible.  His prognosis is less than 2 weeks  Oral thrush Treated with nystatin.  Comfort care now  Dysphagia  Comfort care now  Positive D dimer  Comfort care now  Spinal stenosis of lumbar region with radiculopathy  Comfort care now  Aspiration pneumonia of both lower lobes due to gastric secretions [facility])  Comfort care now  Bilateral pleural effusion  Comfort care now  Transaminitis  Comfort care now   Non-traumatic rhabdomyolysis  Comfort care now   Acute metabolic encephalopathy  Comfort care now  Uremia  Comfort care now  Malnutrition of moderate degree  Comfort care  now  Elevated troponin Likely secondary to rhabdomyolysis. No cardiac issues.  Comfort care now  AAA (abdominal aortic aneurysm) Incidental.  Comfort care now  Hyper and hypokalemia. Comfort care now.  HLD (hyperlipidemia) No statin.  Acute on chronic combined systolic and diastolic CHF (congestive heart failure) ([place]) Comfort care now   Hyponatremia Comfort care now  Essential hypertension Comfort care now  Normochromic normocytic anemia Comfort care now  CAD (coronary artery disease), SP CABG Comfort care now     Subjective:  Son at bedside.  Patient just moaning.  No new issues.  Son speaks English  Objective Vitals:   08/24/21 0442 08/24/21 0745 08/24/21 0748 08/24/21 1115  BP:  (!) 159/59  (!) 170/77  Pulse:  77  77  Resp:  18 20 18   Temp:  98.7 F (37.1 C)  98.7 F (37.1 C)  TempSrc:    Oral  SpO2:  100%  100%  Weight: 59.4 kg     Height:       59.4 kg  Vital signs were reviewed and unremarkable except for: Blood pressure: Elevated    Exam Physical Exam   General:  87 year old male looking very cachectic Skin, no Rash; HEENT oral Mucosa shows thrush. no Abnormal Neck Mass Or lumps, Conjunctiva normal  Cardiovascular: S1 and S2 Present, no Murmur, Respiratory: Clear to auscultation bilaterally Abdomen: Bowel Sound present, Soft and no tenderness Extremities: no Pedal edema Neurology:  Obtunded  Labs / Other Information My review of labs, imaging, notes and other tests is significant for Low potassium, albumin, elevated LFTs, low hemoglobin     Disposition Plan: Status is: Inpatient  Remains  inpatient appropriate because: Awaiting hospice evaluation for possible hospice home placement versus home with hospice   Full comfort care.  Discussed with patient's son and daughter at bedside   Time spent: 25 minutes [facility] 08/24/2021, 2:12 PM

## 2021-08-24 NOTE — TOC Progression Note (Signed)
Transition of Care [facility]) - Progression Note    Patient Details  Name: Daniel Bolton MRN: [id number] Date of Birth: [birth year]  Transition of Care [facility]) CM/SW [place], [place] Phone Number: 08/24/2021, 10:48 AM  Clinical Narrative:     CSW notes plan is now for hospice home, CSW has sent referral to [facility]. They are processing referral and will let CSW know once approved and update on bed availability.   Expected Discharge Plan: [place] Barriers to Discharge: Continued Medical Work up  Expected Discharge Plan and Services Expected Discharge Plan: [place]   Discharge Planning Services: CM Consult Post Acute Care Choice: [place] Living arrangements for the past 2 months: Single Family Home                 DME Arranged: N/A DME Agency: NA       HH Arranged: NA           Social Determinants of Health (SDOH) Interventions    Readmission Risk Interventions Readmission Risk Prevention Plan 08/13/2021 06/18/2021  Transportation Screening Complete Complete  Medication Review Press photographer) Complete Complete  PCP or Specialist appointment within 3-5 days of discharge Complete Complete  HRI or Home Care Consult Complete Complete  SW Recovery Care/Counseling Consult Complete Complete  Palliative Care Screening Not Applicable Not [place] Complete Not Applicable  Some recent data might be hidden

## 2021-08-24 NOTE — Progress Notes (Signed)
  Chaplain On-Call responded to [place] Order from Max Sane, MD: "End of Life".  The patient is Spanish-speaking only. Chaplain was assisted greatly by RN Earlie Server and Futures trader.  Chaplain learned from the patient's family at bedside that the patient has been visited and supported by Father Evette Doffing of [employer].  Chaplain assured the patient and family that Chaplains are available for additional support as needed.  Chaplain Pollyann Samples M.Div., [facility]

## 2021-08-24 NOTE — Progress Notes (Signed)
[facility]  PROGRESS NOTE   Subjective:   History taken with assistance of Chief Operating Officer. Family members at bedside.   UOP 1445mL  Creatinine 1.23 (1.69) (2.02)  Report of a bloody bowel movement this morning.   Objective:  Vital signs in last 24 hours:  Temp:  [98.2 F (36.8 C)-98.7 F (37.1 C)] 98.7 F (37.1 C) (11/19 0745) Pulse Rate:  [72-80] 77 (11/19 0745) Resp:  [18-20] 20 (11/19 0748) BP: (150-171)/(59-78) 159/59 (11/19 0745) SpO2:  [96 %-100 %] 100 % (11/19 0745) Weight:  [59.4 kg] 59.4 kg (11/19 0442)  Weight change:  Filed Weights   08/21/21 0328 08/22/21 0315 08/24/21 0442  Weight: 62.8 kg 62.2 kg 59.4 kg    Intake/Output: I/O last 3 completed shifts: In: 200 [P.O.:200] Out: 1350 [Urine:1350]   Intake/Output this shift:  Total I/O In: -  Out: 1400 [Urine:1400]  Physical Exam: General:  No acute distress, cachectic,   Head:  Normocephalic, atraumatic. Moist oral mucosal membranes  Eyes:  Anicteric  Neck:  Supple  Lungs:   Clear to auscultation, normal effort  Heart:  regular  Abdomen:   Soft, nontender, bowel sounds present  Extremities:  No peripheral edema.  Neurologic:  lethargic  Skin:  No lesions        Basic Metabolic Panel: Recent Labs  Lab 08/19/21 0457 08/20/21 0939 08/21/21 0424 08/22/21 0700 08/23/21 0512 08/24/21 0527  NA 141  --  138 142 143 142  K 3.8 3.0* 3.1*  2.9* 3.5 3.4* 3.3*  CL 107  --  104 104 108 107  CO2 22  --  24 25 27 29   GLUCOSE 108*  --  117* 107* 117* 108*  BUN 142*  --  128* 89* 93* 66*  CREATININE 3.19* 3.49* 3.18*  3.03* 2.02* 1.69* 1.23  CALCIUM 8.0*  --  8.1* 8.1* 8.5* 8.8*  MG  --   --  2.2 2.0 2.1 2.0  PHOS  --   --  5.1*  --   --   --      CBC: Recent Labs  Lab 08/18/21 0901 08/20/21 1511 08/22/21 0700 08/23/21 0512 08/24/21 0527  WBC 13.6* 9.7 8.4 8.6 9.1  NEUTROABS  --   --  7.2 7.5 7.7  HGB 8.2* 8.1* 8.7* 8.4* 8.8*  HCT 25.2* 25.2* 26.8* 26.1* 28.0*  MCV  84.8 84.3 85.9 87.0 86.4  PLT 234 202 232 249 284      Urinalysis: No results for input(s): COLORURINE, LABSPEC, PHURINE, GLUCOSEU, HGBUR, BILIRUBINUR, KETONESUR, PROTEINUR, UROBILINOGEN, NITRITE, LEUKOCYTESUR in the last 72 hours.  Invalid input(s): APPERANCEUR    Imaging: DG Chest Port 1 View  Result Date: 08/23/2021 CLINICAL DATA:  Shortness of breath EXAM: PORTABLE CHEST 1 VIEW COMPARISON:  Previous studies including the examination of 08/19/2021 FINDINGS: Transverse diameter of heart is increased. Extensive interstitial and alveolar densities seen in both lungs with interval worsening. There is minimal blunting of lateral CP angles. There is no pneumothorax. There is previous coronary bypass surgery. IMPRESSION: Extensive interstitial and alveolar densities seen in both lungs with interval worsening. Findings suggest worsening pulmonary edema or bilateral pneumonia. Possible small bilateral pleural effusions. Electronically Signed   By: Elmer Picker M.D.   On: 08/23/2021 12:38     Medications:    dextrose        hydrocortisone   Rectal TID   lidocaine  1 patch Transdermal Q24H   nystatin  5 mL Oral QID   polyethylene glycol  17 g Oral Daily   senna-docusate  1 tablet Oral BID    Assessment/ Plan:     Principal Problem:   Acute renal failure superimposed on stage 4 chronic kidney disease (HCC) Active Problems:   CAD (coronary artery disease), SP CABG   Normochromic normocytic anemia   Essential hypertension   Hyponatremia   Acute on chronic combined systolic and diastolic CHF (congestive heart failure) (HCC)   HLD (hyperlipidemia)   Hyper and hypokalemia.   AAA (abdominal aortic aneurysm)   Elevated troponin   Malnutrition of moderate degree   Uremia   Acute metabolic encephalopathy   Non-traumatic rhabdomyolysis   Transaminitis   Bilateral pleural effusion   Aspiration pneumonia of both lower lobes due to gastric secretions (HCC)   Spinal stenosis of  lumbar region with radiculopathy   Positive D dimer   Dysphagia   Oral thrush   Counseling regarding goals of care  Daniel Bolton is a 87 y.o. Hispanic male with hypertension, coronary artery disease status post CABG, congestive heart failure, cerebrovascular accident, who is admitted to [facility] on 08/11/2021 for Shortness of breath [R06.02] Hyperkalemia [E87.5] Positive D dimer [R79.89] Elevated troponin [R77.8] Swelling of arm [M79.89] Generalized weakness [R53.1] AKI (acute kidney injury) ([place]) [N17.9]   Acute kidney injury on chronic kidney disease stage IV: baseline creatinine of 2.6, GFR of 24 on 07/31/2021. Followed by Dr. Lanora Manis. No acute indication for dialysis.Acute kidney injury secondary to rhabdomyolysis and acute cardiorenal syndrome. Now with concerns of overdiuresis. - Not a candidate for long term dailysis - Continue furosemide and spironolactone  Hypertension and acute exacerbation of systolic and diastolic congestive heart failure.  - Discontinued metolazone. Do not recommend restarting at this time.  - As above: furosemide 40mg  PO and spironolactone 25mg  daily  Anemia with chronic kidney disease: Hemoglobin 8.8. normocytic. Patient has not received ESA.   Hypokalemia: with hyperkalemia on admission.  - Potassium replacement PO   - Continue spironolactone  Overall prognosis is grim.     LOS: [place], [facility] 11/19/202210:30 AM

## 2021-08-25 DIAGNOSIS — Z7189 Other specified counseling: Secondary | ICD-10-CM | POA: Diagnosis not present

## 2021-08-25 DIAGNOSIS — I714 Abdominal aortic aneurysm, without rupture, unspecified: Secondary | ICD-10-CM | POA: Diagnosis not present

## 2021-08-25 DIAGNOSIS — J90 Pleural effusion, not elsewhere classified: Secondary | ICD-10-CM | POA: Diagnosis not present

## 2021-08-25 DIAGNOSIS — N179 Acute kidney failure, unspecified: Secondary | ICD-10-CM | POA: Diagnosis not present

## 2021-08-25 NOTE — Assessment & Plan Note (Signed)
Comfort care now °

## 2021-08-25 NOTE — Assessment & Plan Note (Signed)
Treated with nystatin.  Comfort care now

## 2021-08-25 NOTE — Assessment & Plan Note (Signed)
Incidental.  Comfort care now

## 2021-08-25 NOTE — Assessment & Plan Note (Signed)
Secondary to rhabdomyolysis.Comfort care now

## 2021-08-25 NOTE — Plan of Care (Signed)
  Problem: Health Behavior/Discharge Planning: Goal: Ability to manage health-related needs will improve Outcome: Progressing   Problem: Clinical Measurements: Goal: Ability to maintain clinical measurements within normal limits will improve Outcome: Progressing Goal: Respiratory complications will improve Outcome: Progressing Goal: Cardiovascular complication will be avoided Outcome: Progressing   

## 2021-08-25 NOTE — Assessment & Plan Note (Signed)
No statin.

## 2021-08-25 NOTE — Assessment & Plan Note (Signed)
Patient made DNR and full comfort care initiated.  Son and family are in agreement.  I have requested hospice referral with possible hospice home placement if he is eligible.  His prognosis is less than 2 weeks

## 2021-08-25 NOTE — Plan of Care (Signed)

## 2021-08-25 NOTE — Progress Notes (Signed)
Progress Note    Daniel Bolton   GQQ:[id number]  DOB: June 04, 1934  DOA: 08/11/2021     13 Date of Service: 08/25/2021   Clinical Course  11/6 admitted to the hospital. 11/7 cardiology consulted for elevated troponins.  Neurosurgery consulted for lumbar spinal stenosis.  Recommend medical stability before considering laminectomy.  Nephrology was also consulted. Treated with IV hydration. 11/9 GI and general surgery consulted for concern for worsening LFT and possible cholecystitis.  Patient was on antibiotic.  Cholecystitis ruled out. 11/11 rheumatology consulted.  Recommend no statin in future.  No need for muscle biopsy. 11/12 started on diuresis due to volume overload. 11/15 diuresis was on hold due to worsening renal function and BUN and uremia. 11/18 started on Lasix and Aldactone chest x-ray shows persistent edema/pneumonia. 11/19 full comfort care initiated.  Waiting for hospice evaluation for possible hospice on placement 11/20: Eligible for hospice but waiting on decision from agency about if he would qualify for hospice home.   Assessment and Plan * Acute renal failure superimposed on stage 4 chronic kidney disease ([place]) Secondary to rhabdomyolysis.Comfort care now  Counseling regarding goals of care Patient made DNR and full comfort care initiated.  Son and family are in agreement.  I have requested hospice referral with possible hospice home placement if he is eligible.  His prognosis is less than 2 weeks  Oral thrush Treated with nystatin.  Comfort care now  Dysphagia  Comfort care now  Positive D dimer  Comfort care now  Spinal stenosis of lumbar region with radiculopathy  Comfort care now  Aspiration pneumonia of both lower lobes due to gastric secretions [facility])  Comfort care now  Bilateral pleural effusion  Comfort care now  Transaminitis  Comfort care now   Non-traumatic rhabdomyolysis  Comfort care now   Acute metabolic encephalopathy   Comfort care now  Uremia  Comfort care now  Malnutrition of moderate degree  Comfort care now  Elevated troponin Likely secondary to rhabdomyolysis. No cardiac issues.  Comfort care now  AAA (abdominal aortic aneurysm) Incidental.  Comfort care now  Hyper and hypokalemia. Comfort care now.  HLD (hyperlipidemia) No statin.  Acute on chronic combined systolic and diastolic CHF (congestive heart failure) ([place]) Comfort care now   Hyponatremia Comfort care now  Essential hypertension Comfort care now  Normochromic normocytic anemia Comfort care now  CAD (coronary artery disease), SP CABG Comfort care now.     Subjective:  Seems comfortable, 2 daughters at bedside  Objective Vitals:   08/24/21 0748 08/24/21 1115 08/24/21 2219 08/25/21 0759  BP:  (!) 170/77 (!) 152/64 (!) 142/61  Pulse:  77 74 72  Resp: 20 18 20 18   Temp:  98.7 F (37.1 C) 97.8 F (36.6 C) 98.1 F (36.7 C)  TempSrc:  Oral Axillary   SpO2:  100% 99% 100%  Weight:      Height:       59.4 kg  Vital signs were reviewed and unremarkable.   Exam Physical Exam   General: 87 year old male looking very cachectic Skin, noRash; HEENT oral Mucosa shows thrush. no Abnormal Neck Mass Or lumps, Conjunctiva normal  Cardiovascular:S1 and S2 Present, noMurmur, Respiratory: Clear to auscultation bilaterally Abdomen:Bowel Sound present, Soft and notenderness Extremities:noPedal edema Neurology:  Awake  Labs / Other Information There are no new results to review at this time.   Disposition Plan: Status is: Inpatient  Remains inpatient appropriate because: Waiting for hospice evaluation to decide hospice home eligibility   Updated  patient's 2 daughter at bedside and granddaughter who lives in [place] over phone   Possible discharge in next 1 to 2 days.  This patient can be moved to any MedSurg  Time spent: 35 minutes [facility] 08/25/2021, 11:38 AM

## 2021-08-25 NOTE — TOC Progression Note (Signed)
Transition of Care [facility]) - Progression Note    Patient Details  Name: Daniel Bolton MRN: [id number] Date of Birth: Jun 19, 1934  Transition of Care [facility]) CM/SW [place], LCSW Phone Number: 08/25/2021, 9:52 AM  Clinical Narrative: Spoke with [place] at [facility]. Patient is approved for hospice but they have to do a face-to-face assessment to determine if he meets criteria for hospice facility vs home with hospice. This will occur tomorrow.    Expected Discharge Plan: [place] Barriers to Discharge: Continued Medical Work up  Expected Discharge Plan and Services Expected Discharge Plan: [place]   Discharge Planning Services: CM Consult Post Acute Care Choice: [place] Living arrangements for the past 2 months: Single Family Home                 DME Arranged: N/A DME Agency: NA       HH Arranged: NA           Social Determinants of Health (SDOH) Interventions    Readmission Risk Interventions Readmission Risk Prevention Plan 08/13/2021 06/18/2021  Transportation Screening Complete Complete  Medication Review Press photographer) Complete Complete  PCP or Specialist appointment within 3-5 days of discharge Complete Complete  HRI or Home Care Consult Complete Complete  SW Recovery Care/Counseling Consult Complete Complete  Palliative Care Screening Not Applicable Not [place] Complete Not Applicable  Some recent data might be hidden

## 2021-08-25 NOTE — Assessment & Plan Note (Signed)
Likely secondary to rhabdomyolysis. No cardiac issues.  Comfort care now

## 2021-08-26 DIAGNOSIS — K921 Melena: Secondary | ICD-10-CM | POA: Diagnosis present

## 2021-08-26 NOTE — Assessment & Plan Note (Signed)
Patient made DNR and full comfort care initiated.  Son and family are in agreement.requested.  His prognosis is less than 2 weeks

## 2021-08-26 NOTE — Assessment & Plan Note (Signed)
Secondary to rhabdomyolysis. AST was 1000 ALT was 500 on admission

## 2021-08-26 NOTE — Progress Notes (Signed)
Nutrition Brief Note  Chart reviewed. Pt now transitioning to comfort care.  No further nutrition interventions planned at this time.  Please re-consult as needed.   Naeem Quillin W, RD, LDN, CDCES Registered Dietitian II Certified Diabetes Care and Education Specialist Please refer to [facility] for RD and/or RD on-call/weekend/after hours pager   

## 2021-08-26 NOTE — Assessment & Plan Note (Signed)
Comfort care now °

## 2021-08-26 NOTE — Progress Notes (Signed)
Report called to Santiago Glad at [facility]. Pt will be going to facility with IV in place. Pt family at bedside.

## 2021-08-26 NOTE — Progress Notes (Signed)
OT Cancellation Note  Patient Details Name: Daniel Bolton MRN: [id number] DOB: 23-Feb-1934   Cancelled Treatment:    Reason Eval/Treat Not Completed: Other (comment) Chart review this AM indicates that comfort measures have been initiated for this patient.  Will complete OT order at this time. Thank you for consulting OT in the care of this patient.  Gerrianne Scale, [place], OTR/L ascom [phone] 08/26/21, 8:27 AM

## 2021-08-26 NOTE — Progress Notes (Addendum)
[facility] [facility]) Hospital Liaison Note   Received request from Transitions of Care Manager for family interest in [place]. Chart reviewed and spoke with family to acknowledge referral. Patient hospice eligibility status is pending.   Dependent upon eligibility, there will be continued conversations of bed availability of Hospice Home. Family and Transitions of Care Manager aware hospital liaison will follow up tomorrow or sooner if room is available. Please do not hesitate to call with questions.    Please call with any questions/concerns.    Thank you for the opportunity to participate in this patient's care.   Addendum  1:30 PM Patient has been approved for [facility]. MSW has contacted [facility] staff and is waiting for confirmation of bed availability. Family/TOC/MD notified of above updates.  2:30 PM [facility] notified of patient D/C and transport arranged. TOC/Elena and Attending Physician/Dr. Posey Pronto also notified of transport arrangement.   Please send signed DNR form with patient and RN call report to [place], MSW [facility] Liaison  [phone]

## 2021-08-26 NOTE — Assessment & Plan Note (Signed)
moderate size, liquid bowel movement which appeared to be bloody, on 11/19. Pt switched to comfort care after that due to multiple co morbidities.

## 2021-08-26 NOTE — Assessment & Plan Note (Signed)
Treated with nystatin.  Comfort care now

## 2021-08-26 NOTE — Assessment & Plan Note (Signed)
Secondary to rhabdomyolysis.Comfort care now

## 2021-08-26 NOTE — Assessment & Plan Note (Addendum)
Appears to be encephalopathic from gabapentin, cefepime as well as uremia. Patient is positive for asterixis. BUN trending improving. The cefepime was discontinued. This renders very poor prognosis due to ongoing dysphagia as well as pain requirement.

## 2021-08-26 NOTE — Assessment & Plan Note (Signed)
Incidental.  Comfort care now

## 2021-08-26 NOTE — Discharge Summary (Signed)
Physician Discharge Summary   Patient name: Daniel Bolton  Admit date:     08/11/2021  Discharge date: 08/26/2021  Discharge Physician: Berle Mull   PCP: [name], MD   Recommendations at discharge: establish care with Palliative care.   Discharge Diagnoses Principal Problem:   Acute renal failure superimposed on stage 4 chronic kidney disease (HCC) Active Problems:   Uremia   Acute metabolic encephalopathy   Non-traumatic rhabdomyolysis   Transaminitis   Acute on chronic combined systolic and diastolic CHF (congestive heart failure) (HCC)   Bilateral pleural effusion   Spinal stenosis of lumbar region with radiculopathy   Hyper and hypokalemia.   Elevated troponin   Aspiration pneumonia of both lower lobes due to gastric secretions (HCC)   Positive D dimer   Dysphagia   HLD (hyperlipidemia)   Normochromic normocytic anemia   Essential hypertension   Hyponatremia   Oral thrush   Malnutrition of moderate degree   Counseling regarding goals of care   Hematochezia   CAD (coronary artery disease), SP CABG   AAA (abdominal aortic aneurysm)   Resolved Diagnoses Resolved Problems:   * No resolved hospital problems. [facility]   11/6 admitted to the hospital. 11/7 cardiology consulted for elevated troponins.  Neurosurgery consulted for lumbar spinal stenosis.  Recommend medical stability before considering laminectomy.  Nephrology was also consulted. Treated with IV hydration. 11/9 GI and general surgery consulted for concern for worsening LFT and possible cholecystitis.  Patient was on antibiotic.  Cholecystitis ruled out. 11/11 rheumatology consulted.  Recommend no statin in future.  No need for muscle biopsy. 11/12 started on diuresis due to volume overload. 11/15 diuresis was on hold due to worsening renal function and BUN and uremia. 11/18 started on Lasix and Aldactone chest x-ray shows persistent edema/pneumonia. 11/19 full comfort care  initiated.  Waiting for hospice evaluation for possible hospice on placement 11/20: Eligible for hospice but waiting on decision from agency about if he would qualify for hospice home.   * Acute renal failure superimposed on stage 4 chronic kidney disease ([place]) Secondary to rhabdomyolysis.Comfort care now  Acute metabolic encephalopathy Appears to be encephalopathic from gabapentin, cefepime as well as uremia. Patient is positive for asterixis. BUN trending improving.  The cefepime was discontinued. This renders very poor prognosis due to ongoing dysphagia as well as pain requirement.  Uremia  Comfort care now  Transaminitis Secondary to rhabdomyolysis. AST was 1000 ALT was 500 on admission  Non-traumatic rhabdomyolysis  Comfort care now   Spinal stenosis of lumbar region with radiculopathy  Neurosurgery was consulted.  Due to generalized weakness.  Recommended laminectomy outpatient once medical issues are resolved.  Pain control remains an issue for the patient Comfort care now  Bilateral pleural effusion  Comfort care now  Acute on chronic combined systolic and diastolic CHF (congestive heart failure) ([place]) Comfort care now   Dysphagia  Comfort care now  Positive D dimer  Comfort care now  Aspiration pneumonia of both lower lobes due to gastric secretions (HCC) Treated with cefepime and Zosyn. Currently remains with dysphagia and encephalopathy Comfort care now  Elevated troponin Likely secondary to rhabdomyolysis. No cardiac issues.  Comfort care now  Hyper and hypokalemia. Comfort care now.  HLD (hyperlipidemia) No statin.  Oral thrush Treated with nystatin.  Comfort care now  Hyponatremia Comfort care now  Essential hypertension Comfort care now  Normochromic normocytic anemia Comfort care now  Hematochezia moderate size, liquid bowel movement which appeared  to be bloody, on 11/19. Pt switched to comfort care after that due to multiple co  morbidities.   Counseling regarding goals of care Patient made DNR and full comfort care initiated.  Son and family are in agreement.requested.  His prognosis is less than 2 weeks  Malnutrition of moderate degree  Comfort care now  CAD (coronary artery disease), SP CABG Was on aspirin and Plavix.  Held in the setting of GI bleed. Comfort care now.  AAA (abdominal aortic aneurysm) Incidental.  Comfort care now   Procedures performed: none   Condition at discharge: stable  Exam General: Appear in mild distress, no Rash; Oral Mucosa Clear, moist. no Abnormal Neck Mass Or lumps, Conjunctiva normal  Cardiovascular: S1 and S2 Present, no Murmur, Respiratory: good respiratory effort, Bilateral Air entry present and CTA, no Crackles, no wheezes Abdomen: Bowel Sound present, Soft and no tenderness Extremities: no Pedal edema Neurology: alert and oriented to person affect appropriate. no new focal deficit Gait not checked due to patient safety concerns  positive asterixis.    Disposition: Hospice care  Discharge time: greater than 30 minutes.   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        Medication List     STOP taking these medications    amLODipine 10 MG tablet Commonly known as: NORVASC   feeding supplement Liqd   furosemide 40 MG tablet Commonly known as: LASIX   metoprolol succinate 50 MG 24 hr tablet Commonly known as: TOPROL-XL   olmesartan 20 MG tablet Commonly known as: BENICAR   pantoprazole 40 MG tablet Commonly known as: PROTONIX   simvastatin 80 MG tablet Commonly known as: ZOCOR        MR CERVICAL SPINE WO CONTRAST  Addendum Date: 08/13/2021   ADDENDUM REPORT: 08/13/2021 15:23 IMPRESSION: 6.  Moderate right and trace left pleural  effusions. Electronically Signed   By: Merilyn Baba M.D.   On: 08/13/2021 15:23   Result Date: 08/13/2021 CLINICAL DATA:  Upper extremity weakness, incontinence, leg numbness EXAM: MRI CERVICAL AND THORACIC SPINE WITHOUT CONTRAST TECHNIQUE: Multiplanar and multiecho pulse sequences of the cervical spine, to include the craniocervical junction and cervicothoracic junction, and the thoracic spine, were obtained without intravenous contrast. COMPARISON:  No prior MRI, correlation is made with CT cervical spine 06/14/2021 FINDINGS: MRI CERVICAL SPINE FINDINGS Alignment: Unchanged trace anterolisthesis C4 on C5 and C5 on C6. Vertebrae: No acute fracture or suspicious osseous lesion. Congenitally short pedicles, which narrow the AP diameter of the spinal canal. Cord: Normal signal and morphology. Posterior Fossa, vertebral arteries, paraspinal tissues: Negative. Disc levels: C2-C3: No significant disc bulge. Facet arthropathy and right-greater-than-left uncovertebral hypertrophy. No spinal canal stenosis. Mild right neural foraminal narrowing. C3-C4: Central disc extrusion with 4 mm of caudal migration, which indents the ventral spinal cord. Facet arthropathy. Mild to moderate bilateral neural foraminal narrowing. Moderate spinal canal stenosis. C4-C5: Disc bulge with superimposed central protrusion, which indents the ventral cord. Ligamentum flavum hypertrophy. Left greater than right facet and uncovertebral hypertrophy. Moderate to severe spinal canal stenosis. Moderate bilateral neural foraminal narrowing. C5-C6: Mild anterolisthesis and disc unroofing. Facet arthropathy. Mild bilateral neural foraminal narrowing. No spinal canal stenosis. C6-C7: Minimal disc bulge. Left-greater-than-right uncovertebral hypertrophy. Facet arthropathy. No spinal canal  stenosis. Moderate bilateral neural foraminal narrowing. C7-T1: No significant disc bulge. Facet arthropathy. No spinal canal stenosis. Mild bilateral neural foraminal  narrowing. MRI THORACIC SPINE FINDINGS Alignment:  No listhesis. Vertebrae: No acute fracture or suspicious osseous lesion. Cord:  Normal in signal and morphology. Paraspinal and other soft tissues: Moderate right and trace left pleural effusion. Disc levels: Mild degenerative changes without significant spinal canal stenosis or high-grade neural foraminal narrowing. IMPRESSION: 1. No evidence of cord signal abnormality in the cervical or thoracic spine. 2. C4-C5 moderate to severe spinal canal stenosis and moderate bilateral neural foraminal narrowing. 3. C3-C4 moderate spinal canal stenosis and mild-to-moderate bilateral neural foraminal narrowing. 4. C6-C7 moderate bilateral neural foraminal narrowing. 5. No significant spinal canal stenosis or neural foraminal narrowing in the thoracic spine. Electronically Signed: By: Merilyn Baba M.D. On: 08/13/2021 15:19   MR THORACIC SPINE WO CONTRAST  Addendum Date: 08/13/2021   ADDENDUM REPORT: 08/13/2021 15:23 IMPRESSION: 6.  Moderate right and trace left pleural effusions. Electronically Signed   By: Merilyn Baba M.D.   On: 08/13/2021 15:23   Result Date: 08/13/2021 CLINICAL DATA:  Upper extremity weakness, incontinence, leg numbness EXAM: MRI CERVICAL AND THORACIC SPINE WITHOUT CONTRAST TECHNIQUE: Multiplanar and multiecho pulse sequences of the cervical spine, to include the craniocervical junction and cervicothoracic junction, and the thoracic spine, were obtained without intravenous contrast. COMPARISON:  No prior MRI, correlation is made with CT cervical spine 06/14/2021 FINDINGS: MRI CERVICAL SPINE FINDINGS Alignment: Unchanged trace anterolisthesis C4 on C5 and C5 on C6. Vertebrae: No acute fracture or suspicious osseous lesion. Congenitally short pedicles, which narrow the AP diameter of the spinal canal. Cord: Normal signal and morphology. Posterior Fossa, vertebral arteries, paraspinal tissues: Negative. Disc levels: C2-C3: No significant disc bulge.  Facet arthropathy and right-greater-than-left uncovertebral hypertrophy. No spinal canal stenosis. Mild right neural foraminal narrowing. C3-C4: Central disc extrusion with 4 mm of caudal migration, which indents the ventral spinal cord. Facet arthropathy. Mild to moderate bilateral neural foraminal narrowing. Moderate spinal canal stenosis. C4-C5: Disc bulge with superimposed central protrusion, which indents the ventral cord. Ligamentum flavum hypertrophy. Left greater than right facet and uncovertebral hypertrophy. Moderate to severe spinal canal stenosis. Moderate bilateral neural foraminal narrowing. C5-C6: Mild anterolisthesis and disc unroofing. Facet arthropathy. Mild bilateral neural foraminal narrowing. No spinal canal stenosis. C6-C7: Minimal disc bulge. Left-greater-than-right uncovertebral hypertrophy. Facet arthropathy. No spinal canal stenosis. Moderate bilateral neural foraminal narrowing. C7-T1: No significant disc bulge. Facet arthropathy. No spinal canal stenosis. Mild bilateral neural foraminal narrowing. MRI THORACIC SPINE FINDINGS Alignment:  No listhesis. Vertebrae: No acute fracture or suspicious osseous lesion. Cord:  Normal in signal and morphology. Paraspinal and other soft tissues: Moderate right and trace left pleural effusion. Disc levels: Mild degenerative changes without significant spinal canal stenosis or high-grade neural foraminal narrowing. IMPRESSION: 1. No evidence of cord signal abnormality in the cervical or thoracic spine. 2. C4-C5 moderate to severe spinal canal stenosis and moderate bilateral neural foraminal narrowing. 3. C3-C4 moderate spinal canal stenosis and mild-to-moderate bilateral neural foraminal narrowing. 4. C6-C7 moderate bilateral neural foraminal narrowing. 5. No significant spinal canal stenosis or neural foraminal narrowing in the thoracic spine. Electronically Signed: By: Merilyn Baba M.D. On: 08/13/2021 15:19   MR LUMBAR SPINE WO CONTRAST  Result  Date: 08/12/2021 CLINICAL DATA:  Acute onset of generalized weakness beginning 3 days ago EXAM: MRI LUMBAR SPINE WITHOUT CONTRAST TECHNIQUE: Multiplanar, multisequence MR imaging of the lumbar spine was performed. No intravenous contrast was administered. COMPARISON:  None. FINDINGS: Segmentation:  5 lumbar type vertebral bodies assumed. Alignment: Minimal scoliotic curvature. 2 mm degenerative anterolisthesis L4-5. Vertebrae:  No fracture or focal bone lesion. Conus medullaris and cauda equina: Conus extends to the L1-2 level. Conus and cauda equina appear normal. Paraspinal and other soft tissues: Negative Disc levels: T12-L1: Normal L1-2: Very tiny central disc protrusion with slight caudal down turning in the midline. No significant compressive effect upon the canal. Foramina widely patent. L2-3: Bulging of the disc. Mild facet and ligamentous hypertrophy. Mild stenosis of both lateral recesses, not likely compressive. L3-4: Shallow protrusion of the disc. Facet and ligamentous hypertrophy. Moderate multifactorial stenosis at this level that could cause neural compression on either or both sides. L4-5: Chronic facet arthritis with 2 mm of degenerative anterolisthesis. Large broad-based disc herniation with upward migration of disc material centrally and towards the left. Severe spinal stenosis at this level could cause neural compression on either or both sides. The upper migrated disc material on the left could focally compress the left L4 nerve. L5-S1: Endplate osteophytes and bulging of the disc. Facet and ligamentous hypertrophy. Stenosis of both subarticular lateral recesses that could compress either or both S1 nerves. IMPRESSION: At L4-5, there is facet arthropathy with 2 mm of anterolisthesis. There is a broad-based disc herniation with upward migration of extruded disc material centrally and towards the left behind L4. Severe spinal stenosis at the disc level could cause neural compression on either or  both sides. The upwardly migrated disc material could focally compress the left L4 nerve. L3-4: Shallow disc protrusion. Facet and ligamentous hypertrophy. Moderate multifactorial stenosis that could cause neural compression on either or both sides. L5-S1: Bulging of the disc. Facet and ligamentous hypertrophy. Bilateral subarticular lateral recess narrowing that could possibly affect either S1 nerve. L2-3: Disc bulge. Mild facet hypertrophy. Mild narrowing of the lateral recesses without likely neural compression. Electronically Signed   By: Nelson Chimes M.D.   On: 08/12/2021 12:49   NM Hepatobiliary Liver Func  Result Date: 08/15/2021 CLINICAL DATA:  Abdominal pain.  Question cholecystitis. EXAM: NUCLEAR MEDICINE HEPATOBILIARY IMAGING TECHNIQUE: Sequential images of the abdomen were obtained. Gallbladder activity was not confidently identified after 45 minutes. Therefore, the patient was given 2 mg of IV morphine and follow-up imaging was obtained. RADIOPHARMACEUTICALS:  4.73 mCi Tc-49m  Choletec IV COMPARISON:  Ultrasound 08/14/2021 FINDINGS: Prompt uptake and biliary excretion of activity by the liver. After 45 minutes, there was activity in the small bowel but no definite gallbladder uptake. Following the administration of IV morphine, there was uptake in the gallbladder. Gallbladder ejection fraction was not calculated. IMPRESSION: Gallbladder uptake following the administration of IV morphine. Cystic duct is patent. Electronically Signed   By: Markus Daft M.D.   On: 08/15/2021 11:15   NM Pulmonary Perfusion  Result Date: 08/12/2021 CLINICAL DATA:  PE suspected.  Positive D-dimer. EXAM: NUCLEAR MEDICINE PERFUSION LUNG SCAN TECHNIQUE: Perfusion images were obtained in multiple projections after intravenous injection of radiopharmaceutical. Ventilation scans intentionally deferred if perfusion scan and chest x-ray adequate for interpretation during COVID 19 epidemic. RADIOPHARMACEUTICALS:  4.4 mCi Tc-46m  MAA IV COMPARISON:  Chest x-ray 08/12/2021. FINDINGS: Multiplanar perfusion imaging shows scattered small areas of peripheral decreased perfusion in the mid and lower lungs bilaterally. Chest x-ray from earlier today shows patchy basilar airspace disease, left greater than right with probable left effusion. IMPRESSION: Study is indeterminate for the presence of acute pulmonary embolus. Electronically Signed   By: Verda Cumins.D.  On: 08/12/2021 13:28   US RENAL  Result Date: 08/14/2021 CLINICAL DATA:  Renal failure EXAM: RENAL / URINARY TRACT ULTRASOUND COMPLETE COMPARISON:  Renal ultrasound 08/12/2021 FINDINGS: Right Kidney: Renal measurements: 8.8 x 4.2 x 4.4 cm = volume: 85 mL. Increased echogenicity with no hydronephrosis. Left Kidney: Renal measurements: 8.3 x 3.8 x 3.5 cm = volume: 57 mL. Mildly increased echogenicity. No hydronephrosis. Bladder: Appears normal for degree of bladder distention. Other: Prostate gland is enlarged with nodular protrusion into the base of the urinary bladder. Right pleural effusion. IMPRESSION: 1. Increased echogenicity of the kidneys suggesting medical renal disease. No hydronephrosis. 2. Prostatomegaly. 3. Right pleural effusion. Electronically Signed   By: Ofilia Neas M.D.   On: 08/14/2021 12:07   US RENAL  Result Date: 08/12/2021 CLINICAL DATA:  Acute renal insufficiency. EXAM: RENAL / URINARY TRACT ULTRASOUND COMPLETE COMPARISON:  None. FINDINGS: Right Kidney: Renal measurements: 10.1 x 4.0 x 4.6 cm = volume: 96 mL. 6 there is diffuse increased renal parenchymal echogenicity. No hydronephrosis or shadowing stone. Left Kidney: Renal measurements: 9.1 x 4.8 x 3.3 cm = volume: 75 mL. Diffuse increased renal parenchymal echogenicity. No hydronephrosis or shadowing stone Bladder: Appears normal for degree of bladder distention. Other: The prostate gland measures 4.7 x 3.8 x 4.8 cm. Partially visualized right pleural effusion. There is coarsened appearance of the  liver parenchyma. A trace perihepatic free fluid may be present. IMPRESSION: 1. Echogenic kidneys, likely related to medical renal disease. No hydronephrosis or shadowing stone. 2. Partially visualized right pleural effusion. Electronically Signed   By: Anner Crete M.D.   On: 08/12/2021 03:44   MR ABDOMEN MRCP WO CONTRAST  Result Date: 08/17/2021 CLINICAL DATA:  87 year old male with history of elevated liver function tests. Elevated alkaline phosphatase level. EXAM: MRI ABDOMEN WITHOUT CONTRAST  (INCLUDING MRCP) TECHNIQUE: Multiplanar multisequence MR imaging of the abdomen was performed. Heavily T2-weighted images of the biliary and pancreatic ducts were obtained, and three-dimensional MRCP images were rendered by post processing. COMPARISON:  No prior abdominal MRI. CT of the abdomen and pelvis 06/04/2021. Abdominal ultrasound 08/14/2021. Hepatobiliary scan 08/15/2021. FINDINGS: Comment: Portions of today's study is limited by considerable patient motion. Additionally, study is limited for detection and characterization of visceral and/or vascular lesions by lack of IV gadolinium. Lower chest: Large right and small left pleural effusions. Cardiomegaly. Hepatobiliary: No definite suspicious cystic or solid hepatic lesions are confidently identified on today's noncontrast examination. Filling defect in the gallbladder, compatible with known gallstone. Gallbladder is moderately distended. Gallbladder wall thickness is normal. No pericholecystic fluid. No intra or extrahepatic biliary ductal dilatation noted on MRCP images. Common bile duct measures 7 mm within the porta hepatis (this is within normal limits for the patient's advanced age). Pancreas: No definite pancreatic mass or peripancreatic fluid collections or inflammatory changes noted on today's noncontrast examination. No pancreatic ductal dilatation noted on MRCP images. Spleen:  Unremarkable. Adrenals/Urinary Tract: Bilateral kidneys and adrenal  glands are unremarkable in appearance. No hydroureteronephrosis in the visualized portions of the abdomen. Stomach/Bowel: Visualized portions are unremarkable. Vascular/Lymphatic: Extensive aortic atherosclerosis with infrarenal abdominal aortic aneurysm measuring 4.3 x 4.0 cm. No definite lymphadenopathy confidently identified in the abdomen on today's noncontrast examination. Other:  Trace volume of ascites. Musculoskeletal: No aggressive appearing osseous lesions are noted in the visualized portions of the skeleton. IMPRESSION: 1. Cholelithiasis without evidence of acute cholecystitis. 2. No evidence of choledocholithiasis or findings to suggest biliary tract obstruction. 3. Trace volume of ascites. 4. Large right and  small left pleural effusions. 5. Cardiomegaly. Electronically Signed   By: Vinnie Langton M.D.   On: 08/17/2021 08:53   US Venous Img Lower Bilateral (DVT)  Result Date: 08/12/2021 CLINICAL DATA:  Positive D-dimer concern for DVT EXAM: BILATERAL LOWER EXTREMITY VENOUS DOPPLER ULTRASOUND TECHNIQUE: Gray-scale sonography with graded compression, as well as color Doppler and duplex ultrasound were performed to evaluate the lower extremity deep venous systems from the level of the common femoral vein and including the common femoral, femoral, profunda femoral, popliteal and calf veins including the posterior tibial, peroneal and gastrocnemius veins when visible. The superficial great saphenous vein was also interrogated. Spectral Doppler was utilized to evaluate flow at rest and with distal augmentation maneuvers in the common femoral, femoral and popliteal veins. COMPARISON:  None. FINDINGS: RIGHT LOWER EXTREMITY Common Femoral Vein: No evidence of thrombus. Normal compressibility, respiratory phasicity and response to augmentation. Saphenofemoral Junction: No evidence of thrombus. Normal compressibility and flow on color Doppler imaging. Profunda Femoral Vein: No evidence of thrombus. Normal  compressibility and flow on color Doppler imaging. Femoral Vein: No evidence of thrombus. Normal compressibility, respiratory phasicity and response to augmentation. Popliteal Vein: No evidence of thrombus. Normal compressibility, respiratory phasicity and response to augmentation. Calf Veins: No evidence of thrombus. Normal compressibility and flow on color Doppler imaging. Superficial Great Saphenous Vein: No evidence of thrombus. Normal compressibility. Venous Reflux:  None. Other Findings:  None. LEFT LOWER EXTREMITY Common Femoral Vein: No evidence of thrombus. Normal compressibility, respiratory phasicity and response to augmentation. Saphenofemoral Junction: No evidence of thrombus. Normal compressibility and flow on color Doppler imaging. Profunda Femoral Vein: No evidence of thrombus. Normal compressibility and flow on color Doppler imaging. Femoral Vein: No evidence of thrombus. Normal compressibility, respiratory phasicity and response to augmentation. Popliteal Vein: No evidence of thrombus. Normal compressibility, respiratory phasicity and response to augmentation. Calf Veins: No evidence of thrombus. Normal compressibility and flow on color Doppler imaging. Superficial Great Saphenous Vein: No evidence of thrombus. Normal compressibility. Venous Reflux:  None. Other Findings:  None. IMPRESSION: No evidence of deep venous thrombosis in either lower extremity. Electronically Signed   By: Dahlia Bailiff M.D.   On: 08/12/2021 19:18   US Venous Img Upper Uni Right(DVT)  Result Date: 08/12/2021 CLINICAL DATA:  Right upper extremity swelling. EXAM: Right UPPER EXTREMITY VENOUS DOPPLER ULTRASOUND TECHNIQUE: Gray-scale sonography with graded compression, as well as color Doppler and duplex ultrasound were performed to evaluate the upper extremity deep venous system from the level of the subclavian vein and including the jugular, axillary, basilic, radial, ulnar and upper cephalic vein. Spectral Doppler was  utilized to evaluate flow at rest and with distal augmentation maneuvers. COMPARISON:  None. FINDINGS: Contralateral Subclavian Vein: Respiratory phasicity is normal and symmetric with the symptomatic side. No evidence of thrombus. Normal compressibility. Internal Jugular Vein: No evidence of thrombus. Normal compressibility, respiratory phasicity and response to augmentation. Subclavian Vein: No evidence of thrombus. Normal compressibility, respiratory phasicity and response to augmentation. Axillary Vein: No evidence of thrombus. Normal compressibility, respiratory phasicity and response to augmentation. Cephalic Vein: No evidence of thrombus. Normal compressibility, respiratory phasicity and response to augmentation. Basilic Vein: No evidence of thrombus. Normal compressibility, respiratory phasicity and response to augmentation. Brachial Veins: No evidence of thrombus. Normal compressibility, respiratory phasicity and response to augmentation. Radial Veins: No evidence of thrombus. Normal compressibility, respiratory phasicity and response to augmentation. Ulnar Veins: No evidence of thrombus. Normal compressibility, respiratory phasicity and response to augmentation. Venous Reflux:  None visualized.  Other Findings: There is diffuse subcutaneous edema of the right upper extremity. IMPRESSION: No evidence of DVT within the right upper extremity. Electronically Signed   By: Anner Crete M.D.   On: 08/12/2021 01:00   DG Chest Port 1 View  Result Date: 08/23/2021 CLINICAL DATA:  Shortness of breath EXAM: PORTABLE CHEST 1 VIEW COMPARISON:  Previous studies including the examination of 08/19/2021 FINDINGS: Transverse diameter of heart is increased. Extensive interstitial and alveolar densities seen in both lungs with interval worsening. There is minimal blunting of lateral CP angles. There is no pneumothorax. There is previous coronary bypass surgery. IMPRESSION: Extensive interstitial and alveolar densities  seen in both lungs with interval worsening. Findings suggest worsening pulmonary edema or bilateral pneumonia. Possible small bilateral pleural effusions. Electronically Signed   By: Elmer Picker M.D.   On: 08/23/2021 12:38   DG Chest Port 1 View  Result Date: 08/19/2021 CLINICAL DATA:  Weakness EXAM: PORTABLE CHEST 1 VIEW COMPARISON:  08/12/2021 FINDINGS: Diffuse hazy opacification of the chest, progressed. There is history of CHF and this could be pulmonary edema, but pneumonia is also considered. Small if any pleural fluid. Cardiomegaly with prior CABG. IMPRESSION: Worsening generalized airspace disease. Electronically Signed   By: Jorje Guild M.D.   On: 08/19/2021 11:48   DG Chest Port 1 View  Result Date: 08/12/2021 CLINICAL DATA:  Shortness of breath EXAM: PORTABLE CHEST 1 VIEW COMPARISON:  Chest x-ray 06/14/2021 FINDINGS: Heart is enlarged. Mediastinum appears stable. Calcified plaques in the aortic arch. Pulmonary vascular congestion. Increased opacities at the left lung base may represent a small effusion. No pneumothorax visualized. IMPRESSION: Cardiomegaly, pulmonary vascular congestion and possible small left pleural effusion. Electronically Signed   By: Ofilia Neas M.D.   On: 08/12/2021 08:00   US Abdomen Limited RUQ (LIVER/GB)  Result Date: 08/14/2021 CLINICAL DATA:  Elevated liver function studies. EXAM: ULTRASOUND ABDOMEN LIMITED RIGHT UPPER QUADRANT COMPARISON:  CT scan 06/04/2021 FINDINGS: Gallbladder: Echogenic shadowing gallstones are noted in the gallbladder. The largest calculus measures 1.3 cm. There is also mild gallbladder wall thickening and pericholecystic fluid. Common bile duct: Diameter: 9.0 mm Liver: Normal echogenicity without focal lesion or biliary dilatation. Portal vein is patent on color Doppler imaging with normal direction of blood flow towards the liver. Other: Large right pleural effusion is noted. IMPRESSION: 1. Cholelithiasis and findings  suspicious for acute cholecystitis. 2. Dilated common bile duct could suggest an obstructing common bile duct stone. MRI abdomen/MRCP may be helpful for further evaluation. 3. Right pleural effusion. Electronically Signed   By: Marijo Sanes M.D.   On: 08/14/2021 09:43   Results for orders placed or performed during the hospital encounter of 08/11/21  Resp Panel by RT-PCR (Flu A&B, Covid) Nasopharyngeal Swab     Status: None   Collection Time: 08/12/21 12:03 AM   Specimen: Nasopharyngeal Swab; Nasopharyngeal(NP) swabs in vial transport medium  Result Value Ref Range Status   SARS Coronavirus 2 by RT PCR NEGATIVE NEGATIVE Final    Comment: (NOTE) SARS-CoV-2 target nucleic acids are NOT DETECTED.  The SARS-CoV-2 RNA is generally detectable in upper respiratory specimens during the acute phase of infection. The lowest concentration of SARS-CoV-2 viral copies this assay can detect is 138 copies/mL. A negative result does not preclude SARS-Cov-2 infection and should not be used as the sole basis for treatment or other patient management decisions. A negative result may occur with  improper specimen collection/handling, submission of specimen other than nasopharyngeal swab, presence of viral mutation(s)  within the areas targeted by this assay, and inadequate number of viral copies(<138 copies/mL). A negative result must be combined with clinical observations, patient history, and epidemiological information. The expected result is Negative.  Fact Sheet for Patients:  EntrepreneurPulse.com.au  Fact Sheet for Healthcare Providers:  IncredibleEmployment.be  This test is no t yet approved or cleared by the [place] FDA and  has been authorized for detection and/or diagnosis of SARS-CoV-2 by FDA under an Emergency Use Authorization (EUA). This EUA will remain  in effect (meaning this test can be used) for the duration of the COVID-19 declaration under  Section 564(b)(1) of the Act, 21 U.S.C.section 360bbb-3(b)(1), unless the authorization is terminated  or revoked sooner.       Influenza A by PCR NEGATIVE NEGATIVE Final   Influenza B by PCR NEGATIVE NEGATIVE Final    Comment: (NOTE) The Xpert Xpress SARS-CoV-2/FLU/RSV plus assay is intended as an aid in the diagnosis of influenza from Nasopharyngeal swab specimens and should not be used as a sole basis for treatment. Nasal washings and aspirates are unacceptable for Xpert Xpress SARS-CoV-2/FLU/RSV testing.  Fact Sheet for Patients: EntrepreneurPulse.com.au  Fact Sheet for Healthcare Providers: IncredibleEmployment.be  This test is not yet approved or cleared by the [place] FDA and has been authorized for detection and/or diagnosis of SARS-CoV-2 by FDA under an Emergency Use Authorization (EUA). This EUA will remain in effect (meaning this test can be used) for the duration of the COVID-19 declaration under Section 564(b)(1) of the Act, 21 U.S.C. section 360bbb-3(b)(1), unless the authorization is terminated or revoked.  Performed at [facility], [address] [place], [place] [address]     Signed:  Berle Mull MD.  [facility] 08/26/2021, 2:23 PM

## 2021-08-26 NOTE — Assessment & Plan Note (Signed)
Treated with cefepime and Zosyn. Currently remains with dysphagia and encephalopathy Comfort care now

## 2021-08-26 NOTE — Assessment & Plan Note (Signed)
Likely secondary to rhabdomyolysis. No cardiac issues.  Comfort care now

## 2021-08-26 NOTE — TOC Progression Note (Signed)
Transition of Care [facility]) - Progression Note    Patient Details  Name: Daniel Bolton MRN: [id number] Date of Birth: November 28, 1933  Transition of Care [facility]) CM/SW [place], RN Phone Number: 08/26/2021, 2:23 PM  Clinical Narrative:   As per Lorayne Bender at [facility], patient will transfer to [facility] this afternoon.  [facility] notified patient and family and will contact EMS for transfer.    Expected Discharge Plan: [place] Barriers to Discharge: Continued Medical Work up  Expected Discharge Plan and Services Expected Discharge Plan: [place]   Discharge Planning Services: CM Consult Post Acute Care Choice: [place] Living arrangements for the past 2 months: Single Family Home Expected Discharge Date: 08/26/21               DME Arranged: N/A DME Agency: NA       HH Arranged: NA           Social Determinants of Health (SDOH) Interventions    Readmission Risk Interventions Readmission Risk Prevention Plan 08/13/2021 06/18/2021  Transportation Screening Complete Complete  Medication Review Press photographer) Complete Complete  PCP or Specialist appointment within 3-5 days of discharge Complete Complete  HRI or Home Care Consult Complete Complete  SW Recovery Care/Counseling Consult Complete Complete  Palliative Care Screening Not Applicable Not [place] Complete Not Applicable  Some recent data might be hidden

## 2021-08-26 NOTE — Care Management Important Message (Signed)
Important Message  Patient Details  Name: Lomax Poehler MRN: [id number] Date of Birth: 03-17-1934   Medicare Important Message Given:  Other (see comment)  [facility] is [facility] today. Out of respect for the patient and family no Important Message from [facility] given.   Juliann Pulse A Lorrie Strauch 08/26/2021, 2:47 PM

## 2021-08-26 NOTE — Assessment & Plan Note (Signed)
Neurosurgery was consulted.  Due to generalized weakness.  Recommended laminectomy outpatient once medical issues are resolved.  Pain control remains an issue for the patient Comfort care now

## 2021-08-26 NOTE — Assessment & Plan Note (Signed)
No statin.

## 2021-08-26 NOTE — Assessment & Plan Note (Addendum)
Was on aspirin and Plavix.  Held in the setting of GI bleed. Comfort care now.

## 2021-08-26 NOTE — Progress Notes (Signed)
PT Cancellation Note  Patient Details Name: Espn Zeman MRN: [id number] DOB: Mar 22, 1934   Cancelled Treatment:    Reason Eval/Treat Not Completed: Other (comment) Per chart review, comfort measures have been initiated. PT to complete current orders at this time.   Lavone Nian, PT, DPT 08/26/21, 8:21 AM    Waunita Schooner 08/26/2021, 8:20 AM

## 2021-08-28 LAB — MISC LABCORP TEST (SEND OUT): Labcorp test code: 520310

## 2021-09-02 DIAGNOSIS — U071 COVID-19: Secondary | ICD-10-CM | POA: Diagnosis not present

## 2021-09-05 DEATH — deceased

## 2021-09-11 NOTE — Progress Notes (Deleted)
Patient ID: Daniel Bolton, male    DOB: Feb 06, 1934, 87 y.o.   MRN: [id number]  [facility] interpreter present during entire visit  HPI  Daniel Bolton is a 87 y/o male with a history of HTN, CKD, stroke, hyponatremia and chronic heart failure.    Echo report from 04/10/21 reviewed and showed an EF of 25-30% along with mild LVH, mild/moderate Daniel/ TR and mild AR. Echo report from 01/14/21 reviewed and showed an EF of 50% along with mild LVH.   Admitted 08/11/21 due to         Cardiology, neurosurgery, surgery, GI, rheumatology and palliative care consults obtained.          Discharged after 15 days.       Admitted 06/04/21 due to acute on chronic HF and HTN. Bradycardic in the 40's. Cardiology and vascular consults obtained. Initially given IV lasix and transitioned to oral diuretics. Toprol dose decreased. AAA had increased with plan for outpatient f/u. Discharged after 2 days. Was in the ED 05/16/21 due to a fall where he was evaluated and released. Admitted 04/10/21 due to shortness of breath and pedal edema. Initially given IV lasix with transition to oral diuretics. Cardiology consult obtained. Diuretics held due to hyponatremia. Discharged after 4 days. Admitted 03/21/21 due to acute on chronic HF. Initially given IV lasix with transition to oral diuretics. Cardiology consult obtained. Electrolytes corrected. Discharged after 4 days.   He presents today for a follow-up visit with a chief complaint of minimal shortness of breath upon moderate exertion. He describes this as chronic in nature having been present for several years. He has associated fatigue, decreased appetite and dizziness along with this. He denies any difficulty sleeping, abdominal distention, palpitations, pedal edema, chest pain, cough or weight gain.   Past Medical History:  Diagnosis Date   CHF (congestive heart failure) ([place])    Chronic kidney disease    Hypertension    Hyponatremia    Stroke [facility])    Past Surgical History:   Procedure Laterality Date   BRAIN SURGERY     CARDIAC SURGERY     ESOPHAGOGASTRODUODENOSCOPY N/A 06/15/2021   Procedure: ESOPHAGOGASTRODUODENOSCOPY (EGD);  Surgeon: Annamaria Helling, DO;  Location[facility] ENDOSCOPY;  Service: Gastroenterology;  Laterality: N/A;   Family History  Problem Relation Age of Onset   Cancer Sister    Heart disease Sister    Social History   Tobacco Use   Smoking status: Never   Smokeless tobacco: Never  Substance Use Topics   Alcohol use: No   Allergies  Allergen Reactions   Statins Other (See Comments)    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   Prior to Admission medications   Medication Sig Start Date End Date Taking? Authorizing Provider  amLODipine (NORVASC) 10 MG tablet Take 1 tablet (10 mg total) by mouth daily. 06/07/21  Yes Jennye Boroughs, MD  aspirin EC 81 MG EC tablet Take 1 tablet (81 mg total) by mouth daily. Swallow whole. 03/14/21  Yes Danford, Suann Larry, MD  clopidogrel (PLAVIX) 75 MG tablet Take 75 mg by mouth daily.   Yes [provider]  feeding supplement (ENSURE ENLIVE / ENSURE PLUS) LIQD Take 237 mLs by mouth 2 (two) times daily between meals. 04/14/21  Yes Enzo Bi, MD  furosemide (LASIX) 40 MG tablet Take 1 tablet (40  mg total) by mouth daily. 04/14/21 07/13/21 Yes Enzo Bi, MD  metoprolol succinate (TOPROL-XL) 50 MG 24 hr tablet Take 0.5 tablets (25 mg total) by mouth daily. Take with or immediately following a meal. 06/06/21  Yes Jennye Boroughs, MD  olmesartan (BENICAR) 20 MG tablet Take 20 mg by mouth daily.   Yes [provider]  simvastatin (ZOCOR) 80 MG tablet Take 80 mg by mouth daily.   Yes [provider]   Review of Systems  Constitutional:  Positive for appetite change (decreased) and fatigue.  Eyes: Negative.   Respiratory:  Positive for shortness of breath.  Negative for cough and chest tightness.   Cardiovascular:  Negative for chest pain, palpitations and leg swelling.  Gastrointestinal:  Negative for abdominal distention and abdominal pain.  Endocrine: Negative.   Genitourinary: Negative.   Musculoskeletal:  Negative for back pain and neck pain.  Skin: Negative.   Allergic/Immunologic: Negative.   Neurological:  Positive for dizziness (at times). Negative for light-headedness.  Hematological:  Negative for adenopathy. Does not bruise/bleed easily.  Psychiatric/Behavioral:  Negative for dysphoric mood and sleep disturbance (sleeping on 2 pillows). The patient is not nervous/anxious.    There were no vitals filed for this visit.  Wt Readings from Last 3 Encounters:  08/26/21 126 lb 8.7 oz (57.4 kg)  06/16/21 129 lb 3 oz (58.6 kg)  06/13/21 129 lb 4 oz (58.6 kg)   Lab Results  Component Value Date   CREATININE 1.23 08/24/2021   CREATININE 1.69 (H) 08/23/2021   CREATININE 2.02 (H) 08/22/2021    Physical Exam Vitals reviewed. Exam conducted with a chaperone present (granddaughter).  Constitutional:      Appearance: Normal appearance.  HENT:     Head: Normocephalic and atraumatic.  Cardiovascular:     Rate and Rhythm: Regular rhythm. Bradycardia present.  Pulmonary:     Effort: Pulmonary effort is normal. No respiratory distress.     Breath sounds: No wheezing or rales.  Abdominal:     General: There is no distension.     Palpations: Abdomen is soft.  Musculoskeletal:        General: No tenderness.     Cervical back: Normal range of motion and neck supple.     Right lower leg: Edema (trace) present.     Left lower leg: Edema (trace) present.  Skin:    General: Skin is warm and dry.  Neurological:     General: No focal deficit present.     Mental Status: He is alert and oriented to person, place, and time.  Psychiatric:        Mood and Affect: Mood normal.        Behavior: Behavior normal.        Thought Content: Thought  content normal.    Assessment & Plan:  1: Chronic heart failure with preserved ejection fraction with structural changes (mild LVH)- - NYHA class II - euvolemic today - weighing daily; reminded to weigh every morning and call for an overnight weight gain of > 2 pounds or a weekly weight gain of > 5 pounds - weight down 5 pounds from last visit here 5 weeks ago - not adding salt  - saw cardiology (Fath) 06/11/21; returns Nov  - saw pulmonology Lanney Gins) 04/04/21; returns next month - BNP 06/04/21 was 3266.0  2: HTN with CKD- - BP minimally elevated today; says that it's running lower than this at home - saw PCP Lovie Macadamia) 05/14/21 - BMP 06/06/21 reviewed and showed  sodium 132, potassium 3.7, creatinine 1.64 and GFR 41 - saw nephrology Lanora Manis) 05/29/21  3: Hyponatremia- - appears to be chronic   Medication bottles reviewed.   Return in 3 months or sooner for any questions/problems before then.

## 2021-09-12 ENCOUNTER — Ambulatory Visit: Payer: Medicare HMO | Admitting: Family

## 2022-02-12 IMAGING — DX DG CHEST 1V PORT
1 series · 1 of 1 positions shown · non-contrast
Comparison: 08/12/2021

CLINICAL DATA: Weakness

EXAM:
PORTABLE CHEST 1 VIEW

[chest ap]
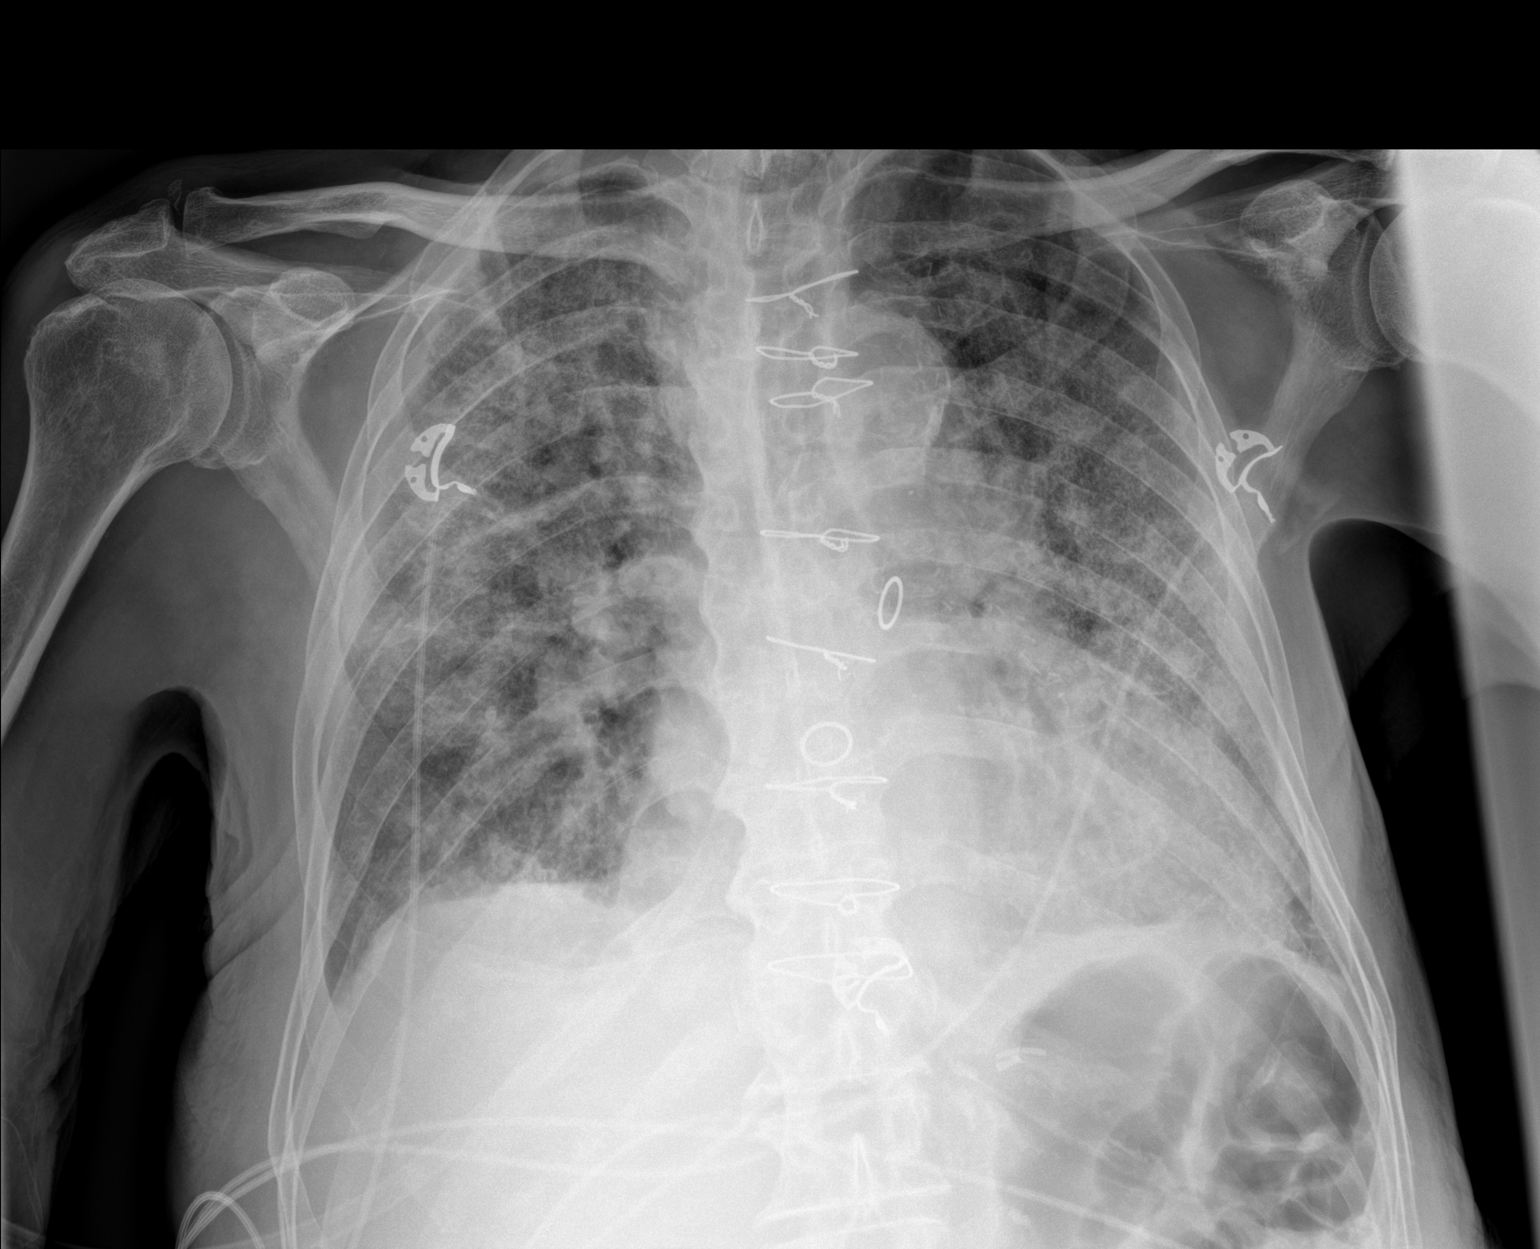

[1 of 1 positions shown; findings below may reference images not displayed]

FINDINGS: Diffuse hazy opacification of the chest, progressed. There is
history of CHF and this could be pulmonary edema, but pneumonia is
also considered. Small if any pleural fluid. Cardiomegaly with prior
CABG.
IMPRESSION: Worsening generalized airspace disease.

## 2022-02-16 IMAGING — DX DG CHEST 1V PORT
1 series · 1 of 1 positions shown · non-contrast
Comparison: Previous studies including the examination of
08/19/2021

CLINICAL DATA: Shortness of breath

EXAM:
PORTABLE CHEST 1 VIEW

[chest ap]
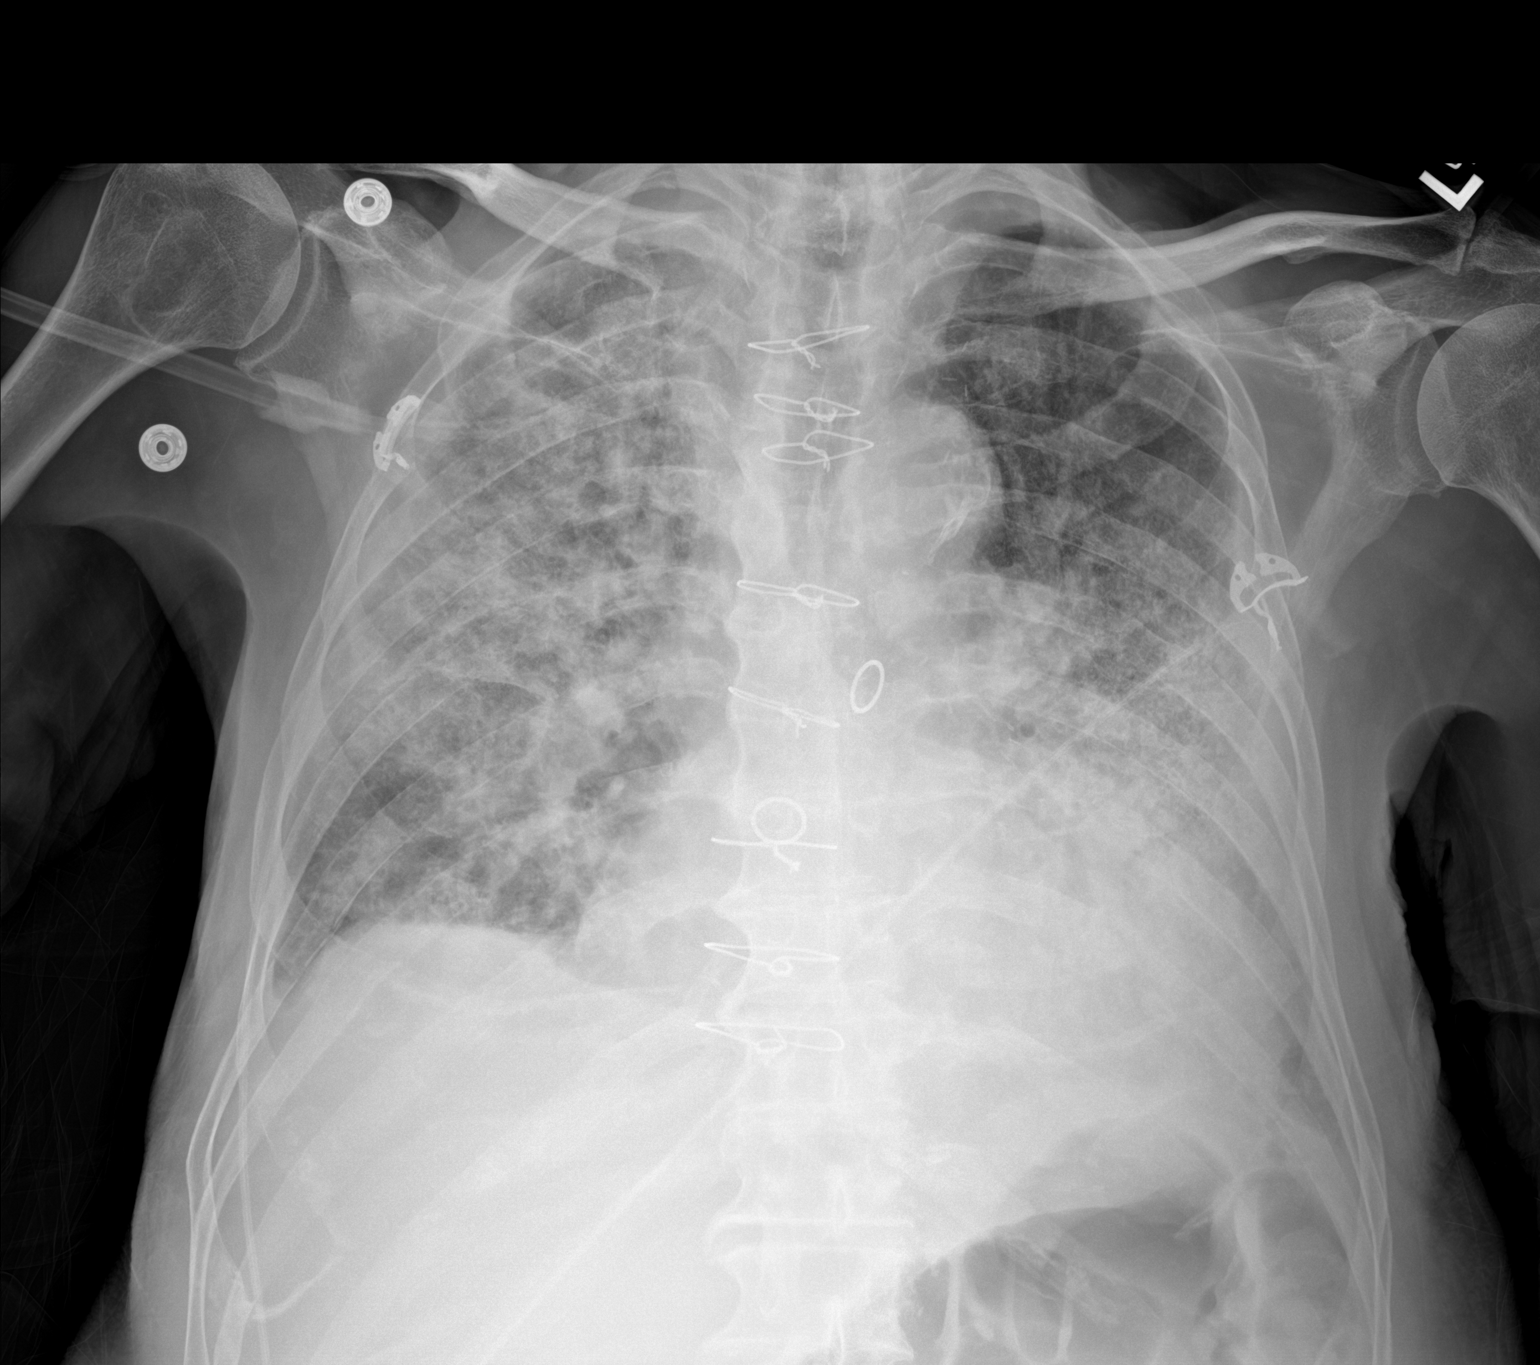

[1 of 1 positions shown; findings below may reference images not displayed]

FINDINGS: Transverse diameter of heart is increased. Extensive interstitial
and alveolar densities seen in both lungs with interval worsening.
There is minimal blunting of lateral CP angles. There is no
pneumothorax. There is previous coronary bypass surgery.
IMPRESSION: Extensive interstitial and alveolar densities seen in both lungs
with interval worsening. Findings suggest worsening pulmonary edema
or bilateral pneumonia. Possible small bilateral pleural effusions.
